# Patient Record
Sex: Female | Born: 1935 | Race: White | Hispanic: No | Smoking: Current some day smoker
Health system: Southern US, Community
[De-identification: ages and names within clinical notes are randomized; demographics above are authoritative.]

## PROBLEM LIST (undated history)

## (undated) DIAGNOSIS — E785 Hyperlipidemia, unspecified: Secondary | ICD-10-CM

## (undated) DIAGNOSIS — I639 Cerebral infarction, unspecified: Secondary | ICD-10-CM

## (undated) DIAGNOSIS — E039 Hypothyroidism, unspecified: Secondary | ICD-10-CM

## (undated) HISTORY — PX: ABDOMINAL HYSTERECTOMY: SUR658

## (undated) HISTORY — DX: Cerebral infarction, unspecified: I63.9

## (undated) HISTORY — PX: CHOLECYSTECTOMY: SHX55

## (undated) HISTORY — PX: APPENDECTOMY: SHX54

---

## 1998-08-05 ENCOUNTER — Ambulatory Visit (HOSPITAL_COMMUNITY): Admission: RE | Admit: 1998-08-05 | Discharge: 1998-08-05 | Payer: Self-pay

## 1999-03-22 ENCOUNTER — Other Ambulatory Visit: Admission: RE | Admit: 1999-03-22 | Discharge: 1999-03-22 | Payer: Self-pay | Admitting: Internal Medicine

## 1999-08-19 ENCOUNTER — Encounter: Payer: Self-pay | Admitting: Internal Medicine

## 1999-08-19 ENCOUNTER — Ambulatory Visit (HOSPITAL_COMMUNITY): Admission: RE | Admit: 1999-08-19 | Discharge: 1999-08-19 | Payer: Self-pay | Admitting: Internal Medicine

## 2000-03-29 ENCOUNTER — Other Ambulatory Visit: Admission: RE | Admit: 2000-03-29 | Discharge: 2000-03-29 | Payer: Self-pay | Admitting: *Deleted

## 2000-08-21 ENCOUNTER — Ambulatory Visit (HOSPITAL_COMMUNITY): Admission: RE | Admit: 2000-08-21 | Discharge: 2000-08-21 | Payer: Self-pay | Admitting: *Deleted

## 2001-06-29 ENCOUNTER — Encounter (INDEPENDENT_AMBULATORY_CARE_PROVIDER_SITE_OTHER): Payer: Self-pay | Admitting: Specialist

## 2001-06-29 ENCOUNTER — Ambulatory Visit (HOSPITAL_COMMUNITY): Admission: RE | Admit: 2001-06-29 | Discharge: 2001-06-29 | Payer: Self-pay | Admitting: *Deleted

## 2001-09-25 ENCOUNTER — Ambulatory Visit (HOSPITAL_COMMUNITY): Admission: RE | Admit: 2001-09-25 | Discharge: 2001-09-25 | Payer: Self-pay | Admitting: *Deleted

## 2002-01-27 ENCOUNTER — Emergency Department (HOSPITAL_COMMUNITY): Admission: EM | Admit: 2002-01-27 | Discharge: 2002-01-27 | Payer: Self-pay | Admitting: Emergency Medicine

## 2002-01-27 ENCOUNTER — Encounter: Payer: Self-pay | Admitting: Emergency Medicine

## 2002-10-02 ENCOUNTER — Encounter: Payer: Self-pay | Admitting: Family Medicine

## 2002-10-02 ENCOUNTER — Ambulatory Visit (HOSPITAL_COMMUNITY): Admission: RE | Admit: 2002-10-02 | Discharge: 2002-10-02 | Payer: Self-pay | Admitting: Family Medicine

## 2003-10-20 ENCOUNTER — Ambulatory Visit (HOSPITAL_COMMUNITY): Admission: RE | Admit: 2003-10-20 | Discharge: 2003-10-20 | Payer: Self-pay | Admitting: Family Medicine

## 2004-10-25 ENCOUNTER — Ambulatory Visit (HOSPITAL_COMMUNITY): Admission: RE | Admit: 2004-10-25 | Discharge: 2004-10-25 | Payer: Self-pay | Admitting: Family Medicine

## 2005-03-11 ENCOUNTER — Emergency Department (HOSPITAL_COMMUNITY): Admission: EM | Admit: 2005-03-11 | Discharge: 2005-03-11 | Payer: Self-pay | Admitting: Emergency Medicine

## 2005-11-04 ENCOUNTER — Ambulatory Visit (HOSPITAL_COMMUNITY): Admission: RE | Admit: 2005-11-04 | Discharge: 2005-11-04 | Payer: Self-pay | Admitting: Family Medicine

## 2006-10-22 ENCOUNTER — Emergency Department (HOSPITAL_COMMUNITY): Admission: EM | Admit: 2006-10-22 | Discharge: 2006-10-22 | Payer: Self-pay | Admitting: Emergency Medicine

## 2006-12-13 ENCOUNTER — Ambulatory Visit (HOSPITAL_COMMUNITY): Admission: RE | Admit: 2006-12-13 | Discharge: 2006-12-13 | Payer: Self-pay | Admitting: Family Medicine

## 2008-01-04 ENCOUNTER — Ambulatory Visit (HOSPITAL_COMMUNITY): Admission: RE | Admit: 2008-01-04 | Discharge: 2008-01-04 | Payer: Self-pay | Admitting: Family Medicine

## 2009-01-15 ENCOUNTER — Ambulatory Visit (HOSPITAL_COMMUNITY): Admission: RE | Admit: 2009-01-15 | Discharge: 2009-01-15 | Payer: Self-pay | Admitting: Family Medicine

## 2010-02-01 ENCOUNTER — Ambulatory Visit (HOSPITAL_COMMUNITY): Admission: RE | Admit: 2010-02-01 | Discharge: 2010-02-01 | Payer: Self-pay | Admitting: Family Medicine

## 2010-12-19 ENCOUNTER — Encounter: Payer: Self-pay | Admitting: Family Medicine

## 2011-02-11 ENCOUNTER — Other Ambulatory Visit (HOSPITAL_COMMUNITY): Payer: Self-pay | Admitting: Family Medicine

## 2011-04-15 NOTE — Procedures (Signed)
[facility]  Patient:    Tiffany Thornton, Tiffany Thornton                         MRN: [id number] Proc. Date: 06/29/01 Adm. Date:  [id number] Attending:  Sabino Gasser                           Procedure Report  PROCEDURE:  Colonoscopy.  INDICATIONS:  Rectal bleeding, colon polyps.  ANESTHESIA:  Demerol 25, Versed 2 mg additionally.  DESCRIPTION OF PROCEDURE:  With the patient mildly sedated in the left lateral decubitus position, the Olympus videoscopic colonoscope was inserted in the rectum after rectal exam and passed under direct vision to the cecum, identified by the ileocecal valve and appendiceal orifice, both of which were photographed.  The prep was slightly suboptimal in that the cecum was not well seen because of fecal debris that had to be cleared, and it was adherent to the colonic mucosa.  From this point, the colonoscope was slowly withdrawn taking circumferential views of the entire colonic mucosa stopping only then in the sigmoid area where small polyps were seen, photographed, and removed using hot biopsy forceps technique and all placed in one vial.  This was done until we reached the rectum which appeared normal on direct view and showed internal hemorrhoids on retroflexed view.  The endoscope was straightened and withdrawn.  The patients vital signs and pulse oximeter remained stable.  The patient tolerated the procedure well without apparent complications.  FINDINGS:  Polyps of sigmoid and internal hemorrhoids.  PLAN:  Await biopsy report.  The patient will call me for results and follow up with me as an outpatient. DD:  06/29/01 TD:  06/29/01 Job: [id number]

## 2011-04-15 NOTE — Procedures (Signed)
[facility]  Patient:    Tiffany Thornton, Tiffany Thornton                         MRN: [id number] Proc. Date: 06/29/01 Adm. Date:  [id number] Attending:  Sabino Gasser                           Procedure Report  PROCEDURE:  Upper endoscopy.  INDICATIONS:  Rectal bleeding.  ANESTHESIA:  Demerol 40 mg, Versed 4 mg.  DESCRIPTION OF PROCEDURE:  With the patient mildly sedated in the left lateral decubitus position, the Olympus videoscopic endoscope was inserted into the mouth and passed under direct vision through the esophagus.  The distal esophagus was approached and appeared normal.  There was a hiatal hernia seen and photographed.  We entered into the stomach.  The fundus, body, antrum, duodenal bulb, second portion of duodenum were all well visualized.  From this point, the endoscope was slowly withdrawn, taking circumferential views of the entire duodenal mucosa until the endoscope then pulled back in the stomach and placed in retroflexion to view the stomach from below, and a hiatal hernia was once again visualized and photographed.  The endoscope was straightened and withdrawn, taking circumferential views of the remaining gastric and esophageal mucosa, stopping only in the stomach where changes of gastritis were seen, photographed, and biopsied.  The endoscope was then withdrawn.  The patients vital signs and pulse oximeter remained stable.  The patient tolerated the procedure well without apparent complications.  FINDINGS:  Changes of gastritis, biopsied.  Await biopsy report.  The patient will call me for results and follow up with me as an outpatient.  PLAN:  Proceed to colonoscopy as planned. DD:  06/29/01 TD:  06/29/01 Job: [id number]

## 2016-01-12 ENCOUNTER — Other Ambulatory Visit: Payer: Self-pay | Admitting: Family Medicine

## 2016-01-12 ENCOUNTER — Ambulatory Visit
Admission: RE | Admit: 2016-01-12 | Discharge: 2016-01-12 | Disposition: A | Discharge status: Home / Self Care | Payer: Self-pay | Source: Ambulatory Visit | Attending: Family Medicine | Admitting: Family Medicine

## 2016-01-12 DIAGNOSIS — R52 Pain, unspecified: Secondary | ICD-10-CM

## 2018-04-30 ENCOUNTER — Emergency Department (HOSPITAL_COMMUNITY): Payer: Medicare Other | Admitting: Certified Registered Nurse Anesthetist

## 2018-04-30 ENCOUNTER — Inpatient Hospital Stay (HOSPITAL_COMMUNITY)
Admission: EM | Disposition: A | Discharge status: Rehabilitation Facility | Payer: Self-pay | Source: Home / Self Care | Attending: Neurosurgery

## 2018-04-30 ENCOUNTER — Emergency Department (HOSPITAL_COMMUNITY): Payer: Medicare Other

## 2018-04-30 ENCOUNTER — Inpatient Hospital Stay (HOSPITAL_COMMUNITY)
Admission: EM | Admit: 2018-04-30 | Discharge: 2018-05-07 | DRG: 023 | Disposition: A | Discharge status: Rehabilitation Facility | Payer: Medicare Other | Attending: Neurosurgery | Admitting: Neurosurgery

## 2018-04-30 DIAGNOSIS — I69393 Ataxia following cerebral infarction: Secondary | ICD-10-CM

## 2018-04-30 DIAGNOSIS — E875 Hyperkalemia: Secondary | ICD-10-CM | POA: Diagnosis present

## 2018-04-30 DIAGNOSIS — I614 Nontraumatic intracerebral hemorrhage in cerebellum: Secondary | ICD-10-CM | POA: Diagnosis present

## 2018-04-30 DIAGNOSIS — G935 Compression of brain: Secondary | ICD-10-CM | POA: Diagnosis present

## 2018-04-30 DIAGNOSIS — R7303 Prediabetes: Secondary | ICD-10-CM

## 2018-04-30 DIAGNOSIS — D72829 Elevated white blood cell count, unspecified: Secondary | ICD-10-CM | POA: Diagnosis present

## 2018-04-30 DIAGNOSIS — R42 Dizziness and giddiness: Secondary | ICD-10-CM | POA: Diagnosis not present

## 2018-04-30 DIAGNOSIS — Z7982 Long term (current) use of aspirin: Secondary | ICD-10-CM | POA: Diagnosis not present

## 2018-04-30 DIAGNOSIS — I160 Hypertensive urgency: Secondary | ICD-10-CM | POA: Diagnosis present

## 2018-04-30 DIAGNOSIS — E785 Hyperlipidemia, unspecified: Secondary | ICD-10-CM | POA: Diagnosis present

## 2018-04-30 DIAGNOSIS — Z789 Other specified health status: Secondary | ICD-10-CM | POA: Diagnosis not present

## 2018-04-30 DIAGNOSIS — Z79890 Hormone replacement therapy (postmenopausal): Secondary | ICD-10-CM

## 2018-04-30 DIAGNOSIS — Z79899 Other long term (current) drug therapy: Secondary | ICD-10-CM

## 2018-04-30 DIAGNOSIS — E878 Other disorders of electrolyte and fluid balance, not elsewhere classified: Secondary | ICD-10-CM | POA: Diagnosis present

## 2018-04-30 DIAGNOSIS — Z7189 Other specified counseling: Secondary | ICD-10-CM | POA: Diagnosis not present

## 2018-04-30 DIAGNOSIS — Z9889 Other specified postprocedural states: Secondary | ICD-10-CM | POA: Diagnosis not present

## 2018-04-30 DIAGNOSIS — G9340 Encephalopathy, unspecified: Secondary | ICD-10-CM | POA: Diagnosis not present

## 2018-04-30 DIAGNOSIS — E039 Hypothyroidism, unspecified: Secondary | ICD-10-CM | POA: Diagnosis present

## 2018-04-30 DIAGNOSIS — J9601 Acute respiratory failure with hypoxia: Secondary | ICD-10-CM | POA: Diagnosis not present

## 2018-04-30 DIAGNOSIS — H8149 Vertigo of central origin, unspecified ear: Secondary | ICD-10-CM | POA: Diagnosis not present

## 2018-04-30 DIAGNOSIS — I161 Hypertensive emergency: Secondary | ICD-10-CM | POA: Diagnosis present

## 2018-04-30 DIAGNOSIS — G936 Cerebral edema: Secondary | ICD-10-CM | POA: Diagnosis not present

## 2018-04-30 DIAGNOSIS — Z9911 Dependence on respirator [ventilator] status: Secondary | ICD-10-CM | POA: Diagnosis not present

## 2018-04-30 DIAGNOSIS — I10 Essential (primary) hypertension: Secondary | ICD-10-CM | POA: Diagnosis present

## 2018-04-30 DIAGNOSIS — I671 Cerebral aneurysm, nonruptured: Secondary | ICD-10-CM | POA: Diagnosis present

## 2018-04-30 DIAGNOSIS — R0989 Other specified symptoms and signs involving the circulatory and respiratory systems: Secondary | ICD-10-CM | POA: Diagnosis not present

## 2018-04-30 DIAGNOSIS — I69319 Unspecified symptoms and signs involving cognitive functions following cerebral infarction: Secondary | ICD-10-CM | POA: Diagnosis not present

## 2018-04-30 DIAGNOSIS — R270 Ataxia, unspecified: Secondary | ICD-10-CM | POA: Diagnosis present

## 2018-04-30 DIAGNOSIS — R112 Nausea with vomiting, unspecified: Secondary | ICD-10-CM | POA: Diagnosis not present

## 2018-04-30 DIAGNOSIS — E876 Hypokalemia: Secondary | ICD-10-CM | POA: Diagnosis present

## 2018-04-30 DIAGNOSIS — J95821 Acute postprocedural respiratory failure: Secondary | ICD-10-CM | POA: Diagnosis not present

## 2018-04-30 HISTORY — DX: Hypothyroidism, unspecified: E03.9

## 2018-04-30 HISTORY — PX: CRANIOTOMY: SHX93

## 2018-04-30 HISTORY — DX: Hyperlipidemia, unspecified: E78.5

## 2018-04-30 LAB — CBC WITH DIFFERENTIAL/PLATELET
Abs Immature Granulocytes: 0.1 10*3/uL (ref 0.0–0.1)
BASOS PCT: 1 %
Basophils Absolute: 0.1 10*3/uL (ref 0.0–0.1)
EOS ABS: 0.1 10*3/uL (ref 0.0–0.7)
Eosinophils Relative: 1 %
HEMATOCRIT: 42.8 % (ref 36.0–46.0)
Hemoglobin: 14.2 g/dL (ref 12.0–15.0)
IMMATURE GRANULOCYTES: 0 %
LYMPHS ABS: 1.5 10*3/uL (ref 0.7–4.0)
Lymphocytes Relative: 12 %
MCH: 31.4 pg (ref 26.0–34.0)
MCHC: 33.2 g/dL (ref 30.0–36.0)
MCV: 94.7 fL (ref 78.0–100.0)
MONOS PCT: 8 %
Monocytes Absolute: 1 10*3/uL (ref 0.1–1.0)
NEUTROS PCT: 78 %
Neutro Abs: 9.7 10*3/uL — ABNORMAL HIGH (ref 1.7–7.7)
PLATELETS: 326 10*3/uL (ref 150–400)
RBC: 4.52 MIL/uL (ref 3.87–5.11)
RDW: 13.2 % (ref 11.5–15.5)
WBC: 12.5 10*3/uL — ABNORMAL HIGH (ref 4.0–10.5)

## 2018-04-30 LAB — I-STAT TROPONIN, ED: Troponin i, poc: 0.01 ng/mL (ref 0.00–0.08)

## 2018-04-30 LAB — I-STAT CHEM 8, ED
BUN: 20 mg/dL (ref 6–20)
CHLORIDE: 101 mmol/L (ref 101–111)
CREATININE: 0.8 mg/dL (ref 0.44–1.00)
Calcium, Ion: 1.18 mmol/L (ref 1.15–1.40)
GLUCOSE: 178 mg/dL — AB (ref 65–99)
HCT: 45 % (ref 36.0–46.0)
Hemoglobin: 15.3 g/dL — ABNORMAL HIGH (ref 12.0–15.0)
POTASSIUM: 3.7 mmol/L (ref 3.5–5.1)
Sodium: 137 mmol/L (ref 135–145)
TCO2: 24 mmol/L (ref 22–32)

## 2018-04-30 LAB — CBG MONITORING, ED: Glucose-Capillary: 174 mg/dL — ABNORMAL HIGH (ref 65–99)

## 2018-04-30 LAB — TRIGLYCERIDES: TRIGLYCERIDES: 63 mg/dL (ref <150)

## 2018-04-30 LAB — TSH: TSH: 3.891 u[IU]/mL (ref 0.350–4.500)

## 2018-04-30 LAB — LIPASE, BLOOD: LIPASE: 49 U/L (ref 11–51)

## 2018-04-30 LAB — BASIC METABOLIC PANEL
Anion gap: 9 (ref 5–15)
BUN: 16 mg/dL (ref 6–20)
CALCIUM: 8.1 mg/dL — AB (ref 8.9–10.3)
CHLORIDE: 106 mmol/L (ref 101–111)
CO2: 20 mmol/L — ABNORMAL LOW (ref 22–32)
CREATININE: 0.74 mg/dL (ref 0.44–1.00)
Glucose, Bld: 200 mg/dL — ABNORMAL HIGH (ref 65–99)
Potassium: 4 mmol/L (ref 3.5–5.1)
Sodium: 135 mmol/L (ref 135–145)

## 2018-04-30 LAB — URINALYSIS, ROUTINE W REFLEX MICROSCOPIC
BILIRUBIN URINE: NEGATIVE
Glucose, UA: NEGATIVE mg/dL
HGB URINE DIPSTICK: NEGATIVE
Ketones, ur: NEGATIVE mg/dL
Leukocytes, UA: NEGATIVE
NITRITE: NEGATIVE
Protein, ur: 100 mg/dL — AB
SPECIFIC GRAVITY, URINE: 1.006 (ref 1.005–1.030)
pH: 7 (ref 5.0–8.0)

## 2018-04-30 LAB — COMPREHENSIVE METABOLIC PANEL
ALT: 18 U/L (ref 14–54)
AST: 28 U/L (ref 15–41)
Albumin: 4.2 g/dL (ref 3.5–5.0)
Alkaline Phosphatase: 62 U/L (ref 38–126)
Anion gap: 11 (ref 5–15)
BILIRUBIN TOTAL: 0.4 mg/dL (ref 0.3–1.2)
BUN: 16 mg/dL (ref 6–20)
CO2: 24 mmol/L (ref 22–32)
CREATININE: 1.01 mg/dL — AB (ref 0.44–1.00)
Calcium: 9.5 mg/dL (ref 8.9–10.3)
Chloride: 102 mmol/L (ref 101–111)
GFR calc non Af Amer: 50 mL/min — ABNORMAL LOW (ref >60)
GFR, EST AFRICAN AMERICAN: 58 mL/min — AB (ref >60)
Glucose, Bld: 181 mg/dL — ABNORMAL HIGH (ref 65–99)
POTASSIUM: 3.8 mmol/L (ref 3.5–5.1)
Sodium: 137 mmol/L (ref 135–145)
TOTAL PROTEIN: 7.2 g/dL (ref 6.5–8.1)

## 2018-04-30 LAB — POCT I-STAT 3, ART BLOOD GAS (G3+)
Acid-base deficit: 4 mmol/L — ABNORMAL HIGH (ref 0.0–2.0)
BICARBONATE: 20.9 mmol/L (ref 20.0–28.0)
O2 SAT: 100 %
PCO2 ART: 38.4 mmHg (ref 32.0–48.0)
TCO2: 22 mmol/L (ref 22–32)
pH, Arterial: 7.343 — ABNORMAL LOW (ref 7.350–7.450)
pO2, Arterial: 208 mmHg — ABNORMAL HIGH (ref 83.0–108.0)

## 2018-04-30 LAB — I-STAT CG4 LACTIC ACID, ED: Lactic Acid, Venous: 2.01 mmol/L (ref 0.5–1.9)

## 2018-04-30 LAB — CBC
HCT: 38.6 % (ref 36.0–46.0)
Hemoglobin: 12.8 g/dL (ref 12.0–15.0)
MCH: 30.9 pg (ref 26.0–34.0)
MCHC: 33.2 g/dL (ref 30.0–36.0)
MCV: 93.2 fL (ref 78.0–100.0)
PLATELETS: 300 10*3/uL (ref 150–400)
RBC: 4.14 MIL/uL (ref 3.87–5.11)
RDW: 13.2 % (ref 11.5–15.5)
WBC: 11 10*3/uL — AB (ref 4.0–10.5)

## 2018-04-30 LAB — POCT I-STAT 7, (LYTES, BLD GAS, ICA,H+H)
ACID-BASE DEFICIT: 4 mmol/L — AB (ref 0.0–2.0)
BICARBONATE: 21.7 mmol/L (ref 20.0–28.0)
Calcium, Ion: 1.11 mmol/L — ABNORMAL LOW (ref 1.15–1.40)
HCT: 34 % — ABNORMAL LOW (ref 36.0–46.0)
Hemoglobin: 11.6 g/dL — ABNORMAL LOW (ref 12.0–15.0)
O2 Saturation: 100 %
PH ART: 7.337 — AB (ref 7.350–7.450)
PO2 ART: 269 mmHg — AB (ref 83.0–108.0)
Potassium: 3.9 mmol/L (ref 3.5–5.1)
Sodium: 138 mmol/L (ref 135–145)
TCO2: 23 mmol/L (ref 22–32)
pCO2 arterial: 40.5 mmHg (ref 32.0–48.0)

## 2018-04-30 LAB — MRSA PCR SCREENING: MRSA by PCR: NEGATIVE

## 2018-04-30 LAB — PROTIME-INR
INR: 0.86
Prothrombin Time: 11.6 seconds (ref 11.4–15.2)

## 2018-04-30 LAB — AMMONIA: Ammonia: 31 umol/L (ref 9–35)

## 2018-04-30 SURGERY — CRANIOTOMY HEMATOMA EVACUATION SUBDURAL
Anesthesia: General

## 2018-04-30 MED ORDER — ETOMIDATE 2 MG/ML IV SOLN
INTRAVENOUS | Status: AC | PRN
  Administered 2018-04-30: 20 mg via INTRAVENOUS

## 2018-04-30 MED ORDER — BACITRACIN ZINC 500 UNIT/GM EX OINT
TOPICAL_OINTMENT | CUTANEOUS | Status: DC | PRN
  Administered 2018-04-30: 1 via TOPICAL

## 2018-04-30 MED ORDER — THROMBIN 20000 UNITS EX SOLR
CUTANEOUS | Status: AC
  Filled 2018-04-30: qty 20000

## 2018-04-30 MED ORDER — DEXAMETHASONE SODIUM PHOSPHATE 10 MG/ML IJ SOLN
INTRAMUSCULAR | Status: DC | PRN
  Administered 2018-04-30: 10 mg via INTRAVENOUS

## 2018-04-30 MED ORDER — SODIUM CHLORIDE 0.9 % IV SOLN
INTRAVENOUS | Status: DC | PRN
  Administered 2018-04-30: 10:00:00

## 2018-04-30 MED ORDER — MIDAZOLAM HCL 2 MG/2ML IJ SOLN
INTRAMUSCULAR | Status: AC | PRN
  Administered 2018-04-30: 1 mg via INTRAVENOUS

## 2018-04-30 MED ORDER — THROMBIN 20000 UNITS EX SOLR
CUTANEOUS | Status: DC | PRN
  Administered 2018-04-30: 20000 [IU] via TOPICAL

## 2018-04-30 MED ORDER — NALOXONE HCL 0.4 MG/ML IJ SOLN: 0.0800 mg | INTRAMUSCULAR | Status: DC | PRN

## 2018-04-30 MED ORDER — PROPOFOL 500 MG/50ML IV EMUL
INTRAVENOUS | Status: DC | PRN
  Administered 2018-04-30: 25 ug/kg/min via INTRAVENOUS

## 2018-04-30 MED ORDER — SODIUM CHLORIDE 0.9 % IV SOLN
INTRAVENOUS | Status: DC | PRN
  Administered 2018-04-30 (×2): via INTRAVENOUS

## 2018-04-30 MED ORDER — ACETAMINOPHEN 650 MG RE SUPP: 650.0000 mg | RECTAL | Status: DC | PRN

## 2018-04-30 MED ORDER — CHLORHEXIDINE GLUCONATE 0.12 % MT SOLN
OROMUCOSAL | Status: AC
  Administered 2018-04-30: 15 mL
  Filled 2018-04-30: qty 15

## 2018-04-30 MED ORDER — FAMOTIDINE IN NACL 20-0.9 MG/50ML-% IV SOLN
20.0000 mg | Freq: Two times a day (BID) | INTRAVENOUS | Status: DC
  Administered 2018-04-30 – 2018-05-01 (×4): 20 mg via INTRAVENOUS
  Filled 2018-04-30 (×4): qty 50

## 2018-04-30 MED ORDER — BACITRACIN ZINC 500 UNIT/GM EX OINT
TOPICAL_OINTMENT | CUTANEOUS | Status: AC
  Filled 2018-04-30: qty 28.35

## 2018-04-30 MED ORDER — LABETALOL HCL 5 MG/ML IV SOLN
10.0000 mg | INTRAVENOUS | Status: DC | PRN
  Administered 2018-04-30 – 2018-05-01 (×3): 20 mg via INTRAVENOUS
  Administered 2018-05-01: 40 mg via INTRAVENOUS
  Administered 2018-05-02 (×2): 10 mg via INTRAVENOUS
  Filled 2018-04-30 (×4): qty 4
  Filled 2018-04-30: qty 8
  Filled 2018-04-30 (×2): qty 4

## 2018-04-30 MED ORDER — BUPIVACAINE HCL (PF) 0.25 % IJ SOLN
INTRAMUSCULAR | Status: AC
  Filled 2018-04-30: qty 30

## 2018-04-30 MED ORDER — FENTANYL CITRATE (PF) 100 MCG/2ML IJ SOLN
INTRAMUSCULAR | Status: AC
  Filled 2018-04-30: qty 2

## 2018-04-30 MED ORDER — ACETAMINOPHEN 325 MG PO TABS
650.0000 mg | ORAL_TABLET | ORAL | Status: DC | PRN
  Administered 2018-05-03: 650 mg via ORAL
  Filled 2018-04-30: qty 2

## 2018-04-30 MED ORDER — ROCURONIUM BROMIDE 100 MG/10ML IV SOLN
INTRAVENOUS | Status: DC | PRN
  Administered 2018-04-30: 20 mg via INTRAVENOUS
  Administered 2018-04-30: 10 mg via INTRAVENOUS
  Administered 2018-04-30: 50 mg via INTRAVENOUS

## 2018-04-30 MED ORDER — NICARDIPINE HCL IN NACL 20-0.86 MG/200ML-% IV SOLN
3.0000 mg/h | INTRAVENOUS | Status: DC
Start: 2018-04-30 — End: 2018-05-01
  Administered 2018-04-30 (×2): 5 mg/h via INTRAVENOUS
  Administered 2018-04-30: 8 mg/h via INTRAVENOUS
  Administered 2018-04-30: 9 mg/h via INTRAVENOUS
  Administered 2018-04-30: 11 mg/h via INTRAVENOUS
  Administered 2018-05-01: 8 mg/h via INTRAVENOUS
  Administered 2018-05-01: 5 mg/h via INTRAVENOUS
  Administered 2018-05-01: 9 mg/h via INTRAVENOUS
  Administered 2018-05-01: 7.5 mg/h via INTRAVENOUS
  Filled 2018-04-30 (×4): qty 200
  Filled 2018-04-30: qty 400
  Filled 2018-04-30 (×3): qty 200

## 2018-04-30 MED ORDER — MIDAZOLAM HCL 2 MG/2ML IJ SOLN
INTRAMUSCULAR | Status: AC
  Filled 2018-04-30: qty 2

## 2018-04-30 MED ORDER — FENTANYL CITRATE (PF) 250 MCG/5ML IJ SOLN
INTRAMUSCULAR | Status: DC | PRN
  Administered 2018-04-30: 50 ug via INTRAVENOUS
  Administered 2018-04-30: 200 ug via INTRAVENOUS

## 2018-04-30 MED ORDER — EPHEDRINE SULFATE 50 MG/ML IJ SOLN
INTRAMUSCULAR | Status: AC
  Filled 2018-04-30: qty 1

## 2018-04-30 MED ORDER — PROMETHAZINE HCL 25 MG PO TABS
12.5000 mg | ORAL_TABLET | ORAL | Status: DC | PRN
  Administered 2018-05-04 (×2): 25 mg via ORAL
  Filled 2018-04-30 (×2): qty 1

## 2018-04-30 MED ORDER — FENTANYL CITRATE (PF) 250 MCG/5ML IJ SOLN
INTRAMUSCULAR | Status: AC
  Filled 2018-04-30: qty 5

## 2018-04-30 MED ORDER — THROMBIN 5000 UNITS EX SOLR
OROMUCOSAL | Status: DC | PRN
  Administered 2018-04-30 (×2): via TOPICAL

## 2018-04-30 MED ORDER — ONDANSETRON HCL 4 MG PO TABS: 4.0000 mg | ORAL_TABLET | ORAL | Status: DC | PRN

## 2018-04-30 MED ORDER — GLYCOPYRROLATE 0.2 MG/ML IJ SOLN
INTRAMUSCULAR | Status: DC | PRN
  Administered 2018-04-30: 0.2 mg via INTRAVENOUS

## 2018-04-30 MED ORDER — CHLORHEXIDINE GLUCONATE 0.12% ORAL RINSE (MEDLINE KIT)
15.0000 mL | Freq: Two times a day (BID) | OROMUCOSAL | Status: DC
  Administered 2018-04-30 – 2018-05-01 (×3): 15 mL via OROMUCOSAL

## 2018-04-30 MED ORDER — THROMBIN 5000 UNITS EX SOLR
CUTANEOUS | Status: AC
  Filled 2018-04-30: qty 5000

## 2018-04-30 MED ORDER — SODIUM CHLORIDE 0.9 % IV SOLN
INTRAVENOUS | Status: DC | PRN
  Administered 2018-04-30: 09:00:00 via INTRAVENOUS

## 2018-04-30 MED ORDER — FENTANYL CITRATE (PF) 100 MCG/2ML IJ SOLN
INTRAMUSCULAR | Status: AC | PRN
  Administered 2018-04-30: 50 ug via INTRAVENOUS

## 2018-04-30 MED ORDER — PROPOFOL 10 MG/ML IV BOLUS
INTRAVENOUS | Status: DC | PRN
  Administered 2018-04-30: 100 mg via INTRAVENOUS

## 2018-04-30 MED ORDER — ORAL CARE MOUTH RINSE
15.0000 mL | OROMUCOSAL | Status: DC
  Administered 2018-04-30 – 2018-05-01 (×8): 15 mL via OROMUCOSAL

## 2018-04-30 MED ORDER — PROPOFOL 1000 MG/100ML IV EMUL
0.0000 ug/kg/min | INTRAVENOUS | Status: DC
  Administered 2018-04-30 – 2018-05-01 (×3): 50 ug/kg/min via INTRAVENOUS
  Filled 2018-04-30 (×3): qty 100

## 2018-04-30 MED ORDER — FENTANYL CITRATE (PF) 100 MCG/2ML IJ SOLN
50.0000 ug | INTRAMUSCULAR | Status: DC | PRN
  Administered 2018-04-30 – 2018-05-01 (×4): 50 ug via INTRAVENOUS
  Filled 2018-04-30 (×4): qty 2

## 2018-04-30 MED ORDER — NICARDIPINE HCL IN NACL 20-0.86 MG/200ML-% IV SOLN
INTRAVENOUS | Status: DC | PRN
  Administered 2018-04-30: 5 mg/h via INTRAVENOUS

## 2018-04-30 MED ORDER — PHENYLEPHRINE 40 MCG/ML (10ML) SYRINGE FOR IV PUSH (FOR BLOOD PRESSURE SUPPORT)
PREFILLED_SYRINGE | INTRAVENOUS | Status: AC
  Filled 2018-04-30: qty 10

## 2018-04-30 MED ORDER — PROPOFOL 1000 MG/100ML IV EMUL
INTRAVENOUS | Status: AC
  Filled 2018-04-30: qty 100

## 2018-04-30 MED ORDER — NICARDIPINE HCL IN NACL 20-0.86 MG/200ML-% IV SOLN
3.0000 mg/h | Freq: Once | INTRAVENOUS | Status: AC
  Administered 2018-04-30: 5 mg/h via INTRAVENOUS
  Filled 2018-04-30: qty 200

## 2018-04-30 MED ORDER — SODIUM CHLORIDE 0.9 % IJ SOLN
INTRAMUSCULAR | Status: AC
  Filled 2018-04-30: qty 10

## 2018-04-30 MED ORDER — POTASSIUM CHLORIDE IN NACL 20-0.9 MEQ/L-% IV SOLN
INTRAVENOUS | Status: DC
  Administered 2018-04-30 – 2018-05-01 (×2): via INTRAVENOUS
  Filled 2018-04-30 (×3): qty 1000

## 2018-04-30 MED ORDER — SUCCINYLCHOLINE CHLORIDE 20 MG/ML IJ SOLN
INTRAMUSCULAR | Status: AC | PRN
  Administered 2018-04-30: 100 mg via INTRAVENOUS

## 2018-04-30 MED ORDER — HYDROMORPHONE HCL 1 MG/ML IJ SOLN: 0.5000 mg | INTRAMUSCULAR | Status: DC | PRN

## 2018-04-30 MED ORDER — MIDAZOLAM HCL 2 MG/2ML IJ SOLN
1.0000 mg | INTRAMUSCULAR | Status: DC | PRN
  Administered 2018-04-30 (×2): 1 mg via INTRAVENOUS

## 2018-04-30 MED ORDER — FENTANYL CITRATE (PF) 100 MCG/2ML IJ SOLN
50.0000 ug | INTRAMUSCULAR | Status: DC | PRN
  Administered 2018-04-30 (×2): 50 ug via INTRAVENOUS

## 2018-04-30 MED ORDER — FENTANYL CITRATE (PF) 100 MCG/2ML IJ SOLN: 50.0000 ug | INTRAMUSCULAR | Status: DC | PRN

## 2018-04-30 MED ORDER — MIDAZOLAM HCL 2 MG/2ML IJ SOLN: 1.0000 mg | INTRAMUSCULAR | Status: DC | PRN

## 2018-04-30 MED ORDER — HEMOSTATIC AGENTS (NO CHARGE) OPTIME
TOPICAL | Status: DC | PRN
  Administered 2018-04-30: 1 via TOPICAL

## 2018-04-30 MED ORDER — PHENYLEPHRINE HCL 10 MG/ML IJ SOLN: INTRAVENOUS | Status: DC | PRN

## 2018-04-30 MED ORDER — CEFAZOLIN SODIUM-DEXTROSE 1-4 GM/50ML-% IV SOLN
1.0000 g | Freq: Three times a day (TID) | INTRAVENOUS | Status: AC
  Administered 2018-04-30 – 2018-05-01 (×2): 1 g via INTRAVENOUS
  Filled 2018-04-30 (×2): qty 50

## 2018-04-30 MED ORDER — 0.9 % SODIUM CHLORIDE (POUR BTL) OPTIME
TOPICAL | Status: DC | PRN
  Administered 2018-04-30 (×3): 1000 mL

## 2018-04-30 MED ORDER — ESMOLOL HCL 100 MG/10ML IV SOLN
INTRAVENOUS | Status: DC | PRN
  Administered 2018-04-30: 30 mg via INTRAVENOUS
  Administered 2018-04-30: 50 mg via INTRAVENOUS

## 2018-04-30 MED ORDER — MICROFIBRILLAR COLL HEMOSTAT EX PADS
MEDICATED_PAD | CUTANEOUS | Status: DC | PRN
  Administered 2018-04-30: 1 via TOPICAL

## 2018-04-30 MED ORDER — ONDANSETRON HCL 4 MG/2ML IJ SOLN
4.0000 mg | INTRAMUSCULAR | Status: DC | PRN
  Administered 2018-05-01 – 2018-05-06 (×11): 4 mg via INTRAVENOUS
  Filled 2018-04-30 (×12): qty 2

## 2018-04-30 MED ORDER — PHENYLEPHRINE HCL 10 MG/ML IJ SOLN
INTRAVENOUS | Status: DC | PRN
  Administered 2018-04-30: 20 ug/min via INTRAVENOUS

## 2018-04-30 MED ORDER — CEFAZOLIN SODIUM-DEXTROSE 2-3 GM-%(50ML) IV SOLR
INTRAVENOUS | Status: DC | PRN
  Administered 2018-04-30: 2 g via INTRAVENOUS

## 2018-04-30 MED ORDER — PROPOFOL 10 MG/ML IV BOLUS
INTRAVENOUS | Status: AC
  Filled 2018-04-30: qty 20

## 2018-04-30 SURGICAL SUPPLY — 72 items
ADH SKN CLS APL DERMABOND .7 (GAUZE/BANDAGES/DRESSINGS) ×1
BAG DECANTER FOR FLEXI CONT (MISCELLANEOUS) ×3 IMPLANT
BANDAGE GAUZE 4  KLING STR (GAUZE/BANDAGES/DRESSINGS) IMPLANT
BIT DRILL WIRE PASS 1.3MM (BIT) ×1 IMPLANT
BLADE SURG 11 STRL SS (BLADE) IMPLANT
BNDG COHESIVE 4X5 TAN NS LF (GAUZE/BANDAGES/DRESSINGS) IMPLANT
BUR ACORN 6.0 PRECISION (BURR) ×2 IMPLANT
BUR ACORN 6.0MM PRECISION (BURR) ×1
BUR ACORN 9.0 PRECISION (BURR) ×1 IMPLANT
BUR ACORN 9.0MM PRECISION (BURR) ×1
BUR SPIRAL ROUTER 2.3 (BUR) IMPLANT
BUR SPIRAL ROUTER 2.3MM (BUR)
CANISTER SUCT 3000ML PPV (MISCELLANEOUS) ×3 IMPLANT
CARTRIDGE OIL MAESTRO DRILL (MISCELLANEOUS) ×1 IMPLANT
CLIP VESOCCLUDE MED 6/CT (CLIP) IMPLANT
COVER BACK TABLE 60X90IN (DRAPES) ×6 IMPLANT
DERMABOND ADVANCED (GAUZE/BANDAGES/DRESSINGS) ×2
DERMABOND ADVANCED .7 DNX12 (GAUZE/BANDAGES/DRESSINGS) IMPLANT
DIFFUSER DRILL AIR PNEUMATIC (MISCELLANEOUS) ×3 IMPLANT
DRAIN CHANNEL 10M FLAT 3/4 FLT (DRAIN) IMPLANT
DRAPE MICROSCOPE LEICA (MISCELLANEOUS) IMPLANT
DRAPE NEUROLOGICAL W/INCISE (DRAPES) ×3 IMPLANT
DRAPE SURG 17X23 STRL (DRAPES) IMPLANT
DRAPE WARM FLUID 44X44 (DRAPE) ×3 IMPLANT
DRILL WIRE PASS 1.3MM (BIT)
DRSG OPSITE POSTOP 4X6 (GAUZE/BANDAGES/DRESSINGS) ×4 IMPLANT
DURAMATRIX ONLAY 2X2 (Neuro Prosthesis/Implant) ×2 IMPLANT
ELECT CAUTERY BLADE 6.4 (BLADE) ×3 IMPLANT
ELECT REM PT RETURN 9FT ADLT (ELECTROSURGICAL) ×3
ELECTRODE REM PT RTRN 9FT ADLT (ELECTROSURGICAL) ×1 IMPLANT
EVACUATOR SILICONE 100CC (DRAIN) IMPLANT
GAUZE SPONGE 4X4 12PLY STRL (GAUZE/BANDAGES/DRESSINGS) ×3 IMPLANT
GAUZE SPONGE 4X4 16PLY XRAY LF (GAUZE/BANDAGES/DRESSINGS) IMPLANT
GLOVE BIOGEL PI IND STRL 8 (GLOVE) IMPLANT
GLOVE BIOGEL PI INDICATOR 8 (GLOVE) ×2
GLOVE ECLIPSE 7.5 STRL STRAW (GLOVE) ×2 IMPLANT
GLOVE ECLIPSE 9.0 STRL (GLOVE) ×3 IMPLANT
GLOVE EXAM NITRILE LRG STRL (GLOVE) IMPLANT
GLOVE EXAM NITRILE XL STR (GLOVE) IMPLANT
GLOVE EXAM NITRILE XS STR PU (GLOVE) IMPLANT
GOWN STRL REUS W/ TWL LRG LVL3 (GOWN DISPOSABLE) IMPLANT
GOWN STRL REUS W/ TWL XL LVL3 (GOWN DISPOSABLE) IMPLANT
GOWN STRL REUS W/TWL 2XL LVL3 (GOWN DISPOSABLE) ×2 IMPLANT
GOWN STRL REUS W/TWL LRG LVL3 (GOWN DISPOSABLE)
GOWN STRL REUS W/TWL XL LVL3 (GOWN DISPOSABLE) ×3
HEMOSTAT SURGICEL 2X14 (HEMOSTASIS) ×2 IMPLANT
KIT BASIN OR (CUSTOM PROCEDURE TRAY) ×3 IMPLANT
KIT TURNOVER KIT B (KITS) ×3 IMPLANT
NDL HYPO 25X1 1.5 SAFETY (NEEDLE) ×1 IMPLANT
NEEDLE HYPO 25X1 1.5 SAFETY (NEEDLE) ×3 IMPLANT
NS IRRIG 1000ML POUR BTL (IV SOLUTION) ×3 IMPLANT
OIL CARTRIDGE MAESTRO DRILL (MISCELLANEOUS) ×3
PACK CRANIOTOMY CUSTOM (CUSTOM PROCEDURE TRAY) ×3 IMPLANT
PAD ARMBOARD 7.5X6 YLW CONV (MISCELLANEOUS) ×3 IMPLANT
PATTIES SURGICAL .25X.25 (GAUZE/BANDAGES/DRESSINGS) IMPLANT
PATTIES SURGICAL .5 X.5 (GAUZE/BANDAGES/DRESSINGS) IMPLANT
PATTIES SURGICAL .5 X3 (DISPOSABLE) IMPLANT
PATTIES SURGICAL 1X1 (DISPOSABLE) IMPLANT
PIN MAYFIELD SKULL DISP (PIN) ×2 IMPLANT
SPECIMEN JAR SMALL (MISCELLANEOUS) IMPLANT
SPONGE LAP 18X18 X RAY DECT (DISPOSABLE) IMPLANT
SPONGE NEURO XRAY DETECT 1X3 (DISPOSABLE) IMPLANT
SPONGE SURGIFOAM ABS GEL 100 (HEMOSTASIS) ×3 IMPLANT
STAPLER VISISTAT 35W (STAPLE) ×3 IMPLANT
SUT NURALON 4 0 TR CR/8 (SUTURE) ×4 IMPLANT
SUT VIC AB 2-0 CT2 18 VCP726D (SUTURE) ×4 IMPLANT
SYR CONTROL 10ML LL (SYRINGE) ×3 IMPLANT
TOWEL GREEN STERILE (TOWEL DISPOSABLE) ×3 IMPLANT
TOWEL GREEN STERILE FF (TOWEL DISPOSABLE) ×3 IMPLANT
TRAY FOLEY MTR SLVR 16FR STAT (SET/KITS/TRAYS/PACK) IMPLANT
UNDERPAD 30X30 (UNDERPADS AND DIAPERS) IMPLANT
WATER STERILE IRR 1000ML POUR (IV SOLUTION) ×3 IMPLANT

## 2018-04-30 NOTE — ED Notes (Signed)
Patient transported to CT 

## 2018-04-30 NOTE — Consult Note (Addendum)
PULMONARY / CRITICAL CARE MEDICINE   Name: Tiffany Thornton MRN: [id number] DOB: 03-14-1933    ADMISSION DATE:  04/30/2018 CONSULTATION DATE:  6/3  REFERRING MD:  Pool (nsgy)   CHIEF COMPLAINT:  ICH   HISTORY OF PRESENT ILLNESS:   84yo female with minimal PMH presented 6/3 with AMS, headache, n/v.  No trauma. CT head revealed acute R cerebellar hypertensive hemorrhage with significant brainstem compression.  She was taken for emergent suboccipital craniectomy and evacuation of hemorrhage.  She remained intubated post op and PCCM consulted for vent management.   PAST MEDICAL HISTORY :  Hypothyroid   PAST SURGICAL HISTORY: She  has no past surgical history on file.  No Known Allergies  No current facility-administered medications on file prior to encounter.    No current outpatient medications on file prior to encounter.    FAMILY HISTORY:  Her has no family status information on file.    SOCIAL HISTORY: Never smoker, no ETOH   REVIEW OF SYSTEMS:   Unable.  As per HPI above obtained from records, RN.   SUBJECTIVE:    VITAL SIGNS: BP (!) 161/75   Pulse 91   Temp (!) 96.2 F (35.7 C) (Rectal)   Resp 16   Ht 5' 8.11" (1.73 m)   Wt 61.2 kg (135 lb)   SpO2 99%   BMI 20.46 kg/m   HEMODYNAMICS:    VENTILATOR SETTINGS: Vent Mode: PRVC FiO2 (%):  [100 %] 100 % Set Rate:  [16 bmp] 16 bmp Vt Set:  [510 mL] 510 mL PEEP:  [5 cmH20] 5 cmH20 Plateau Pressure:  [12 cmH20] 12 cmH20  INTAKE / OUTPUT: No intake/output data recorded.  PHYSICAL EXAMINATION: General:  Thin, elderly female, NAD on vent  Neuro:  Sedated post op, RASS -1, opens eyes to voice, follows some commands, PERRL HEENT:  Mm moist, ETT  Cardiovascular:  s1s2 rrr Lungs:  resps even non labored on vent, clear  Abdomen:  Soft, +bs  Musculoskeletal:  Warm and dry, no edema   LABS:  BMET Recent Labs  Lab 04/30/18 0718 04/30/18 0734  NA 137 137  K 3.8 3.7  CL 102 101  CO2 24  --   BUN 16 20   CREATININE 1.01* 0.80  GLUCOSE 181* 178*    Electrolytes Recent Labs  Lab 04/30/18 0718  CALCIUM 9.5    CBC Recent Labs  Lab 04/30/18 0718 04/30/18 0734 04/30/18 1138  WBC 12.5*  --  11.0*  HGB 14.2 15.3* 12.8  HCT 42.8 45.0 38.6  PLT 326  --  300    Coag's Recent Labs  Lab 04/30/18 0718  INR 0.86    Sepsis Markers Recent Labs  Lab 04/30/18 0735  LATICACIDVEN 2.01*    ABG No results for input(s): PHART, PCO2ART, PO2ART in the last 168 hours.  Liver Enzymes Recent Labs  Lab 04/30/18 0718  AST 28  ALT 18  ALKPHOS 62  BILITOT 0.4  ALBUMIN 4.2    Cardiac Enzymes No results for input(s): TROPONINI, PROBNP in the last 168 hours.  Glucose Recent Labs  Lab 04/30/18 0723  GLUCAP 174*    Imaging Ct Head Wo Contrast  Result Date: 04/30/2018 CLINICAL DATA:  84 year old female with altered mental status. Initial encounter. EXAM: CT HEAD WITHOUT CONTRAST TECHNIQUE: Contiguous axial images were obtained from the base of the skull through the vertex without intravenous contrast. COMPARISON:  None. FINDINGS: Brain: 4 x 3.9 x 2.6 cm right cerebellar hemorrhage with surrounding vasogenic edema  and local mass effect. Compression of the fourth ventricle. Patient at risk for development of hydrocephalus. Mild superior displacement of the vermis. Minimal inferior displacement right cerebellar tonsil. No other intracranial mass lesion seen on present unenhanced head CT. Remote left thalamic infarct. Moderate chronic microvascular changes. Mild atrophy. Vascular: Vascular calcifications. Skull: No skull fracture. Sinuses/Orbits: No acute orbital abnormality. Visualized paranasal sinuses are clear. Other: Mastoid air cells and middle ear cavities are clear. IMPRESSION: 4 x 3.9 x 2.6 cm right cerebellar hemorrhage with surrounding vasogenic edema and local mass effect. Compression of the fourth ventricle. Patient at risk for development of hydrocephalus. Mild superior  displacement of the vermis. Minimal inferior displacement right cerebellar tonsil. Cerebellar hemorrhage may be related to hypertensive bleed. Underlying lesion not excluded. Remote left thalamic infarct. Moderate chronic microvascular changes. These results were called by telephone at the time of interpretation on 04/30/2018 at 7:30 am to Dr. Ilda Bassetardona who verbally acknowledged these results. Electronically Signed   By: Lacy DuverneySteven  Olson M.D.   On: 04/30/2018 07:42   Dg Chest Portable 1 View  Result Date: 04/30/2018 CLINICAL DATA:  Hypoxia EXAM: PORTABLE CHEST 1 VIEW COMPARISON:  None. FINDINGS: Endotracheal tube tip is 1.2 cm above the carina. Nasogastric tube tip and side port are below the diaphragm. No pneumothorax. No edema or consolidation. Heart size and pulmonary vascularity are normal. No adenopathy. No bone lesions. IMPRESSION: Tube positions as described without pneumothorax. Note that the endotracheal tube is near the carina. It may be prudent to consider withdrawing endotracheal tube 2-3 cm. No edema or consolidation. Heart size normal. There is aortic atherosclerosis. Aortic Atherosclerosis (ICD10-I70.0). Electronically Signed   By: Bretta BangWilliam  Woodruff III M.D.   On: 04/30/2018 08:17     STUDIES:  CT head 6/3>>> 4 x 3.9 x 2.6 cm right cerebellar hemorrhage with surrounding vasogenic edema and local mass effect. Compression of the fourth ventricle. Patient at risk for development of hydrocephalus. Mild superior displacement of the vermis. Minimal inferior displacement right cerebellar tonsil.  Cerebellar hemorrhage may be related to hypertensive bleed. Underlying lesion not excluded.  Remote left thalamic infarct. Moderate chronic microvascular changes.  CULTURES:   ANTIBIOTICS:   SIGNIFICANT EVENTS: 6/3>> suboccipital craniectomy for acute ICH  LINES/TUBES: ETT 6/3>>>  DISCUSSION: 84yo female with acute ICH with mass effect s/p craniectomy 6/3. VDRF post op.   ASSESSMENT /  PLAN:  NEUROLOGIC Large cerebellar ICH - ?? R/t HTN. S/p craniectomy 6/3 P:   RASS goal: -1 Add Propofol if sedation is needed for vent synchrony  PRN fentanyl  Post op care per nsgy  Neuro following  F/u CT  BP control as below  PULMONARY VDRF in setting AMS r/t large ICH  P:   Vent support - 8cc/kg  F/u CXR  F/u ABG Daily SBT  May be able to extubate in am if mental status continues to improve   CARDIOVASCULAR HTN urgency- unclear if cause of or response to large ICH  P:  PRN labetalol  cardene gtt - goal SBP <160  RENAL No active issue  P:   F/u chem in am   GASTROINTESTINAL No active issue  P:   NPO  TF in am if no extubation  PPI   HEMATOLOGIC ICH  P:  Hold home ASA  SCD's   INFECTIOUS No active issue  P:   Peri-op ancef  F/u wbc, fever curve   ENDOCRINE Hx hypothyroid    P:   F/u TSH  Continue home synthroid - awaiting  pharmacy med rec    FAMILY  - Updates: no family at bedside 6/3. Updated by nsgy post op.   - Inter-disciplinary family meet or Palliative Care meeting due by:  Day 7     Dirk Dress, NP 04/30/2018  11:52 AM Pager: [phone] or [phone]  Attending Note:  84 year old female with PMH above who presents with AMS, headache and N/V.  CT revealed an acute right sided cerebellar hypertensive bleed.  Patient was taken emergently to the OR and the bleed was evacuated.  PCCM was consulted for vent management post op.  On exam, patient is moving all ext spontaneously and breathing over the vent but clearly groggy.  I reviewed head CT myself, bleed noted.  Discussed with PCCM-NP.  Will adjust vent setting.  ABG ordered and vent adjusted accordingly.  BP management per neurology.  Maintain NPO as anticipate will be able to extubate in AM unless something unexpected happens.  Hydrate.  Address electrolytes.  PCCM will continue to follow.  The patient is critically ill with multiple organ systems failure and requires high  complexity decision making for assessment and support, frequent evaluation and titration of therapies, application of advanced monitoring technologies and extensive interpretation of multiple databases.   Critical Care Time devoted to patient care services described in this note is  37  Minutes. This time reflects time of care of this signee Dr Koren Bound. This critical care time does not reflect procedure time, or teaching time or supervisory time of PA/NP/Med student/Med Resident etc but could involve care discussion time.  Alyson Reedy, M.D. [facility] Pulmonary/Critical Care Medicine. Pager: [phone]. After hours pager: [phone].

## 2018-04-30 NOTE — ED Notes (Signed)
Pt brought to trauma c to intubate d/t decreased mentation and ct showing bleed.

## 2018-04-30 NOTE — ED Notes (Signed)
Dr. Otelia LimesLindzen paged to PA Tiburcio PeaHarris per her request

## 2018-04-30 NOTE — ED Provider Notes (Signed)
[place] EMERGENCY DEPARTMENT Provider Note   CSN: [id number] Arrival date & time: 04/30/18  0703     History   Chief Complaint Chief Complaint  Patient presents with  . Altered Mental Status  . Hypertension   Level 5 caveat due to acute altered mental status HPI Tiffany Thornton is a 84 y.o. female brought in by EMS for Altered mental status.  EMS was called by the patient who states that she did not feel well.  When they arrived she appeared to be stuporous and confused.  Upon arrival here at the emergency department she began vomiting.  She was arousable to voice and touch.  Patient upon my initial evaluation was oriented to time and place however lethargic and stuporous.  She was a very poor historian.  Notably hypertensive with without other obvious neurologic deficit except slightly disconjugate gaze with the left eye.  Patient seen and shared visit with Dr. Vanita Panda immediately upon arrival.  Her appearance was concerning and we asked her to have an emergent CT head.. Her children are alive shortly thereafter and her son states that she had called another relative and said that she had a "terrible headache."   HPI  No past medical history on file.  Patient Active Problem List   Diagnosis Date Noted  . S/P craniotomy 04/30/2018  . Cerebellar hemorrhage, acute ([place]) 04/30/2018    The histories are not reviewed yet. Please review them in the "History" navigator section and refresh this [place].   OB History   None      Home Medications    Prior to Admission medications   Medication Sig Start Date End Date Taking? Authorizing Provider  levothyroxine (SYNTHROID, LEVOTHROID) 100 MCG tablet Take 100 mcg by mouth daily before breakfast. 04/21/18  Yes [provider]    Family History No family history on file.  Social History Social History   Tobacco Use  . Smoking status: Not on file  Substance Use Topics  . Alcohol use: Not on file  .  Drug use: Not on file     Allergies   Patient has no known allergies.   Review of Systems Review of Systems   Physical Exam Updated Vital Signs BP (!) 146/64   Pulse 91   Temp (!) 97.2 F (36.2 C) (Oral)   Resp 19   Ht 5' 8.11" (1.73 m)   Wt 61.2 kg (135 lb)   SpO2 99%   BMI 20.46 kg/m   Physical Exam   ED Treatments / Results  Labs (all labs ordered are listed, but only abnormal results are displayed) Labs Reviewed  CBC WITH DIFFERENTIAL/PLATELET - Abnormal; Notable for the following components:      Result Value   WBC 12.5 (*)    Neutro Abs 9.7 (*)    All other components within normal limits  COMPREHENSIVE METABOLIC PANEL - Abnormal; Notable for the following components:   Glucose, Bld 181 (*)    Creatinine, Ser 1.01 (*)    GFR calc non Af Amer 50 (*)    GFR calc Af Amer 58 (*)    All other components within normal limits  URINALYSIS, ROUTINE W REFLEX MICROSCOPIC - Abnormal; Notable for the following components:   Color, Urine STRAW (*)    Protein, ur 100 (*)    Bacteria, UA RARE (*)    All other components within normal limits  BASIC METABOLIC PANEL - Abnormal; Notable for the following components:   CO2 20 (*)  Glucose, Bld 200 (*)    Calcium 8.1 (*)    All other components within normal limits  CBC - Abnormal; Notable for the following components:   WBC 11.0 (*)    All other components within normal limits  CBG MONITORING, ED - Abnormal; Notable for the following components:   Glucose-Capillary 174 (*)    All other components within normal limits  I-STAT CHEM 8, ED - Abnormal; Notable for the following components:   Glucose, Bld 178 (*)    Hemoglobin 15.3 (*)    All other components within normal limits  I-STAT CG4 LACTIC ACID, ED - Abnormal; Notable for the following components:   Lactic Acid, Venous 2.01 (*)    All other components within normal limits  POCT I-STAT 7, (LYTES, BLD GAS, ICA,H+H) - Abnormal; Notable for the following  components:   pH, Arterial 7.337 (*)    pO2, Arterial 269.0 (*)    Acid-base deficit 4.0 (*)    Calcium, Ion 1.11 (*)    HCT 34.0 (*)    Hemoglobin 11.6 (*)    All other components within normal limits  POCT I-STAT 3, ART BLOOD GAS (G3+) - Abnormal; Notable for the following components:   pH, Arterial 7.343 (*)    pO2, Arterial 208.0 (*)    Acid-base deficit 4.0 (*)    All other components within normal limits  MRSA PCR SCREENING  AMMONIA  PROTIME-INR  LIPASE, BLOOD  TRIGLYCERIDES  TSH  BASIC METABOLIC PANEL  CBC  I-STAT TROPONIN, ED    EKG None  Radiology Ct Head Wo Contrast  Result Date: 04/30/2018 CLINICAL DATA:  84 year old female with altered mental status. Initial encounter. EXAM: CT HEAD WITHOUT CONTRAST TECHNIQUE: Contiguous axial images were obtained from the base of the skull through the vertex without intravenous contrast. COMPARISON:  None. FINDINGS: Brain: 4 x 3.9 x 2.6 cm right cerebellar hemorrhage with surrounding vasogenic edema and local mass effect. Compression of the fourth ventricle. Patient at risk for development of hydrocephalus. Mild superior displacement of the vermis. Minimal inferior displacement right cerebellar tonsil. No other intracranial mass lesion seen on present unenhanced head CT. Remote left thalamic infarct. Moderate chronic microvascular changes. Mild atrophy. Vascular: Vascular calcifications. Skull: No skull fracture. Sinuses/Orbits: No acute orbital abnormality. Visualized paranasal sinuses are clear. Other: Mastoid air cells and middle ear cavities are clear. IMPRESSION: 4 x 3.9 x 2.6 cm right cerebellar hemorrhage with surrounding vasogenic edema and local mass effect. Compression of the fourth ventricle. Patient at risk for development of hydrocephalus. Mild superior displacement of the vermis. Minimal inferior displacement right cerebellar tonsil. Cerebellar hemorrhage may be related to hypertensive bleed. Underlying lesion not excluded.  Remote left thalamic infarct. Moderate chronic microvascular changes. These results were called by telephone at the time of interpretation on 04/30/2018 at 7:30 am to Dr. Jeris Penta who verbally acknowledged these results. Electronically Signed   By: Genia Del M.D.   On: 04/30/2018 07:42   Dg Chest Portable 1 View  Result Date: 04/30/2018 CLINICAL DATA:  Hypoxia EXAM: PORTABLE CHEST 1 VIEW COMPARISON:  None. FINDINGS: Endotracheal tube tip is 1.2 cm above the carina. Nasogastric tube tip and side port are below the diaphragm. No pneumothorax. No edema or consolidation. Heart size and pulmonary vascularity are normal. No adenopathy. No bone lesions. IMPRESSION: Tube positions as described without pneumothorax. Note that the endotracheal tube is near the carina. It may be prudent to consider withdrawing endotracheal tube 2-3 cm. No edema or consolidation. Heart size  normal. There is aortic atherosclerosis. Aortic Atherosclerosis (ICD10-I70.0). Electronically Signed   By: Lowella Grip III M.D.   On: 04/30/2018 08:17    Procedures Procedures (including critical care time)  Medications Ordered in ED Medications  midazolam (VERSED) injection 1 mg ( Intravenous MAR Unhold 04/30/18 1045)  midazolam (VERSED) injection 1 mg ( Intravenous MAR Unhold 04/30/18 1045)  fentaNYL (SUBLIMAZE) 100 MCG/2ML injection (has no administration in time range)  midazolam (VERSED) 2 MG/2ML injection (has no administration in time range)  ondansetron (ZOFRAN) tablet 4 mg (has no administration in time range)    Or  ondansetron (ZOFRAN) injection 4 mg (has no administration in time range)  promethazine (PHENERGAN) tablet 12.5-25 mg (has no administration in time range)  labetalol (NORMODYNE,TRANDATE) injection 10-40 mg (has no administration in time range)  famotidine (PEPCID) IVPB 20 mg premix (0 mg Intravenous Stopped 04/30/18 1203)  0.9 % NaCl with KCl 20 mEq/ L  infusion ( Intravenous Rate/Dose Verify 04/30/18 1600)    ceFAZolin (ANCEF) IVPB 1 g/50 mL premix (has no administration in time range)  acetaminophen (TYLENOL) tablet 650 mg (has no administration in time range)    Or  acetaminophen (TYLENOL) suppository 650 mg (has no administration in time range)  naloxone (NARCAN) injection 0.08 mg (has no administration in time range)  propofol (DIPRIVAN) 1000 MG/100ML infusion (50 mcg/kg/min  61.2 kg Intravenous Rate/Dose Verify 04/30/18 1600)  fentaNYL (SUBLIMAZE) injection 50 mcg (50 mcg Intravenous Given 04/30/18 1203)  nicardipine (CARDENE) 77m in 0.86% saline 2037mIV infusion (0.1 mg/ml) (5 mg/hr Intravenous Rate/Dose Verify 04/30/18 1600)  chlorhexidine gluconate (MEDLINE KIT) (PERIDEX) 0.12 % solution 15 mL (15 mLs Mouth Rinse Given 04/30/18 1320)  MEDLINE mouth rinse (15 mLs Mouth Rinse Given 04/30/18 1515)  etomidate (AMIDATE) injection (20 mg Intravenous Given 04/30/18 0748)  succinylcholine (ANECTINE) injection (100 mg Intravenous Given 04/30/18 0750)  nicardipine (CARDENE) 2023mn 0.86% saline 200m56m infusion (0.1 mg/ml) (9 mg/hr Intravenous Rate/Dose Verify 04/30/18 1102)  fentaNYL (SUBLIMAZE) injection (50 mcg Intravenous Given 04/30/18 0829)  midazolam (VERSED) injection (1 mg Intravenous Given 04/30/18 0829)  chlorhexidine (PERIDEX) 0.12 % solution (15 mLs  Given 04/30/18 1341)     Initial Impression / Assessment and Plan / ED Course  I have reviewed the triage vital signs and the nursing notes.  Pertinent labs & imaging results that were available during my care of the patient were reviewed by me and considered in my medical decision making (see chart for details).  Clinical Course as of May 01 1703  Mon Apr 30, 2018  0720 I spoke with patient's sons who say that she called a relative and was c/o severe headache. I decided to attend the patient on the way to CT.   [AH]  0730 I Identified large intracerebral hemorrhage on initial CT.  I immediately informed Dr. LockDarleene Cleaverient's mental status was  declining.   [AH]  0740 Patient moved to trauma C for intubation. Her HR has decreased and she has become more confused.  I informed patient that she would need intubation and she asked that I confer with her children as she has been previously DNR.  Children wish for her to be intubated as she has a good quality of life..    [AH]  [id number] seen by Dr. LindCheral Marker reviewed the patient's imaging and recommends emergent neurosurgical consult as she has mass-effect and he feels she will need emergent occipital craniotomy.   [AH]  0805 Patient will be started on  Cardene drip for her hypertension   [AH]  1704 8:20 patient seen by Dr. Trenton Gammon here in the emergency department and he has prepared the patient for emergent craniectomy.  I have discussed the case with the patient's family.  She continues to remain stable and it is their wish that we proceed with surgery.  They will sign for consent.   [AH]    Clinical Course User Index [AH] Margarita Mail, PA-C    Patient seen and identified upfront as critically ill.  I attended the patient and was at bedside for nearly her entire ED course given the emergent and impending decline with massive intracranial hemorrhage due to hypertension.  Patient was intubated at the request of her family and sent to the OR for emergent craniectomy after being given blood pressure lowering medications.  She was seen and shared visit with Dr. Vanita Panda who performed the intubation.  I consulted with both neurology and neurosurgery.  She is stabilized and ready for transport to the OR.  Final Clinical Impressions(s) / ED Diagnoses   Final diagnoses:  Left-sided nontraumatic intracerebral hemorrhage of cerebellum [facility])    ED Discharge Orders    None       Margarita Mail, PA-C 04/30/18 1707    Carmin Muskrat, MD 05/04/18 2359

## 2018-04-30 NOTE — Op Note (Signed)
Date of procedure: 04/30/2018  Date of dictation: Same  Service: Neurosurgery  Preoperative diagnosis: Right lobar cerebellar hypertensive hemorrhage with brainstem compression  Postoperative diagnosis: Same  Procedure Name: Emergent suboccipital craniectomy with evacuation of right cerebellar hematoma, microdissection  Surgeon:Ilisa Hayworth A.Tarez Bowns, M.D.  Asst. Surgeon: None  Anesthesia: General  Indication: 84 year old female otherwise in good health presents with rapid onset headache, confusion and nausea vomiting.  Work-up demonstrates evidence of an acute right-sided cerebellar hemorrhage consistent with a hypertensive hemorrhage causing fourth ventricle and brainstem compression.  Patient presents now for emergent evacuation of hematoma.  Operative note: After induction of anesthesia, patient position prone onto bolsters with her head fixed in the [place] pin headrest.  Patient's occipital region and upper cervical region prepped and draped sterilely.  Incision made from occiput down to C2.  Dissection performed bilaterally.  Suboccipital craniectomy was then performed using high-speed drill and Kerrison Aundria Rudogers and [employer] Rogers to remove the suboccipital bone and the posterior wall of the foramen magnum.  Dura was then opened overlying the right cerebellar hemisphere in a cruciate fashion.  Cerebellum appeared viable.  A cortical incision was made and carried down deeply into the hemorrhage cavity.  The hemorrhage was identified and evacuated with gentle suction.  There was no evidence of any brisk significant bleeding.  Microscope brought in field used for microdissection of the deep wound space.  Friable tissue and small points of bleeding were coagulated using the bipolar cautery.  Surgifoam was placed in the deep cavity and allowed to sit.  Hemostasis was good.  The Surgifoam was evacuated.  The hemorrhage cavity was lined with Surgicel.  The dura was left open.  Gelfoam was placed over the  craniectomy defect.  Dural matrix was also placed over the cranial defect.  Wound was irrigated one final time.  Wounds and closed in layers of Vicryl sutures.  Skin was reapproximated with a running 3-0 nylon.  There is no evidence of any complications.  Patient tolerated the procedure well and she returns to the ICU postop.

## 2018-04-30 NOTE — ED Notes (Signed)
Patient follows command-slow to respond

## 2018-04-30 NOTE — Consult Note (Addendum)
NEURO HOSPITALIST CONSULT NOTE   Requesting physician: Dr. Jeraldine Loots  Reason for Consult: Acute right cerebellar hemorrhage  History obtained from:  Patient and Chart    HPI:                                                                                                                                          Tiffany Thornton is an 84 y.o. female presenting to the ED for AMS with N/V and vertigo. LKN was 2100. She was hypertensive with SBP > 200 on arrival. STAT CT head showed a large acute right cerebellar hemorrhage with mass effect. She has had waxing and waning mental status since arrival. She takes ASA intermittently at home. She is not on a blood thinner.   ICH Score: 3  No past medical history on file.  No family history on file.        Social History:  has no tobacco, alcohol, and drug history on file.  Allergies not on file  MEDICATIONS:                                                                                                                     Includes ASA Not taking a blood thinner Full list of home meds not in Epic  ROS:                                                                                                                                       Positive for vertigo. Full ROS deferred due to acuity of presentation.   Blood pressure (!) 212/72, pulse (!) 49, resp. rate (!) 23, height 5' 8.11" (1.73 m), weight 61.2 kg (135 lb), SpO2 99 %.  General Examination:                                                                                                      Physical Exam  HEENT-  Normocephalic, atraumatic.    Lungs-Respirations unlabored Extremities- No edema  Neurological Examination Mental Status: Drowsy to somnolent. Fully oriented. Slow dysarthric speech with intact comprehension and fluency.  Cranial Nerves: II: Able to track and count fingers. PERRL.   III,IV, VI: No ptosis. EOMI without nystagmus. Rightward gaze provokes  worsened vertigo.  V,VII: Smile symmetric, facial temp sensation intact bilaterally. No extinction.  VIII: Hearing intact to voice.  IX,X: No hypophonia XI: No gross asymmetry XII: Tongue deviates slightly to left when protruded Motor: Bilateral upper extremities 5/5 Bilateral lower extremities 5/5 Sensory: FT and temp sensation intact bilaterally.  Deep Tendon Reflexes: 2+ and symmetric throughout Plantars: Toes equivocal bilaterally Cerebellar: Prominent ataxia with right FNF and right H-S Gait: Deferred due to acuity of presentation   Lab Results: Basic Metabolic Panel: Recent Labs  Lab 04/30/18 0734  NA 137  K 3.7  CL 101  GLUCOSE 178*  BUN 20  CREATININE 0.80    CBC: Recent Labs  Lab 04/30/18 0718 04/30/18 0734  WBC 12.5*  --   NEUTROABS 9.7*  --   HGB 14.2 15.3*  HCT 42.8 45.0  MCV 94.7  --   PLT 326  --     Cardiac Enzymes: No results for input(s): CKTOTAL, CKMB, CKMBINDEX, TROPONINI in the last 168 hours.  Lipid Panel: No results for input(s): CHOL, TRIG, HDL, CHOLHDL, VLDL, LDLCALC in the last 168 hours.  Imaging: Ct Head Wo Contrast  Result Date: 04/30/2018 CLINICAL DATA:  84 year old female with altered mental status. Initial encounter. EXAM: CT HEAD WITHOUT CONTRAST TECHNIQUE: Contiguous axial images were obtained from the base of the skull through the vertex without intravenous contrast. COMPARISON:  None. FINDINGS: Brain: 4 x 3.9 x 2.6 cm right cerebellar hemorrhage with surrounding vasogenic edema and local mass effect. Compression of the fourth ventricle. Patient at risk for development of hydrocephalus. Mild superior displacement of the vermis. Minimal inferior displacement right cerebellar tonsil. No other intracranial mass lesion seen on present unenhanced head CT. Remote left thalamic infarct. Moderate chronic microvascular changes. Mild atrophy. Vascular: Vascular calcifications. Skull: No skull fracture. Sinuses/Orbits: No acute orbital  abnormality. Visualized paranasal sinuses are clear. Other: Mastoid air cells and middle ear cavities are clear. IMPRESSION: 4 x 3.9 x 2.6 cm right cerebellar hemorrhage with surrounding vasogenic edema and local mass effect. Compression of the fourth ventricle. Patient at risk for development of hydrocephalus. Mild superior displacement of the vermis. Minimal inferior displacement right cerebellar tonsil. Cerebellar hemorrhage may be related to hypertensive bleed. Underlying lesion not excluded. Remote left thalamic infarct. Moderate chronic microvascular changes. These results were called by telephone at the time of interpretation on 04/30/2018 at 7:30 am to Dr. Ilda Bassetardona who verbally acknowledged these results. Electronically Signed   By: Lacy DuverneySteven  Olson M.D.   On: 04/30/2018 07:42    Assessment: 84 year old female with large right  cerebellar ICH 1. CT shows mass effect from ICH. High risk of morbidity and mortality. Discussed with the patient and her family.  2. HTN. May have precipitated the ICH or be a physiological response to it.   Recommendations: 1. STAT Neurosurgery consult has been called in by ED team.  2. Family have provided preliminary consent for surgery. The patient stated that she would like to defer to family for decision making regarding surgery.  3. BP management.  4. Frequent neuro checks 5. BP management with nicardipine drip. SBP goal of < 140.  6. No antiplatelet medications or anticoagulants. DVT prophylaxis with SCDs 7. Post-op management per Neurosurgery team  45 minutes were spent in the emergent neurological evaluation and management of this critically ill acute ICH patient  Electronically signed: Dr. Caryl Pina 04/30/2018, 8:00 AM

## 2018-04-30 NOTE — Transfer of Care (Signed)
Immediate Anesthesia Transfer of Care Note  Patient: Tiffany Thornton  Procedure(s) Performed: CRANIOTOMY HEMATOMA EVACUATION SUBDURAL (N/A )  Patient Location: NICU  Anesthesia Type:General  Level of Consciousness: sedated and Patient remains intubated per anesthesia plan  Airway & Oxygen Therapy: Patient remains intubated per anesthesia plan and Patient placed on Ventilator (see vital sign flow sheet for setting)  Post-op Assessment: Report given to RN and Post -op Vital signs reviewed and stable  Post vital signs: Reviewed and stable  Last Vitals:  Vitals Value Taken Time  BP 161/75 04/30/2018 11:00 AM  Temp    Pulse 91 04/30/2018 11:05 AM  Resp 16 04/30/2018 11:05 AM  SpO2 100 % 04/30/2018 11:05 AM  Vitals shown include unvalidated device data.  Last Pain:  Vitals:   04/30/18 0711  TempSrc: Rectal         Complications: No apparent anesthesia complications

## 2018-04-30 NOTE — Sedation Documentation (Signed)
Neurologist at bedside. 

## 2018-04-30 NOTE — Consult Note (Signed)
Reason for Consult: Cerebellar hematoma Referring Physician: Neurology  Tiffany Thornton is an 84 y.o. female.  HPI: 84 year old female otherwise in good health presents with decreased  level of consciousness with associated headache, nausea and vomiting.  Symptoms began suddenly today.  No prior history of illness or poor feeling.  Patient without history of trauma.  Patient with no other ongoing major medical problems aside from hypothyroidism.  No past medical history on file.    No family history on file.  Social History:  has no tobacco, alcohol, and drug history on file.  Allergies: Allergies not on file  Medications: I have reviewed the patient's current medications.  Results for orders placed or performed during the hospital encounter of 04/30/18 (from the past 48 hour(s))  CBC with Differential     Status: Abnormal   Collection Time: 04/30/18  7:18 AM  Result Value Ref Range   WBC 12.5 (H) 4.0 - 10.5 K/uL   RBC 4.52 3.87 - 5.11 MIL/uL   Hemoglobin 14.2 12.0 - 15.0 g/dL   HCT 40.9 81.1 - 91.4 %   MCV 94.7 78.0 - 100.0 fL   MCH 31.4 26.0 - 34.0 pg   MCHC 33.2 30.0 - 36.0 g/dL   RDW 78.2 95.6 - 21.3 %   Platelets 326 150 - 400 K/uL   Neutrophils Relative % 78 %   Neutro Abs 9.7 (H) 1.7 - 7.7 K/uL   Lymphocytes Relative 12 %   Lymphs Abs 1.5 0.7 - 4.0 K/uL   Monocytes Relative 8 %   Monocytes Absolute 1.0 0.1 - 1.0 K/uL   Eosinophils Relative 1 %   Eosinophils Absolute 0.1 0.0 - 0.7 K/uL   Basophils Relative 1 %   Basophils Absolute 0.1 0.0 - 0.1 K/uL   Immature Granulocytes 0 %   Abs Immature Granulocytes 0.1 0.0 - 0.1 K/uL    Comment: Performed at [facility], [address] [place], [place] [address]  Urinalysis, Routine w reflex microscopic     Status: Abnormal   Collection Time: 04/30/18  7:18 AM  Result Value Ref Range   Color, Urine STRAW (A) YELLOW   APPearance CLEAR CLEAR   Specific Gravity, Urine 1.006 1.005 - 1.030   pH 7.0 5.0 - 8.0   Glucose,  UA NEGATIVE NEGATIVE mg/dL   Hgb urine dipstick NEGATIVE NEGATIVE   Bilirubin Urine NEGATIVE NEGATIVE   Ketones, ur NEGATIVE NEGATIVE mg/dL   Protein, ur 846 (A) NEGATIVE mg/dL   Nitrite NEGATIVE NEGATIVE   Leukocytes, UA NEGATIVE NEGATIVE   RBC / HPF 0-5 0 - 5 RBC/hpf   WBC, UA 0-5 0 - 5 WBC/hpf   Bacteria, UA RARE (A) NONE SEEN   Mucus PRESENT     Comment: Performed at [facility], [address], [place], [place] [address]  Protime-INR     Status: None   Collection Time: 04/30/18  7:18 AM  Result Value Ref Range   Prothrombin Time 11.6 11.4 - 15.2 seconds   INR 0.86     Comment: Performed at [facility], [address], [place], [place] [address]  CBG monitoring, ED     Status: Abnormal   Collection Time: 04/30/18  7:23 AM  Result Value Ref Range   Glucose-Capillary 174 (H) 65 - 99 mg/dL  I-stat troponin, ED     Status: None   Collection Time: 04/30/18  7:33 AM  Result Value Ref Range   Troponin i, poc 0.01 0.00 -  0.08 ng/mL   Comment 3            Comment: Due to the release kinetics of cTnI, a negative result within the first hours of the onset of symptoms does not rule out myocardial infarction with certainty. If myocardial infarction is still suspected, repeat the test at appropriate intervals.   I-stat Chem 8, ED     Status: Abnormal   Collection Time: 04/30/18  7:34 AM  Result Value Ref Range   Sodium 137 135 - 145 mmol/L   Potassium 3.7 3.5 - 5.1 mmol/L   Chloride 101 101 - 111 mmol/L   BUN 20 6 - 20 mg/dL   Creatinine, Ser 1.61 0.44 - 1.00 mg/dL   Glucose, Bld 096 (H) 65 - 99 mg/dL   Calcium, Ion 0.45 4.09 - 1.40 mmol/L   TCO2 24 22 - 32 mmol/L   Hemoglobin 15.3 (H) 12.0 - 15.0 g/dL   HCT 81.1 91.4 - 78.2 %  I-Stat CG4 Lactic Acid, ED     Status: Abnormal   Collection Time: 04/30/18  7:35 AM  Result Value Ref Range   Lactic Acid, Venous 2.01 (HH) 0.5 - 1.9 mmol/L   Comment NOTIFIED PHYSICIAN     Ct Head Wo Contrast  Result Date:  04/30/2018 CLINICAL DATA:  84 year old female with altered mental status. Initial encounter. EXAM: CT HEAD WITHOUT CONTRAST TECHNIQUE: Contiguous axial images were obtained from the base of the skull through the vertex without intravenous contrast. COMPARISON:  None. FINDINGS: Brain: 4 x 3.9 x 2.6 cm right cerebellar hemorrhage with surrounding vasogenic edema and local mass effect. Compression of the fourth ventricle. Patient at risk for development of hydrocephalus. Mild superior displacement of the vermis. Minimal inferior displacement right cerebellar tonsil. No other intracranial mass lesion seen on present unenhanced head CT. Remote left thalamic infarct. Moderate chronic microvascular changes. Mild atrophy. Vascular: Vascular calcifications. Skull: No skull fracture. Sinuses/Orbits: No acute orbital abnormality. Visualized paranasal sinuses are clear. Other: Mastoid air cells and middle ear cavities are clear. IMPRESSION: 4 x 3.9 x 2.6 cm right cerebellar hemorrhage with surrounding vasogenic edema and local mass effect. Compression of the fourth ventricle. Patient at risk for development of hydrocephalus. Mild superior displacement of the vermis. Minimal inferior displacement right cerebellar tonsil. Cerebellar hemorrhage may be related to hypertensive bleed. Underlying lesion not excluded. Remote left thalamic infarct. Moderate chronic microvascular changes. These results were called by telephone at the time of interpretation on 04/30/2018 at 7:30 am to Dr. Ilda Basset who verbally acknowledged these results. Electronically Signed   By: Lacy Duverney M.D.   On: 04/30/2018 07:42   Dg Chest Portable 1 View  Result Date: 04/30/2018 CLINICAL DATA:  Hypoxia EXAM: PORTABLE CHEST 1 VIEW COMPARISON:  None. FINDINGS: Endotracheal tube tip is 1.2 cm above the carina. Nasogastric tube tip and side port are below the diaphragm. No pneumothorax. No edema or consolidation. Heart size and pulmonary vascularity are normal. No  adenopathy. No bone lesions. IMPRESSION: Tube positions as described without pneumothorax. Note that the endotracheal tube is near the carina. It may be prudent to consider withdrawing endotracheal tube 2-3 cm. No edema or consolidation. Heart size normal. There is aortic atherosclerosis. Aortic Atherosclerosis (ICD10-I70.0). Electronically Signed   By: Bretta Bang III M.D.   On: 04/30/2018 08:17    Review of systems not obtained due to patient factors. Blood pressure (!) 157/69, pulse (!) 48, resp. rate 14, height 5' 8.11" (1.73 m), weight 61.2 kg (135 lb), SpO2  100 %. Patient is intubated on ventilator.  She is sedated.  She will open eyes to voice.  She follows commands bilaterally.  Strength appears equal.  Pupils are 2 mm and reactive bilaterally.  Corneal reflexes are present bilaterally.  Cough and gag is present.  Examination head ears eyes nose throat is unremarkable.  Chest is clear bilaterally.  Abdomen is soft.  Examination of the extremities reveals no evidence of injury deformity.  Assessment/Plan  Acute right cerebellar lobar hypertensive hemorrhage with significant brainstem compression.  I discussed situation with the patient and her family.  They wish that all interventions be performed that are likely to be beneficial.  I have discussed with them the possibility of undergoing emergent suboccipital craniectomy and evacuation of hemorrhage.  I discussed the risks involved with surgery including but not limited to the risk of anesthesia, bleeding, infection, brain injury, further stroke, further hemorrhage, continued symptoms and death.  The patient is aware of these risks as is her family.  They wish to proceed with surgery emergently.  Omarion Minnehan A Kyree Fedorko 04/30/2018, 8:20 AM

## 2018-04-30 NOTE — Anesthesia Preprocedure Evaluation (Signed)
Anesthesia Evaluation  Patient identified by MRN, date of birth, ID band Patient confused    Reviewed: Unable to perform ROS - Chart review onlyPreop documentation limited or incomplete due to emergent nature of procedure.  Airway Mallampati: Intubated       Dental  (+) Teeth Intact   Pulmonary    breath sounds clear to auscultation       Cardiovascular  Rhythm:Regular     Neuro/Psych CVA, Residual Symptoms    GI/Hepatic Neg liver ROS,   Endo/Other    Renal/GU      Musculoskeletal   Abdominal   Peds  Hematology   Anesthesia Other Findings Thyroidectomy scar, HTN, hemorrhagic stroke  Reproductive/Obstetrics                             Anesthesia Physical Anesthesia Plan  ASA: IV and emergent  Anesthesia Plan: General   Post-op Pain Management:    Induction: Inhalational  PONV Risk Score and Plan: 3 and Treatment may vary due to age or medical condition  Airway Management Planned: Oral ETT  Additional Equipment: Arterial line, CVP and Ultrasound Guidance Line Placement  Intra-op Plan:   Post-operative Plan: Post-operative intubation/ventilation  Informed Consent:   Only emergency history available and History available from chart only  Plan Discussed with: CRNA and Surgeon  Anesthesia Plan Comments:         Anesthesia Quick Evaluation

## 2018-04-30 NOTE — ED Notes (Signed)
Pt belongings given to pt daughter.

## 2018-04-30 NOTE — ED Notes (Signed)
Dr Dutch Quintpoole at bedside.

## 2018-04-30 NOTE — Anesthesia Procedure Notes (Signed)
Arterial Line Insertion Start/End6/01/2018 9:00 AM, 04/30/2018 9:15 AM Performed by: Jodell Ciproato, Mekhi Sonn A, CRNA, CRNA  Patient location: OR. Preanesthetic checklist: patient identified, IV checked, monitors and equipment checked and timeout performed Emergency situation Patient sedated Right, radial was placed Catheter size: 20 G Hand hygiene performed  and maximum sterile barriers used  Allen's test indicative of satisfactory collateral circulation Attempts: 1 Procedure performed without using ultrasound guided technique. Following insertion, Biopatch and dressing applied. Post procedure assessment: normal

## 2018-04-30 NOTE — ED Triage Notes (Signed)
Pt BIB [facility] for altered mental status and N/V. Pts last know normal was 04/29/2018 2100. Pt is alert to self. She is hypertensive per ems but no history of the same

## 2018-04-30 NOTE — Anesthesia Procedure Notes (Addendum)
Central Venous Catheter Insertion Performed by: Val EagleMoser, Alonnah Lampkins, MD, anesthesiologist Start/End6/01/2018 8:52 AM, 04/30/2018 8:57 AM Patient location: Pre-op. Preanesthetic checklist: patient identified, IV checked, site marked, risks and benefits discussed, surgical consent, monitors and equipment checked, pre-op evaluation, timeout performed and anesthesia consent Position: supine Hand hygiene performed  and maximum sterile barriers used  Catheter size: 8 Fr Total catheter length 16. Central line was placed.Double lumen Procedure performed using ultrasound guided technique. Ultrasound Notes:anatomy identified, needle tip was noted to be adjacent to the nerve/plexus identified, no ultrasound evidence of intravascular and/or intraneural injection and image(s) printed for medical record Attempts: 1 Following insertion, dressing applied, line sutured and Biopatch. Post procedure assessment: blood return through all ports, free fluid flow and no air  Patient tolerated the procedure well with no immediate complications.

## 2018-04-30 NOTE — ED Notes (Signed)
Chaplain paged for family. 

## 2018-04-30 NOTE — Brief Op Note (Signed)
04/30/2018  10:32 AM  PATIENT:  Tiffany Thornton  84 y.o. female  PRE-OPERATIVE DIAGNOSIS:  occipital subdural hematoma  POST-OPERATIVE DIAGNOSIS:  occipital subdural hematoma  PROCEDURE:  Procedure(s) with comments: CRANIOTOMY HEMATOMA EVACUATION SUBDURAL (N/A) - suboccipital craniectomy for hematoma  SURGEON:  Surgeon(s) and Role:    Julio Sicks* Elliemae Braman, MD - Primary  PHYSICIAN ASSISTANT:   ASSISTANTS: none   ANESTHESIA:   general  EBL:  250 mL   BLOOD ADMINISTERED:none  DRAINS: none   LOCAL MEDICATIONS USED:  NONE  SPECIMEN:  No Specimen  DISPOSITION OF SPECIMEN:  N/A  COUNTS:  YES  TOURNIQUET:  * No tourniquets in log *  DICTATION: .Dragon Dictation  PLAN OF CARE: Admit to inpatient   PATIENT DISPOSITION:  ICU - intubated and hemodynamically stable.   Delay start of Pharmacological VTE agent (>24hrs) due to surgical blood loss or risk of bleeding: yes

## 2018-04-30 NOTE — Anesthesia Procedure Notes (Signed)
Date/Time: 04/30/2018 8:48 AM Performed by: Jodell Ciproato, Luvern Mischke A, CRNA Pre-anesthesia Checklist: Patient identified, Emergency Drugs available, Suction available and Patient being monitored Induction Type: Inhalational induction with existing ETT Placement Confirmation: positive ETCO2 and breath sounds checked- equal and bilateral

## 2018-04-30 NOTE — Progress Notes (Signed)
Transported to the OR without any apparent complications 4N RT notified that this patient is going to surgery.

## 2018-04-30 NOTE — ED Notes (Signed)
Patient Lab results reported to Nurse Kehinde.

## 2018-04-30 NOTE — ED Notes (Addendum)
Neurologist at bedside.  Pt speaking, ao x 4.

## 2018-05-01 ENCOUNTER — Encounter (HOSPITAL_COMMUNITY): Payer: Self-pay | Admitting: Neurosurgery

## 2018-05-01 ENCOUNTER — Inpatient Hospital Stay (HOSPITAL_COMMUNITY): Payer: Medicare Other

## 2018-05-01 DIAGNOSIS — D72829 Elevated white blood cell count, unspecified: Secondary | ICD-10-CM

## 2018-05-01 DIAGNOSIS — I614 Nontraumatic intracerebral hemorrhage in cerebellum: Secondary | ICD-10-CM

## 2018-05-01 DIAGNOSIS — Z7189 Other specified counseling: Secondary | ICD-10-CM

## 2018-05-01 LAB — CBC
HEMATOCRIT: 33.7 % — AB (ref 36.0–46.0)
Hemoglobin: 11.2 g/dL — ABNORMAL LOW (ref 12.0–15.0)
MCH: 31.8 pg (ref 26.0–34.0)
MCHC: 33.2 g/dL (ref 30.0–36.0)
MCV: 95.7 fL (ref 78.0–100.0)
Platelets: 288 10*3/uL (ref 150–400)
RBC: 3.52 MIL/uL — ABNORMAL LOW (ref 3.87–5.11)
RDW: 13.6 % (ref 11.5–15.5)
WBC: 17.9 10*3/uL — AB (ref 4.0–10.5)

## 2018-05-01 LAB — BASIC METABOLIC PANEL
ANION GAP: 10 (ref 5–15)
BUN: 22 mg/dL — AB (ref 6–20)
CALCIUM: 7.7 mg/dL — AB (ref 8.9–10.3)
CO2: 17 mmol/L — ABNORMAL LOW (ref 22–32)
Chloride: 109 mmol/L (ref 101–111)
Creatinine, Ser: 1.13 mg/dL — ABNORMAL HIGH (ref 0.44–1.00)
GFR calc Af Amer: 51 mL/min — ABNORMAL LOW (ref >60)
GFR, EST NON AFRICAN AMERICAN: 44 mL/min — AB (ref >60)
Glucose, Bld: 148 mg/dL — ABNORMAL HIGH (ref 65–99)
POTASSIUM: 4.8 mmol/L (ref 3.5–5.1)
SODIUM: 136 mmol/L (ref 135–145)

## 2018-05-01 LAB — ECHOCARDIOGRAM COMPLETE
HEIGHTINCHES: 68.11 in
WEIGHTICAEL: 2160 [oz_av]

## 2018-05-01 MED ORDER — CLEVIDIPINE BUTYRATE 0.5 MG/ML IV EMUL
0.0000 mg/h | INTRAVENOUS | Status: DC
  Administered 2018-05-01: 10 mg/h via INTRAVENOUS
  Administered 2018-05-01: 11 mg/h via INTRAVENOUS
  Administered 2018-05-01: 12 mg/h via INTRAVENOUS
  Administered 2018-05-01: 2 mg/h via INTRAVENOUS
  Administered 2018-05-02 (×2): 13 mg/h via INTRAVENOUS
  Administered 2018-05-02: 12 mg/h via INTRAVENOUS
  Administered 2018-05-02: 7.5 mg/h via INTRAVENOUS
  Administered 2018-05-03: 4 mg/h via INTRAVENOUS
  Administered 2018-05-03: 2 mg/h via INTRAVENOUS
  Filled 2018-05-01 (×11): qty 50

## 2018-05-01 MED ORDER — HYDROMORPHONE HCL 1 MG/ML IJ SOLN
0.5000 mg | INTRAMUSCULAR | Status: DC | PRN
  Administered 2018-05-01 – 2018-05-07 (×7): 0.5 mg via INTRAVENOUS
  Filled 2018-05-01 (×7): qty 1

## 2018-05-01 MED ORDER — ORAL CARE MOUTH RINSE
15.0000 mL | Freq: Two times a day (BID) | OROMUCOSAL | Status: DC
  Administered 2018-05-01 – 2018-05-03 (×3): 15 mL via OROMUCOSAL

## 2018-05-01 NOTE — Progress Notes (Signed)
Postop day 1.  Patient extubated this morning without difficulty.  She has some mild incisional headache but no other complaints.  Anxious to get home.  Afebrile.  Vital signs are stable.  Patient awake and alert.  She is oriented and appropriate.  Very minimal dysarthria.  Motor 5/5 bilaterally.  Wound clean and dry.  Chest and abdomen benign.  Doing well following evacuation of cerebellar hematoma.  Begin mobilization with therapy.

## 2018-05-01 NOTE — Care Management Note (Signed)
Case Management Note  Patient Details  Name: Tiffany Thornton MRN: [id number] Date of Birth: 14-Jun-1933  Subjective/Objective:    Pt admitted on 04/30/18 s/p Rt cerebellar hemorrhage.  PTA, pt lived alone at home.                  Action/Plan: Pt extubated today; awaiting PT/OT consults.  Will follow progress.  Expected Discharge Date:                  Expected Discharge Plan:     In-House Referral:     Discharge planning Services  CM Consult  Post Acute Care Choice:    Choice offered to:     DME Arranged:    DME Agency:     HH Arranged:    HH Agency:     Status of Service:  In process, will continue to follow  If discussed at Long Length of Stay Meetings, dates discussed:    Additional Comments:  Quintella BatonJulie W. Ronin Crager, RN, BSN  Trauma/Neuro ICU Case Manager [phone]

## 2018-05-01 NOTE — Plan of Care (Signed)
Pt A&O x 4, able to communicate needs verbally without any difficulties. Pt able to participate in some self-care activities.

## 2018-05-01 NOTE — Progress Notes (Signed)
  Echocardiogram 2D Echocardiogram has been performed.  Roosvelt MaserLane, Sheena Donegan F 05/01/2018, 2:09 PM

## 2018-05-01 NOTE — Procedures (Signed)
Extubation Procedure Note  Patient Details:   Name: Tiffany Thornton DOB: 1933-08-03 MRN: [id number]   Airway Documentation:    Vent end date: 05/01/18 Vent end time: 1035   Evaluation  O2 sats: stable throughout Complications: No apparent complications Patient did tolerate procedure well. Bilateral Breath Sounds: Clear   Yes  PT was extubated to a 3l [place]  PT was able to speak and has a strong productive cough  Alaina Donati, Duane LopeJeffrey D 05/01/2018, 10:36 AM

## 2018-05-01 NOTE — Progress Notes (Signed)
STROKE TEAM PROGRESS NOTE   SUBJECTIVE (INTERVAL HISTORY) Her sons are at the bedside.  Overall she feels her condition is rapidly improving. She was awake alert and following all commands, only has mild dysarthria and right sided ataxia. During the encounter, pt had sudden onset incision site pain, considering related to stitches, but also did stat CT and showed excellent evacuation and no new abnormalities.    OBJECTIVE Temp:  [97.2 F (36.2 C)-99.4 F (37.4 C)] 97.8 F (36.6 C) (06/04 0800) Pulse Rate:  [50-108] 71 (06/04 1045) Cardiac Rhythm: Normal sinus rhythm (06/04 0800) Resp:  [17-22] 17 (06/04 1045) BP: (110-173)/(39-77) 134/54 (06/04 1045) SpO2:  [98 %-100 %] 98 % (06/04 1045) Arterial Line BP: (124-170)/(34-57) 139/43 (06/04 1045) FiO2 (%):  [40 %-50 %] 40 % (06/04 0843)  Recent Labs  Lab 04/30/18 0723  GLUCAP 174*   Recent Labs  Lab 04/30/18 0718 04/30/18 0734 04/30/18 1005 04/30/18 1138 05/01/18 0440  NA 137 137 138 135 136  K 3.8 3.7 3.9 4.0 4.8  CL 102 101  --  106 109  CO2 24  --   --  20* 17*  GLUCOSE 181* 178*  --  200* 148*  BUN 16 20  --  16 22*  CREATININE 1.01* 0.80  --  0.74 1.13*  CALCIUM 9.5  --   --  8.1* 7.7*   Recent Labs  Lab 04/30/18 0718  AST 28  ALT 18  ALKPHOS 62  BILITOT 0.4  PROT 7.2  ALBUMIN 4.2   Recent Labs  Lab 04/30/18 0718 04/30/18 0734 04/30/18 1005 04/30/18 1138 05/01/18 0440  WBC 12.5*  --   --  11.0* 17.9*  NEUTROABS 9.7*  --   --   --   --   HGB 14.2 15.3* 11.6* 12.8 11.2*  HCT 42.8 45.0 34.0* 38.6 33.7*  MCV 94.7  --   --  93.2 95.7  PLT 326  --   --  300 288   No results for input(s): CKTOTAL, CKMB, CKMBINDEX, TROPONINI in the last 168 hours. Recent Labs    04/30/18 0718  LABPROT 11.6  INR 0.86   Recent Labs    04/30/18 0718  COLORURINE STRAW*  LABSPEC 1.006  PHURINE 7.0  GLUCOSEU NEGATIVE  HGBUR NEGATIVE  BILIRUBINUR NEGATIVE  KETONESUR NEGATIVE  PROTEINUR 100*  NITRITE NEGATIVE   LEUKOCYTESUR NEGATIVE       Component Value Date/Time   TRIG 63 04/30/2018 1639   No results found for: HGBA1C No results found for: LABOPIA, COCAINSCRNUR, LABBENZ, AMPHETMU, THCU, LABBARB  No results for input(s): ETH in the last 168 hours.  I have personally reviewed the radiological images below and agree with the radiology interpretations.  Ct Head Wo Contrast  Result Date: 05/01/2018 CLINICAL DATA:  84 year old female with intracranial bleed post surgery with new headache and neck pain. Subsequent encounter. EXAM: CT HEAD WITHOUT CONTRAST TECHNIQUE: Contiguous axial images were obtained from the base of the skull through the vertex without intravenous contrast. COMPARISON:  04/30/2018 CT. FINDINGS: Brain: Post right occipital craniectomy for evacuation of right cerebellar hematoma. Residual blood collection in the right cerebellum now measures 0.6 x 1.5 x 2.1 cm versus prior 4 x 3.9 x 2.6 cm. Small amount of gas within the resection cavity. Mild surrounding vasogenic edema. Decrease mass effect upon the fourth ventricle. Scattered small areas of pneumocephalus bilaterally greatest frontal region without associated mass effect. No new intracranial hemorrhage or CT evidence of large acute infarct. Remote small left  thalamic infarct. Chronic microvascular changes. Vascular: Vascular calcifications Skull: Postsurgical changes occipital region. Sinuses/Orbits: No acute orbital abnormality. Visualized paranasal sinuses are clear. Other: Mastoid air cells and middle ear cavities are clear. IMPRESSION: Post right occipital craniectomy for evacuation of right cerebellar hematoma. Residual blood collection in the right cerebellum now measures 0.6 x 1.5 x 2.1 cm versus prior 4 x 3.9 x 2.6 cm. Small amount of gas within the resection cavity. Mild surrounding vasogenic edema. Decrease mass effect upon the fourth ventricle. Scattered small areas of pneumocephalus bilaterally greatest frontal region without  associated mass effect. No new intracranial hemorrhage or CT evidence of large acute infarct. Electronically Signed   By: Lacy Duverney M.D.   On: 05/01/2018 10:28   Ct Head Wo Contrast  Result Date: 04/30/2018 CLINICAL DATA:  84 year old female with altered mental status. Initial encounter. EXAM: CT HEAD WITHOUT CONTRAST TECHNIQUE: Contiguous axial images were obtained from the base of the skull through the vertex without intravenous contrast. COMPARISON:  None. FINDINGS: Brain: 4 x 3.9 x 2.6 cm right cerebellar hemorrhage with surrounding vasogenic edema and local mass effect. Compression of the fourth ventricle. Patient at risk for development of hydrocephalus. Mild superior displacement of the vermis. Minimal inferior displacement right cerebellar tonsil. No other intracranial mass lesion seen on present unenhanced head CT. Remote left thalamic infarct. Moderate chronic microvascular changes. Mild atrophy. Vascular: Vascular calcifications. Skull: No skull fracture. Sinuses/Orbits: No acute orbital abnormality. Visualized paranasal sinuses are clear. Other: Mastoid air cells and middle ear cavities are clear. IMPRESSION: 4 x 3.9 x 2.6 cm right cerebellar hemorrhage with surrounding vasogenic edema and local mass effect. Compression of the fourth ventricle. Patient at risk for development of hydrocephalus. Mild superior displacement of the vermis. Minimal inferior displacement right cerebellar tonsil. Cerebellar hemorrhage may be related to hypertensive bleed. Underlying lesion not excluded. Remote left thalamic infarct. Moderate chronic microvascular changes. These results were called by telephone at the time of interpretation on 04/30/2018 at 7:30 am to Dr. Ilda Basset who verbally acknowledged these results. Electronically Signed   By: Lacy Duverney M.D.   On: 04/30/2018 07:42   Dg Chest Portable 1 View  Result Date: 04/30/2018 CLINICAL DATA:  Hypoxia EXAM: PORTABLE CHEST 1 VIEW COMPARISON:  None. FINDINGS:  Endotracheal tube tip is 1.2 cm above the carina. Nasogastric tube tip and side port are below the diaphragm. No pneumothorax. No edema or consolidation. Heart size and pulmonary vascularity are normal. No adenopathy. No bone lesions. IMPRESSION: Tube positions as described without pneumothorax. Note that the endotracheal tube is near the carina. It may be prudent to consider withdrawing endotracheal tube 2-3 cm. No edema or consolidation. Heart size normal. There is aortic atherosclerosis. Aortic Atherosclerosis (ICD10-I70.0). Electronically Signed   By: Bretta Bang III M.D.   On: 04/30/2018 08:17   TTE pending   PHYSICAL EXAM  Temp:  [97.2 F (36.2 C)-99.4 F (37.4 C)] 97.8 F (36.6 C) (06/04 0800) Pulse Rate:  [50-108] 71 (06/04 1045) Resp:  [17-22] 17 (06/04 1045) BP: (110-173)/(39-77) 134/54 (06/04 1045) SpO2:  [98 %-100 %] 98 % (06/04 1045) Arterial Line BP: (124-170)/(34-57) 139/43 (06/04 1045) FiO2 (%):  [40 %-50 %] 40 % (06/04 0843)  General - Well nourished, well developed, mildly lethargic.  Ophthalmologic - fundi not visualized due to noncooperation.  Cardiovascular - Regular rate and rhythm  Mental Status -  Level of arousal and orientation to time, place, and person were intact. Language including expression, naming, repetition, comprehension was assessed and  found intact. However, mild to moderate dysarthria  Cranial Nerves II - XII - II - Visual field intact OU. III, IV, VI - Extraocular movements intact. V - Facial sensation intact bilaterally. VII - Facial movement intact bilaterally. VIII - Hearing & vestibular intact bilaterally. X - Palate elevates symmetrically, mild to moderate dysarthria. XI - Chin turning & shoulder shrug intact bilaterally. XII - Tongue protrusion intact.  Motor Strength - The patient's strength was normal in all extremities and pronator drift was absent.  Bulk was normal and fasciculations were absent.   Motor Tone - Muscle tone  was assessed at the neck and appendages and was normal.  Reflexes - The patient's reflexes were symmetrical in all extremities and she had no pathological reflexes.  Sensory - Light touch, temperature/pinprick were assessed and were symmetrical.    Coordination - The patient had right FTN ataxia, but LEs not significant ataxia  Tremor was absent.  Gait and Station - deferred.   ASSESSMENT/PLAN Tiffany Thornton is a 84 y.o. female with no significant history admitted for vertigo, N/V. No tPA given due to ICH.    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  Resultant right ataxia and HA  CT head right cerebellar large ICH  CT head repeat - s/p evacuation and mass effect much improved.   Consider MRI with and without contrast and MRA once stable to rule out hemorrhagic infarct, brain tumor or AVM   2D Echo  pending  LDL pending  HgbA1c pending  SCDs for VTE prophylaxis  NPO for now - pending speech  aspirin 81 mg daily prior to admission, now on No antithrombotic due to ICH  Ongoing aggressive stroke risk factor management  Therapy recommendations:  pending  Disposition:  pending  Hypertension Stable on cardene   BP goal < 160  Change cardene to cleviprex for volume control  Wean off if able  Consider PO BP meds after passing swallow  Other Stroke Risk Factors  Advanced age  Other Active Problems  Elevated cre 1.13  Leukocytosis WBC 17.9  Hospital day # 1  This patient is critically ill due to large cerebellar infarct, s/p evacuation, hypertensive emergency and at significant risk of neurological worsening, death form rebleed, ischemic stroke, seizure, cerebral edema, brain herniation. This patient's care requires constant monitoring of vital signs, hemodynamics, respiratory and cardiac monitoring, review of multiple databases, neurological assessment, discussion with family, other specialists and medical  decision making of high complexity. I spent 45 minutes of neurocritical care time in the care of this patient. I had long discussion with spms at bedside, updated pt current condition, treatment plan and potential prognosis. They expressed understanding and appreciation.   Tiffany PlanJindong Navpreet Szczygiel, MD PhD Stroke Neurology 05/01/2018 11:13 AM    To contact Stroke Continuity provider, please refer to [email or web address]. After hours, contact General Neurology

## 2018-05-01 NOTE — Progress Notes (Signed)
PULMONARY / CRITICAL CARE MEDICINE   Name: Yehuda Budddith S [place] MRN: [id number] DOB: 05-Mar-1933    ADMISSION DATE:  04/30/2018 CONSULTATION DATE:  6/3  REFERRING MD:  Pool (nsgy)   CHIEF COMPLAINT:  ICH   HISTORY OF PRESENT ILLNESS:   84yo female with minimal PMH presented 6/3 with AMS, headache, n/v.  No trauma. CT head revealed acute R cerebellar hypertensive hemorrhage with significant brainstem compression.  She was taken for emergent suboccipital craniectomy and evacuation of hemorrhage.  She remained intubated post op and PCCM consulted for vent management.   SUBJECTIVE:  No events overnight, NAD  VITAL SIGNS: BP (!) 128/47   Pulse 60   Temp 97.8 F (36.6 C) (Axillary)   Resp 18   Ht 5' 8.11" (1.73 m)   Wt 135 lb (61.2 kg)   SpO2 100%   BMI 20.46 kg/m   HEMODYNAMICS:    VENTILATOR SETTINGS: Vent Mode: CPAP;PSV FiO2 (%):  [40 %-100 %] 40 % Set Rate:  [16 bmp-18 bmp] 18 bmp Vt Set:  [500 mL-510 mL] 500 mL PEEP:  [5 cmH20] 5 cmH20 Pressure Support:  [5 cmH20] 5 cmH20 Plateau Pressure:  [10 cmH20-16 cmH20] 16 cmH20  INTAKE / OUTPUT: I/O last 3 completed shifts: In: 3931.8 [I.V.:3831.8; IV Piggyback:100] Out: 2050 [Urine:1800; Blood:250]  PHYSICAL EXAMINATION: General:  Thin, elderly appearing, NAD Neuro:  Awake and following command HEENT:  [place]/AT, PERRL, EOM-I and MMM Cardiovascular:  RRR, Nl S1/S2 and -M/R/G Lungs:  CTA bilaterally Abdomen:  Soft, NT, ND and +BS Musculoskeletal:  -edema and -tenderness  LABS:  BMET Recent Labs  Lab 04/30/18 0718 04/30/18 0734 04/30/18 1005 04/30/18 1138 05/01/18 0440  NA 137 137 138 135 136  K 3.8 3.7 3.9 4.0 4.8  CL 102 101  --  106 109  CO2 24  --   --  20* 17*  BUN 16 20  --  16 22*  CREATININE 1.01* 0.80  --  0.74 1.13*  GLUCOSE 181* 178*  --  200* 148*   Electrolytes Recent Labs  Lab 04/30/18 0718 04/30/18 1138 05/01/18 0440  CALCIUM 9.5 8.1* 7.7*   CBC Recent Labs  Lab 04/30/18 0718  04/30/18 1005  04/30/18 1138 05/01/18 0440  WBC 12.5*  --   --  11.0* 17.9*  HGB 14.2   < > 11.6* 12.8 11.2*  HCT 42.8   < > 34.0* 38.6 33.7*  PLT 326  --   --  300 288   < > = values in this interval not displayed.   Coag's Recent Labs  Lab 04/30/18 0718  INR 0.86   Sepsis Markers Recent Labs  Lab 04/30/18 0735  LATICACIDVEN 2.01*   ABG Recent Labs  Lab 04/30/18 1005 04/30/18 1246  PHART 7.337* 7.343*  PCO2ART 40.5 38.4  PO2ART 269.0* 208.0*   Liver Enzymes Recent Labs  Lab 04/30/18 0718  AST 28  ALT 18  ALKPHOS 62  BILITOT 0.4  ALBUMIN 4.2   Cardiac Enzymes No results for input(s): TROPONINI, PROBNP in the last 168 hours.  Glucose Recent Labs  Lab 04/30/18 0723  GLUCAP 174*   Imaging I reviewed CXR myself, ETT is in good position  STUDIES:  CT head 6/3>>> 4 x 3.9 x 2.6 cm right cerebellar hemorrhage with surrounding vasogenic edema and local mass effect. Compression of the fourth ventricle. Patient at risk for development of hydrocephalus. Mild superior displacement of the vermis. Minimal inferior displacement right cerebellar tonsil.  Cerebellar hemorrhage may be related to  hypertensive bleed. Underlying lesion not excluded.  Remote left thalamic infarct. Moderate chronic microvascular changes.  CULTURES: None  ANTIBIOTICS: None  SIGNIFICANT EVENTS: 6/3>> suboccipital craniectomy for acute ICH  LINES/TUBES: ETT 6/3>>>6/4  DISCUSSION: 84yo female with acute ICH with mass effect s/p craniectomy 6/3. VDRF post op.   ASSESSMENT / PLAN:  NEUROLOGIC Large cerebellar ICH - ?? R/t HTN. S/p craniectomy 6/3 P:   D/C all sedation PRN fentanyl for pain Post op care per nsgy  Neuro following  F/u CT per neurosurgery BP control as below - cardene  PULMONARY VDRF in setting AMS r/t large ICH  P:   Extubate If fails then will reintubate but no long term trach/peg CXR and ABG to PRN at this point Titrate O2 for sat of 88-92% IS per RT  protocol OOB  CARDIOVASCULAR HTN urgency- unclear if cause of or response to large ICH  P:  PRN labetalol  Cardene gtt - goal SBP <160  RENAL Hypokalemia  P:   F/u chem in am  Replace electrolytes as indicated KVO IVF after patient is able to take PO, for now continue 75 ml/hr  GASTROINTESTINAL No active issue  P:   SLP NPO PPI   HEMATOLOGIC ICH  P:  Hold home ASA, defer to neurosurgery at this point SCD's   INFECTIOUS No active issue  P:   Peri-op ancef  F/u wbc, fever curve   ENDOCRINE Hx hypothyroid    P:   F/u TSH  Continue home synthroid  FAMILY  - Updates: Family updated bedside, plan is to extubate, if fails the reintubate for a few more days, if after swelling is down and we extubate the patient fails then will proceed with comfort.  No trach/peg.  - Inter-disciplinary family meet or Palliative Care meeting due by:  Day 7   The patient is critically ill with multiple organ systems failure and requires high complexity decision making for assessment and support, frequent evaluation and titration of therapies, application of advanced monitoring technologies and extensive interpretation of multiple databases.   Critical Care Time devoted to patient care services described in this note is  37  Minutes. This time reflects time of care of this signee Dr Koren Bound. This critical care time does not reflect procedure time, or teaching time or supervisory time of PA/NP/Med student/Med Resident etc but could involve care discussion time.  Alyson Reedy, M.D. [facility] Pulmonary/Critical Care Medicine. Pager: [phone]. After hours pager: [phone].

## 2018-05-02 ENCOUNTER — Encounter (HOSPITAL_COMMUNITY): Payer: Self-pay

## 2018-05-02 DIAGNOSIS — D72829 Elevated white blood cell count, unspecified: Secondary | ICD-10-CM

## 2018-05-02 DIAGNOSIS — I161 Hypertensive emergency: Secondary | ICD-10-CM

## 2018-05-02 DIAGNOSIS — G936 Cerebral edema: Secondary | ICD-10-CM

## 2018-05-02 DIAGNOSIS — R112 Nausea with vomiting, unspecified: Secondary | ICD-10-CM

## 2018-05-02 DIAGNOSIS — E785 Hyperlipidemia, unspecified: Secondary | ICD-10-CM

## 2018-05-02 LAB — CBC
HEMATOCRIT: 33.5 % — AB (ref 36.0–46.0)
HEMOGLOBIN: 11.2 g/dL — AB (ref 12.0–15.0)
MCH: 31.7 pg (ref 26.0–34.0)
MCHC: 33.4 g/dL (ref 30.0–36.0)
MCV: 94.9 fL (ref 78.0–100.0)
Platelets: 276 10*3/uL (ref 150–400)
RBC: 3.53 MIL/uL — AB (ref 3.87–5.11)
RDW: 13.7 % (ref 11.5–15.5)
WBC: 17.5 10*3/uL — ABNORMAL HIGH (ref 4.0–10.5)

## 2018-05-02 LAB — LIPID PANEL
CHOL/HDL RATIO: 3.8 ratio
Cholesterol: 183 mg/dL (ref 0–200)
HDL: 48 mg/dL (ref >40)
LDL CALC: 98 mg/dL (ref 0–99)
TRIGLYCERIDES: 186 mg/dL — AB (ref <150)
VLDL: 37 mg/dL (ref 0–40)

## 2018-05-02 LAB — GLUCOSE, CAPILLARY
Glucose-Capillary: 123 mg/dL — ABNORMAL HIGH (ref 65–99)
Glucose-Capillary: 117 mg/dL — ABNORMAL HIGH (ref 65–99)
Glucose-Capillary: 117 mg/dL — ABNORMAL HIGH (ref 65–99)

## 2018-05-02 LAB — BASIC METABOLIC PANEL
Anion gap: 5 (ref 5–15)
BUN: 22 mg/dL — AB (ref 6–20)
CHLORIDE: 112 mmol/L — AB (ref 101–111)
CO2: 19 mmol/L — AB (ref 22–32)
CREATININE: 1 mg/dL (ref 0.44–1.00)
Calcium: 7.9 mg/dL — ABNORMAL LOW (ref 8.9–10.3)
GFR calc Af Amer: 59 mL/min — ABNORMAL LOW (ref >60)
GFR calc non Af Amer: 51 mL/min — ABNORMAL LOW (ref >60)
GLUCOSE: 138 mg/dL — AB (ref 65–99)
POTASSIUM: 5.1 mmol/L (ref 3.5–5.1)
SODIUM: 136 mmol/L (ref 135–145)

## 2018-05-02 LAB — VITAMIN B12: VITAMIN B 12: 483 pg/mL (ref 180–914)

## 2018-05-02 LAB — HEMOGLOBIN A1C
HEMOGLOBIN A1C: 5.8 % — AB (ref 4.8–5.6)
Mean Plasma Glucose: 119.76 mg/dL

## 2018-05-02 LAB — PHOSPHORUS: Phosphorus: 2.7 mg/dL (ref 2.5–4.6)

## 2018-05-02 LAB — TSH: TSH: 3.064 u[IU]/mL (ref 0.350–4.500)

## 2018-05-02 LAB — MAGNESIUM: Magnesium: 2.1 mg/dL (ref 1.7–2.4)

## 2018-05-02 MED ORDER — LEVOTHYROXINE SODIUM 100 MCG PO TABS
100.0000 ug | ORAL_TABLET | Freq: Every day | ORAL | Status: DC
  Administered 2018-05-03 – 2018-05-07 (×5): 100 ug via ORAL
  Filled 2018-05-02 (×6): qty 1

## 2018-05-02 MED ORDER — AMLODIPINE BESYLATE 5 MG PO TABS
5.0000 mg | ORAL_TABLET | Freq: Every day | ORAL | Status: DC
  Administered 2018-05-02: 5 mg via ORAL
  Filled 2018-05-02: qty 1

## 2018-05-02 MED ORDER — ADULT MULTIVITAMIN W/MINERALS CH
1.0000 | ORAL_TABLET | Freq: Every day | ORAL | Status: DC
  Administered 2018-05-02 – 2018-05-07 (×6): 1 via ORAL
  Filled 2018-05-02 (×6): qty 1

## 2018-05-02 MED ORDER — INSULIN ASPART 100 UNIT/ML ~~LOC~~ SOLN
0.0000 [IU] | Freq: Three times a day (TID) | SUBCUTANEOUS | Status: DC
  Administered 2018-05-02 – 2018-05-06 (×6): 1 [IU] via SUBCUTANEOUS
  Administered 2018-05-06: 2 [IU] via SUBCUTANEOUS
  Administered 2018-05-07: 1 [IU] via SUBCUTANEOUS

## 2018-05-02 MED ORDER — AMLODIPINE BESYLATE 5 MG PO TABS: 5.0000 mg | ORAL_TABLET | Freq: Every day | ORAL | Status: DC

## 2018-05-02 MED ORDER — HEPARIN SODIUM (PORCINE) 5000 UNIT/ML IJ SOLN
5000.0000 [IU] | Freq: Two times a day (BID) | INTRAMUSCULAR | Status: DC
  Administered 2018-05-02 – 2018-05-07 (×11): 5000 [IU] via SUBCUTANEOUS
  Filled 2018-05-02 (×10): qty 1

## 2018-05-02 MED ORDER — LABETALOL HCL 5 MG/ML IV SOLN
10.0000 mg | INTRAVENOUS | Status: DC | PRN
  Administered 2018-05-02: 10 mg via INTRAVENOUS
  Administered 2018-05-03 (×3): 20 mg via INTRAVENOUS
  Filled 2018-05-02 (×4): qty 4

## 2018-05-02 MED ORDER — LISINOPRIL 20 MG PO TABS
20.0000 mg | ORAL_TABLET | Freq: Every day | ORAL | Status: DC
  Administered 2018-05-02: 20 mg via ORAL
  Filled 2018-05-02: qty 1

## 2018-05-02 MED ORDER — SODIUM CHLORIDE 0.9 % IV SOLN
INTRAVENOUS | Status: DC
  Administered 2018-05-02 – 2018-05-05 (×4): via INTRAVENOUS

## 2018-05-02 MED ORDER — HYDROCODONE-ACETAMINOPHEN 5-325 MG PO TABS
1.0000 | ORAL_TABLET | ORAL | Status: DC | PRN
  Administered 2018-05-02 – 2018-05-03 (×4): 2 via ORAL
  Administered 2018-05-04 (×3): 1 via ORAL
  Filled 2018-05-02: qty 2
  Filled 2018-05-02 (×2): qty 1
  Filled 2018-05-02 (×3): qty 2
  Filled 2018-05-02: qty 1
  Filled 2018-05-02: qty 2

## 2018-05-02 MED ORDER — LEVOTHYROXINE SODIUM 100 MCG PO TABS: 100.0000 ug | ORAL_TABLET | Freq: Every day | ORAL | Status: DC

## 2018-05-02 MED ORDER — SODIUM CHLORIDE 1 G PO TABS
2.0000 g | ORAL_TABLET | Freq: Three times a day (TID) | ORAL | Status: DC
  Administered 2018-05-02 – 2018-05-04 (×6): 2 g via ORAL
  Filled 2018-05-02 (×6): qty 2

## 2018-05-02 MED FILL — Thrombin For Soln 5000 Unit: CUTANEOUS | Qty: 5000 | Status: AC

## 2018-05-02 MED FILL — Gelatin Absorbable MT Powder: OROMUCOSAL | Qty: 1 | Status: AC

## 2018-05-02 NOTE — Evaluation (Addendum)
Occupational Therapy Evaluation Patient Details Name: Tiffany Thornton MRN: [id number] DOB: 1932/12/24 Today's Date: 05/02/2018    History of Present Illness [name] is a 84 y.o. female with no significant history admitted for vertigo, N/V. S/p suboccipital craniectomy for cerebellar hematoma.   Clinical Impression   PTA Pt extremely independent - drives, all ADL/IADL etc. 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 Pt is currently mod A generally for ADL - limited at this time by dizziness and nausea which she describes as "spinning" unable to keep eyes open at this time for visual assessment (continue to monitor). Pt performed transfers at +2 for assist/safety and did better with RW to avoid strong posterior lean. Pt will benefit from skilled OT in the acute setting as well as CIR level to maximize safety and independence in ADL and functional transfers and return to PLOF. Excellent family support can provide 24 hour supervision at dc. Next session to focus on progressing transfers for toileting as well as positional change tolerance for functional ADL tasks and visual assessment.    BP throughout session were as follows: Supine - 139/56 Sitting EOB 160/62 Sitting EOB after 3 min 158/61 Standing 153/52 O2 dropped to 86% without good wave form on 4L, 91% when OT initiated therapy.   Follow Up Recommendations  CIR    Equipment Recommendations  Other (comment)(defer to next venue of care)    Recommendations for Other Services Rehab consult     Precautions / Restrictions Precautions Precautions: Fall Restrictions Weight Bearing Restrictions: No      Mobility Bed Mobility Overal bed mobility: Needs Assistance Bed Mobility: Supine to Sit;Sit to Supine     Supine to sit: Mod assist(vc for sequencing, assist for scooting hips EOB) Sit to supine: Min guard   General bed mobility comments: Pt constantly complaining of dizziness throughout session  Transfers Overall transfer level: Needs  assistance Equipment used: Rolling walker (2 wheeled);1 person hand held assist Transfers: Sit to/from Stand Sit to Stand: Min assist;+2 physical assistance;+2 safety/equipment         General transfer comment: initially stood face to face one person and Pt with VERY strong posterior lean that required immediate blocking out of legs to prevent sliding. Second attempt with 2 person and RW was much better, slight posterior lean - required vc for safe hand placement    Balance Overall balance assessment: Needs assistance Sitting-balance support: Bilateral upper extremity supported;Feet supported Sitting balance-Leahy Scale: Poor Sitting balance - Comments: requires external support and cues to open eyes Postural control: Posterior lean Standing balance support: Bilateral upper extremity supported Standing balance-Leahy Scale: Poor Standing balance comment: dependent on external support from staff or RW                           ADL either performed or assessed with clinical judgement   ADL Overall ADL's : Needs assistance/impaired Eating/Feeding: Set up;Bed level Eating/Feeding Details (indicate cue type and reason): Pt able to bring hand to mouth Grooming: Moderate assistance;Sitting Grooming Details (indicate cue type and reason): supported sitting Upper Body Bathing: Moderate assistance   Lower Body Bathing: Moderate assistance;Sitting/lateral leans Lower Body Bathing Details (indicate cue type and reason): Pt able to bring LLE cross to knees with mod A for sitting balance Upper Body Dressing : Moderate assistance   Lower Body Dressing: Minimal assistance;+2 for safety/equipment;+2 for physical assistance;Sit to/from stand   Toilet Transfer: Minimal assistance;+2 for physical assistance;+2 for safety/equipment;RW(sit to stand  only this session) Toilet Transfer Details (indicate cue type and reason): strong posterior lean with face to face, much safer and better balance  with RW - vc for safe hand placement Toileting- Clothing Manipulation and Hygiene: Maximal assistance;Sit to/from stand         General ADL Comments: limited by dizziness/nausea throughout assessment; trouble keeping eyes open at this time throughout session despite cues     Vision Baseline Vision/History: Wears glasses Wears Glasses: Reading only Patient Visual Report: Other (comment)(room spinning/dizziness) Vision Assessment?: Vision impaired- to be further tested in functional context Additional Comments: Pt unable to keep eyes open long enough for visual assessment - no nystagmus noted this session when Pt was attempting to keep eyes open. Eyes the MOST open while in standing     Perception     Praxis      Pertinent Vitals/Pain Pain Assessment: Faces Faces Pain Scale: Hurts little more Pain Location: back of head (incision site) Pain Descriptors / Indicators: Headache Pain Intervention(s): Monitored during session;Repositioned     Hand Dominance Right   Extremity/Trunk Assessment Upper Extremity Assessment Upper Extremity Assessment: Overall WFL for tasks assessed;Generalized weakness   Lower Extremity Assessment Lower Extremity Assessment: Defer to PT evaluation       Communication Communication Communication: No difficulties   Cognition Arousal/Alertness: Awake/alert(but trouble keeping eyes open) Behavior During Therapy: Anxious;Flat affect Overall Cognitive Status: Impaired/Different from baseline Area of Impairment: Problem solving;Safety/judgement;Following commands                       Following Commands: Follows one step commands with increased time;Follows one step commands consistently Safety/Judgement: Decreased awareness of deficits   Problem Solving: Decreased initiation;Requires verbal cues     General Comments       Exercises     Shoulder Instructions      Home Living Family/patient expects to be discharged to:: Private  residence Living Arrangements: Alone Available Help at Discharge: Family;Available 24 hours/day Type of Home: House Home Access: Stairs to enter Entergy CorporationEntrance Stairs-Number of Steps: 1 Entrance Stairs-Rails: None Home Layout: One level     Bathroom Shower/Tub: Producer, television/film/videoWalk-in shower   Bathroom Toilet: Standard     Home Equipment: None          Prior Functioning/Environment Level of Independence: Independent        Comments: 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, former Charity fundraiserN at [employer]        OT Problem List: Decreased activity tolerance;Impaired balance (sitting and/or standing);Impaired vision/perception;Decreased cognition;Decreased safety awareness;Decreased knowledge of use of DME or AE;Pain      OT Treatment/Interventions: Self-care/ADL training;Therapeutic exercise;DME and/or AE instruction;Therapeutic activities;Visual/perceptual remediation/compensation;Patient/family education;Balance training    OT Goals(Current goals can be found in the care plan section) Acute Rehab OT Goals Patient Stated Goal: to get independent again OT Goal Formulation: With patient Time For Goal Achievement: 05/16/18 Potential to Achieve Goals: Good ADL Goals Pt Will Perform Grooming: with modified independence;sitting Pt Will Perform Upper Body Bathing: with modified independence;sitting Pt Will Perform Lower Body Bathing: with modified independence;sit to/from stand;with adaptive equipment Pt Will Transfer to Toilet: ambulating;with supervision Pt Will Perform Toileting - Clothing Manipulation and hygiene: with supervision;sit to/from stand  OT Frequency: Min 2X/week   Barriers to D/C:            Co-evaluation              AM-PAC PT "6 Clicks" Daily Activity     Outcome Measure Help  from another person eating meals?: A Little Help from another person taking care of personal grooming?: A Lot Help from another person toileting, which includes using toliet, bedpan, or urinal?: A Lot Help  from another person bathing (including washing, rinsing, drying)?: A Lot Help from another person to put on and taking off regular upper body clothing?: A Lot Help from another person to put on and taking off regular lower body clothing?: A Lot 6 Click Score: 13   End of Session Equipment Utilized During Treatment: Gait belt;Rolling walker;Oxygen Nurse Communication: Mobility status  Activity Tolerance: Other (comment)(limited by dizziness) Patient left: in bed;with call bell/phone within reach;Other (comment);with family/visitor present;with nursing/sitter in room;with SCD's reapplied(4 rails up by Pt request)  OT Visit Diagnosis: Unsteadiness on feet (R26.81);Other abnormalities of gait and mobility (R26.89);Dizziness and giddiness (R42);Pain Pain - Right/Left: (central) Pain - part of body: (back of head)                Time: 4098-1191 OT Time Calculation (min): 32 min Charges:  OT General Charges $OT Visit: 1 Visit OT Evaluation $OT Eval Moderate Complexity: 1 Mod OT Treatments $Self Care/Home Management : 8-22 mins G-Codes:     Sherryl Manges OTR/L [phone]  Evern Bio Garhett Bernhard 05/02/2018, 4:14 PM   Addendum: Added in Vital Signs in the Clinical Impression Section

## 2018-05-02 NOTE — Progress Notes (Signed)
SLP Cancellation Note  Patient Details Name: Tiffany Thornton MRN: [id number] DOB: [birth year]   Cancelled treatment:       Reason Eval/Treat Not Completed: SLP screened, no needs identified, will sign off. Pt intubated for a little over 24 hours, MD ordered swallow eval post extubation, but pt passed RN stroke swallow and was started on diet without any difficulty. Will defer swallow eval. Pt does not have orders to address cognition/speech/language. If assessment is desired, please order SLP eval and treat and select cognitive linguistic evaluation.    Rashel Okeefe, Riley NearingBonnie Caroline 05/02/2018, 9:25 AM

## 2018-05-02 NOTE — Progress Notes (Signed)
Rehab Admissions Coordinator Note:  Patient was screened by Clois DupesBoyette, Alyce Inscore Godwin for appropriateness for an Inpatient Acute Rehab Consult per PT and OT recommendations.  At this time, we are recommending Inpatient Rehab consult.  Clois DupesBoyette, Wylee Dorantes Godwin 05/02/2018, 4:36 PM  I can be reached at [phone].

## 2018-05-02 NOTE — Evaluation (Signed)
Physical Therapy Evaluation Patient Details Name: Tiffany Thornton MRN: [id number] DOB: 01-Dec-1932 Today's Date: 05/02/2018   History of Present Illness  Tiffany Thornton is a 84 y.o. female with no significant history admitted for vertigo, N/V. S/p suboccipital craniectomy for cerebellar hematoma.  Clinical Impression  Pt admitted with/for vertigo due to cerebellar hematoma, s/p crani.  Pt needing significant assist for any mobility due to vertigo, incoordination and nausea..  Pt currently limited functionally due to the problems listed. ( See problems list.)   Pt will benefit from PT to maximize function and safety in order to get ready for next venue listed below.     Follow Up Recommendations CIR;Supervision/Assistance - 24 hour    Equipment Recommendations  Other (comment)(TBD)    Recommendations for Other Services Rehab consult     Precautions / Restrictions Precautions Precautions: Fall Restrictions Weight Bearing Restrictions: No      Mobility  Bed Mobility Overal bed mobility: Needs Assistance Bed Mobility: Supine to Sit;Sit to Supine     Supine to sit: Mod assist(vc for sequencing, assist for scooting hips EOB) Sit to supine: Mod assist   General bed mobility comments: Pt constantly complaining of dizziness throughout session  Transfers Overall transfer level: Needs assistance Equipment used: 1 person hand held assist Transfers: Sit to/from Stand;Stand Pivot Transfers Sit to Stand: +2 safety/equipment;Mod assist         General transfer comment: used face to face assist.  pt's knee were guarded due to patient unable to coordinate stepping/pivoting to the chair.  Ambulation/Gait                Stairs            Wheelchair Mobility    Modified Rankin (Stroke Patients Only)       Balance Overall balance assessment: Needs assistance Sitting-balance support: Bilateral upper extremity supported;Feet supported Sitting balance-Leahy Scale:  Poor Sitting balance - Comments: requires external support and cues to open eyes Postural control: Posterior lean Standing balance support: Bilateral upper extremity supported Standing balance-Leahy Scale: Poor Standing balance comment: dependent on external support from staff or RW                             Pertinent Vitals/Pain Pain Assessment: Faces Faces Pain Scale: Hurts little more Pain Location: back of head (incision site) Pain Descriptors / Indicators: Headache    Home Living Family/patient expects to be discharged to:: Private residence Living Arrangements: Alone Available Help at Discharge: Family;Available 24 hours/day Type of Home: House Home Access: Stairs to enter Entrance Stairs-Rails: None Entrance Stairs-Number of Steps: 1 Home Layout: One level Home Equipment: None      Prior Function Level of Independence: Independent         Comments: 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, former Charity fundraiser at [employer]   Dominant Hand: Right    Extremity/Trunk Assessment   Upper Extremity Assessment Upper Extremity Assessment: Defer to OT evaluation    Lower Extremity Assessment Lower Extremity Assessment: Overall WFL for tasks assessed;RLE deficits/detail;LLE deficits/detail RLE Coordination: decreased fine motor LLE Sensation: decreased light touch LLE Coordination: decreased fine motor       Communication   Communication: No difficulties  Cognition Arousal/Alertness: Awake/alert(but trouble keeping eyes open) Behavior During Therapy: Anxious;Flat affect Overall Cognitive Status: Impaired/Different from baseline Area of Impairment: Problem solving;Safety/judgement;Following commands  Following Commands: Follows one step commands with increased time;Follows one step commands consistently Safety/Judgement: Decreased awareness of deficits   Problem Solving: Decreased initiation;Requires verbal cues         General Comments General comments (skin integrity, edema, etc.): BP 160/89, HR in the 80's and rr 21, sats on RA 93%    Exercises     Assessment/Plan    PT Assessment Patient needs continued PT services  PT Problem List Decreased strength;Decreased activity tolerance;Decreased balance;Decreased mobility;Decreased coordination       PT Treatment Interventions Gait training;DME instruction;Functional mobility training;Therapeutic activities;Balance training;Neuromuscular re-education;Patient/family education    PT Goals (Current goals can be found in the Care Plan section)  Acute Rehab PT Goals Patient Stated Goal: to get independent again PT Goal Formulation: With patient Time For Goal Achievement: 05/16/18 Potential to Achieve Goals: Good    Frequency Min 3X/week   Barriers to discharge        Co-evaluation               AM-PAC PT "6 Clicks" Daily Activity  Outcome Measure Difficulty turning over in bed (including adjusting bedclothes, sheets and blankets)?: Unable Difficulty moving from lying on back to sitting on the side of the bed? : Unable Difficulty sitting down on and standing up from a chair with arms (e.g., wheelchair, bedside commode, etc,.)?: Unable Help needed moving to and from a bed to chair (including a wheelchair)?: A Lot Help needed walking in hospital room?: A Lot Help needed climbing 3-5 steps with a railing? : A Lot 6 Click Score: 9    End of Session   Activity Tolerance: Other (comment)(limited by nausea and inability to tolerate upright) Patient left: in chair;with call bell/phone within reach;with family/visitor present Nurse Communication: Mobility status PT Visit Diagnosis: Unsteadiness on feet (R26.81);Other abnormalities of gait and mobility (R26.89);Other symptoms and signs involving the nervous system (R29.898)    Time: 1025-1100 PT Time Calculation (min) (ACUTE ONLY): 35 min   Charges:   PT Evaluation $PT Eval Moderate  Complexity: 1 Mod PT Treatments $Therapeutic Activity: 8-22 mins   PT G Codes:        05/02/2018  [place], PT [phone]  (pager)  Tiffany Thornton 05/02/2018, 10:21 PM

## 2018-05-02 NOTE — Progress Notes (Signed)
STROKE TEAM PROGRESS NOTE   SUBJECTIVE (INTERVAL HISTORY) Her RN is at the bedside. Pt overnight no acute event, but still complains of dizziness, vertigo and nausea. Headache improved. BP under control with cleviprex and will taper off as able with PO meds.    OBJECTIVE Temp:  [97.5 F (36.4 C)-98.4 F (36.9 C)] 97.9 F (36.6 C) (06/05 0800) Pulse Rate:  [54-87] 77 (06/05 0845) Cardiac Rhythm: Normal sinus rhythm (06/05 0800) Resp:  [13-27] 27 (06/05 0845) BP: (116-149)/(44-83) 139/58 (06/05 0845) SpO2:  [87 %-100 %] 90 % (06/05 0845) Arterial Line BP: (107-176)/(39-61) 156/46 (06/05 0853)  Recent Labs  Lab 04/30/18 0723  GLUCAP 174*   Recent Labs  Lab 04/30/18 0718 04/30/18 0734 04/30/18 1005 04/30/18 1138 05/01/18 0440 05/02/18 0342  NA 137 137 138 135 136 136  K 3.8 3.7 3.9 4.0 4.8 5.1  CL 102 101  --  106 109 112*  CO2 24  --   --  20* 17* 19*  GLUCOSE 181* 178*  --  200* 148* 138*  BUN 16 20  --  16 22* 22*  CREATININE 1.01* 0.80  --  0.74 1.13* 1.00  CALCIUM 9.5  --   --  8.1* 7.7* 7.9*  MG  --   --   --   --   --  2.1  PHOS  --   --   --   --   --  2.7   Recent Labs  Lab 04/30/18 0718  AST 28  ALT 18  ALKPHOS 62  BILITOT 0.4  PROT 7.2  ALBUMIN 4.2   Recent Labs  Lab 04/30/18 0718 04/30/18 0734 04/30/18 1005 04/30/18 1138 05/01/18 0440 05/02/18 0342  WBC 12.5*  --   --  11.0* 17.9* 17.5*  NEUTROABS 9.7*  --   --   --   --   --   HGB 14.2 15.3* 11.6* 12.8 11.2* 11.2*  HCT 42.8 45.0 34.0* 38.6 33.7* 33.5*  MCV 94.7  --   --  93.2 95.7 94.9  PLT 326  --   --  300 288 276   No results for input(s): CKTOTAL, CKMB, CKMBINDEX, TROPONINI in the last 168 hours. Recent Labs    04/30/18 0718  LABPROT 11.6  INR 0.86   Recent Labs    04/30/18 0718  COLORURINE STRAW*  LABSPEC 1.006  PHURINE 7.0  GLUCOSEU NEGATIVE  HGBUR NEGATIVE  BILIRUBINUR NEGATIVE  KETONESUR NEGATIVE  PROTEINUR 100*  NITRITE NEGATIVE  LEUKOCYTESUR NEGATIVE        Component Value Date/Time   CHOL 183 05/02/2018 0342   TRIG 186 (H) 05/02/2018 0342   HDL 48 05/02/2018 0342   CHOLHDL 3.8 05/02/2018 0342   VLDL 37 05/02/2018 0342   LDLCALC 98 05/02/2018 0342   Lab Results  Component Value Date   HGBA1C 5.8 (H) 05/02/2018   No results found for: LABOPIA, COCAINSCRNUR, LABBENZ, AMPHETMU, THCU, LABBARB  No results for input(s): ETH in the last 168 hours.  I have personally reviewed the radiological images below and agree with the radiology interpretations.  Ct Head Wo Contrast  Result Date: 05/01/2018 CLINICAL DATA:  84 year old female with intracranial bleed post surgery with new headache and neck pain. Subsequent encounter. EXAM: CT HEAD WITHOUT CONTRAST TECHNIQUE: Contiguous axial images were obtained from the base of the skull through the vertex without intravenous contrast. COMPARISON:  04/30/2018 CT. FINDINGS: Brain: Post right occipital craniectomy for evacuation of right cerebellar hematoma. Residual blood collection in the right  cerebellum now measures 0.6 x 1.5 x 2.1 cm versus prior 4 x 3.9 x 2.6 cm. Small amount of gas within the resection cavity. Mild surrounding vasogenic edema. Decrease mass effect upon the fourth ventricle. Scattered small areas of pneumocephalus bilaterally greatest frontal region without associated mass effect. No new intracranial hemorrhage or CT evidence of large acute infarct. Remote small left thalamic infarct. Chronic microvascular changes. Vascular: Vascular calcifications Skull: Postsurgical changes occipital region. Sinuses/Orbits: No acute orbital abnormality. Visualized paranasal sinuses are clear. Other: Mastoid air cells and middle ear cavities are clear. IMPRESSION: Post right occipital craniectomy for evacuation of right cerebellar hematoma. Residual blood collection in the right cerebellum now measures 0.6 x 1.5 x 2.1 cm versus prior 4 x 3.9 x 2.6 cm. Small amount of gas within the resection cavity. Mild  surrounding vasogenic edema. Decrease mass effect upon the fourth ventricle. Scattered small areas of pneumocephalus bilaterally greatest frontal region without associated mass effect. No new intracranial hemorrhage or CT evidence of large acute infarct. Electronically Signed   By: Lacy Duverney M.D.   On: 05/01/2018 10:28   Ct Head Wo Contrast  Result Date: 04/30/2018 CLINICAL DATA:  84 year old female with altered mental status. Initial encounter. EXAM: CT HEAD WITHOUT CONTRAST TECHNIQUE: Contiguous axial images were obtained from the base of the skull through the vertex without intravenous contrast. COMPARISON:  None. FINDINGS: Brain: 4 x 3.9 x 2.6 cm right cerebellar hemorrhage with surrounding vasogenic edema and local mass effect. Compression of the fourth ventricle. Patient at risk for development of hydrocephalus. Mild superior displacement of the vermis. Minimal inferior displacement right cerebellar tonsil. No other intracranial mass lesion seen on present unenhanced head CT. Remote left thalamic infarct. Moderate chronic microvascular changes. Mild atrophy. Vascular: Vascular calcifications. Skull: No skull fracture. Sinuses/Orbits: No acute orbital abnormality. Visualized paranasal sinuses are clear. Other: Mastoid air cells and middle ear cavities are clear. IMPRESSION: 4 x 3.9 x 2.6 cm right cerebellar hemorrhage with surrounding vasogenic edema and local mass effect. Compression of the fourth ventricle. Patient at risk for development of hydrocephalus. Mild superior displacement of the vermis. Minimal inferior displacement right cerebellar tonsil. Cerebellar hemorrhage may be related to hypertensive bleed. Underlying lesion not excluded. Remote left thalamic infarct. Moderate chronic microvascular changes. These results were called by telephone at the time of interpretation on 04/30/2018 at 7:30 am to Dr. Ilda Basset who verbally acknowledged these results. Electronically Signed   By: Lacy Duverney M.D.    On: 04/30/2018 07:42   Dg Chest Portable 1 View  Result Date: 04/30/2018 CLINICAL DATA:  Hypoxia EXAM: PORTABLE CHEST 1 VIEW COMPARISON:  None. FINDINGS: Endotracheal tube tip is 1.2 cm above the carina. Nasogastric tube tip and side port are below the diaphragm. No pneumothorax. No edema or consolidation. Heart size and pulmonary vascularity are normal. No adenopathy. No bone lesions. IMPRESSION: Tube positions as described without pneumothorax. Note that the endotracheal tube is near the carina. It may be prudent to consider withdrawing endotracheal tube 2-3 cm. No edema or consolidation. Heart size normal. There is aortic atherosclerosis. Aortic Atherosclerosis (ICD10-I70.0). Electronically Signed   By: Bretta Bang III M.D.   On: 04/30/2018 08:17   TTE - Left ventricle: The cavity size was normal. There was moderate   focal basal hypertrophy. Systolic function was normal. The   estimated ejection fraction was in the range of 60% to 65%. Wall   motion was normal; there were no regional wall motion   abnormalities. Features are consistent with  a pseudonormal left   ventricular filling pattern, with concomitant abnormal relaxation   and increased filling pressure (grade 2 diastolic dysfunction).   Doppler parameters are consistent with high ventricular filling   pressure. - Aortic valve: Trileaflet; normal thickness, mildly calcified   leaflets. - Mitral valve: Calcified annulus. There was trivial regurgitation. - Left atrium: The atrium was severely dilated. - Right ventricle: The cavity size was mildly dilated. Wall   thickness was normal. - Atrial septum: There was increased thickness of the septum,   consistent with lipomatous hypertrophy. - Tricuspid valve: There was trivial regurgitation. - Pulmonary arteries: Systolic pressure could not be accurately   estimated.   PHYSICAL EXAM  Temp:  [97.5 F (36.4 C)-98.4 F (36.9 C)] 97.9 F (36.6 C) (06/05 0800) Pulse Rate:   [54-87] 77 (06/05 0845) Resp:  [13-27] 27 (06/05 0845) BP: (116-149)/(44-83) 139/58 (06/05 0845) SpO2:  [87 %-100 %] 90 % (06/05 0845) Arterial Line BP: (107-176)/(39-61) 156/46 (06/05 0853)  General - Well nourished, well developed, lethargic.  Ophthalmologic - fundi not visualized due to noncooperation.  Cardiovascular - Regular rate and rhythm  Mental Status -  Level of arousal and orientation to time, place, and person were intact. Language including expression, naming, repetition, comprehension was assessed and found intact. However, mild to moderate dysarthria  Cranial Nerves II - XII - II - Visual field intact OU. III, IV, VI - Extraocular movements intact. V - Facial sensation intact bilaterally. VII - Facial movement intact bilaterally. VIII - Hearing & vestibular intact bilaterally, no significant nystagmus. X - Palate elevates symmetrically, mild to moderate dysarthria. XI - Chin turning & shoulder shrug intact bilaterally. XII - Tongue protrusion intact.  Motor Strength - The patient's strength was symmetrical in all extremities and pronator drift was absent.  Bulk was normal and fasciculations were absent.   Motor Tone - Muscle tone was assessed at the neck and appendages and was normal.  Reflexes - The patient's reflexes were symmetrical in all extremities and she had no pathological reflexes.  Sensory - Light touch, temperature/pinprick were assessed and were symmetrical.    Coordination - The patient had right FTN ataxia, but LEs not cooperative today.  Tremor was absent.  Gait and Station - deferred.   ASSESSMENT/PLAN Tiffany Thornton is a 84 y.o. female with no significant history admitted for vertigo, N/V. No tPA given due to ICH.    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  Resultant right ataxia, vertigo, anusea and HA  CT head right cerebellar large ICH  CT head repeat - s/p evacuation and mass effect  much improved.   Consider MRI with and without contrast and MRA once stable to rule out hemorrhagic infarct, brain tumor or AVM   2D Echo EF 60-65%  LDL 98  HgbA1c 5.8  SCDs and subq heparin for VTE prophylaxis   On diet  aspirin 81 mg daily prior to admission, now on No antithrombotic due to ICH  Ongoing aggressive stroke risk factor management  Therapy recommendations:  pending  Disposition:  Pending  Cerebellar edema  CT repeat 05/01/18 - s/p evacuation with improvement of mass effect  CT repeat 05/03/18 pending   Na 136  Will add salt tablet 2g tid  Hypertension Stable on cleviprex    BP goal < 160  Wean off if able  PO BP meds added with amlodipine and lisinopril  Hyperlipidemia   No statins PTA  LDL 98, goal <  70  Consider statin on discharge  Other Stroke Risk Factors  Advanced age  Other Active Problems  Elevated cre 1.13-> 1.00  Leukocytosis WBC 17.9-> 17.5 (afebrile)  Hospital day # 2  This patient is critically ill due to large cerebellar infarct, s/p evacuation, hypertensive emergency and at significant risk of neurological worsening, death form rebleed, ischemic stroke, seizure, cerebral edema, brain herniation. This patient's care requires constant monitoring of vital signs, hemodynamics, respiratory and cardiac monitoring, review of multiple databases, neurological assessment, discussion with family, other specialists and medical decision making of high complexity. I spent 40 minutes of neurocritical care time in the care of this patient. I discussed with Dr. Molli Knock in the hallway.    Marvel Plan, MD PhD Stroke Neurology 05/02/2018 9:55 AM    To contact Stroke Continuity provider, please refer to [email or web address]. After hours, contact General Neurology

## 2018-05-02 NOTE — Progress Notes (Signed)
PULMONARY / CRITICAL CARE MEDICINE   Name: RUTHELLA KIRCHMAN MRN: [id number] DOB: 1933/03/13    ADMISSION DATE:  04/30/2018 CONSULTATION DATE:  6/3  REFERRING MD:  Pool (nsgy)   CHIEF COMPLAINT:  ICH   HISTORY OF PRESENT ILLNESS:   84yo female with minimal PMH presented 6/3 with AMS, headache, n/v.  No trauma. CT head revealed acute R cerebellar hypertensive hemorrhage with significant brainstem compression.  She was taken for emergent suboccipital craniectomy and evacuation of hemorrhage.  She remained intubated post op and PCCM consulted for vent management.   SUBJECTIVE:  No events overnight, tolerated extubation well and able to swallow  VITAL SIGNS: BP (!) 130/49   Pulse 69   Temp 97.9 F (36.6 C) (Oral)   Resp 19   Ht 5' 8.11" (1.73 m)   Wt 135 lb (61.2 kg)   SpO2 94%   BMI 20.46 kg/m   HEMODYNAMICS:    VENTILATOR SETTINGS:    INTAKE / OUTPUT: I/O last 3 completed shifts: In: 4575.8 [I.V.:4425.8; IV Piggyback:150] Out: 875 [Urine:875]  PHYSICAL EXAMINATION: General:  Thin elderly female, NAD Neuro:  Alert and interactive, moving all ext to command HEENT:  [place]/AT, PERRL, EOM-I and MMM Cardiovascular:  RRR, Nl S1/S2 and -M/R/G Lungs:  CTA bilaterally  Abdomen:  Soft, NT, ND and +BS Musculoskeletal:  -edema and -tenderness  LABS:  BMET Recent Labs  Lab 04/30/18 1138 05/01/18 0440 05/02/18 0342  NA 135 136 136  K 4.0 4.8 5.1  CL 106 109 112*  CO2 20* 17* 19*  BUN 16 22* 22*  CREATININE 0.74 1.13* 1.00  GLUCOSE 200* 148* 138*   Electrolytes Recent Labs  Lab 04/30/18 1138 05/01/18 0440 05/02/18 0342  CALCIUM 8.1* 7.7* 7.9*  MG  --   --  2.1  PHOS  --   --  2.7   CBC Recent Labs  Lab 04/30/18 1138 05/01/18 0440 05/02/18 0342  WBC 11.0* 17.9* 17.5*  HGB 12.8 11.2* 11.2*  HCT 38.6 33.7* 33.5*  PLT 300 288 276   Coag's Recent Labs  Lab 04/30/18 0718  INR 0.86   Sepsis Markers Recent Labs  Lab 04/30/18 0735  LATICACIDVEN 2.01*    ABG Recent Labs  Lab 04/30/18 1005 04/30/18 1246  PHART 7.337* 7.343*  PCO2ART 40.5 38.4  PO2ART 269.0* 208.0*   Liver Enzymes Recent Labs  Lab 04/30/18 0718  AST 28  ALT 18  ALKPHOS 62  BILITOT 0.4  ALBUMIN 4.2   Cardiac Enzymes No results for input(s): TROPONINI, PROBNP in the last 168 hours.  Glucose Recent Labs  Lab 04/30/18 0723  GLUCAP 174*   Imaging I reviewed CXR myself, ETT is in good position  STUDIES:  CT head 6/3>>> 4 x 3.9 x 2.6 cm right cerebellar hemorrhage with surrounding vasogenic edema and local mass effect. Compression of the fourth ventricle. Patient at risk for development of hydrocephalus. Mild superior displacement of the vermis. Minimal inferior displacement right cerebellar tonsil.  Cerebellar hemorrhage may be related to hypertensive bleed. Underlying lesion not excluded.  Remote left thalamic infarct. Moderate chronic microvascular changes.  CULTURES: None  ANTIBIOTICS: None  SIGNIFICANT EVENTS: 6/3>> suboccipital craniectomy for acute ICH  LINES/TUBES: ETT 6/3>>>6/4  DISCUSSION: 83yo female with acute ICH with mass effect s/p craniectomy 6/3. VDRF post op.   ASSESSMENT / PLAN:  NEUROLOGIC Large cerebellar ICH - ?? R/t HTN. S/p craniectomy 6/3 P:   Zofran for N/V PRN fentanyl for pain Post op care per nsgy  Neuro  following, appreciate input BP control as below  PULMONARY VDRF in setting AMS r/t large ICH  P:   Titrate O2 for sat of 88-92% If fails then will reintubate but no long term trach/peg CXR and ABG to PRN at this point Flutter valve IS per RT protocol OOB Ambulate  CARDIOVASCULAR HTN urgency- unclear if cause of or response to large ICH  P:  PRN labetalol  Cleveprex gtt - goal SBP <160 Add norvasc 5 mg PO daily, HR 76 Further management per primary  RENAL Hyperkalemia  Hyperchloremia P:   F/u chem in am  Replace electrolytes as indicated D/C IVF, has K in it BMET in  AM  GASTROINTESTINAL No active issue  P:   Diet as ordered PPI   HEMATOLOGIC ICH  P:  Hold home ASA, defer to neurosurgery at this point SCD's   INFECTIOUS No active issue  P:   Peri-op ancef  F/u wbc, fever curve   ENDOCRINE Hx hypothyroid    P:   Restart home dose of synthroid PO  FAMILY  - Updates: Patient and family updated bedside  PCCM will sign off, please call back if needed  Alyson ReedyWesam G. Yacoub, M.D. [facility]. Pager: [phone]. After hours pager: [phone].

## 2018-05-02 NOTE — Progress Notes (Signed)
Postop day 2.  Patient complains of some headache.  Also having some dizziness.  The patient is afebrile.  Her blood pressure is controlled.  Urine output is good.  She is awake and alert.  She is oriented and appropriate.  Cranial nerve function intact.  Motor examination 5/5 bilaterally.  Wound clean and dry.  Patient progressing well following suboccipital craniectomy for cerebellar hematoma.  Continue efforts at mobilization.  Neurology to make determination as to appropriate blood pressure control medications.

## 2018-05-02 NOTE — Progress Notes (Signed)
Evaluation complete, full note to follow.  Recommend CIR post acute. 05/02/2018  Malcolm BingKen Pauleen Goleman, PT [phone]  (pager)

## 2018-05-03 ENCOUNTER — Inpatient Hospital Stay (HOSPITAL_COMMUNITY): Payer: Medicare Other

## 2018-05-03 LAB — BASIC METABOLIC PANEL
ANION GAP: 4 — AB (ref 5–15)
BUN: 15 mg/dL (ref 6–20)
CALCIUM: 8.2 mg/dL — AB (ref 8.9–10.3)
CO2: 25 mmol/L (ref 22–32)
Chloride: 110 mmol/L (ref 101–111)
Creatinine, Ser: 0.84 mg/dL (ref 0.44–1.00)
Glucose, Bld: 120 mg/dL — ABNORMAL HIGH (ref 65–99)
POTASSIUM: 4.2 mmol/L (ref 3.5–5.1)
Sodium: 139 mmol/L (ref 135–145)

## 2018-05-03 LAB — CBC
HEMATOCRIT: 32.4 % — AB (ref 36.0–46.0)
Hemoglobin: 10.7 g/dL — ABNORMAL LOW (ref 12.0–15.0)
MCH: 31.3 pg (ref 26.0–34.0)
MCHC: 33 g/dL (ref 30.0–36.0)
MCV: 94.7 fL (ref 78.0–100.0)
PLATELETS: 274 10*3/uL (ref 150–400)
RBC: 3.42 MIL/uL — ABNORMAL LOW (ref 3.87–5.11)
RDW: 13.9 % (ref 11.5–15.5)
WBC: 12.2 10*3/uL — AB (ref 4.0–10.5)

## 2018-05-03 LAB — GLUCOSE, CAPILLARY
GLUCOSE-CAPILLARY: 107 mg/dL — AB (ref 65–99)
GLUCOSE-CAPILLARY: 113 mg/dL — AB (ref 65–99)
Glucose-Capillary: 114 mg/dL — ABNORMAL HIGH (ref 65–99)
Glucose-Capillary: 113 mg/dL — ABNORMAL HIGH (ref 65–99)

## 2018-05-03 LAB — MAGNESIUM: MAGNESIUM: 2 mg/dL (ref 1.7–2.4)

## 2018-05-03 LAB — PHOSPHORUS: PHOSPHORUS: 2 mg/dL — AB (ref 2.5–4.6)

## 2018-05-03 MED ORDER — LISINOPRIL 20 MG PO TABS
20.0000 mg | ORAL_TABLET | Freq: Two times a day (BID) | ORAL | Status: DC
  Administered 2018-05-03 – 2018-05-07 (×9): 20 mg via ORAL
  Filled 2018-05-03 (×9): qty 1

## 2018-05-03 MED ORDER — MECLIZINE HCL 12.5 MG PO TABS
12.5000 mg | ORAL_TABLET | Freq: Three times a day (TID) | ORAL | Status: DC | PRN
  Administered 2018-05-03 – 2018-05-07 (×7): 12.5 mg via ORAL
  Filled 2018-05-03 (×7): qty 1

## 2018-05-03 MED ORDER — AMLODIPINE BESYLATE 10 MG PO TABS
10.0000 mg | ORAL_TABLET | Freq: Every day | ORAL | Status: DC
  Administered 2018-05-03 – 2018-05-07 (×5): 10 mg via ORAL
  Filled 2018-05-03 (×5): qty 1

## 2018-05-03 MED ORDER — HYDRALAZINE HCL 20 MG/ML IJ SOLN
5.0000 mg | INTRAMUSCULAR | Status: DC | PRN
  Administered 2018-05-03 – 2018-05-07 (×9): 10 mg via INTRAVENOUS
  Administered 2018-05-07: 5 mg via INTRAVENOUS
  Filled 2018-05-03 (×11): qty 1

## 2018-05-03 MED ORDER — HYDRALAZINE HCL 25 MG PO TABS
25.0000 mg | ORAL_TABLET | Freq: Three times a day (TID) | ORAL | Status: DC
Start: 2018-05-03 — End: 2018-05-04
  Administered 2018-05-03 – 2018-05-04 (×3): 25 mg via ORAL
  Filled 2018-05-03 (×3): qty 1

## 2018-05-03 NOTE — Consult Note (Signed)
Physical Medicine and Rehabilitation Consult Reason for Consult: Decreased functional mobility Referring Physician: Dr. Jordan Likes   HPI: Tiffany Thornton is a 84 y.o. right-handed female with history of hypothyroidism.  Presented 04/30/2018 with decreased level of consciousness with associated headache as well as nausea vomiting.  Per chart review patient lives alone.  Independent and active.  She mows 2 acres of grass with a push mower.  She is a former Charity fundraiser at [employer].  One level home with one-step to entry.  Family plans to arrange assistance as needed.  Cranial CT scan showed a 4 x 3.9 x 2.6 cm right cerebellar hemorrhage with surrounding vasogenic edema and local mass-effect.  Compression of the fourth ventricle as well as remote left thalamic infarction.  Cerebellar hemorrhage felt to be related to hypertensive crisis.  Underwent emergent suboccipital craniectomy with evacuation of hematoma 04/30/2018 per Dr. Jordan Likes.  Maintained on Cleviprex for blood pressure control.  Latest cranial CT scan with no new abnormalities.  Subcutaneous heparin later added for DVT prophylaxis.  Tolerating a regular diet.  Physical therapy evaluation completed with recommendations of physical medicine rehab consult.   Review of Systems  Constitutional: Negative for chills and fever.  HENT: Negative for hearing loss.   Eyes: Negative for blurred vision and double vision.  Respiratory: Negative for cough.   Cardiovascular: Negative for chest pain, palpitations and leg swelling.  Gastrointestinal: Positive for nausea and vomiting.  Genitourinary: Negative for dysuria and hematuria.  Musculoskeletal: Positive for myalgias.  Skin: Negative for rash.  Neurological: Positive for headaches. Negative for seizures.  All other systems reviewed and are negative.  History reviewed. No pertinent past medical history.  The histories are not reviewed yet. Please review them in the "History" navigator section and refresh  this SmartLink. No family history on file. Social History:  has no tobacco, alcohol, and drug history on file. Allergies: No Known Allergies Medications Prior to Admission  Medication Sig Dispense Refill  . aspirin EC 81 MG tablet Take 81 mg by mouth daily.    Marland Kitchen levothyroxine (SYNTHROID, LEVOTHROID) 100 MCG tablet Take 100 mcg by mouth daily before breakfast.    . Multiple Vitamin (MULTIVITAMIN WITH MINERALS) TABS tablet Take 1 tablet by mouth daily.      Home: Home Living Family/patient expects to be discharged to:: Private residence Living Arrangements: Alone Available Help at Discharge: Family, Available 24 hours/day Type of Home: House Home Access: Stairs to enter Secretary/administrator of Steps: 1 Entrance Stairs-Rails: None Home Layout: One level Bathroom Shower/Tub: Health visitor: Standard Home Equipment: None  Functional History: Prior Function Level of Independence: Independent Comments: 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, former Charity fundraiser at [employer] Functional Status:  Mobility: Bed Mobility Overal bed mobility: Needs Assistance Bed Mobility: Supine to Sit, Sit to Supine Supine to sit: Mod assist(vc for sequencing, assist for scooting hips EOB) Sit to supine: Mod assist General bed mobility comments: Pt constantly complaining of dizziness throughout session Transfers Overall transfer level: Needs assistance Equipment used: 1 person hand held assist Transfers: Sit to/from Stand, Stand Pivot Transfers Sit to Stand: +2 safety/equipment, Mod assist General transfer comment: used face to face assist.  pt's knee were guarded due to patient unable to coordinate stepping/pivoting to the chair.      ADL: ADL Overall ADL's : Needs assistance/impaired Eating/Feeding: Set up, Bed level Eating/Feeding Details (indicate cue type and reason): Pt able to bring hand to mouth Grooming: Moderate assistance,  Sitting Grooming Details (indicate cue type and reason):  supported sitting Upper Body Bathing: Moderate assistance Lower Body Bathing: Moderate assistance, Sitting/lateral leans Lower Body Bathing Details (indicate cue type and reason): Pt able to bring LLE cross to knees with mod A for sitting balance Upper Body Dressing : Moderate assistance Lower Body Dressing: Minimal assistance, +2 for safety/equipment, +2 for physical assistance, Sit to/from stand Toilet Transfer: Minimal assistance, +2 for physical assistance, +2 for safety/equipment, RW(sit to stand only this session) Toilet Transfer Details (indicate cue type and reason): strong posterior lean with face to face, much safer and better balance with RW - vc for safe hand placement Toileting- Clothing Manipulation and Hygiene: Maximal assistance, Sit to/from stand General ADL Comments: limited by dizziness/nausea throughout assessment; trouble keeping eyes open at this time throughout session despite cues  Cognition: Cognition Overall Cognitive Status: Impaired/Different from baseline Orientation Level: Oriented X4 Cognition Arousal/Alertness: Awake/alert(but trouble keeping eyes open) Behavior During Therapy: Anxious, Flat affect Overall Cognitive Status: Impaired/Different from baseline Area of Impairment: Problem solving, Safety/judgement, Following commands Following Commands: Follows one step commands with increased time, Follows one step commands consistently Safety/Judgement: Decreased awareness of deficits Problem Solving: Decreased initiation, Requires verbal cues  Blood pressure (!) 140/54, pulse (!) 52, temperature 97.8 F (36.6 C), temperature source Oral, resp. rate 15, height 5' 8.11" (1.73 m), weight 61.2 kg (135 lb), SpO2 91 %. Physical Exam  Vitals reviewed. Constitutional: She is oriented to person, place, and time. She appears well-developed. She appears distressed.  HENT:  Head: Normocephalic.  Eyes: No scleral icterus.  Neck: Neck supple.  Cardiovascular:   Tachy   Respiratory: Effort normal.  GI: Soft.  Neurological: She is alert and oriented to person, place, and time.  Had difficult time keeping eyes open due to precipitating nausea and vertigo. Moves all 4 limbs while in bed. Senses pain and LT in all 4  Skin: Skin is warm.  Cranial incision intact  Psychiatric: She has a normal mood and affect. Her behavior is normal.    Results for orders placed or performed during the hospital encounter of 04/30/18 (from the past 24 hour(s))  Glucose, capillary     Status: Abnormal   Collection Time: 05/02/18 11:43 AM  Result Value Ref Range   Glucose-Capillary 123 (H) 65 - 99 mg/dL   Comment 1 Notify RN    Comment 2 Document in Chart   Glucose, capillary     Status: Abnormal   Collection Time: 05/02/18  5:02 PM  Result Value Ref Range   Glucose-Capillary 117 (H) 65 - 99 mg/dL   Comment 1 Notify RN    Comment 2 Document in Chart   Glucose, capillary     Status: Abnormal   Collection Time: 05/02/18  9:46 PM  Result Value Ref Range   Glucose-Capillary 117 (H) 65 - 99 mg/dL  CBC     Status: Abnormal   Collection Time: 05/03/18  4:55 AM  Result Value Ref Range   WBC 12.2 (H) 4.0 - 10.5 K/uL   RBC 3.42 (L) 3.87 - 5.11 MIL/uL   Hemoglobin 10.7 (L) 12.0 - 15.0 g/dL   HCT 16.132.4 (L) 09.636.0 - 04.546.0 %   MCV 94.7 78.0 - 100.0 fL   MCH 31.3 26.0 - 34.0 pg   MCHC 33.0 30.0 - 36.0 g/dL   RDW 40.913.9 81.111.5 - 91.415.5 %   Platelets 274 150 - 400 K/uL   Ct Head Wo Contrast  Result Date: 05/03/2018 CLINICAL DATA:  Intracranial  hemorrhage follow up EXAM: CT HEAD WITHOUT CONTRAST TECHNIQUE: Contiguous axial images were obtained from the base of the skull through the vertex without intravenous contrast. COMPARISON:  Head CT 05/01/2018, 04/30/2018 FINDINGS: Brain: Status post surgical evacuation of right posterior fossa hematoma. The amount of pneumocephalus has decreased. The fourth ventricle and basal cisterns are patent. No new hemorrhage. No hydrocephalus. The area  of hypoattenuation in the right cerebellum has slightly increased in size. There is white matter hypoattenuation of the supratentorial brain consistent with chronic small vessel disease. Old right basal ganglia lacunar infarct is unchanged. Vascular: No abnormal hyperdensity of the major intracranial arteries or dural venous sinuses. No intracranial atherosclerosis. Skull: Status post suboccipital craniectomy. Sinuses/Orbits: No fluid levels or advanced mucosal thickening of the visualized paranasal sinuses. No mastoid or middle ear effusion. The orbits are normal. IMPRESSION: Decreased pneumocephalus and low products in the right posterior fossa status post surgical evacuation of hematoma. No new abnormality. Electronically Signed   By: Deatra Robinson M.D.   On: 05/03/2018 04:47   Ct Head Wo Contrast  Result Date: 05/01/2018 CLINICAL DATA:  84 year old female with intracranial bleed post surgery with new headache and neck pain. Subsequent encounter. EXAM: CT HEAD WITHOUT CONTRAST TECHNIQUE: Contiguous axial images were obtained from the base of the skull through the vertex without intravenous contrast. COMPARISON:  04/30/2018 CT. FINDINGS: Brain: Post right occipital craniectomy for evacuation of right cerebellar hematoma. Residual blood collection in the right cerebellum now measures 0.6 x 1.5 x 2.1 cm versus prior 4 x 3.9 x 2.6 cm. Small amount of gas within the resection cavity. Mild surrounding vasogenic edema. Decrease mass effect upon the fourth ventricle. Scattered small areas of pneumocephalus bilaterally greatest frontal region without associated mass effect. No new intracranial hemorrhage or CT evidence of large acute infarct. Remote small left thalamic infarct. Chronic microvascular changes. Vascular: Vascular calcifications Skull: Postsurgical changes occipital region. Sinuses/Orbits: No acute orbital abnormality. Visualized paranasal sinuses are clear. Other: Mastoid air cells and middle ear cavities  are clear. IMPRESSION: Post right occipital craniectomy for evacuation of right cerebellar hematoma. Residual blood collection in the right cerebellum now measures 0.6 x 1.5 x 2.1 cm versus prior 4 x 3.9 x 2.6 cm. Small amount of gas within the resection cavity. Mild surrounding vasogenic edema. Decrease mass effect upon the fourth ventricle. Scattered small areas of pneumocephalus bilaterally greatest frontal region without associated mass effect. No new intracranial hemorrhage or CT evidence of large acute infarct. Electronically Signed   By: Lacy Duverney M.D.   On: 05/01/2018 10:28     Assessment/Plan: Diagnosis: right cerebellar hemorrhage s/p suboccipital craniectomy 1. Does the need for close, 24 hr/day medical supervision in concert with the patient's rehab needs make it unreasonable for this patient to be served in a less intensive setting? Yes 2. Co-Morbidities requiring supervision/potential complications: htn, vestibular symptom mgt 3. Due to bladder management, bowel management, safety, skin/wound care, disease management, medication administration, pain management and patient education, does the patient require 24 hr/day rehab nursing? Yes 4. Does the patient require coordinated care of a physician, rehab nurse, PT (1-2 hrs/day, 5 days/week), OT (1-2 hrs/day, 5 days/week) and SLP (potentially 1-2 hrs/day, potentially 5 days/week) to address physical and functional deficits in the context of the above medical diagnosis(es)? Yes Addressing deficits in the following areas: balance, endurance, locomotion, strength, transferring, bowel/bladder control, bathing, dressing, feeding, grooming, toileting, speech, swallowing and psychosocial support 5. Can the patient actively participate in an intensive therapy program of at  least 3 hrs of therapy per day at least 5 days per week? Yes 6. The potential for patient to make measurable gains while on inpatient rehab is excellent 7. Anticipated  functional outcomes upon discharge from inpatient rehab are supervision  with PT, supervision with OT, modified independent with SLP. 8. Estimated rehab length of stay to reach the above functional goals is: potentially 12-18 days 9. Anticipated D/C setting: Home 10. Anticipated post D/C treatments: HH therapy 11. Overall Rehab/Functional Prognosis: excellent and good  RECOMMENDATIONS: This patient's condition is appropriate for continued rehabilitative care in the following setting: CIR Patient has agreed to participate in recommended program. Yes Note that insurance prior authorization may be required for reimbursement for recommended care.  Comment: Rehab Admissions Coordinator to follow up.  Thanks,  Tiffany Oyster, MD, [place] [facility]  I have personally performed a face to face diagnostic evaluation of this patient. Additionally, I have reviewed and concur with the physician assistant's documentation above.     Mcarthur Rossetti Angiulli, PA-C 05/03/2018

## 2018-05-03 NOTE — Progress Notes (Signed)
Patient complains of incisional pain and dizziness.  She is afebrile.  Blood pressure and heart rate are normal.  Urine output good.  Patient awake and aware.  She is oriented and appropriate.  Speech is fluent.  Cranial nerve function stable.  Motor and sensory function intact.  Wound clean and dry.  Chest and abdomen benign.  Overall patient doing well.  Dizziness and nausea should gradually clear as irritation around the hemorrhage/operative bed diminishes.  Continue current rehab efforts.

## 2018-05-03 NOTE — Progress Notes (Signed)
Inpatient Rehabilitation Admissions Coordinator  I met with patient briefly at bedside and then contacted her son, [name]/POA, by phone. We discussed goals and expectations of an inpt rehab admit. Children prefer CIR but patient had spoke about possible [facility]. [name] will discuss with his Mom tonight. I clarified with Dr. Erlinda Hong when pt would be felt to be medically ready for d/c. Plans are for early next week. I await further progress with therapy before I proceed with [facility] Medicare approval initiation.   Danne Baxter, RN, MSN Rehab Admissions Coordinator [phone] 05/03/2018 2:01 PM

## 2018-05-03 NOTE — Progress Notes (Signed)
Physical Therapy Treatment Patient Details Name: Tiffany Thornton MRN: [id number] DOB: Jul 23, 1933 Today's Date: 05/03/2018    History of Present Illness [name] is a 84 y.o. female with no significant history admitted for vertigo, N/V. S/p suboccipital craniectomy for cerebellar hematoma.    PT Comments    Encourage pt to participate.  Worked on habituation and compensation for her vertigo and equilibrium problems.  Emphasis on transitions to sit EOB, balance at EOB, transitions to stand and assisted ambulation using visual compensations.    Follow Up Recommendations  CIR;Supervision/Assistance - 24 hour     Equipment Recommendations  Other (comment)(TBA)    Recommendations for Other Services Rehab consult     Precautions / Restrictions Precautions Precautions: Fall    Mobility  Bed Mobility Overal bed mobility: Needs Assistance Bed Mobility: Supine to Sit;Sit to Supine     Supine to sit: Mod assist Sit to supine: Mod assist   General bed mobility comments: cued for best technique to decrease vertigo symptoms.  Transfers Overall transfer level: Needs assistance Equipment used: 2 person hand held assist Transfers: Sit to/from Stand Sit to Stand: Min assist;+2 physical assistance         General transfer comment: cues for direction,  Assist to keep pt forward and stabilize while she came to upright posture.  Ambulation/Gait Ambulation/Gait assistance: Min assist;+2 physical assistance Ambulation Distance (Feet): 10 Feet(forward and back, then 12 feet forw/back.) Assistive device: 2 person hand held assist Gait Pattern/deviations: Step-through pattern   Gait velocity interpretation: <1.31 ft/sec, indicative of household ambulator General Gait Details: unsteady gait, needing stability and assist to bring pt forward into midline.   Stairs             Wheelchair Mobility    Modified Rankin (Stroke Patients Only) Modified Rankin (Stroke Patients  Only) Pre-Morbid Rankin Score: No symptoms Modified Rankin: Moderately severe disability     Balance Overall balance assessment: Needs assistance Sitting-balance support: Bilateral upper extremity supported;Feet supported Sitting balance-Leahy Scale: Poor Sitting balance - Comments: requiring external balance support   Standing balance support: Bilateral upper extremity supported Standing balance-Leahy Scale: Poor Standing balance comment: dependent on external support from staff                             Cognition Arousal/Alertness: Awake/alert Behavior During Therapy: Flat affect;Anxious Overall Cognitive Status: (not tested formally)                         Following Commands: Follows one step commands with increased time              Exercises      General Comments        Pertinent Vitals/Pain Pain Assessment: Faces Faces Pain Scale: Hurts whole lot Pain Location: Headache Pain Descriptors / Indicators: Headache Pain Intervention(s): Monitored during session    Home Living                      Prior Function            PT Goals (current goals can now be found in the care plan section) Acute Rehab PT Goals Patient Stated Goal: to get independent again PT Goal Formulation: With patient Time For Goal Achievement: 05/16/18 Potential to Achieve Goals: Good Progress towards PT goals: Progressing toward goals    Frequency    Min 3X/week  PT Plan Current plan remains appropriate    Co-evaluation              AM-PAC PT "6 Clicks" Daily Activity  Outcome Measure  Difficulty turning over in bed (including adjusting bedclothes, sheets and blankets)?: Unable Difficulty moving from lying on back to sitting on the side of the bed? : Unable Difficulty sitting down on and standing up from a chair with arms (e.g., wheelchair, bedside commode, etc,.)?: Unable Help needed moving to and from a bed to chair (including  a wheelchair)?: A Lot Help needed walking in hospital room?: A Lot Help needed climbing 3-5 steps with a railing? : A Lot 6 Click Score: 9    End of Session   Activity Tolerance: Other (comment)(limited by nausea and vertigo.) Patient left: in bed;with call bell/phone within reach;with family/visitor present Nurse Communication: Mobility status PT Visit Diagnosis: Unsteadiness on feet (R26.81);Other abnormalities of gait and mobility (R26.89);Other symptoms and signs involving the nervous system (R29.898)     Time: 1610-9604 PT Time Calculation (min) (ACUTE ONLY): 23 min  Charges:  $Gait Training: 8-22 mins $Therapeutic Activity: 8-22 mins                    G Codes:       05/06/2018   Bing, PT [phone]  (pager)   Tiffany Thornton 2018-05-06, 6:23 PM

## 2018-05-03 NOTE — Progress Notes (Signed)
STROKE TEAM PROGRESS NOTE   SUBJECTIVE (INTERVAL HISTORY) Her granddaughter is at the bedside. Pt overnight no acute event, BP better controlled although need labetalol PRN but now off cleviprex. Will increase po meds. Not eating well due to dizziness and nausea, continue IVF. Vital stable, repeat CT head no hydrocephalus.    OBJECTIVE Temp:  [97.5 F (36.4 C)-98.1 F (36.7 C)] 97.5 F (36.4 C) (06/06 0757) Pulse Rate:  [52-89] 60 (06/06 0700) Cardiac Rhythm: Normal sinus rhythm (06/05 2000) Resp:  [13-25] 17 (06/06 0700) BP: (125-176)/(46-73) 147/49 (06/06 0700) SpO2:  [88 %-98 %] 96 % (06/06 0700)  Recent Labs  Lab 04/30/18 0723 05/02/18 1143 05/02/18 1702 05/02/18 2146 05/03/18 0721  GLUCAP 174* 123* 117* 117* 107*   Recent Labs  Lab 04/30/18 0718 04/30/18 0734 04/30/18 1005 04/30/18 1138 05/01/18 0440 05/02/18 0342 05/03/18 0455  NA 137 137 138 135 136 136 139  K 3.8 3.7 3.9 4.0 4.8 5.1 4.2  CL 102 101  --  106 109 112* 110  CO2 24  --   --  20* 17* 19* 25  GLUCOSE 181* 178*  --  200* 148* 138* 120*  BUN 16 20  --  16 22* 22* 15  CREATININE 1.01* 0.80  --  0.74 1.13* 1.00 0.84  CALCIUM 9.5  --   --  8.1* 7.7* 7.9* 8.2*  MG  --   --   --   --   --  2.1 2.0  PHOS  --   --   --   --   --  2.7 2.0*   Recent Labs  Lab 04/30/18 0718  AST 28  ALT 18  ALKPHOS 62  BILITOT 0.4  PROT 7.2  ALBUMIN 4.2   Recent Labs  Lab 04/30/18 0718  04/30/18 1005 04/30/18 1138 05/01/18 0440 05/02/18 0342 05/03/18 0455  WBC 12.5*  --   --  11.0* 17.9* 17.5* 12.2*  NEUTROABS 9.7*  --   --   --   --   --   --   HGB 14.2   < > 11.6* 12.8 11.2* 11.2* 10.7*  HCT 42.8   < > 34.0* 38.6 33.7* 33.5* 32.4*  MCV 94.7  --   --  93.2 95.7 94.9 94.7  PLT 326  --   --  300 288 276 274   < > = values in this interval not displayed.   No results for input(s): CKTOTAL, CKMB, CKMBINDEX, TROPONINI in the last 168 hours. No results for input(s): LABPROT, INR in the last 72 hours. No  results for input(s): COLORURINE, LABSPEC, PHURINE, GLUCOSEU, HGBUR, BILIRUBINUR, KETONESUR, PROTEINUR, UROBILINOGEN, NITRITE, LEUKOCYTESUR in the last 72 hours.  Invalid input(s): APPERANCEUR     Component Value Date/Time   CHOL 183 05/02/2018 0342   TRIG 186 (H) 05/02/2018 0342   HDL 48 05/02/2018 0342   CHOLHDL 3.8 05/02/2018 0342   VLDL 37 05/02/2018 0342   LDLCALC 98 05/02/2018 0342   Lab Results  Component Value Date   HGBA1C 5.8 (H) 05/02/2018   No results found for: LABOPIA, COCAINSCRNUR, LABBENZ, AMPHETMU, THCU, LABBARB  No results for input(s): ETH in the last 168 hours.  I have personally reviewed the radiological images below and agree with the radiology interpretations.  Ct Head Wo Contrast  Result Date: 05/03/2018 CLINICAL DATA:  Intracranial hemorrhage follow up EXAM: CT HEAD WITHOUT CONTRAST TECHNIQUE: Contiguous axial images were obtained from the base of the skull through the vertex without intravenous contrast. COMPARISON:  Head CT 05/01/2018, 04/30/2018 FINDINGS: Brain: Status post surgical evacuation of right posterior fossa hematoma. The amount of pneumocephalus has decreased. The fourth ventricle and basal cisterns are patent. No new hemorrhage. No hydrocephalus. The area of hypoattenuation in the right cerebellum has slightly increased in size. There is white matter hypoattenuation of the supratentorial brain consistent with chronic small vessel disease. Old right basal ganglia lacunar infarct is unchanged. Vascular: No abnormal hyperdensity of the major intracranial arteries or dural venous sinuses. No intracranial atherosclerosis. Skull: Status post suboccipital craniectomy. Sinuses/Orbits: No fluid levels or advanced mucosal thickening of the visualized paranasal sinuses. No mastoid or middle ear effusion. The orbits are normal. IMPRESSION: Decreased pneumocephalus and low products in the right posterior fossa status post surgical evacuation of hematoma. No new  abnormality. Electronically Signed   By: Deatra Robinson M.D.   On: 05/03/2018 04:47   Ct Head Wo Contrast  Result Date: 05/01/2018 CLINICAL DATA:  84 year old female with intracranial bleed post surgery with new headache and neck pain. Subsequent encounter. EXAM: CT HEAD WITHOUT CONTRAST TECHNIQUE: Contiguous axial images were obtained from the base of the skull through the vertex without intravenous contrast. COMPARISON:  04/30/2018 CT. FINDINGS: Brain: Post right occipital craniectomy for evacuation of right cerebellar hematoma. Residual blood collection in the right cerebellum now measures 0.6 x 1.5 x 2.1 cm versus prior 4 x 3.9 x 2.6 cm. Small amount of gas within the resection cavity. Mild surrounding vasogenic edema. Decrease mass effect upon the fourth ventricle. Scattered small areas of pneumocephalus bilaterally greatest frontal region without associated mass effect. No new intracranial hemorrhage or CT evidence of large acute infarct. Remote small left thalamic infarct. Chronic microvascular changes. Vascular: Vascular calcifications Skull: Postsurgical changes occipital region. Sinuses/Orbits: No acute orbital abnormality. Visualized paranasal sinuses are clear. Other: Mastoid air cells and middle ear cavities are clear. IMPRESSION: Post right occipital craniectomy for evacuation of right cerebellar hematoma. Residual blood collection in the right cerebellum now measures 0.6 x 1.5 x 2.1 cm versus prior 4 x 3.9 x 2.6 cm. Small amount of gas within the resection cavity. Mild surrounding vasogenic edema. Decrease mass effect upon the fourth ventricle. Scattered small areas of pneumocephalus bilaterally greatest frontal region without associated mass effect. No new intracranial hemorrhage or CT evidence of large acute infarct. Electronically Signed   By: Lacy Duverney M.D.   On: 05/01/2018 10:28   Ct Head Wo Contrast  Result Date: 04/30/2018 CLINICAL DATA:  84 year old female with altered mental status.  Initial encounter. EXAM: CT HEAD WITHOUT CONTRAST TECHNIQUE: Contiguous axial images were obtained from the base of the skull through the vertex without intravenous contrast. COMPARISON:  None. FINDINGS: Brain: 4 x 3.9 x 2.6 cm right cerebellar hemorrhage with surrounding vasogenic edema and local mass effect. Compression of the fourth ventricle. Patient at risk for development of hydrocephalus. Mild superior displacement of the vermis. Minimal inferior displacement right cerebellar tonsil. No other intracranial mass lesion seen on present unenhanced head CT. Remote left thalamic infarct. Moderate chronic microvascular changes. Mild atrophy. Vascular: Vascular calcifications. Skull: No skull fracture. Sinuses/Orbits: No acute orbital abnormality. Visualized paranasal sinuses are clear. Other: Mastoid air cells and middle ear cavities are clear. IMPRESSION: 4 x 3.9 x 2.6 cm right cerebellar hemorrhage with surrounding vasogenic edema and local mass effect. Compression of the fourth ventricle. Patient at risk for development of hydrocephalus. Mild superior displacement of the vermis. Minimal inferior displacement right cerebellar tonsil. Cerebellar hemorrhage may be related to hypertensive bleed. Underlying lesion  not excluded. Remote left thalamic infarct. Moderate chronic microvascular changes. These results were called by telephone at the time of interpretation on 04/30/2018 at 7:30 am to Dr. Ilda Bassetardona who verbally acknowledged these results. Electronically Signed   By: Lacy DuverneySteven  Olson M.D.   On: 04/30/2018 07:42   Dg Chest Portable 1 View  Result Date: 04/30/2018 CLINICAL DATA:  Hypoxia EXAM: PORTABLE CHEST 1 VIEW COMPARISON:  None. FINDINGS: Endotracheal tube tip is 1.2 cm above the carina. Nasogastric tube tip and side port are below the diaphragm. No pneumothorax. No edema or consolidation. Heart size and pulmonary vascularity are normal. No adenopathy. No bone lesions. IMPRESSION: Tube positions as described  without pneumothorax. Note that the endotracheal tube is near the carina. It may be prudent to consider withdrawing endotracheal tube 2-3 cm. No edema or consolidation. Heart size normal. There is aortic atherosclerosis. Aortic Atherosclerosis (ICD10-I70.0). Electronically Signed   By: Bretta BangWilliam  Woodruff III M.D.   On: 04/30/2018 08:17   TTE - Left ventricle: The cavity size was normal. There was moderate   focal basal hypertrophy. Systolic function was normal. The   estimated ejection fraction was in the range of 60% to 65%. Wall   motion was normal; there were no regional wall motion   abnormalities. Features are consistent with a pseudonormal left   ventricular filling pattern, with concomitant abnormal relaxation   and increased filling pressure (grade 2 diastolic dysfunction).   Doppler parameters are consistent with high ventricular filling   pressure. - Aortic valve: Trileaflet; normal thickness, mildly calcified   leaflets. - Mitral valve: Calcified annulus. There was trivial regurgitation. - Left atrium: The atrium was severely dilated. - Right ventricle: The cavity size was mildly dilated. Wall   thickness was normal. - Atrial septum: There was increased thickness of the septum,   consistent with lipomatous hypertrophy. - Tricuspid valve: There was trivial regurgitation. - Pulmonary arteries: Systolic pressure could not be accurately   estimated.   PHYSICAL EXAM  Temp:  [97.5 F (36.4 C)-98.1 F (36.7 C)] 97.5 F (36.4 C) (06/06 0757) Pulse Rate:  [52-89] 60 (06/06 0700) Resp:  [13-25] 17 (06/06 0700) BP: (125-176)/(46-73) 147/49 (06/06 0700) SpO2:  [88 %-98 %] 96 % (06/06 0700)  General - Well nourished, well developed, not in acute distress  Ophthalmologic - fundi not visualized due to noncooperation.  Cardiovascular - Regular rate and rhythm  Mental Status -  Level of arousal and orientation to time, place, and person were intact. Language including expression,  naming, repetition, comprehension was assessed and found intact. However, mild dysarthria  Cranial Nerves II - XII - II - Visual field intact OU. III, IV, VI - Extraocular movements intact. V - Facial sensation intact bilaterally. VII - Facial movement intact bilaterally. VIII - Hearing & vestibular intact bilaterally, no significant nystagmus. X - Palate elevates symmetrically, mild dysarthria. XI - Chin turning & shoulder shrug intact bilaterally. XII - Tongue protrusion intact.  Motor Strength - The patient's strength was symmetrical in all extremities and pronator drift was absent.  Bulk was normal and fasciculations were absent.   Motor Tone - Muscle tone was assessed at the neck and appendages and was normal.  Reflexes - The patient's reflexes were symmetrical in all extremities and she had no pathological reflexes.  Sensory - Light touch, temperature/pinprick were assessed and were symmetrical.    Coordination - The patient had right FTN and HTS ataxia, LUE and LLE intact coordination.  Tremor was absent.  Gait and Station -  deferred.   ASSESSMENT/PLAN Ms. ZAYDAH NAWABI is a 84 y.o. female with no significant history admitted for vertigo, N/V. No tPA given due to ICH.    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  Resultant right ataxia, vertigo, anusea and HA  CT head right cerebellar large ICH  CT head repeat - s/p evacuation and mass effect much improved.   CT repeat stable right cerebellar edema without hydrocephalus  Consider MRI with and without contrast and MRA once stable to rule out hemorrhagic infarct, brain tumor or AVM   2D Echo EF 60-65%  LDL 98  HgbA1c 5.8  SCDs and subq heparin for VTE prophylaxis   On diet  aspirin 81 mg daily prior to admission, now on No antithrombotic due to ICH  Ongoing aggressive stroke risk factor management  Therapy recommendations:  pending  Disposition:   Pending  Cerebellar edema  CT repeat 05/01/18 - s/p evacuation with improvement of mass effect  CT repeat 05/03/18 stable right cerebellar edema without hydrocephalus  Na 136 -> 139  On salt tablet 2g tid  Hypertension Now off cleviprex   Needed labetalol PRN overnight  BP goal < 160  Increased amlodipine and lisinopril dose  Hyperlipidemia   No statins PTA  LDL 98, goal < 70  Consider statin on discharge  Other Stroke Risk Factors  Advanced age  Other Active Problems  Elevated cre 1.13-> 1.00->0.84  Leukocytosis WBC 17.9-> 17.5->12.2 (afebrile)  Hospital day # 3  This patient is critically ill due to large cerebellar infarct, s/p evacuation, hypertensive emergency and at significant risk of neurological worsening, death form rebleed, ischemic stroke, seizure, cerebral edema, brain herniation. This patient's care requires constant monitoring of vital signs, hemodynamics, respiratory and cardiac monitoring, review of multiple databases, neurological assessment, discussion with family, other specialists and medical decision making of high complexity. I spent 35 minutes of neurocritical care time in the care of this patient.   Marvel Plan, MD PhD Stroke Neurology 05/03/2018 10:06 AM    To contact Stroke Continuity provider, please refer to [email or web address]. After hours, contact General Neurology

## 2018-05-04 ENCOUNTER — Other Ambulatory Visit: Payer: Self-pay

## 2018-05-04 ENCOUNTER — Encounter (HOSPITAL_COMMUNITY): Payer: Self-pay

## 2018-05-04 LAB — GLUCOSE, CAPILLARY
GLUCOSE-CAPILLARY: 124 mg/dL — AB (ref 65–99)
Glucose-Capillary: 135 mg/dL — ABNORMAL HIGH (ref 65–99)
Glucose-Capillary: 122 mg/dL — ABNORMAL HIGH (ref 65–99)
Glucose-Capillary: 116 mg/dL — ABNORMAL HIGH (ref 65–99)

## 2018-05-04 MED ORDER — BISACODYL 10 MG RE SUPP
10.0000 mg | Freq: Every day | RECTAL | Status: DC | PRN
  Filled 2018-05-04: qty 1

## 2018-05-04 MED ORDER — HYDRALAZINE HCL 50 MG PO TABS
50.0000 mg | ORAL_TABLET | Freq: Three times a day (TID) | ORAL | Status: DC
  Administered 2018-05-05 – 2018-05-07 (×8): 50 mg via ORAL
  Filled 2018-05-04 (×8): qty 1

## 2018-05-04 MED ORDER — DOCUSATE SODIUM 100 MG PO CAPS
100.0000 mg | ORAL_CAPSULE | Freq: Two times a day (BID) | ORAL | Status: DC
  Administered 2018-05-04 – 2018-05-07 (×7): 100 mg via ORAL
  Filled 2018-05-04 (×7): qty 1

## 2018-05-04 NOTE — Progress Notes (Signed)
Physical Therapy Treatment Patient Details Name: Tiffany Thornton MRN: [id number] DOB: [birth year] Today's Date: 05/04/2018    History of Present Illness [name] is a 84 y.o. female with no significant history admitted for vertigo, N/V. S/p suboccipital craniectomy for cerebellar hematoma.    PT Comments    Pt showing outward improvement in symptoms.  Brighter eyes when open, subjective reports of diminished nausea and less dysequilibrium.  Mobility is ataxic, but more manageable with one person assist if no assist available.    Follow Up Recommendations  CIR;Supervision/Assistance - 24 hour     Equipment Recommendations  Other (comment)    Recommendations for Other Services       Precautions / Restrictions Precautions Precautions: Fall    Mobility  Bed Mobility Overal bed mobility: Needs Assistance Bed Mobility: Sit to Supine       Sit to supine: Mod assist   General bed mobility comments: cued for best technique to decrease vertigo symptoms, but pt went down so fast that she didn't listen.  Transfers Overall transfer level: Needs assistance   Transfers: Sit to/from Stand Sit to Stand: Min assist         General transfer comment: cues for technique and assist forward and for stability  Ambulation/Gait Ambulation/Gait assistance: Min assist;Mod assist Ambulation Distance (Feet): 15 Feet(to toilet and 20 feet from toilet toward door & finally bed) Assistive device: Rolling walker (2 wheeled) Gait Pattern/deviations: Step-through pattern Gait velocity: slow   General Gait Details: unsteady and mildly ataxic, listing right and needing stability asisst and help with RW   Stairs             Wheelchair Mobility    Modified Rankin (Stroke Patients Only) Modified Rankin (Stroke Patients Only) Pre-Morbid Rankin Score: No symptoms Modified Rankin: Moderately severe disability     Balance Overall balance assessment: Needs  assistance Sitting-balance support: Bilateral upper extremity supported;Feet supported;Single extremity supported Sitting balance-Leahy Scale: Poor Sitting balance - Comments: requiring external balance support   Standing balance support: Bilateral upper extremity supported Standing balance-Leahy Scale: Poor Standing balance comment: dependent on external support from staff and or RW                            Cognition Arousal/Alertness: Awake/alert Behavior During Therapy: Flat affect Overall Cognitive Status: Impaired/Different from baseline                         Following Commands: Follows one step commands consistently              Exercises      General Comments General comments (skin integrity, edema, etc.): Worked on compensation for dizziness and equilibrium.  Sats RA93%, BP 169/89      Pertinent Vitals/Pain Faces Pain Scale: Hurts little more Pain Location: neck Pain Descriptors / Indicators: Sore Pain Intervention(s): Monitored during session    Home Living                      Prior Function            PT Goals (current goals can now be found in the care plan section) Acute Rehab PT Goals Patient Stated Goal: to get independent again PT Goal Formulation: With patient Time For Goal Achievement: 05/16/18 Potential to Achieve Goals: Good Progress towards PT goals: Progressing toward goals    Frequency    Min 3X/week  PT Plan Current plan remains appropriate    Co-evaluation              AM-PAC PT "6 Clicks" Daily Activity  Outcome Measure  Difficulty turning over in bed (including adjusting bedclothes, sheets and blankets)?: Unable Difficulty moving from lying on back to sitting on the side of the bed? : Unable Difficulty sitting down on and standing up from a chair with arms (e.g., wheelchair, bedside commode, etc,.)?: Unable Help needed moving to and from a bed to chair (including a wheelchair)?:  A Lot Help needed walking in hospital room?: A Lot Help needed climbing 3-5 steps with a railing? : A Lot 6 Click Score: 9    End of Session   Activity Tolerance: Patient tolerated treatment well Patient left: in bed;with call bell/phone within reach;with nursing/sitter in room Nurse Communication: Mobility status PT Visit Diagnosis: Unsteadiness on feet (R26.81);Other abnormalities of gait and mobility (R26.89);Other symptoms and signs involving the nervous system (U98.119(R29.898)     Time: [id number] PT Time Calculation (min) (ACUTE ONLY): 20 min  Charges:  $Gait Training: 8-22 mins                    G Codes:       05/04/2018  [place] BingKen Jermarion Poffenberger, PT [phone]  (pager)   Eliseo GumKenneth V Caprisha Bridgett 05/04/2018, 12:51 PM

## 2018-05-04 NOTE — Anesthesia Postprocedure Evaluation (Signed)
Anesthesia Post Note  Patient: Tiffany Thornton  Procedure(s) Performed: CRANIOTOMY HEMATOMA EVACUATION SUBDURAL (N/A )     Patient location during evaluation: SICU Anesthesia Type: General Level of consciousness: sedated Pain management: pain level controlled Vital Signs Assessment: post-procedure vital signs reviewed and stable Respiratory status: patient remains intubated per anesthesia plan Cardiovascular status: stable Postop Assessment: no apparent nausea or vomiting Anesthetic complications: no    Last Vitals:  Vitals:   05/04/18 0900 05/04/18 1049  BP: (!) 147/68 (!) 144/59  Pulse: 83   Resp: 16   Temp:    SpO2: 94%     Last Pain:  Vitals:   05/04/18 0830  TempSrc:   PainSc: Asleep                 Deerica Waszak

## 2018-05-04 NOTE — Progress Notes (Signed)
Inpatient Rehabilitation Admissions Coordinator  I met with patient at bedside to discuss her options for rehab when medically ready for d/c. She has spoken with her son, Chriss Czar, and is in agreement to [facility] pending insurance approval. We discussed cost and insurance coverage. Patient expressed that she does not feel that she has had a stroke. I provided reinforcement that she had and discussed her functional deficits. I updated RN.  Danne Baxter, RN, MSN Rehab Admissions Coordinator [phone] 05/04/2018 12:43 PM

## 2018-05-04 NOTE — Progress Notes (Signed)
STROKE TEAM PROGRESS NOTE   SUBJECTIVE (INTERVAL HISTORY) Her daughter is at the bedside. Pt dizziness improved, and now is able to sit in chair although the dizziness has not gone yet. Able to eat several bites breakfast but no appetite. Will keep IVF. Still on low dose cleviprex, will try to taper off. On amlodipine, lisinopril and hydralazine.     OBJECTIVE Temp:  [97.5 F (36.4 C)-97.7 F (36.5 C)] 97.5 F (36.4 C) (06/07 1200) Pulse Rate:  [57-93] 84 (06/07 1130) Cardiac Rhythm: Normal sinus rhythm (06/07 0800) Resp:  [13-23] 16 (06/07 1130) BP: (123-184)/(43-87) 163/56 (06/07 1320) SpO2:  [90 %-97 %] 94 % (06/07 1130)  Recent Labs  Lab 05/03/18 1142 05/03/18 1726 05/03/18 2136 05/04/18 0822 05/04/18 1156  GLUCAP 114* 113* 113* 135* 124*   Recent Labs  Lab 04/30/18 0718 04/30/18 0734 04/30/18 1005 04/30/18 1138 05/01/18 0440 05/02/18 0342 05/03/18 0455  NA 137 137 138 135 136 136 139  K 3.8 3.7 3.9 4.0 4.8 5.1 4.2  CL 102 101  --  106 109 112* 110  CO2 24  --   --  20* 17* 19* 25  GLUCOSE 181* 178*  --  200* 148* 138* 120*  BUN 16 20  --  16 22* 22* 15  CREATININE 1.01* 0.80  --  0.74 1.13* 1.00 0.84  CALCIUM 9.5  --   --  8.1* 7.7* 7.9* 8.2*  MG  --   --   --   --   --  2.1 2.0  PHOS  --   --   --   --   --  2.7 2.0*   Recent Labs  Lab 04/30/18 0718  AST 28  ALT 18  ALKPHOS 62  BILITOT 0.4  PROT 7.2  ALBUMIN 4.2   Recent Labs  Lab 04/30/18 0718  04/30/18 1005 04/30/18 1138 05/01/18 0440 05/02/18 0342 05/03/18 0455  WBC 12.5*  --   --  11.0* 17.9* 17.5* 12.2*  NEUTROABS 9.7*  --   --   --   --   --   --   HGB 14.2   < > 11.6* 12.8 11.2* 11.2* 10.7*  HCT 42.8   < > 34.0* 38.6 33.7* 33.5* 32.4*  MCV 94.7  --   --  93.2 95.7 94.9 94.7  PLT 326  --   --  300 288 276 274   < > = values in this interval not displayed.   No results for input(s): CKTOTAL, CKMB, CKMBINDEX, TROPONINI in the last 168 hours. No results for input(s): LABPROT, INR in  the last 72 hours. No results for input(s): COLORURINE, LABSPEC, PHURINE, GLUCOSEU, HGBUR, BILIRUBINUR, KETONESUR, PROTEINUR, UROBILINOGEN, NITRITE, LEUKOCYTESUR in the last 72 hours.  Invalid input(s): APPERANCEUR     Component Value Date/Time   CHOL 183 05/02/2018 0342   TRIG 186 (H) 05/02/2018 0342   HDL 48 05/02/2018 0342   CHOLHDL 3.8 05/02/2018 0342   VLDL 37 05/02/2018 0342   LDLCALC 98 05/02/2018 0342   Lab Results  Component Value Date   HGBA1C 5.8 (H) 05/02/2018   No results found for: LABOPIA, COCAINSCRNUR, LABBENZ, AMPHETMU, THCU, LABBARB  No results for input(s): ETH in the last 168 hours.  I have personally reviewed the radiological images below and agree with the radiology interpretations.  Ct Head Wo Contrast  Result Date: 05/03/2018 CLINICAL DATA:  Intracranial hemorrhage follow up EXAM: CT HEAD WITHOUT CONTRAST TECHNIQUE: Contiguous axial images were obtained from the base of  the skull through the vertex without intravenous contrast. COMPARISON:  Head CT 05/01/2018, 04/30/2018 FINDINGS: Brain: Status post surgical evacuation of right posterior fossa hematoma. The amount of pneumocephalus has decreased. The fourth ventricle and basal cisterns are patent. No new hemorrhage. No hydrocephalus. The area of hypoattenuation in the right cerebellum has slightly increased in size. There is white matter hypoattenuation of the supratentorial brain consistent with chronic small vessel disease. Old right basal ganglia lacunar infarct is unchanged. Vascular: No abnormal hyperdensity of the major intracranial arteries or dural venous sinuses. No intracranial atherosclerosis. Skull: Status post suboccipital craniectomy. Sinuses/Orbits: No fluid levels or advanced mucosal thickening of the visualized paranasal sinuses. No mastoid or middle ear effusion. The orbits are normal. IMPRESSION: Decreased pneumocephalus and low products in the right posterior fossa status post surgical evacuation of  hematoma. No new abnormality. Electronically Signed   By: Deatra RobinsonKevin  Herman M.D.   On: 05/03/2018 04:47   Ct Head Wo Contrast  Result Date: 05/01/2018 CLINICAL DATA:  84 year old female with intracranial bleed post surgery with new headache and neck pain. Subsequent encounter. EXAM: CT HEAD WITHOUT CONTRAST TECHNIQUE: Contiguous axial images were obtained from the base of the skull through the vertex without intravenous contrast. COMPARISON:  04/30/2018 CT. FINDINGS: Brain: Post right occipital craniectomy for evacuation of right cerebellar hematoma. Residual blood collection in the right cerebellum now measures 0.6 x 1.5 x 2.1 cm versus prior 4 x 3.9 x 2.6 cm. Small amount of gas within the resection cavity. Mild surrounding vasogenic edema. Decrease mass effect upon the fourth ventricle. Scattered small areas of pneumocephalus bilaterally greatest frontal region without associated mass effect. No new intracranial hemorrhage or CT evidence of large acute infarct. Remote small left thalamic infarct. Chronic microvascular changes. Vascular: Vascular calcifications Skull: Postsurgical changes occipital region. Sinuses/Orbits: No acute orbital abnormality. Visualized paranasal sinuses are clear. Other: Mastoid air cells and middle ear cavities are clear. IMPRESSION: Post right occipital craniectomy for evacuation of right cerebellar hematoma. Residual blood collection in the right cerebellum now measures 0.6 x 1.5 x 2.1 cm versus prior 4 x 3.9 x 2.6 cm. Small amount of gas within the resection cavity. Mild surrounding vasogenic edema. Decrease mass effect upon the fourth ventricle. Scattered small areas of pneumocephalus bilaterally greatest frontal region without associated mass effect. No new intracranial hemorrhage or CT evidence of large acute infarct. Electronically Signed   By: Lacy DuverneySteven  Olson M.D.   On: 05/01/2018 10:28   Ct Head Wo Contrast  Result Date: 04/30/2018 CLINICAL DATA:  84 year old female with  altered mental status. Initial encounter. EXAM: CT HEAD WITHOUT CONTRAST TECHNIQUE: Contiguous axial images were obtained from the base of the skull through the vertex without intravenous contrast. COMPARISON:  None. FINDINGS: Brain: 4 x 3.9 x 2.6 cm right cerebellar hemorrhage with surrounding vasogenic edema and local mass effect. Compression of the fourth ventricle. Patient at risk for development of hydrocephalus. Mild superior displacement of the vermis. Minimal inferior displacement right cerebellar tonsil. No other intracranial mass lesion seen on present unenhanced head CT. Remote left thalamic infarct. Moderate chronic microvascular changes. Mild atrophy. Vascular: Vascular calcifications. Skull: No skull fracture. Sinuses/Orbits: No acute orbital abnormality. Visualized paranasal sinuses are clear. Other: Mastoid air cells and middle ear cavities are clear. IMPRESSION: 4 x 3.9 x 2.6 cm right cerebellar hemorrhage with surrounding vasogenic edema and local mass effect. Compression of the fourth ventricle. Patient at risk for development of hydrocephalus. Mild superior displacement of the vermis. Minimal inferior displacement right cerebellar tonsil.  Cerebellar hemorrhage may be related to hypertensive bleed. Underlying lesion not excluded. Remote left thalamic infarct. Moderate chronic microvascular changes. These results were called by telephone at the time of interpretation on 04/30/2018 at 7:30 am to Dr. Ilda Basset who verbally acknowledged these results. Electronically Signed   By: Lacy Duverney M.D.   On: 04/30/2018 07:42   Dg Chest Portable 1 View  Result Date: 04/30/2018 CLINICAL DATA:  Hypoxia EXAM: PORTABLE CHEST 1 VIEW COMPARISON:  None. FINDINGS: Endotracheal tube tip is 1.2 cm above the carina. Nasogastric tube tip and side port are below the diaphragm. No pneumothorax. No edema or consolidation. Heart size and pulmonary vascularity are normal. No adenopathy. No bone lesions. IMPRESSION: Tube  positions as described without pneumothorax. Note that the endotracheal tube is near the carina. It may be prudent to consider withdrawing endotracheal tube 2-3 cm. No edema or consolidation. Heart size normal. There is aortic atherosclerosis. Aortic Atherosclerosis (ICD10-I70.0). Electronically Signed   By: Bretta Bang III M.D.   On: 04/30/2018 08:17   TTE - Left ventricle: The cavity size was normal. There was moderate   focal basal hypertrophy. Systolic function was normal. The   estimated ejection fraction was in the range of 60% to 65%. Wall   motion was normal; there were no regional wall motion   abnormalities. Features are consistent with a pseudonormal left   ventricular filling pattern, with concomitant abnormal relaxation   and increased filling pressure (grade 2 diastolic dysfunction).   Doppler parameters are consistent with high ventricular filling   pressure. - Aortic valve: Trileaflet; normal thickness, mildly calcified   leaflets. - Mitral valve: Calcified annulus. There was trivial regurgitation. - Left atrium: The atrium was severely dilated. - Right ventricle: The cavity size was mildly dilated. Wall   thickness was normal. - Atrial septum: There was increased thickness of the septum,   consistent with lipomatous hypertrophy. - Tricuspid valve: There was trivial regurgitation. - Pulmonary arteries: Systolic pressure could not be accurately   estimated.   PHYSICAL EXAM  Temp:  [97.5 F (36.4 C)-97.7 F (36.5 C)] 97.5 F (36.4 C) (06/07 1200) Pulse Rate:  [57-93] 84 (06/07 1130) Resp:  [13-23] 16 (06/07 1130) BP: (123-184)/(43-87) 163/56 (06/07 1320) SpO2:  [90 %-97 %] 94 % (06/07 1130)  General - Well nourished, well developed, not in acute distress  Ophthalmologic - fundi not visualized due to noncooperation.  Cardiovascular - Regular rate and rhythm  Mental Status -  Level of arousal and orientation to time, place, and person were  intact. Language including expression, naming, repetition, comprehension was assessed and found intact. Mild dysarthria  Cranial Nerves II - XII - II - Visual field intact OU. III, IV, VI - Extraocular movements intact. V - Facial sensation intact bilaterally. VII - Facial movement intact bilaterally. VIII - Hearing & vestibular intact bilaterally, no significant nystagmus. X - Palate elevates symmetrically, mild dysarthria. XI - Chin turning & shoulder shrug intact bilaterally. XII - Tongue protrusion intact.  Motor Strength - The patient's strength was symmetrical in all extremities and pronator drift was absent.  Bulk was normal and fasciculations were absent.   Motor Tone - Muscle tone was assessed at the neck and appendages and was normal.  Reflexes - The patient's reflexes were symmetrical in all extremities and she had no pathological reflexes.  Sensory - Light touch, temperature/pinprick were assessed and were symmetrical.    Coordination - The patient had right FTN and HTS ataxia, LUE and LLE intact  coordination.  Tremor was absent.  Gait and Station - deferred.  Exam not changed from yesterday  ASSESSMENT/PLAN Ms. CAYTLIN BETTER is a 84 y.o. female with no significant history admitted for vertigo, N/V. No tPA given due to ICH.    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  Resultant right ataxia, vertigo, anusea and HA  CT head right cerebellar large ICH  CT head repeat - s/p evacuation and mass effect much improved.   CT repeat stable right cerebellar edema without hydrocephalus  MRI with and without contrast and MRA in am to rule out hemorrhagic infarct, brain tumor or AVM   2D Echo EF 60-65%  LDL 98  HgbA1c 5.8  SCDs and subq heparin for VTE prophylaxis   On diet  aspirin 81 mg daily prior to admission, now on No antithrombotic due to ICH  Ongoing aggressive stroke risk factor management  Therapy  recommendations:  pending  Disposition:  Pending  Cerebellar edema  CT repeat 05/01/18 - s/p evacuation with improvement of mass effect  CT repeat 05/03/18 stable right cerebellar edema without hydrocephalus  MRI and MRA pending  Na 136 -> 139  D/c salt tablet, clinically stable  Hypertension Still on cleviprex   Needed labetalol PRN overnight  BP goal < 160  On hydralazine IV PRN  Maximized on amlodipine and lisinopril dose  Added hydralazine 50mg  Q8h  Hyperlipidemia   No statins PTA  LDL 98, goal < 70  Consider statin on discharge  Other Stroke Risk Factors  Advanced age  Other Active Problems  Elevated cre 1.13-> 1.00->0.84  Leukocytosis WBC 17.9-> 17.5->12.2 (afebrile)  Hospital day # 4  This patient is critically ill due to large cerebellar infarct, s/p evacuation, hypertensive emergency and at significant risk of neurological worsening, death form rebleed, ischemic stroke, seizure, cerebral edema, brain herniation. This patient's care requires constant monitoring of vital signs, hemodynamics, respiratory and cardiac monitoring, review of multiple databases, neurological assessment, discussion with family, other specialists and medical decision making of high complexity. I spent 35 minutes of neurocritical care time in the care of this patient.   Marvel Plan, MD PhD Stroke Neurology 05/04/2018 2:50 PM    To contact Stroke Continuity provider, please refer to [email or web address]. After hours, contact General Neurology

## 2018-05-04 NOTE — Progress Notes (Signed)
Patient still with complaints of dizziness and some intermittent nausea.  No other neurologic symptoms.  Blood pressure better controlled.  Awake and alert.  Oriented and appropriate.  She does have a little bit of nystagmus.  Extraocular movements are full.  Facial movement normal bilaterally.  Motor and sensory function of the extremities normal.  Overall doing recently well.  Still with some brain stem irritation secondary to her hemorrhage.  Continue supportive measures.  Transfer to stepdown.

## 2018-05-05 ENCOUNTER — Inpatient Hospital Stay (HOSPITAL_COMMUNITY): Payer: Medicare Other

## 2018-05-05 LAB — BASIC METABOLIC PANEL
Anion gap: 7 (ref 5–15)
BUN: 11 mg/dL (ref 6–20)
CHLORIDE: 104 mmol/L (ref 101–111)
CO2: 26 mmol/L (ref 22–32)
CREATININE: 0.82 mg/dL (ref 0.44–1.00)
Calcium: 7.4 mg/dL — ABNORMAL LOW (ref 8.9–10.3)
GFR calc Af Amer: >60 mL/min (ref >60)
GFR calc non Af Amer: >60 mL/min (ref >60)
Glucose, Bld: 99 mg/dL (ref 65–99)
Potassium: 4.1 mmol/L (ref 3.5–5.1)
SODIUM: 137 mmol/L (ref 135–145)

## 2018-05-05 LAB — CBC
HEMATOCRIT: 36.2 % (ref 36.0–46.0)
HEMOGLOBIN: 12.3 g/dL (ref 12.0–15.0)
MCH: 32.3 pg (ref 26.0–34.0)
MCHC: 34 g/dL (ref 30.0–36.0)
MCV: 95 fL (ref 78.0–100.0)
Platelets: 346 10*3/uL (ref 150–400)
RBC: 3.81 MIL/uL — ABNORMAL LOW (ref 3.87–5.11)
RDW: 13.7 % (ref 11.5–15.5)
WBC: 8.8 10*3/uL (ref 4.0–10.5)

## 2018-05-05 LAB — GLUCOSE, CAPILLARY
GLUCOSE-CAPILLARY: 108 mg/dL — AB (ref 65–99)
GLUCOSE-CAPILLARY: 115 mg/dL — AB (ref 65–99)
GLUCOSE-CAPILLARY: 100 mg/dL — AB (ref 65–99)
Glucose-Capillary: 111 mg/dL — ABNORMAL HIGH (ref 65–99)

## 2018-05-05 MED ORDER — SCOPOLAMINE 1 MG/3DAYS TD PT72
1.0000 | MEDICATED_PATCH | TRANSDERMAL | Status: DC
  Administered 2018-05-05 (×2): 1.5 mg via TRANSDERMAL
  Filled 2018-05-05 (×2): qty 1

## 2018-05-05 MED ORDER — LABETALOL HCL 100 MG PO TABS: 100.0000 mg | ORAL_TABLET | Freq: Two times a day (BID) | ORAL | Status: DC

## 2018-05-05 MED ORDER — GADOBENATE DIMEGLUMINE 529 MG/ML IV SOLN
11.0000 mL | Freq: Once | INTRAVENOUS | Status: AC
  Administered 2018-05-05: 11 mL via INTRAVENOUS

## 2018-05-05 NOTE — Progress Notes (Signed)
Pt's BP increased during the night. 0043: BP of 183/71, hydralazine administered and decreased to 152/66 1hr later. 0457: BP of 177/66, hydralazine again admin and BP decreased. 0701: 148/56. Pt did not require nausea or pain meds throughout the night after transfer to floor. [facility]

## 2018-05-05 NOTE — Progress Notes (Signed)
Neurosurgery Progress Note  No issues overnight. Complains of dizziness this am Denies focal deficits  EXAM:  BP (!) 179/84 (BP Location: Right Arm)   Pulse 87   Temp 97.7 F (36.5 C) (Oral)   Resp 18   Ht 5' 8.11" (1.73 m)   Wt 64.6 kg (142 lb 6.7 oz)   SpO2 99%   BMI 21.58 kg/m   Awake, alert, oriented  CN grossly intact MAEW with good strength  PLAN Stable this am Continue current care

## 2018-05-05 NOTE — Progress Notes (Signed)
1900: Handoff report received from RN. Pt returning from MRI during report; son alerted staff that "patch" was removed during scan. Discussed plan of care for the shift; pt amenable to plan. Scopolomine patch replaced.  0000: Pt resting comfortably.  0400: Pt continues resting comfortably.  0700: Handoff report given to RN. No acute events overnight.

## 2018-05-05 NOTE — Progress Notes (Signed)
STROKE TEAM PROGRESS NOTE   SUBJECTIVE (INTERVAL HISTORY) No family at bedside.  Pt complains of significant vertigo, especially with head movement.  No nausea or vomiting today.    OBJECTIVE Temp:  [97.5 F (36.4 C)-98.3 F (36.8 C)] 97.7 F (36.5 C) (06/08 0800) Pulse Rate:  [63-96] 87 (06/08 0800) Cardiac Rhythm: Normal sinus rhythm (06/08 0400) Resp:  [14-21] 18 (06/08 0800) BP: (148-189)/(49-132) 179/84 (06/08 0800) SpO2:  [91 %-99 %] 99 % (06/08 0800) Weight:  [142 lb 6.7 oz (64.6 kg)] 142 lb 6.7 oz (64.6 kg) (06/07 2204)  Recent Labs  Lab 05/04/18 0822 05/04/18 1156 05/04/18 1705 05/04/18 1954 05/05/18 0809  GLUCAP 135* 124* 122* 116* 108*   Recent Labs  Lab 04/30/18 1138 05/01/18 0440 05/02/18 0342 05/03/18 0455 05/05/18 0452  NA 135 136 136 139 137  K 4.0 4.8 5.1 4.2 4.1  CL 106 109 112* 110 104  CO2 20* 17* 19* 25 26  GLUCOSE 200* 148* 138* 120* 99  BUN 16 22* 22* 15 11  CREATININE 0.74 1.13* 1.00 0.84 0.82  CALCIUM 8.1* 7.7* 7.9* 8.2* 7.4*  MG  --   --  2.1 2.0  --   PHOS  --   --  2.7 2.0*  --    Recent Labs  Lab 04/30/18 0718  AST 28  ALT 18  ALKPHOS 62  BILITOT 0.4  PROT 7.2  ALBUMIN 4.2   Recent Labs  Lab 04/30/18 0718  04/30/18 1138 05/01/18 0440 05/02/18 0342 05/03/18 0455 05/05/18 0452  WBC 12.5*  --  11.0* 17.9* 17.5* 12.2* 8.8  NEUTROABS 9.7*  --   --   --   --   --   --   HGB 14.2   < > 12.8 11.2* 11.2* 10.7* 12.3  HCT 42.8   < > 38.6 33.7* 33.5* 32.4* 36.2  MCV 94.7  --  93.2 95.7 94.9 94.7 95.0  PLT 326  --  300 288 276 274 346   < > = values in this interval not displayed.   No results for input(s): CKTOTAL, CKMB, CKMBINDEX, TROPONINI in the last 168 hours. No results for input(s): LABPROT, INR in the last 72 hours. No results for input(s): COLORURINE, LABSPEC, PHURINE, GLUCOSEU, HGBUR, BILIRUBINUR, KETONESUR, PROTEINUR, UROBILINOGEN, NITRITE, LEUKOCYTESUR in the last 72 hours.  Invalid input(s): APPERANCEUR      Component Value Date/Time   CHOL 183 05/02/2018 0342   TRIG 186 (H) 05/02/2018 0342   HDL 48 05/02/2018 0342   CHOLHDL 3.8 05/02/2018 0342   VLDL 37 05/02/2018 0342   LDLCALC 98 05/02/2018 0342   Lab Results  Component Value Date   HGBA1C 5.8 (H) 05/02/2018   No results found for: LABOPIA, COCAINSCRNUR, LABBENZ, AMPHETMU, THCU, LABBARB  No results for input(s): ETH in the last 168 hours.  I have personally reviewed the radiological images below and agree with the radiology interpretations.  Ct Head Wo Contrast  Result Date: 05/03/2018 CLINICAL DATA:  Intracranial hemorrhage follow up EXAM: CT HEAD WITHOUT CONTRAST TECHNIQUE: Contiguous axial images were obtained from the base of the skull through the vertex without intravenous contrast. COMPARISON:  Head CT 05/01/2018, 04/30/2018 FINDINGS: Brain: Status post surgical evacuation of right posterior fossa hematoma. The amount of pneumocephalus has decreased. The fourth ventricle and basal cisterns are patent. No new hemorrhage. No hydrocephalus. The area of hypoattenuation in the right cerebellum has slightly increased in size. There is white matter hypoattenuation of the supratentorial brain consistent with chronic small  vessel disease. Old right basal ganglia lacunar infarct is unchanged. Vascular: No abnormal hyperdensity of the major intracranial arteries or dural venous sinuses. No intracranial atherosclerosis. Skull: Status post suboccipital craniectomy. Sinuses/Orbits: No fluid levels or advanced mucosal thickening of the visualized paranasal sinuses. No mastoid or middle ear effusion. The orbits are normal. IMPRESSION: Decreased pneumocephalus and low products in the right posterior fossa status post surgical evacuation of hematoma. No new abnormality. Electronically Signed   By: Deatra Robinson M.D.   On: 05/03/2018 04:47   Ct Head Wo Contrast  Result Date: 05/01/2018 CLINICAL DATA:  84 year old female with intracranial bleed post surgery  with new headache and neck pain. Subsequent encounter. EXAM: CT HEAD WITHOUT CONTRAST TECHNIQUE: Contiguous axial images were obtained from the base of the skull through the vertex without intravenous contrast. COMPARISON:  04/30/2018 CT. FINDINGS: Brain: Post right occipital craniectomy for evacuation of right cerebellar hematoma. Residual blood collection in the right cerebellum now measures 0.6 x 1.5 x 2.1 cm versus prior 4 x 3.9 x 2.6 cm. Small amount of gas within the resection cavity. Mild surrounding vasogenic edema. Decrease mass effect upon the fourth ventricle. Scattered small areas of pneumocephalus bilaterally greatest frontal region without associated mass effect. No new intracranial hemorrhage or CT evidence of large acute infarct. Remote small left thalamic infarct. Chronic microvascular changes. Vascular: Vascular calcifications Skull: Postsurgical changes occipital region. Sinuses/Orbits: No acute orbital abnormality. Visualized paranasal sinuses are clear. Other: Mastoid air cells and middle ear cavities are clear. IMPRESSION: Post right occipital craniectomy for evacuation of right cerebellar hematoma. Residual blood collection in the right cerebellum now measures 0.6 x 1.5 x 2.1 cm versus prior 4 x 3.9 x 2.6 cm. Small amount of gas within the resection cavity. Mild surrounding vasogenic edema. Decrease mass effect upon the fourth ventricle. Scattered small areas of pneumocephalus bilaterally greatest frontal region without associated mass effect. No new intracranial hemorrhage or CT evidence of large acute infarct. Electronically Signed   By: Lacy Duverney M.D.   On: 05/01/2018 10:28   Ct Head Wo Contrast  Result Date: 04/30/2018 CLINICAL DATA:  84 year old female with altered mental status. Initial encounter. EXAM: CT HEAD WITHOUT CONTRAST TECHNIQUE: Contiguous axial images were obtained from the base of the skull through the vertex without intravenous contrast. COMPARISON:  None. FINDINGS:  Brain: 4 x 3.9 x 2.6 cm right cerebellar hemorrhage with surrounding vasogenic edema and local mass effect. Compression of the fourth ventricle. Patient at risk for development of hydrocephalus. Mild superior displacement of the vermis. Minimal inferior displacement right cerebellar tonsil. No other intracranial mass lesion seen on present unenhanced head CT. Remote left thalamic infarct. Moderate chronic microvascular changes. Mild atrophy. Vascular: Vascular calcifications. Skull: No skull fracture. Sinuses/Orbits: No acute orbital abnormality. Visualized paranasal sinuses are clear. Other: Mastoid air cells and middle ear cavities are clear. IMPRESSION: 4 x 3.9 x 2.6 cm right cerebellar hemorrhage with surrounding vasogenic edema and local mass effect. Compression of the fourth ventricle. Patient at risk for development of hydrocephalus. Mild superior displacement of the vermis. Minimal inferior displacement right cerebellar tonsil. Cerebellar hemorrhage may be related to hypertensive bleed. Underlying lesion not excluded. Remote left thalamic infarct. Moderate chronic microvascular changes. These results were called by telephone at the time of interpretation on 04/30/2018 at 7:30 am to Dr. Ilda Basset who verbally acknowledged these results. Electronically Signed   By: Lacy Duverney M.D.   On: 04/30/2018 07:42   Dg Chest Portable 1 View  Result Date: 04/30/2018 CLINICAL  DATA:  Hypoxia EXAM: PORTABLE CHEST 1 VIEW COMPARISON:  None. FINDINGS: Endotracheal tube tip is 1.2 cm above the carina. Nasogastric tube tip and side port are below the diaphragm. No pneumothorax. No edema or consolidation. Heart size and pulmonary vascularity are normal. No adenopathy. No bone lesions. IMPRESSION: Tube positions as described without pneumothorax. Note that the endotracheal tube is near the carina. It may be prudent to consider withdrawing endotracheal tube 2-3 cm. No edema or consolidation. Heart size normal. There is aortic  atherosclerosis. Aortic Atherosclerosis (ICD10-I70.0). Electronically Signed   By: Bretta Bang III M.D.   On: 04/30/2018 08:17   TTE - Left ventricle: The cavity size was normal. There was moderate   focal basal hypertrophy. Systolic function was normal. The   estimated ejection fraction was in the range of 60% to 65%. Wall   motion was normal; there were no regional wall motion   abnormalities. Features are consistent with a pseudonormal left   ventricular filling pattern, with concomitant abnormal relaxation   and increased filling pressure (grade 2 diastolic dysfunction).   Doppler parameters are consistent with high ventricular filling   pressure. - Aortic valve: Trileaflet; normal thickness, mildly calcified   leaflets. - Mitral valve: Calcified annulus. There was trivial regurgitation. - Left atrium: The atrium was severely dilated. - Right ventricle: The cavity size was mildly dilated. Wall   thickness was normal. - Atrial septum: There was increased thickness of the septum,   consistent with lipomatous hypertrophy. - Tricuspid valve: There was trivial regurgitation. - Pulmonary arteries: Systolic pressure could not be accurately   estimated.   PHYSICAL EXAM  Temp:  [97.5 F (36.4 C)-98.3 F (36.8 C)] 97.7 F (36.5 C) (06/08 0800) Pulse Rate:  [63-96] 87 (06/08 0800) Resp:  [14-21] 18 (06/08 0800) BP: (148-189)/(49-132) 179/84 (06/08 0800) SpO2:  [91 %-99 %] 99 % (06/08 0800) Weight:  [142 lb 6.7 oz (64.6 kg)] 142 lb 6.7 oz (64.6 kg) (06/07 2204)  General - Well nourished, well developed, not in acute distress  Ophthalmologic - fundi not visualized due to noncooperation.  Cardiovascular - Regular rate and rhythm  Mental Status -  Level of arousal and orientation to time, place, and person were intact. Language including expression, naming, repetition, comprehension was assessed and found intact. Cranial Nerves II - XII - II - Visual field intact OU. III,  IV, VI - Extraocular movements intact. V - Facial sensation intact bilaterally. VII - Facial movement intact bilaterally. VIII - Hearing & vestibular intact bilaterally, no significant nystagmus. X - Palate elevates symmetrically. XI - Chin turning & shoulder shrug intact bilaterally. XII - Tongue protrusion intact.  Motor Strength - The patient's strength was symmetrical in all extremities and pronator drift was absent.  Bulk was normal and fasciculations were absent.   Motor Tone - Muscle tone was assessed at the neck and appendages and was normal.  Reflexes - The patient's reflexes were symmetrical in all extremities and she had no pathological reflexes.  Sensory - Light touch, temperature/pinprick were assessed and were symmetrical.    Coordination - The patient had right FTN and HTS ataxia, LUE and LLE intact coordination.  Tremor was absent.  Gait and Station - deferred.  ASSESSMENT/PLAN [name] is a 84 y.o. female with no significant history admitted for vertigo, N/V. No tPA given due to ICH.    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  Colleagues have ordered MRI to see if underlying ischemic stroke  with hemorrhagic transformation, but I doubt that based on initial CT scan.  She is s/p suboccipital craniectomy and is doing well neurologically.  Her SBP is still consistently >170 and goal is <160.  Hydralazine has recently been added to maximized Norvasc and Lisinopril.  She is off Cleviprex.  May consider changing Hydralazine to Labetalol if not effective. She is very uncomfortable with the vertigo.  Scopolamine patch is going to be added.    Weston Settle, MS, MD    ICH:  right cerebellar large ICH s/p suboccipital decompression and hematoma evacuation.  Resultant right ataxia, vertigo, anusea and HA  CT head right cerebellar large ICH  CT head repeat - s/p evacuation and mass effect much improved.   CT repeat stable  right cerebellar edema without hydrocephalus  2D Echo EF 60-65%  LDL 98  HgbA1c 5.8  SCDs and subq heparin for VTE prophylaxis   On diet  aspirin 81 mg daily prior to admission, now on No antithrombotic due to ICH  Ongoing aggressive stroke risk factor management  Therapy recommendations:  pending  Disposition:  Pending  Cerebellar edema  CT repeat 05/01/18 - s/p evacuation with improvement of mass effect  CT repeat 05/03/18 stable right cerebellar edema without hydrocephalus  MRI and MRA pending  Na 136 -> 139  D/c salt tablet, clinically stable  Hypertension Still on cleviprex   Needed labetalol PRN overnight  BP goal < 160  On hydralazine IV PRN  Maximized on amlodipine and lisinopril dose  Added hydralazine 50mg  Q8h  Hyperlipidemia   No statins PTA  LDL 98, goal < 70  Consider statin on discharge  Other Stroke Risk Factors  Advanced age  Other Active Problems  Elevated cre 1.13-> 1.00->0.84  Leukocytosis WBC 17.9-> 17.5->12.2 (afebrile)  Hospital day # 5  This patient is critically ill due to large cerebellar infarct, s/p evacuation, hypertensive emergency and at significant risk of neurological worsening, death form rebleed, ischemic stroke, seizure, cerebral edema, brain herniation. This patient's care requires constant monitoring of vital signs, hemodynamics, respiratory and cardiac monitoring, review of multiple databases, neurological assessment, discussion with family, other specialists and medical decision making of high complexity. I spent 35 minutes of neurocritical care time in the care of this patient.      To contact Stroke Continuity provider, please refer to [email or web address]. After hours, contact General Neurology

## 2018-05-06 LAB — CBC
HEMATOCRIT: 38.7 % (ref 36.0–46.0)
Hemoglobin: 12.8 g/dL (ref 12.0–15.0)
MCH: 31.2 pg (ref 26.0–34.0)
MCHC: 33.1 g/dL (ref 30.0–36.0)
MCV: 94.4 fL (ref 78.0–100.0)
PLATELETS: 349 10*3/uL (ref 150–400)
RBC: 4.1 MIL/uL (ref 3.87–5.11)
RDW: 13.5 % (ref 11.5–15.5)
WBC: 8.3 10*3/uL (ref 4.0–10.5)

## 2018-05-06 LAB — BASIC METABOLIC PANEL
ANION GAP: 8 (ref 5–15)
BUN: 10 mg/dL (ref 6–20)
CALCIUM: 8.7 mg/dL — AB (ref 8.9–10.3)
CO2: 29 mmol/L (ref 22–32)
CREATININE: 0.75 mg/dL (ref 0.44–1.00)
Chloride: 102 mmol/L (ref 101–111)
GFR calc Af Amer: >60 mL/min (ref >60)
GLUCOSE: 93 mg/dL (ref 65–99)
Potassium: 3.6 mmol/L (ref 3.5–5.1)
Sodium: 139 mmol/L (ref 135–145)

## 2018-05-06 LAB — GLUCOSE, CAPILLARY
GLUCOSE-CAPILLARY: 135 mg/dL — AB (ref 65–99)
GLUCOSE-CAPILLARY: 115 mg/dL — AB (ref 65–99)
Glucose-Capillary: 167 mg/dL — ABNORMAL HIGH (ref 65–99)
Glucose-Capillary: 121 mg/dL — ABNORMAL HIGH (ref 65–99)

## 2018-05-06 MED ORDER — SCOPOLAMINE 1 MG/3DAYS TD PT72
1.0000 | MEDICATED_PATCH | TRANSDERMAL | Status: DC
  Administered 2018-05-06: 1.5 mg via TRANSDERMAL
  Filled 2018-05-06: qty 1

## 2018-05-06 MED ORDER — CHLORHEXIDINE GLUCONATE CLOTH 2 % EX PADS
6.0000 | MEDICATED_PAD | Freq: Every day | CUTANEOUS | Status: DC
  Administered 2018-05-06 – 2018-05-07 (×2): 6 via TOPICAL

## 2018-05-06 NOTE — Progress Notes (Signed)
Neurosurgery Progress Note  No issues overnight. Continues to have dizziness No focal deficits Feels stronger each day  EXAM:  BP (!) 152/81   Pulse 91   Temp (!) 97.1 F (36.2 C)   Resp 19   Ht 5' 8.11" (1.73 m)   Wt 64.6 kg (142 lb 6.7 oz)   SpO2 96%   BMI 21.58 kg/m   Awake, alert, oriented  Speech fluent, appropriate  MAEW with good strength CN grossly intact Incision c/d/i  PLAN Stable this am Will continue current care

## 2018-05-06 NOTE — Progress Notes (Signed)
1900: Handoff report received from RN. Pt resting in bed. Discussed plan of care for the shift; pt amenable to plan.  0000: Pt c/o dizziness.  0400: Pt continues to be restless and c/o not feeling well. While assisting pt to reposition, pt states, "I wish they would just let me die," but would not elaborate on this statement when prompted.  0500: Pt son at bedside.  0700: Handoff report given to RN. No acute events overnight.

## 2018-05-06 NOTE — Progress Notes (Addendum)
STROKE TEAM PROGRESS NOTE   SUBJECTIVE (INTERVAL HISTORY) Son is at bedside.  Pt complains of significant vertigo still, especially with head movement.  No nausea or vomiting today. Scopalamine patch was placed on arm instead of behind the ear.  Pharmacy is investigating whether that is essential for its mechanism of action.  MRI Brain shows area of ischemic infarct lateral to the right cerebellar hemorrhage.  MRA Brain shows small cerebral aneurysms in bilateral Posterior communicating arteries and anterior communicating artery, all <64mm.     OBJECTIVE Temp:  [97.6 F (36.4 C)-98.4 F (36.9 C)] 98.4 F (36.9 C) (06/09 0020) Pulse Rate:  [74-94] 91 (06/09 0439) Cardiac Rhythm: Normal sinus rhythm (06/09 0400) Resp:  [18-21] 19 (06/09 0439) BP: (152-197)/(53-81) 152/81 (06/09 0439) SpO2:  [94 %-98 %] 96 % (06/09 0439)  Recent Labs  Lab 05/04/18 1954 05/05/18 0809 05/05/18 1255 05/05/18 1654 05/05/18 2131  GLUCAP 116* 108* 115* 111* 100*   Recent Labs  Lab 05/01/18 0440 05/02/18 0342 05/03/18 0455 05/05/18 0452 05/06/18 0600  NA 136 136 139 137 139  K 4.8 5.1 4.2 4.1 3.6  CL 109 112* 110 104 102  CO2 17* 19* 25 26 29   GLUCOSE 148* 138* 120* 99 93  BUN 22* 22* 15 11 10   CREATININE 1.13* 1.00 0.84 0.82 0.75  CALCIUM 7.7* 7.9* 8.2* 7.4* 8.7*  MG  --  2.1 2.0  --   --   PHOS  --  2.7 2.0*  --   --    Recent Labs  Lab 04/30/18 0718  AST 28  ALT 18  ALKPHOS 62  BILITOT 0.4  PROT 7.2  ALBUMIN 4.2   Recent Labs  Lab 04/30/18 0718  05/01/18 0440 05/02/18 0342 05/03/18 0455 05/05/18 0452 05/06/18 0600  WBC 12.5*   < > 17.9* 17.5* 12.2* 8.8 8.3  NEUTROABS 9.7*  --   --   --   --   --   --   HGB 14.2   < > 11.2* 11.2* 10.7* 12.3 12.8  HCT 42.8   < > 33.7* 33.5* 32.4* 36.2 38.7  MCV 94.7   < > 95.7 94.9 94.7 95.0 94.4  PLT 326   < > 288 276 274 346 349   < > = values in this interval not displayed.   No results for input(s): CKTOTAL, CKMB, CKMBINDEX,  TROPONINI in the last 168 hours. No results for input(s): LABPROT, INR in the last 72 hours. No results for input(s): COLORURINE, LABSPEC, PHURINE, GLUCOSEU, HGBUR, BILIRUBINUR, KETONESUR, PROTEINUR, UROBILINOGEN, NITRITE, LEUKOCYTESUR in the last 72 hours.  Invalid input(s): APPERANCEUR     Component Value Date/Time   CHOL 183 05/02/2018 0342   TRIG 186 (H) 05/02/2018 0342   HDL 48 05/02/2018 0342   CHOLHDL 3.8 05/02/2018 0342   VLDL 37 05/02/2018 0342   LDLCALC 98 05/02/2018 0342   Lab Results  Component Value Date   HGBA1C 5.8 (H) 05/02/2018   No results found for: LABOPIA, COCAINSCRNUR, LABBENZ, AMPHETMU, THCU, LABBARB  No results for input(s): ETH in the last 168 hours.  I have personally reviewed the radiological images below and agree with the radiology interpretations.  Ct Head Wo Contrast  Result Date: 05/03/2018 CLINICAL DATA:  Intracranial hemorrhage follow up EXAM: CT HEAD WITHOUT CONTRAST TECHNIQUE: Contiguous axial images were obtained from the base of the skull through the vertex without intravenous contrast. COMPARISON:  Head CT 05/01/2018, 04/30/2018 FINDINGS: Brain: Status post surgical evacuation of right posterior fossa hematoma.  The amount of pneumocephalus has decreased. The fourth ventricle and basal cisterns are patent. No new hemorrhage. No hydrocephalus. The area of hypoattenuation in the right cerebellum has slightly increased in size. There is white matter hypoattenuation of the supratentorial brain consistent with chronic small vessel disease. Old right basal ganglia lacunar infarct is unchanged. Vascular: No abnormal hyperdensity of the major intracranial arteries or dural venous sinuses. No intracranial atherosclerosis. Skull: Status post suboccipital craniectomy. Sinuses/Orbits: No fluid levels or advanced mucosal thickening of the visualized paranasal sinuses. No mastoid or middle ear effusion. The orbits are normal. IMPRESSION: Decreased pneumocephalus and  low products in the right posterior fossa status post surgical evacuation of hematoma. No new abnormality. Electronically Signed   By: Deatra Robinson M.D.   On: 05/03/2018 04:47   Ct Head Wo Contrast  Result Date: 05/01/2018 CLINICAL DATA:  84 year old female with intracranial bleed post surgery with new headache and neck pain. Subsequent encounter. EXAM: CT HEAD WITHOUT CONTRAST TECHNIQUE: Contiguous axial images were obtained from the base of the skull through the vertex without intravenous contrast. COMPARISON:  04/30/2018 CT. FINDINGS: Brain: Post right occipital craniectomy for evacuation of right cerebellar hematoma. Residual blood collection in the right cerebellum now measures 0.6 x 1.5 x 2.1 cm versus prior 4 x 3.9 x 2.6 cm. Small amount of gas within the resection cavity. Mild surrounding vasogenic edema. Decrease mass effect upon the fourth ventricle. Scattered small areas of pneumocephalus bilaterally greatest frontal region without associated mass effect. No new intracranial hemorrhage or CT evidence of large acute infarct. Remote small left thalamic infarct. Chronic microvascular changes. Vascular: Vascular calcifications Skull: Postsurgical changes occipital region. Sinuses/Orbits: No acute orbital abnormality. Visualized paranasal sinuses are clear. Other: Mastoid air cells and middle ear cavities are clear. IMPRESSION: Post right occipital craniectomy for evacuation of right cerebellar hematoma. Residual blood collection in the right cerebellum now measures 0.6 x 1.5 x 2.1 cm versus prior 4 x 3.9 x 2.6 cm. Small amount of gas within the resection cavity. Mild surrounding vasogenic edema. Decrease mass effect upon the fourth ventricle. Scattered small areas of pneumocephalus bilaterally greatest frontal region without associated mass effect. No new intracranial hemorrhage or CT evidence of large acute infarct. Electronically Signed   By: Lacy Duverney M.D.   On: 05/01/2018 10:28   Ct Head Wo  Contrast  Result Date: 04/30/2018 CLINICAL DATA:  84 year old female with altered mental status. Initial encounter. EXAM: CT HEAD WITHOUT CONTRAST TECHNIQUE: Contiguous axial images were obtained from the base of the skull through the vertex without intravenous contrast. COMPARISON:  None. FINDINGS: Brain: 4 x 3.9 x 2.6 cm right cerebellar hemorrhage with surrounding vasogenic edema and local mass effect. Compression of the fourth ventricle. Patient at risk for development of hydrocephalus. Mild superior displacement of the vermis. Minimal inferior displacement right cerebellar tonsil. No other intracranial mass lesion seen on present unenhanced head CT. Remote left thalamic infarct. Moderate chronic microvascular changes. Mild atrophy. Vascular: Vascular calcifications. Skull: No skull fracture. Sinuses/Orbits: No acute orbital abnormality. Visualized paranasal sinuses are clear. Other: Mastoid air cells and middle ear cavities are clear. IMPRESSION: 4 x 3.9 x 2.6 cm right cerebellar hemorrhage with surrounding vasogenic edema and local mass effect. Compression of the fourth ventricle. Patient at risk for development of hydrocephalus. Mild superior displacement of the vermis. Minimal inferior displacement right cerebellar tonsil. Cerebellar hemorrhage may be related to hypertensive bleed. Underlying lesion not excluded. Remote left thalamic infarct. Moderate chronic microvascular changes. These results were called by  telephone at the time of interpretation on 04/30/2018 at 7:30 am to Dr. Ilda Bassetardona who verbally acknowledged these results. Electronically Signed   By: Lacy DuverneySteven  Olson M.D.   On: 04/30/2018 07:42   Dg Chest Portable 1 View  Result Date: 04/30/2018 CLINICAL DATA:  Hypoxia EXAM: PORTABLE CHEST 1 VIEW COMPARISON:  None. FINDINGS: Endotracheal tube tip is 1.2 cm above the carina. Nasogastric tube tip and side port are below the diaphragm. No pneumothorax. No edema or consolidation. Heart size and pulmonary  vascularity are normal. No adenopathy. No bone lesions. IMPRESSION: Tube positions as described without pneumothorax. Note that the endotracheal tube is near the carina. It may be prudent to consider withdrawing endotracheal tube 2-3 cm. No edema or consolidation. Heart size normal. There is aortic atherosclerosis. Aortic Atherosclerosis (ICD10-I70.0). Electronically Signed   By: Bretta BangWilliam  Woodruff III M.D.   On: 04/30/2018 08:17   TTE - Left ventricle: The cavity size was normal. There was moderate   focal basal hypertrophy. Systolic function was normal. The   estimated ejection fraction was in the range of 60% to 65%. Wall   motion was normal; there were no regional wall motion   abnormalities. Features are consistent with a pseudonormal left   ventricular filling pattern, with concomitant abnormal relaxation   and increased filling pressure (grade 2 diastolic dysfunction).   Doppler parameters are consistent with high ventricular filling   pressure. - Aortic valve: Trileaflet; normal thickness, mildly calcified   leaflets. - Mitral valve: Calcified annulus. There was trivial regurgitation. - Left atrium: The atrium was severely dilated. - Right ventricle: The cavity size was mildly dilated. Wall   thickness was normal. - Atrial septum: There was increased thickness of the septum,   consistent with lipomatous hypertrophy. - Tricuspid valve: There was trivial regurgitation. - Pulmonary arteries: Systolic pressure could not be accurately   estimated.   PHYSICAL EXAM  Temp:  [97.6 F (36.4 C)-98.4 F (36.9 C)] 98.4 F (36.9 C) (06/09 0020) Pulse Rate:  [74-94] 91 (06/09 0439) Resp:  [18-21] 19 (06/09 0439) BP: (152-197)/(53-81) 152/81 (06/09 0439) SpO2:  [94 %-98 %] 96 % (06/09 0439)  General - Well nourished, well developed, not in acute distress  Ophthalmologic - fundi not visualized due to noncooperation.  Cardiovascular - Regular rate and rhythm  Mental Status -  Level of  arousal and orientation to time, place, and person were intact. Language including expression, naming, repetition, comprehension was assessed and found intact. Cranial Nerves II - XII - II - Visual field intact OU. III, IV, VI - Extraocular movements intact. V - Facial sensation intact bilaterally. VII - Facial movement intact bilaterally. VIII - Hearing & vestibular intact bilaterally, no significant nystagmus. X - Palate elevates symmetrically. XI - Chin turning & shoulder shrug intact bilaterally. XII - Tongue protrusion intact.  Motor Strength - The patient's strength was symmetrical in all extremities and pronator drift was absent.  Bulk was normal and fasciculations were absent.   Motor Tone - Muscle tone was assessed at the neck and appendages and was normal.  Reflexes - The patient's reflexes were symmetrical in all extremities and she had no pathological reflexes.  Sensory - Light touch, temperature/pinprick were assessed and were symmetrical.    Coordination - The patient had right FTN and HTS ataxia, LUE and LLE intact coordination.  Tremor was absent.  Gait and Station - deferred.  ASSESSMENT/PLAN Ms. Yehuda Budddith S Jacquez is a 84 y.o. female with no significant history admitted for vertigo, N/V.  No tPA given due to ICH.    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 I cannot rule out an initial ischemic infarct with hemorrhagic transformation but less likely.  Her BP is well controlled today with SBP in the 150's.     She has several intracranial aneurysms that are not related to her current presentation.  Risk of rupture is low given their size.  They need to be monitored on yearly basis.   She is very uncomfortable with the vertigo.  Scopolamine patch is not helped but placed on arm and may affect efficacy.  Will inform RN to put behind the ear.  It will require more time and physical therapy.   Weston Settle, MS, MD    ICH:  right  cerebellar large ICH s/p suboccipital decompression and hematoma evacuation.  Resultant right ataxia, vertigo, anusea and HA  CT head right cerebellar large ICH  CT head repeat - s/p evacuation and mass effect much improved.   CT repeat stable right cerebellar edema without hydrocephalus  2D Echo EF 60-65%  LDL 98  HgbA1c 5.8  SCDs and subq heparin for VTE prophylaxis   On diet  aspirin 81 mg daily prior to admission, now on No antithrombotic due to ICH  Ongoing aggressive stroke risk factor management  Therapy recommendations:  pending  Disposition:  Pending  Cerebellar edema  CT repeat 05/01/18 - s/p evacuation with improvement of mass effect  CT repeat 05/03/18 stable right cerebellar edema without hydrocephalus  MRI and MRA pending  Na 136 -> 139  D/c salt tablet, clinically stable  Hypertension Still on cleviprex   Needed labetalol PRN overnight  BP goal < 160  On hydralazine IV PRN  Maximized on amlodipine and lisinopril dose  Added hydralazine 50mg  Q8h  Hyperlipidemia   No statins PTA  LDL 98, goal < 70  Consider statin on discharge  Other Stroke Risk Factors  Advanced age  Other Active Problems  Elevated cre 1.13-> 1.00->0.84  Leukocytosis WBC 17.9-> 17.5->12.2 (afebrile)  Hospital day # 6  This patient is critically ill due to large cerebellar infarct, s/p evacuation, hypertensive emergency and at significant risk of neurological worsening, death form rebleed, ischemic stroke, seizure, cerebral edema, brain herniation. This patient's care requires constant monitoring of vital signs, hemodynamics, respiratory and cardiac monitoring, review of multiple databases, neurological assessment, discussion with family, other specialists and medical decision making of high complexity. I spent 35 minutes of neurocritical care time in the care of this patient.      To contact Stroke Continuity provider, please refer to [email or web address]. After hours,  contact General Neurology

## 2018-05-06 NOTE — Progress Notes (Addendum)
0715 Bedside shift report. Pt resting in bed, c/o vertigo, no other complaints at time. [facility]   0800 Pt assessed, see flow sheet. Complains of dizziness only. Neuro intact, WCTM.   0900 RN called to room, pt c/o nausea, medicated per MAR, WCTM.   1115 Pt denies nausea, c/o vertigo. Medicated per MAR. Refusing to walk at time because of dizziness.   1345 Pt sleeping, easy to arouse, medicated per MAR, still c/o vertigo and nausea, but ate some of lunch. WCTM.   1600 Pt still resting comfortably in bed, still c/o nausea and vertigo. Nothing helping, WCTM.   1915 Pt called to room, c/o bilateral neck pain, [facility], [facility] Charity fundraiserN.

## 2018-05-07 ENCOUNTER — Encounter (HOSPITAL_COMMUNITY): Payer: Self-pay | Admitting: Emergency Medicine

## 2018-05-07 ENCOUNTER — Inpatient Hospital Stay (HOSPITAL_COMMUNITY)
Admission: RE | Admit: 2018-05-07 | Discharge: 2018-05-22 | DRG: 057 | Disposition: A | Discharge status: Home Health | Payer: Medicare Other | Source: Intra-hospital | Attending: Physical Medicine & Rehabilitation | Admitting: Physical Medicine & Rehabilitation

## 2018-05-07 ENCOUNTER — Other Ambulatory Visit: Payer: Self-pay

## 2018-05-07 ENCOUNTER — Encounter (HOSPITAL_COMMUNITY): Payer: Self-pay | Admitting: Physical Medicine and Rehabilitation

## 2018-05-07 DIAGNOSIS — R278 Other lack of coordination: Secondary | ICD-10-CM | POA: Diagnosis present

## 2018-05-07 DIAGNOSIS — Z801 Family history of malignant neoplasm of trachea, bronchus and lung: Secondary | ICD-10-CM

## 2018-05-07 DIAGNOSIS — E785 Hyperlipidemia, unspecified: Secondary | ICD-10-CM | POA: Diagnosis present

## 2018-05-07 DIAGNOSIS — F17210 Nicotine dependence, cigarettes, uncomplicated: Secondary | ICD-10-CM | POA: Diagnosis present

## 2018-05-07 DIAGNOSIS — I10 Essential (primary) hypertension: Secondary | ICD-10-CM | POA: Diagnosis present

## 2018-05-07 DIAGNOSIS — I614 Nontraumatic intracerebral hemorrhage in cerebellum: Secondary | ICD-10-CM | POA: Diagnosis present

## 2018-05-07 DIAGNOSIS — F688 Other specified disorders of adult personality and behavior: Secondary | ICD-10-CM | POA: Diagnosis present

## 2018-05-07 DIAGNOSIS — Z7982 Long term (current) use of aspirin: Secondary | ICD-10-CM

## 2018-05-07 DIAGNOSIS — I69122 Dysarthria following nontraumatic intracerebral hemorrhage: Secondary | ICD-10-CM | POA: Diagnosis not present

## 2018-05-07 DIAGNOSIS — I69393 Ataxia following cerebral infarction: Secondary | ICD-10-CM

## 2018-05-07 DIAGNOSIS — R066 Hiccough: Secondary | ICD-10-CM | POA: Diagnosis present

## 2018-05-07 DIAGNOSIS — I69198 Other sequelae of nontraumatic intracerebral hemorrhage: Secondary | ICD-10-CM

## 2018-05-07 DIAGNOSIS — Z79890 Hormone replacement therapy (postmenopausal): Secondary | ICD-10-CM

## 2018-05-07 DIAGNOSIS — Z6821 Body mass index (BMI) 21.0-21.9, adult: Secondary | ICD-10-CM | POA: Diagnosis not present

## 2018-05-07 DIAGNOSIS — F419 Anxiety disorder, unspecified: Secondary | ICD-10-CM | POA: Diagnosis present

## 2018-05-07 DIAGNOSIS — Z9889 Other specified postprocedural states: Secondary | ICD-10-CM

## 2018-05-07 DIAGNOSIS — D62 Acute posthemorrhagic anemia: Secondary | ICD-10-CM | POA: Diagnosis present

## 2018-05-07 DIAGNOSIS — R7303 Prediabetes: Secondary | ICD-10-CM

## 2018-05-07 DIAGNOSIS — Z79899 Other long term (current) drug therapy: Secondary | ICD-10-CM | POA: Diagnosis not present

## 2018-05-07 DIAGNOSIS — I69193 Ataxia following nontraumatic intracerebral hemorrhage: Principal | ICD-10-CM

## 2018-05-07 DIAGNOSIS — E440 Moderate protein-calorie malnutrition: Secondary | ICD-10-CM | POA: Diagnosis present

## 2018-05-07 DIAGNOSIS — I69319 Unspecified symptoms and signs involving cognitive functions following cerebral infarction: Secondary | ICD-10-CM

## 2018-05-07 DIAGNOSIS — Z789 Other specified health status: Secondary | ICD-10-CM | POA: Diagnosis not present

## 2018-05-07 DIAGNOSIS — E039 Hypothyroidism, unspecified: Secondary | ICD-10-CM | POA: Diagnosis present

## 2018-05-07 DIAGNOSIS — I69119 Unspecified symptoms and signs involving cognitive functions following nontraumatic intracerebral hemorrhage: Secondary | ICD-10-CM

## 2018-05-07 DIAGNOSIS — G936 Cerebral edema: Secondary | ICD-10-CM

## 2018-05-07 DIAGNOSIS — R42 Dizziness and giddiness: Secondary | ICD-10-CM

## 2018-05-07 DIAGNOSIS — Z833 Family history of diabetes mellitus: Secondary | ICD-10-CM | POA: Diagnosis not present

## 2018-05-07 DIAGNOSIS — I671 Cerebral aneurysm, nonruptured: Secondary | ICD-10-CM | POA: Diagnosis present

## 2018-05-07 DIAGNOSIS — E876 Hypokalemia: Secondary | ICD-10-CM | POA: Diagnosis present

## 2018-05-07 DIAGNOSIS — R29700 NIHSS score 0: Secondary | ICD-10-CM | POA: Diagnosis present

## 2018-05-07 DIAGNOSIS — R251 Tremor, unspecified: Secondary | ICD-10-CM | POA: Diagnosis not present

## 2018-05-07 DIAGNOSIS — R51 Headache: Secondary | ICD-10-CM | POA: Diagnosis present

## 2018-05-07 DIAGNOSIS — R0989 Other specified symptoms and signs involving the circulatory and respiratory systems: Secondary | ICD-10-CM | POA: Diagnosis not present

## 2018-05-07 DIAGNOSIS — H8149 Vertigo of central origin, unspecified ear: Secondary | ICD-10-CM | POA: Diagnosis not present

## 2018-05-07 LAB — GLUCOSE, CAPILLARY
GLUCOSE-CAPILLARY: 118 mg/dL — AB (ref 65–99)
GLUCOSE-CAPILLARY: 138 mg/dL — AB (ref 65–99)
GLUCOSE-CAPILLARY: 114 mg/dL — AB (ref 65–99)
Glucose-Capillary: 118 mg/dL — ABNORMAL HIGH (ref 65–99)

## 2018-05-07 LAB — CBC
HCT: 37.7 % (ref 36.0–46.0)
HEMOGLOBIN: 12.5 g/dL (ref 12.0–15.0)
MCH: 31.4 pg (ref 26.0–34.0)
MCHC: 33.2 g/dL (ref 30.0–36.0)
MCV: 94.7 fL (ref 78.0–100.0)
PLATELETS: 358 10*3/uL (ref 150–400)
RBC: 3.98 MIL/uL (ref 3.87–5.11)
RDW: 13.5 % (ref 11.5–15.5)
WBC: 9.6 10*3/uL (ref 4.0–10.5)

## 2018-05-07 LAB — BASIC METABOLIC PANEL
ANION GAP: 8 (ref 5–15)
BUN: 9 mg/dL (ref 6–20)
CHLORIDE: 103 mmol/L (ref 101–111)
CO2: 28 mmol/L (ref 22–32)
Calcium: 8.3 mg/dL — ABNORMAL LOW (ref 8.9–10.3)
Creatinine, Ser: 0.77 mg/dL (ref 0.44–1.00)
GFR calc Af Amer: >60 mL/min (ref >60)
Glucose, Bld: 107 mg/dL — ABNORMAL HIGH (ref 65–99)
POTASSIUM: 3.2 mmol/L — AB (ref 3.5–5.1)
Sodium: 139 mmol/L (ref 135–145)

## 2018-05-07 MED ORDER — POLYETHYLENE GLYCOL 3350 17 G PO PACK
17.0000 g | PACK | Freq: Every day | ORAL | Status: DC | PRN
  Administered 2018-05-08 – 2018-05-10 (×2): 17 g via ORAL
  Filled 2018-05-07 (×2): qty 1

## 2018-05-07 MED ORDER — HYDRALAZINE HCL 50 MG PO TABS
50.0000 mg | ORAL_TABLET | Freq: Three times a day (TID) | ORAL | Status: DC
  Administered 2018-05-07 – 2018-05-20 (×40): 50 mg via ORAL
  Filled 2018-05-07 (×41): qty 1

## 2018-05-07 MED ORDER — AMLODIPINE BESYLATE 10 MG PO TABS
10.0000 mg | ORAL_TABLET | Freq: Every day | ORAL | Status: DC
  Administered 2018-05-08 – 2018-05-22 (×14): 10 mg via ORAL
  Filled 2018-05-07 (×15): qty 1

## 2018-05-07 MED ORDER — MECLIZINE HCL 25 MG PO TABS
12.5000 mg | ORAL_TABLET | Freq: Three times a day (TID) | ORAL | Status: DC | PRN
  Administered 2018-05-08: 12.5 mg via ORAL
  Filled 2018-05-07 (×3): qty 1

## 2018-05-07 MED ORDER — ADULT MULTIVITAMIN W/MINERALS CH
1.0000 | ORAL_TABLET | Freq: Every day | ORAL | Status: DC
  Administered 2018-05-08 – 2018-05-22 (×15): 1 via ORAL
  Filled 2018-05-07 (×15): qty 1

## 2018-05-07 MED ORDER — ACETAMINOPHEN 325 MG PO TABS
325.0000 mg | ORAL_TABLET | ORAL | Status: DC | PRN
  Administered 2018-05-08: 325 mg via ORAL
  Administered 2018-05-10 – 2018-05-12 (×2): 650 mg via ORAL
  Administered 2018-05-14: 325 mg via ORAL
  Administered 2018-05-15 – 2018-05-21 (×3): 650 mg via ORAL
  Filled 2018-05-07 (×2): qty 2
  Filled 2018-05-07: qty 1
  Filled 2018-05-07 (×4): qty 2

## 2018-05-07 MED ORDER — DIPHENHYDRAMINE HCL 12.5 MG/5ML PO ELIX: 12.5000 mg | ORAL_SOLUTION | Freq: Four times a day (QID) | ORAL | Status: DC | PRN

## 2018-05-07 MED ORDER — INSULIN ASPART 100 UNIT/ML ~~LOC~~ SOLN
0.0000 [IU] | Freq: Three times a day (TID) | SUBCUTANEOUS | Status: DC
  Administered 2018-05-11: 1 [IU] via SUBCUTANEOUS
  Administered 2018-05-12 – 2018-05-13 (×2): 2 [IU] via SUBCUTANEOUS

## 2018-05-07 MED ORDER — PROCHLORPERAZINE EDISYLATE 10 MG/2ML IJ SOLN: 5.0000 mg | Freq: Four times a day (QID) | INTRAMUSCULAR | Status: DC | PRN

## 2018-05-07 MED ORDER — LEVOTHYROXINE SODIUM 100 MCG PO TABS
100.0000 ug | ORAL_TABLET | Freq: Every day | ORAL | Status: DC
  Administered 2018-05-08 – 2018-05-22 (×14): 100 ug via ORAL
  Filled 2018-05-07 (×15): qty 1

## 2018-05-07 MED ORDER — POTASSIUM CHLORIDE CRYS ER 20 MEQ PO TBCR
20.0000 meq | EXTENDED_RELEASE_TABLET | Freq: Two times a day (BID) | ORAL | Status: DC
  Administered 2018-05-07 – 2018-05-08 (×2): 20 meq via ORAL
  Filled 2018-05-07 (×2): qty 1

## 2018-05-07 MED ORDER — NALOXONE HCL 0.4 MG/ML IJ SOLN: 0.0800 mg | INTRAMUSCULAR | Status: DC | PRN

## 2018-05-07 MED ORDER — FLEET ENEMA 7-19 GM/118ML RE ENEM: 1.0000 | ENEMA | Freq: Once | RECTAL | Status: DC | PRN

## 2018-05-07 MED ORDER — TRAZODONE HCL 50 MG PO TABS: 25.0000 mg | ORAL_TABLET | Freq: Every evening | ORAL | Status: DC | PRN

## 2018-05-07 MED ORDER — PROCHLORPERAZINE 25 MG RE SUPP: 12.5000 mg | Freq: Four times a day (QID) | RECTAL | Status: DC | PRN

## 2018-05-07 MED ORDER — ADULT MULTIVITAMIN W/MINERALS CH: 1.0000 | ORAL_TABLET | Freq: Every day | ORAL | Status: DC

## 2018-05-07 MED ORDER — BISACODYL 10 MG RE SUPP
10.0000 mg | Freq: Every day | RECTAL | Status: DC | PRN
  Administered 2018-05-10: 10 mg via RECTAL
  Filled 2018-05-07: qty 1

## 2018-05-07 MED ORDER — SCOPOLAMINE 1 MG/3DAYS TD PT72: 1.0000 | MEDICATED_PATCH | TRANSDERMAL | Status: DC

## 2018-05-07 MED ORDER — PREMIER PROTEIN SHAKE
11.0000 [oz_av] | Freq: Four times a day (QID) | ORAL | Status: DC
  Administered 2018-05-07 – 2018-05-21 (×23): 11 [oz_av] via ORAL
  Filled 2018-05-07 (×83): qty 325.31

## 2018-05-07 MED ORDER — TRAMADOL HCL 50 MG PO TABS
50.0000 mg | ORAL_TABLET | Freq: Four times a day (QID) | ORAL | Status: DC | PRN
  Administered 2018-05-20 – 2018-05-21 (×2): 50 mg via ORAL
  Filled 2018-05-07 (×2): qty 1

## 2018-05-07 MED ORDER — SENNOSIDES-DOCUSATE SODIUM 8.6-50 MG PO TABS
2.0000 | ORAL_TABLET | Freq: Every day | ORAL | Status: DC
  Administered 2018-05-07 – 2018-05-21 (×15): 2 via ORAL
  Filled 2018-05-07 (×15): qty 2

## 2018-05-07 MED ORDER — GUAIFENESIN-DM 100-10 MG/5ML PO SYRP: 5.0000 mL | ORAL_SOLUTION | Freq: Four times a day (QID) | ORAL | Status: DC | PRN

## 2018-05-07 MED ORDER — LISINOPRIL 20 MG PO TABS
20.0000 mg | ORAL_TABLET | Freq: Two times a day (BID) | ORAL | Status: DC
  Administered 2018-05-07 – 2018-05-22 (×29): 20 mg via ORAL
  Filled 2018-05-07 (×30): qty 1

## 2018-05-07 MED ORDER — ALUM & MAG HYDROXIDE-SIMETH 200-200-20 MG/5ML PO SUSP: 30.0000 mL | ORAL | Status: DC | PRN

## 2018-05-07 MED ORDER — MAGNESIUM OXIDE 400 (241.3 MG) MG PO TABS
200.0000 mg | ORAL_TABLET | Freq: Two times a day (BID) | ORAL | Status: DC
  Administered 2018-05-07 – 2018-05-22 (×30): 200 mg via ORAL
  Filled 2018-05-07 (×30): qty 1

## 2018-05-07 MED ORDER — HYDROCODONE-ACETAMINOPHEN 5-325 MG PO TABS
1.0000 | ORAL_TABLET | ORAL | Status: DC | PRN
Start: 2018-05-07 — End: 2018-05-09
  Administered 2018-05-08: 2 via ORAL
  Filled 2018-05-07: qty 2

## 2018-05-07 MED ORDER — CLONIDINE HCL 0.1 MG PO TABS
0.1000 mg | ORAL_TABLET | Freq: Three times a day (TID) | ORAL | Status: DC | PRN
  Administered 2018-05-08 (×2): 0.1 mg via ORAL
  Filled 2018-05-07 (×2): qty 1

## 2018-05-07 MED ORDER — HEPARIN SODIUM (PORCINE) 5000 UNIT/ML IJ SOLN
5000.0000 [IU] | Freq: Two times a day (BID) | INTRAMUSCULAR | Status: DC
  Administered 2018-05-07 – 2018-05-15 (×16): 5000 [IU] via SUBCUTANEOUS
  Filled 2018-05-07 (×16): qty 1

## 2018-05-07 MED ORDER — PROCHLORPERAZINE MALEATE 5 MG PO TABS
5.0000 mg | ORAL_TABLET | Freq: Four times a day (QID) | ORAL | Status: DC | PRN
  Administered 2018-05-07: 10 mg via ORAL
  Administered 2018-05-08 – 2018-05-21 (×2): 5 mg via ORAL
  Filled 2018-05-07: qty 1
  Filled 2018-05-07: qty 2
  Filled 2018-05-07: qty 1

## 2018-05-07 NOTE — Progress Notes (Addendum)
Pt A/Ox4, skin intact. Surgical incision occipital area sutures, old dry drainage. Pt notes she is experiencing some vertigo, her last experience of vertigo was in the 70's. Vertigo is noted along with nausea. BLE bruising and scattered bruising BUE. IJ double lumen. Call light at hand, bed alarm. Pt smokes three to fours cigarettes a day, have not smoked in four weeks. Upper dentures. LBM 04/29/18, she notes she does not feel constipated and does not want anything for it. Educated pt on unit, physical therapy will assist her tomorrow.

## 2018-05-07 NOTE — Progress Notes (Signed)
Standley BrookingBoyette, Treasa Bradshaw G, RN  Rehab Admission Coordinator  Physical Medicine and Rehabilitation  PMR Pre-admission  Signed  Date of Service:  05/07/2018 1:31 PM       Related encounter: ED to Hosp-Admission (Discharged) from 04/30/2018 in [facility]      Signed           Show:Clear all [x] Manual[x] Template[x] Copied  Added by: [x] Standley BrookingBoyette, Analiya Porco G, RN   [] Hover for details   PMR Admission Coordinator Pre-Admission Assessment  Patient: Tiffany Thornton is an 84 y.o., female MRN: [id number] DOB: 1933/08/11 Height: 5' 8.11" (173 cm) Weight: 64.6 kg (142 lb 6.7 oz)                                                                                                                                                  Insurance Information HMO: yes    PPO:      PCP:      IPA:      80/20:      OTHER: medicare advantage plan PRIMARY: [employer] Medicare      Policy#: [id number]      Subscriber: pt CM Name: Gweneth DimitriLisa Jacobs      Phone#: [phone]     Fax#: [phone] approved for 7 days with f/u Rebeca AlertSunny Smith Pre-Cert#: [id number]      Employer: retired Charity fundraiserN Benefits:  Phone #: [phone]     Name: 05/04/18 Eff. Date: 11/28/2017     Deduct: none      Out of Pocket Max: $4400      Life Max: none CIR: $345 co pay per day days 1 until 5 then insurance covers 100%      SNF: no co pay per day days 1 until 20; $160 co pay per day days 21-48; no co pay days 49 to 100 Outpatient: $40 co pay per visit     Co-Pay: visits per medical neccesity Home Health: 100%      Co-Pay: visits per medical neccesity DME: 80%     Co-Pay: 20% Providers: in network  SECONDARY: none       Medicaid Application Date:       Case Manager:  Disability Application Date:       Case Worker:   Emergency Chief Operating OfficerContact Information         Contact Information    Name Relation Home Work [employer]   N[place]le,[name] Son [phone]  [phone]     Current Medical History  Patient Admitting Diagnosis: fight  cerebellar hemorrhage  History of Present Illness:  Tiffany Thornton is an 84 year old female with history of hypothyroid otherwise in good health; who was admitted on 04/30/18 withacute mental status changes with nausea,vomiting and vertigo. She was noted to have hypertensive emergency with systolic blood pressure greater than 200 and CT of head done revealing large acute right  cerebellar hemorrhage with mass-effect on 4th ventricle and waxing an waning of symptoms. She was evaluated by Dr. Yetta Barre and taken to OR for suboccipital craniectomy and evacuation of hemorrhage. Post op with mild dysarthria and right ataxia. 2 D echo showed EF 60-65% with grade 2 diastolic dysfunction and calcified mitral annulus with trivial regurgitation.   Dr. Roda Shutters felt that bleed due to hypertensive emergency and SBP goals <160. MRI/MRA brain done revealing stable bleed with acute/subacute nonhemorrhagic infarct lateral to cerebellar hemorrhage, decreasing mass effect and no hydrocephalus and multiple intracranial aneurysms. Scopolamine patch added to help with vertigo and mobility improving. She continues to have dizziness with activity as well as poor safety awareness.   Total: 0 NIH  Past Medical History      Past Medical History:  Diagnosis Date  . Hyperlipidemia   . Hypothyroid     Family History  family history is not on file.  Prior Rehab/Hospitalizations:  Has the patient had major surgery during 100 days prior to admission? No  Current Medications   Current Facility-Administered Medications:  .  0.9 %  sodium chloride infusion, , Intravenous, Continuous, Pool, Sherilyn Cooter, MD, Last Rate: 50 mL/hr at 05/07/18 1200 .  acetaminophen (TYLENOL) tablet 650 mg, 650 mg, Oral, Q4H PRN, 650 mg at 05/03/18 1317 **OR** acetaminophen (TYLENOL) suppository 650 mg, 650 mg, Rectal, Q4H PRN, Julio Sicks, MD .  amLODipine (NORVASC) tablet 10 mg, 10 mg, Oral, Daily, Pool, Sherilyn Cooter, MD, 10 mg at 05/07/18 1014 .   bisacodyl (DULCOLAX) suppository 10 mg, 10 mg, Rectal, Daily PRN, Julio Sicks, MD .  Chlorhexidine Gluconate Cloth 2 % PADS 6 each, 6 each, Topical, Q0600, Julio Sicks, MD, 6 each at 05/07/18 0524 .  clevidipine (CLEVIPREX) infusion 0.5 mg/mL, 0-21 mg/hr, Intravenous, Continuous, Julio Sicks, MD, Stopped at 05/04/18 1400 .  docusate sodium (COLACE) capsule 100 mg, 100 mg, Oral, BID, Pool, Sherilyn Cooter, MD, 100 mg at 05/07/18 1014 .  heparin injection 5,000 Units, 5,000 Units, Subcutaneous, Q12H, Pool, Sherilyn Cooter, MD, 5,000 Units at 05/07/18 1014 .  hydrALAZINE (APRESOLINE) injection 5-10 mg, 5-10 mg, Intravenous, Q4H PRN, Julio Sicks, MD, 5 mg at 05/07/18 1036 .  hydrALAZINE (APRESOLINE) tablet 50 mg, 50 mg, Oral, Q8H, Pool, Sherilyn Cooter, MD, 50 mg at 05/07/18 0511 .  HYDROcodone-acetaminophen (NORCO/VICODIN) 5-325 MG per tablet 1-2 tablet, 1-2 tablet, Oral, Q4H PRN, Julio Sicks, MD, 1 tablet at 05/04/18 2027 .  HYDROmorphone (DILAUDID) injection 0.5 mg, 0.5 mg, Intravenous, Q2H PRN, Julio Sicks, MD, 0.5 mg at 05/07/18 0512 .  insulin aspart (novoLOG) injection 0-9 Units, 0-9 Units, Subcutaneous, TID WC, Julio Sicks, MD, 1 Units at 05/07/18 1312 .  levothyroxine (SYNTHROID, LEVOTHROID) tablet 100 mcg, 100 mcg, Oral, QAC breakfast, Julio Sicks, MD, 100 mcg at 05/07/18 1014 .  lisinopril (PRINIVIL,ZESTRIL) tablet 20 mg, 20 mg, Oral, BID, Julio Sicks, MD, 20 mg at 05/07/18 1014 .  meclizine (ANTIVERT) tablet 12.5 mg, 12.5 mg, Oral, TID PRN, Julio Sicks, MD, 12.5 mg at 05/07/18 0429 .  multivitamin with minerals tablet 1 tablet, 1 tablet, Oral, Daily, Pool, Sherilyn Cooter, MD, 1 tablet at 05/07/18 1014 .  naloxone (NARCAN) injection 0.08 mg, 0.08 mg, Intravenous, PRN, Julio Sicks, MD .  ondansetron (ZOFRAN) tablet 4 mg, 4 mg, Oral, Q4H PRN **OR** ondansetron (ZOFRAN) injection 4 mg, 4 mg, Intravenous, Q4H PRN, Julio Sicks, MD, 4 mg at 05/06/18 1922 .  promethazine (PHENERGAN) tablet 12.5-25 mg, 12.5-25 mg, Oral, Q4H PRN, Julio Sicks, MD, 25 mg at 05/04/18 2030 .  scopolamine (TRANSDERM-SCOP) 1 MG/3DAYS 1.5 mg, 1 patch, Transdermal, Q72H, Nyanor, Pearl O, NP, 1.5 mg at 05/06/18 1345  Patients Current Diet:       Diet Order           Diet Heart Room service appropriate? Yes; Fluid consistency: Thin  Diet effective now          Precautions / Restrictions Precautions Precautions: Fall Precaution Comments: Dizziness with activity, keep Systolic BP<160 Restrictions Weight Bearing Restrictions: No   Has the patient had 2 or more falls or a fall with injury in the past year?No  Prior Activity Level Community (5-7x/wk): very active and independdent; retired Charity fundraiser; Programmer, multimedia / Equipment Home Assistive Devices/Equipment: None Home Equipment: None  Prior Device Use: Indicate devices/aids used by the patient prior to current illness, exacerbation or injury? None of the above  Prior Functional Level Prior Function Level of Independence: Independent Comments: 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, former Charity fundraiser at [employer]  Self Care: Did the patient need help bathing, dressing, using the toilet or eating?  Independent  Indoor Mobility: Did the patient need assistance with walking from room to room (with or without device)? Independent  Stairs: Did the patient need assistance with internal or external stairs (with or without device)? Independent  Functional Cognition: Did the patient need help planning regular tasks such as shopping or remembering to take medications? Independent  Current Functional Level Cognition  Overall Cognitive Status: Impaired/Different from baseline Orientation Level: Oriented X4 Following Commands: Follows one step commands inconsistently Safety/Judgement: Decreased awareness of safety General Comments: Pt distracted and inconsistently following cues for gaze stabilization. Pt lacks awareness of deficits as she states that "everything will be ok once  she gets home and uses the push mower"    Extremity Assessment (includes Sensation/Coordination)  Upper Extremity Assessment: Defer to OT evaluation  Lower Extremity Assessment: Overall WFL for tasks assessed, RLE deficits/detail, LLE deficits/detail RLE Coordination: decreased fine motor LLE Sensation: decreased light touch LLE Coordination: decreased fine motor    ADLs  Overall ADL's : Needs assistance/impaired Eating/Feeding: Set up, Bed level Eating/Feeding Details (indicate cue type and reason): Pt able to bring hand to mouth Grooming: Moderate assistance, Sitting Grooming Details (indicate cue type and reason): supported sitting Upper Body Bathing: Moderate assistance Lower Body Bathing: Moderate assistance, Sitting/lateral leans Lower Body Bathing Details (indicate cue type and reason): Pt able to bring LLE cross to knees with mod A for sitting balance Upper Body Dressing : Moderate assistance Lower Body Dressing: Minimal assistance, +2 for safety/equipment, +2 for physical assistance, Sit to/from stand Toilet Transfer: Minimal assistance, +2 for physical assistance, +2 for safety/equipment, RW(sit to stand only this session) Toilet Transfer Details (indicate cue type and reason): strong posterior lean with face to face, much safer and better balance with RW - vc for safe hand placement Toileting- Clothing Manipulation and Hygiene: Maximal assistance, Sit to/from stand General ADL Comments: limited by dizziness/nausea throughout assessment; trouble keeping eyes open at this time throughout session despite cues    Mobility  Overal bed mobility: Needs Assistance Bed Mobility: Sit to Supine Supine to sit: Mod assist Sit to supine: Min guard General bed mobility comments: Min guard for safety. Pt dizzy with movement    Transfers  Overall transfer level: Needs assistance Equipment used: Standard walker Transfers: Sit to/from Stand Sit to Stand: Min guard, Min  assist General transfer comment: Cues for hand placement. Min A on trial 1, min guard on trial  2 with increased time required.    Ambulation / Gait / Stairs / Wheelchair Mobility  Ambulation/Gait Ambulation/Gait assistance: Mod assist, Min assist Ambulation Distance (Feet): 20 Feet Assistive device: Rolling walker (2 wheeled) Gait Pattern/deviations: Step-through pattern, Decreased stride length General Gait Details: 2 ambulation trials each ~20 ft. Pt c/o of dizziness and asked to sit. Pt min A for majority of ambulation with mod A required for LOB during turn and L lean when dizzy. Later in session, pt improved with gaze stabilization and able to make turn without LOB. Pt had difficulty with gaze stabilization throughout session. Pt demonstrates bilateral decreased LE clearance with decreased stride length and frequent cueing to remain in proximity to RW Gait velocity: slow Gait velocity interpretation: <1.31 ft/sec, indicative of household ambulator    Posture / Balance Dynamic Sitting Balance Sitting balance - Comments: requiring external balance support Balance Overall balance assessment: Needs assistance Sitting-balance support: No upper extremity supported, Feet supported Sitting balance-Leahy Scale: Fair Sitting balance - Comments: requiring external balance support Postural control: Posterior lean Standing balance support: Bilateral upper extremity supported Standing balance-Leahy Scale: Poor Standing balance comment: dependent on external support from RW    Special needs/care consideration BiPAP/CPAP  N/a CPM  N/a Continuous Drip IV  N/a Dialysis  N/a Life Vest  N/a Oxygen n/a Special Bed  N/a Trach Size n/a Wound Vac n/a Skin surgical incision to head; ecchymosis anterior to Bil LE and UE Bowel mgmt: no LBM documented Bladder mgmt: continent Diabetic mgmt  N/a Continued with nausea and vomiting     Previous Home Environment Living Arrangements: Alone   Lives With: Alone Available Help at Discharge: Family, Available 24 hours/day Type of Home: House Home Layout: One level Home Access: Stairs to enter Entrance Stairs-Rails: None Secretary/administrator of Steps: 1 Bathroom Shower/Tub: Health visitor: Standard Bathroom Accessibility: Yes How Accessible: Accessible via walker Home Care Services: No  Discharge Living Setting Plans for Discharge Living Setting: Patient's home, Alone Type of Home at Discharge: House Discharge Home Layout: One level Discharge Home Access: Stairs to enter Entrance Stairs-Rails: None Entrance Stairs-Number of Steps: 1 Discharge Bathroom Shower/Tub: Garment/textile technologist: Standard Discharge Bathroom Accessibility: Yes How Accessible: Accessible via walker Does the patient have any problems obtaining your medications?: No  Social/Family/Support Systems Patient Roles: Parent Contact Information: [name], son and POA, main contact Anticipated Caregiver: family Anticipated Caregiver's Contact Information: see above Ability/Limitations of Caregiver: family work and can will arrange family support 24/7 initially Caregiver Availability: 24/7 Discharge Plan Discussed with Primary Caregiver: Yes Is Caregiver In Agreement with Plan?: Yes Does Caregiver/Family have Issues with Lodging/Transportation while Pt is in Rehab?: No   Goals/Additional Needs Patient/Family Goal for Rehab: supervision wiht PT, OT and SLP Expected length of stay: ELOS 12 to 18 days Special Service Needs: dizziness and nausea persist Pt/Family Agrees to Admission and willing to participate: Yes Program Orientation Provided & Reviewed with Pt/Caregiver Including Roles  & Responsibilities: Yes  Decrease burden of Care through IP rehab admission:  N/a  Possible need for SNF placement upon discharge: not anticipated  Patient Condition: This patient's medical and functional status has changed since  the consult dated: 05/03/2018 in which the Rehabilitation Physician determined and documented that the patient's condition is appropriate for intensive rehabilitative care in an inpatient rehabilitation facility. See "History of Present Illness" (above) for medical update. Functional changes are: overall mod assist. Patient's medical and functional status update has been discussed with the Rehabilitation physician and  patient remains appropriate for inpatient rehabilitation. Will admit to inpatient rehab today.  Preadmission Screen Completed By:  Clois Dupes, 05/07/2018 1:32 PM ______________________________________________________________________   Discussed status with Dr. Allena Katz on 05/07/2018 at  1352 and received telephone approval for admission today.  Admission Coordinator:  Clois Dupes, time 1610 Date 05/07/2018             Cosigned by: Marcello Fennel, MD at 05/07/2018 2:13 PM  Revision History

## 2018-05-07 NOTE — Progress Notes (Signed)
Physical Therapy Treatment Patient Details Name: Tiffany Budddith S Narramore MRN: [id number] DOB: Oct 16, 1933 Today's Date: 05/07/2018    History of Present Illness Ms. Tiffany Budddith S Astorga is a 84 y.o. female with no significant history admitted for vertigo, N/V. S/p suboccipital craniectomy for cerebellar hematoma.    PT Comments    Pt lacks safety awareness as pt states that she is ready to return home and mow her lawn. Pt complaining of dizziness with mobility and having difficulty following commands related to gaze stabilization. Pt ambulated short distances with min guard-mod A required dependent on dizziness. BP readings high pre and post gait (pre LUE: 182/72, Pre RUE: 182/78, post LUE: 173/66). Pt medicated prior to mobilization.  Follow Up Recommendations  CIR;Supervision/Assistance - 24 hour     Equipment Recommendations  Rolling walker with 5" wheels    Recommendations for Other Services       Precautions / Restrictions Precautions Precautions: Fall Precaution Comments: Dizziness with activity, keep Systolic BP<160 Restrictions Weight Bearing Restrictions: No    Mobility  Bed Mobility Overal bed mobility: Needs Assistance Bed Mobility: Sit to Supine       Sit to supine: Min guard   General bed mobility comments: Min guard for safety. Pt dizzy with movement  Transfers Overall transfer level: Needs assistance Equipment used: Standard walker Transfers: Sit to/from Stand Sit to Stand: Min guard;Min assist         General transfer comment: Cues for hand placement. Min A on trial 1, min guard on trial 2 with increased time required.  Ambulation/Gait Ambulation/Gait assistance: Mod assist;Min assist Ambulation Distance (Feet): 20 Feet Assistive device: Rolling walker (2 wheeled) Gait Pattern/deviations: Step-through pattern;Decreased stride length Gait velocity: slow   General Gait Details: 2 ambulation trials each ~20 ft. Pt c/o of dizziness and asked to sit. Pt min A for  majority of ambulation with mod A required for LOB during turn and L lean when dizzy. Later in session, pt improved with gaze stabilization and able to make turn without LOB. Pt had difficulty with gaze stabilization throughout session. Pt demonstrates bilateral decreased LE clearance with decreased stride length and frequent cueing to remain in proximity to [employer] International GroupW   Stairs             Wheelchair Mobility    Modified Rankin (Stroke Patients Only)       Balance Overall balance assessment: Needs assistance Sitting-balance support: No upper extremity supported;Feet supported Sitting balance-Leahy Scale: Fair     Standing balance support: Bilateral upper extremity supported Standing balance-Leahy Scale: Poor Standing balance comment: dependent on external support from RW                            Cognition Arousal/Alertness: Awake/alert Behavior During Therapy: Impulsive Overall Cognitive Status: Impaired/Different from baseline Area of Impairment: Safety/judgement;Following commands                       Following Commands: Follows one step commands inconsistently Safety/Judgement: Decreased awareness of safety     General Comments: Pt distracted and inconsistently following cues for gaze stabilization. Pt lacks awareness of deficits as she states that "everything will be ok once she gets home and uses the push mower"      Exercises General Exercises - Lower Extremity Long Arc Quad: AROM;10 reps;Both;Seated Hip Flexion/Marching: AROM;10 reps;Both    General Comments General comments (skin integrity, edema, etc.): BP elevated (182/78) but medicated prior to  activity. Saturating well on RA      Pertinent Vitals/Pain Pain Assessment: No/denies pain    Home Living                      Prior Function            PT Goals (current goals can now be found in the care plan section) Progress towards PT goals: Progressing toward goals     Frequency    Min 3X/week      PT Plan Current plan remains appropriate    Co-evaluation              AM-PAC PT "6 Clicks" Daily Activity  Outcome Measure  Difficulty turning over in bed (including adjusting bedclothes, sheets and blankets)?: A Little Difficulty moving from lying on back to sitting on the side of the bed? : A Little Difficulty sitting down on and standing up from a chair with arms (e.g., wheelchair, bedside commode, etc,.)?: A Lot Help needed moving to and from a bed to chair (including a wheelchair)?: A Lot Help needed walking in hospital room?: A Lot Help needed climbing 3-5 steps with a railing? : A Lot 6 Click Score: 14    End of Session Equipment Utilized During Treatment: Gait belt Activity Tolerance: Patient tolerated treatment well;Other (comment)(Dizziness limiting ambulation) Patient left: in bed;with call bell/phone within reach;with family/visitor present;with bed alarm set Nurse Communication: Mobility status PT Visit Diagnosis: Unsteadiness on feet (R26.81);Other abnormalities of gait and mobility (R26.89);Other symptoms and signs involving the nervous system (R29.898)     Time: 0981-1914 PT Time Calculation (min) (ACUTE ONLY): 47 min  Charges:  $Gait Training: 8-22 mins $Therapeutic Activity: 23-37 mins                    G Codes:       Gabe Verenice Westrich, SPT   [employer] 05/07/2018, 12:07 PM

## 2018-05-07 NOTE — Progress Notes (Signed)
STROKE TEAM PROGRESS NOTE   SUBJECTIVE (INTERVAL HISTORY) Therapists and daughter and grand daughters are  at bedside.  Pt complains of tiredness after particpating with therapy    OBJECTIVE Temp:  [97.6 F (36.4 C)-98 F (36.7 C)] 98 F (36.7 C) (06/10 1739) Pulse Rate:  [53-84] 81 (06/10 1739) Cardiac Rhythm: Normal sinus rhythm (06/10 1600) Resp:  [14-26] 20 (06/10 1739) BP: (123-182)/(47-79) 149/79 (06/10 1739) SpO2:  [91 %-96 %] 95 % (06/10 1739) Weight:  [143 lb 4.8 oz (65 kg)] 143 lb 4.8 oz (65 kg) (06/10 1739)  Recent Labs  Lab 05/06/18 1833 05/06/18 2210 05/07/18 0736 05/07/18 1157 05/07/18 1633  GLUCAP 135* 115* 118* 138* 118*   Recent Labs  Lab 05/02/18 0342 05/03/18 0455 05/05/18 0452 05/06/18 0600 05/07/18 0430  NA 136 139 137 139 139  K 5.1 4.2 4.1 3.6 3.2*  CL 112* 110 104 102 103  CO2 19* 25 26 29 28   GLUCOSE 138* 120* 99 93 107*  BUN 22* 15 11 10 9   CREATININE 1.00 0.84 0.82 0.75 0.77  CALCIUM 7.9* 8.2* 7.4* 8.7* 8.3*  MG 2.1 2.0  --   --   --   PHOS 2.7 2.0*  --   --   --    No results for input(s): AST, ALT, ALKPHOS, BILITOT, PROT, ALBUMIN in the last 168 hours. Recent Labs  Lab 05/02/18 0342 05/03/18 0455 05/05/18 0452 05/06/18 0600 05/07/18 0430  WBC 17.5* 12.2* 8.8 8.3 9.6  HGB 11.2* 10.7* 12.3 12.8 12.5  HCT 33.5* 32.4* 36.2 38.7 37.7  MCV 94.9 94.7 95.0 94.4 94.7  PLT 276 274 346 349 358   No results for input(s): CKTOTAL, CKMB, CKMBINDEX, TROPONINI in the last 168 hours. No results for input(s): LABPROT, INR in the last 72 hours. No results for input(s): COLORURINE, LABSPEC, PHURINE, GLUCOSEU, HGBUR, BILIRUBINUR, KETONESUR, PROTEINUR, UROBILINOGEN, NITRITE, LEUKOCYTESUR in the last 72 hours.  Invalid input(s): APPERANCEUR     Component Value Date/Time   CHOL 183 05/02/2018 0342   TRIG 186 (H) 05/02/2018 0342   HDL 48 05/02/2018 0342   CHOLHDL 3.8 05/02/2018 0342   VLDL 37 05/02/2018 0342   LDLCALC 98 05/02/2018 0342    Lab Results  Component Value Date   HGBA1C 5.8 (H) 05/02/2018   No results found for: LABOPIA, COCAINSCRNUR, LABBENZ, AMPHETMU, THCU, LABBARB  No results for input(s): ETH in the last 168 hours.  I have personally reviewed the radiological images below and agree with the radiology interpretations.  Ct Head Wo Contrast  Result Date: 05/03/2018 CLINICAL DATA:  Intracranial hemorrhage follow up EXAM: CT HEAD WITHOUT CONTRAST TECHNIQUE: Contiguous axial images were obtained from the base of the skull through the vertex without intravenous contrast. COMPARISON:  Head CT 05/01/2018, 04/30/2018 FINDINGS: Brain: Status post surgical evacuation of right posterior fossa hematoma. The amount of pneumocephalus has decreased. The fourth ventricle and basal cisterns are patent. No new hemorrhage. No hydrocephalus. The area of hypoattenuation in the right cerebellum has slightly increased in size. There is white matter hypoattenuation of the supratentorial brain consistent with chronic small vessel disease. Old right basal ganglia lacunar infarct is unchanged. Vascular: No abnormal hyperdensity of the major intracranial arteries or dural venous sinuses. No intracranial atherosclerosis. Skull: Status post suboccipital craniectomy. Sinuses/Orbits: No fluid levels or advanced mucosal thickening of the visualized paranasal sinuses. No mastoid or middle ear effusion. The orbits are normal. IMPRESSION: Decreased pneumocephalus and low products in the right posterior fossa status post surgical evacuation  of hematoma. No new abnormality. Electronically Signed   By: Deatra Robinson M.D.   On: 05/03/2018 04:47   Ct Head Wo Contrast  Result Date: 05/01/2018 CLINICAL DATA:  84 year old female with intracranial bleed post surgery with new headache and neck pain. Subsequent encounter. EXAM: CT HEAD WITHOUT CONTRAST TECHNIQUE: Contiguous axial images were obtained from the base of the skull through the vertex without intravenous  contrast. COMPARISON:  04/30/2018 CT. FINDINGS: Brain: Post right occipital craniectomy for evacuation of right cerebellar hematoma. Residual blood collection in the right cerebellum now measures 0.6 x 1.5 x 2.1 cm versus prior 4 x 3.9 x 2.6 cm. Small amount of gas within the resection cavity. Mild surrounding vasogenic edema. Decrease mass effect upon the fourth ventricle. Scattered small areas of pneumocephalus bilaterally greatest frontal region without associated mass effect. No new intracranial hemorrhage or CT evidence of large acute infarct. Remote small left thalamic infarct. Chronic microvascular changes. Vascular: Vascular calcifications Skull: Postsurgical changes occipital region. Sinuses/Orbits: No acute orbital abnormality. Visualized paranasal sinuses are clear. Other: Mastoid air cells and middle ear cavities are clear. IMPRESSION: Post right occipital craniectomy for evacuation of right cerebellar hematoma. Residual blood collection in the right cerebellum now measures 0.6 x 1.5 x 2.1 cm versus prior 4 x 3.9 x 2.6 cm. Small amount of gas within the resection cavity. Mild surrounding vasogenic edema. Decrease mass effect upon the fourth ventricle. Scattered small areas of pneumocephalus bilaterally greatest frontal region without associated mass effect. No new intracranial hemorrhage or CT evidence of large acute infarct. Electronically Signed   By: Lacy Duverney M.D.   On: 05/01/2018 10:28   Ct Head Wo Contrast  Result Date: 04/30/2018 CLINICAL DATA:  84 year old female with altered mental status. Initial encounter. EXAM: CT HEAD WITHOUT CONTRAST TECHNIQUE: Contiguous axial images were obtained from the base of the skull through the vertex without intravenous contrast. COMPARISON:  None. FINDINGS: Brain: 4 x 3.9 x 2.6 cm right cerebellar hemorrhage with surrounding vasogenic edema and local mass effect. Compression of the fourth ventricle. Patient at risk for development of hydrocephalus. Mild  superior displacement of the vermis. Minimal inferior displacement right cerebellar tonsil. No other intracranial mass lesion seen on present unenhanced head CT. Remote left thalamic infarct. Moderate chronic microvascular changes. Mild atrophy. Vascular: Vascular calcifications. Skull: No skull fracture. Sinuses/Orbits: No acute orbital abnormality. Visualized paranasal sinuses are clear. Other: Mastoid air cells and middle ear cavities are clear. IMPRESSION: 4 x 3.9 x 2.6 cm right cerebellar hemorrhage with surrounding vasogenic edema and local mass effect. Compression of the fourth ventricle. Patient at risk for development of hydrocephalus. Mild superior displacement of the vermis. Minimal inferior displacement right cerebellar tonsil. Cerebellar hemorrhage may be related to hypertensive bleed. Underlying lesion not excluded. Remote left thalamic infarct. Moderate chronic microvascular changes. These results were called by telephone at the time of interpretation on 04/30/2018 at 7:30 am to Dr. Ilda Basset who verbally acknowledged these results. Electronically Signed   By: Lacy Duverney M.D.   On: 04/30/2018 07:42   Dg Chest Portable 1 View  Result Date: 04/30/2018 CLINICAL DATA:  Hypoxia EXAM: PORTABLE CHEST 1 VIEW COMPARISON:  None. FINDINGS: Endotracheal tube tip is 1.2 cm above the carina. Nasogastric tube tip and side port are below the diaphragm. No pneumothorax. No edema or consolidation. Heart size and pulmonary vascularity are normal. No adenopathy. No bone lesions. IMPRESSION: Tube positions as described without pneumothorax. Note that the endotracheal tube is near the carina. It may be prudent  to consider withdrawing endotracheal tube 2-3 cm. No edema or consolidation. Heart size normal. There is aortic atherosclerosis. Aortic Atherosclerosis (ICD10-I70.0). Electronically Signed   By: Bretta Bang III M.D.   On: 04/30/2018 08:17   TTE - Left ventricle: The cavity size was normal. There was  moderate   focal basal hypertrophy. Systolic function was normal. The   estimated ejection fraction was in the range of 60% to 65%. Wall   motion was normal; there were no regional wall motion   abnormalities. Features are consistent with a pseudonormal left   ventricular filling pattern, with concomitant abnormal relaxation   and increased filling pressure (grade 2 diastolic dysfunction).   Doppler parameters are consistent with high ventricular filling   pressure. - Aortic valve: Trileaflet; normal thickness, mildly calcified   leaflets. - Mitral valve: Calcified annulus. There was trivial regurgitation. - Left atrium: The atrium was severely dilated. - Right ventricle: The cavity size was mildly dilated. Wall   thickness was normal. - Atrial septum: There was increased thickness of the septum,   consistent with lipomatous hypertrophy. - Tricuspid valve: There was trivial regurgitation. - Pulmonary arteries: Systolic pressure could not be accurately   estimated.   PHYSICAL EXAM  Temp:  [97.6 F (36.4 C)-98 F (36.7 C)] 98 F (36.7 C) (06/10 1739) Pulse Rate:  [53-84] 81 (06/10 1739) Resp:  [14-26] 20 (06/10 1739) BP: (123-182)/(47-79) 149/79 (06/10 1739) SpO2:  [91 %-96 %] 95 % (06/10 1739) Weight:  [143 lb 4.8 oz (65 kg)] 143 lb 4.8 oz (65 kg) (06/10 1739)  General - Well nourished, well developed, not in acute distress  Ophthalmologic - fundi not visualized due to noncooperation.  Cardiovascular - Regular rate and rhythm  Mental Status -  Level of arousal and orientation to time, place, and person were intact. Language including expression, naming, repetition, comprehension was assessed and found intact. Cranial Nerves II - XII - II - Visual field intact OU. III, IV, VI - Extraocular movements intact. V - Facial sensation intact bilaterally. VII - Facial movement intact bilaterally. VIII - Hearing & vestibular intact bilaterally, no   nystagmus. X - Palate elevates  symmetrically. XI - Chin turning & shoulder shrug intact bilaterally. XII - Tongue protrusion intact.  Motor Strength - The patient's strength was symmetrical in all extremities and pronator drift was absent.  Bulk was normal and fasciculations were absent.   Motor Tone - Muscle tone was assessed at the neck and appendages and was normal.  Reflexes - The patient's reflexes were symmetrical in all extremities and she had no pathological reflexes.  Sensory - Light touch, temperature/pinprick were assessed and were symmetrical.    Coordination - The patient has slight  right FTN and HTS ataxia, LUE and LLE intact coordination.  Tremor was absent.  Gait and Station - deferred.  ASSESSMENT/PLAN Tiffany Thornton is a 84 y.o. female with no significant history admitted for vertigo, N/V. No tPA given due to ICH.    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    She has several intracranial aneurysms that are not related to her current presentation.  Risk of rupture is low given their size.  They need to be monitored on yearly basis.       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  Resultant right ataxia, vertigo, anusea and HA  CT head right cerebellar large ICH  CT head repeat - s/p evacuation and mass  effect much improved.   CT repeat stable right cerebellar edema without hydrocephalus  2D Echo EF 60-65%  LDL 98  HgbA1c 5.8  SCDs and subq heparin for VTE prophylaxis   On diet  aspirin 81 mg daily prior to admission, now on No antithrombotic due to ICH  Ongoing aggressive stroke risk factor management  Therapy recommendations:  pending  Disposition:  Pending  Cerebellar edema  CT repeat 05/01/18 - s/p evacuation with improvement of mass effect  CT repeat 05/03/18 stable right cerebellar edema without hydrocephalus  MRI and MRA pending  Na 136 -> 139  D/c salt tablet, clinically  stable  Hypertension Still on cleviprex   Needed labetalol PRN overnight  BP goal < 160  On hydralazine IV PRN  Maximized on amlodipine and lisinopril dose  Added hydralazine 50mg  Q8h  Hyperlipidemia   No statins PTA  LDL 98, goal < 70  Consider statin on discharge  Other Stroke Risk Factors  Advanced age  Other Active Problems  Elevated cre 1.13-> 1.00->0.84  Leukocytosis WBC 17.9-> 17.5->12.2 (afebrile)  Hospital day # 7  This patient is critically ill due to large cerebellar infarct, s/p evacuation, hypertensive emergency and at significant risk of neurological worsening, death form rebleed, ischemic stroke, seizure, cerebral edema, brain herniation. This patient's care requires constant monitoring of vital signs, hemodynamics, respiratory and cardiac monitoring, review of multiple databases, neurological assessment, discussion with family, other specialists and medical decision making of high complexity. I spent 35 minutes of neurocritical care time in the care of this patient.  Transfer to inpatient rehabilitation when bed available. Long discussion with the patient and daughter the bedside and answered questions.  Tiffany Heady, MD Medical Director [facility] Pager: [phone] 05/07/2018 5:48 PM    To contact Stroke Continuity provider, please refer to [email or web address]. After hours, contact General Neurology

## 2018-05-07 NOTE — PMR Pre-admission (Signed)
PMR Admission Coordinator Pre-Admission Assessment  Patient: Tiffany Thornton is an 84 y.o., female MRN: [id number] DOB: Dec 06, 1932 Height: 5' 8.11" (173 cm) Weight: 64.6 kg (142 lb 6.7 oz)              Insurance Information HMO: yes    PPO:      PCP:      IPA:      80/20:      OTHER: medicare advantage plan PRIMARY: [employer] Medicare      Policy#: [id number]      Subscriber: pt CM Name: Gweneth Dimitri      Phone#: [phone]     Fax#: [phone] approved for 7 days with f/u Rebeca Alert Pre-Cert#: [id number]      Employer: retired Char[employer] fundraiser Benefits:  Phone #: [phone]     Name: 05/04/18 Eff. Date: 11/28/2017     Deduct: none      Out of Pocket Max: $4400      Life Max: none CIR: $345 co pay per day days 1 until 5 then insurance covers 100%      SNF: no co pay per day days 1 until 20; $160 co pay per day days 21-48; no co pay days 49 to 100 Outpatient: $40 co pay per visit     Co-Pay: visits per medical neccesity Home Health: 100%      Co-Pay: visits per medical neccesity DME: 80%     Co-Pay: 20% Providers: in network  SECONDARY: none       Medicaid Application Date:       Case Manager:  Disability Application Date:       Case Worker:   Emergency Conservator, museum/gallery Information    Name Relation Home Work [place] Son [phone]  [phone]     Current Medical History  Patient Admitting Diagnosis: fight cerebellar hemorrhage  History of Present Illness:  Tiffany Thornton is an 84 year old female with history of hypothyroid otherwise in good health; who was admitted on 04/30/18 with acute mental status changes with nausea, vomiting and vertigo.  She was noted to have hypertensive emergency with systolic blood pressure greater than 200 and CT of head done revealing large acute right cerebellar hemorrhage with  mass-effect on 4th ventricle and waxing an waning of symptoms. She was evaluated by Dr. Yetta Barre and taken to OR for suboccipital craniectomy and evacuation of  hemorrhage.  Post op with mild dysarthria and right ataxia. 2 D echo showed EF 60-65% with grade 2 diastolic dysfunction and calcified mitral annulus with trivial regurgitation.   Dr. Roda Shutters felt that bleed due to hypertensive emergency and SBP goals < 160.  MRI/MRA brain done revealing stable bleed with acute/subacute nonhemorrhagic infarct lateral to cerebellar hemorrhage, decreasing mass effect and no hydrocephalus and multiple intracranial aneurysms.  Scopolamine patch added to help with vertigo and mobility improving. She continues to have dizziness with activity as well as poor safety awareness.   Total: 0 NIH    Past Medical History  Past Medical History:  Diagnosis Date  . Hyperlipidemia   . Hypothyroid     Family History  family history is not on file.  Prior Rehab/Hospitalizations:  Has the patient had major surgery during 100 days prior to admission? No  Current Medications   Current Facility-Administered Medications:  .  0.9 %  sodium chloride infusion, , Intravenous, Continuous, Pool, Henry, MD, Last Rate: 50 mL/hr at 05/07/18 1200 .  acetaminophen (TYLENOL) tablet 650 mg, 650 mg,  Oral, Q4H PRN, 650 mg at 05/03/18 1317 **OR** acetaminophen (TYLENOL) suppository 650 mg, 650 mg, Rectal, Q4H PRN, Julio Sicks, MD .  amLODipine (NORVASC) tablet 10 mg, 10 mg, Oral, Daily, Pool, Sherilyn Cooter, MD, 10 mg at 05/07/18 1014 .  bisacodyl (DULCOLAX) suppository 10 mg, 10 mg, Rectal, Daily PRN, Julio Sicks, MD .  Chlorhexidine Gluconate Cloth 2 % PADS 6 each, 6 each, Topical, Q0600, Julio Sicks, MD, 6 each at 05/07/18 0524 .  clevidipine (CLEVIPREX) infusion 0.5 mg/mL, 0-21 mg/hr, Intravenous, Continuous, Julio Sicks, MD, Stopped at 05/04/18 1400 .  docusate sodium (COLACE) capsule 100 mg, 100 mg, Oral, BID, Pool, Sherilyn Cooter, MD, 100 mg at 05/07/18 1014 .  heparin injection 5,000 Units, 5,000 Units, Subcutaneous, Q12H, Pool, Sherilyn Cooter, MD, 5,000 Units at 05/07/18 1014 .  hydrALAZINE (APRESOLINE) injection  5-10 mg, 5-10 mg, Intravenous, Q4H PRN, Julio Sicks, MD, 5 mg at 05/07/18 1036 .  hydrALAZINE (APRESOLINE) tablet 50 mg, 50 mg, Oral, Q8H, Pool, Sherilyn Cooter, MD, 50 mg at 05/07/18 0511 .  HYDROcodone-acetaminophen (NORCO/VICODIN) 5-325 MG per tablet 1-2 tablet, 1-2 tablet, Oral, Q4H PRN, Julio Sicks, MD, 1 tablet at 05/04/18 2027 .  HYDROmorphone (DILAUDID) injection 0.5 mg, 0.5 mg, Intravenous, Q2H PRN, Julio Sicks, MD, 0.5 mg at 05/07/18 0512 .  insulin aspart (novoLOG) injection 0-9 Units, 0-9 Units, Subcutaneous, TID WC, Julio Sicks, MD, 1 Units at 05/07/18 1312 .  levothyroxine (SYNTHROID, LEVOTHROID) tablet 100 mcg, 100 mcg, Oral, QAC breakfast, Julio Sicks, MD, 100 mcg at 05/07/18 1014 .  lisinopril (PRINIVIL,ZESTRIL) tablet 20 mg, 20 mg, Oral, BID, Julio Sicks, MD, 20 mg at 05/07/18 1014 .  meclizine (ANTIVERT) tablet 12.5 mg, 12.5 mg, Oral, TID PRN, Julio Sicks, MD, 12.5 mg at 05/07/18 0429 .  multivitamin with minerals tablet 1 tablet, 1 tablet, Oral, Daily, Pool, Sherilyn Cooter, MD, 1 tablet at 05/07/18 1014 .  naloxone (NARCAN) injection 0.08 mg, 0.08 mg, Intravenous, PRN, Julio Sicks, MD .  ondansetron (ZOFRAN) tablet 4 mg, 4 mg, Oral, Q4H PRN **OR** ondansetron (ZOFRAN) injection 4 mg, 4 mg, Intravenous, Q4H PRN, Julio Sicks, MD, 4 mg at 05/06/18 1922 .  promethazine (PHENERGAN) tablet 12.5-25 mg, 12.5-25 mg, Oral, Q4H PRN, Julio Sicks, MD, 25 mg at 05/04/18 2030 .  scopolamine (TRANSDERM-SCOP) 1 MG/3DAYS 1.5 mg, 1 patch, Transdermal, Q72H, Nyanor, Pearl O, NP, 1.5 mg at 05/06/18 1345  Patients Current Diet:  Diet Order           Diet Heart Room service appropriate? Yes; Fluid consistency: Thin  Diet effective now          Precautions / Restrictions Precautions Precautions: Fall Precaution Comments: Dizziness with activity, keep Systolic BP<160 Restrictions Weight Bearing Restrictions: No   Has the patient had 2 or more falls or a fall with injury in the past year?No  Prior Activity  Level Community (5-7x/wk): very active and independdent; retired Charity fundraiser; Programmer, multimedia / Equipment Home Assistive Devices/Equipment: None Home Equipment: None  Prior Device Use: Indicate devices/aids used by the patient prior to current illness, exacerbation or injury? None of the above  Prior Functional Level Prior Function Level of Independence: Independent Comments: 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, former Charity fundraiser at [employer]  Self Care: Did the patient need help bathing, dressing, using the toilet or eating?  Independent  Indoor Mobility: Did the patient need assistance with walking from room to room (with or without device)? Independent  Stairs: Did the patient need assistance with internal or external stairs (  with or without device)? Independent  Functional Cognition: Did the patient need help planning regular tasks such as shopping or remembering to take medications? Independent  Current Functional Level Cognition  Overall Cognitive Status: Impaired/Different from baseline Orientation Level: Oriented X4 Following Commands: Follows one step commands inconsistently Safety/Judgement: Decreased awareness of safety General Comments: Pt distracted and inconsistently following cues for gaze stabilization. Pt lacks awareness of deficits as she states that "everything will be ok once she gets home and uses the push mower"    Extremity Assessment (includes Sensation/Coordination)  Upper Extremity Assessment: Defer to OT evaluation  Lower Extremity Assessment: Overall WFL for tasks assessed, RLE deficits/detail, LLE deficits/detail RLE Coordination: decreased fine motor LLE Sensation: decreased light touch LLE Coordination: decreased fine motor    ADLs  Overall ADL's : Needs assistance/impaired Eating/Feeding: Set up, Bed level Eating/Feeding Details (indicate cue type and reason): Pt able to bring hand to mouth Grooming: Moderate assistance, Sitting Grooming Details  (indicate cue type and reason): supported sitting Upper Body Bathing: Moderate assistance Lower Body Bathing: Moderate assistance, Sitting/lateral leans Lower Body Bathing Details (indicate cue type and reason): Pt able to bring LLE cross to knees with mod A for sitting balance Upper Body Dressing : Moderate assistance Lower Body Dressing: Minimal assistance, +2 for safety/equipment, +2 for physical assistance, Sit to/from stand Toilet Transfer: Minimal assistance, +2 for physical assistance, +2 for safety/equipment, RW(sit to stand only this session) Toilet Transfer Details (indicate cue type and reason): strong posterior lean with face to face, much safer and better balance with RW - vc for safe hand placement Toileting- Clothing Manipulation and Hygiene: Maximal assistance, Sit to/from stand General ADL Comments: limited by dizziness/nausea throughout assessment; trouble keeping eyes open at this time throughout session despite cues    Mobility  Overal bed mobility: Needs Assistance Bed Mobility: Sit to Supine Supine to sit: Mod assist Sit to supine: Min guard General bed mobility comments: Min guard for safety. Pt dizzy with movement    Transfers  Overall transfer level: Needs assistance Equipment used: Standard walker Transfers: Sit to/from Stand Sit to Stand: Min guard, Min assist General transfer comment: Cues for hand placement. Min A on trial 1, min guard on trial 2 with increased time required.    Ambulation / Gait / Stairs / Wheelchair Mobility  Ambulation/Gait Ambulation/Gait assistance: Mod assist, Min assist Ambulation Distance (Feet): 20 Feet Assistive device: Rolling walker (2 wheeled) Gait Pattern/deviations: Step-through pattern, Decreased stride length General Gait Details: 2 ambulation trials each ~20 ft. Pt c/o of dizziness and asked to sit. Pt min A for majority of ambulation with mod A required for LOB during turn and L lean when dizzy. Later in session, pt  improved with gaze stabilization and able to make turn without LOB. Pt had difficulty with gaze stabilization throughout session. Pt demonstrates bilateral decreased LE clearance with decreased stride length and frequent cueing to remain in proximity to RW Gait velocity: slow Gait velocity interpretation: <1.31 ft/sec, indicative of household ambulator    Posture / Balance Dynamic Sitting Balance Sitting balance - Comments: requiring external balance support Balance Overall balance assessment: Needs assistance Sitting-balance support: No upper extremity supported, Feet supported Sitting balance-Leahy Scale: Fair Sitting balance - Comments: requiring external balance support Postural control: Posterior lean Standing balance support: Bilateral upper extremity supported Standing balance-Leahy Scale: Poor Standing balance comment: dependent on external support from RW    Special needs/care consideration BiPAP/CPAP  N/a CPM  N/a Continuous Drip IV  N/a Dialysis  N/a Life Vest  N/a Oxygen n/a Special Bed  N/a Trach Size n/a Wound Vac n/a Skin surgical incision to head; ecchymosis anterior to Bil LE and UE Bowel mgmt: no LBM documented Bladder mgmt: continent Diabetic mgmt  N/a Continued with nausea and vomiting     Previous Home Environment Living Arrangements: Alone  Lives With: Alone Available Help at Discharge: Family, Available 24 hours/day Type of Home: House Home Layout: One level Home Access: Stairs to enter Entrance Stairs-Rails: None Secretary/administratorntrance Stairs-Number of Steps: 1 Bathroom Shower/Tub: Health visitorWalk-in shower Bathroom Toilet: Pharmacist, communitytandard Bathroom Accessibility: Yes How Accessible: Accessible via walker Home Care Services: No  Discharge Living Setting Plans for Discharge Living Setting: Patient's home, Alone Type of Home at Discharge: House Discharge Home Layout: One level Discharge Home Access: Stairs to enter Entrance Stairs-Rails: None Entrance Stairs-Number of  Steps: 1 Discharge Bathroom Shower/Tub: Garment/textile technologistWalk-in shower Discharge Bathroom Toilet: Standard Discharge Bathroom Accessibility: Yes How Accessible: Accessible via walker Does the patient have any problems obtaining your medications?: No  Social/Family/Support Systems Patient Roles: Parent Contact Information: [name], son and POA, main contact Anticipated Caregiver: family Anticipated Caregiver's Contact Information: see above Ability/Limitations of Caregiver: family work and can will arrange family support 24/7 initially Caregiver Availability: 24/7 Discharge Plan Discussed with Primary Caregiver: Yes Is Caregiver In Agreement with Plan?: Yes Does Caregiver/Family have Issues with Lodging/Transportation while Pt is in Rehab?: No   Goals/Additional Needs Patient/Family Goal for Rehab: supervision wiht PT, OT and SLP Expected length of stay: [facility]OS 12 to 18 days Special Service Needs: dizziness and nausea persist Pt/Family Agrees to Admission and willing to participate: Yes Program Orientation Provided & Reviewed with Pt/Caregiver Including Roles  & Responsibilities: Yes  Decrease burden of Care through IP rehab admission:  N/a  Possible need for SNF placement upon discharge: not anticipated  Patient Condition: This patient's medical and functional status has changed since the consult dated: 05/03/2018 in which the Rehabilitation Physician determined and documented that the patient's condition is appropriate for intensive rehabilitative care in an inpatient rehabilitation facility. See "History of Present Illness" (above) for medical update. Functional changes are: overall mod assist. Patient's medical and functional status update has been discussed with the Rehabilitation physician and patient remains appropriate for inpatient rehabilitation. Will admit to inpatient rehab today.  Preadmission Screen Completed By:  Clois DupesBoyette, Amado Andal Godwin, 05/07/2018 1:32  PM ______________________________________________________________________   Discussed status with Dr. Allena KatzPatel on 05/07/2018 at  1352 and received telephone approval for admission today.  Admission Coordinator:  Clois DupesBoyette, Tiyana Galla Godwin, time [id number] Date 05/07/2018

## 2018-05-07 NOTE — Progress Notes (Signed)
Tiffany Oyster, MD  Physician  Physical Medicine and Rehabilitation  Consult Note  Signed  Date of Service:  05/03/2018 5:53 AM       Related encounter: ED to Hosp-Admission (Discharged) from 04/30/2018 in [facility]      Signed      Expand All Collapse All      Show:Clear all [x] Manual[x] Template[] Copied  Added by: [x] Angiulli, Mcarthur Rossetti, PA-C[x] Tiffany Oyster, MD   [] Hover for details        Physical Medicine and Rehabilitation Consult Reason for Consult: Decreased functional mobility Referring Physician: Dr. Jordan Likes   HPI: Tiffany Thornton is a 84 y.o. right-handed female with history of hypothyroidism.  Presented 04/30/2018 with decreased level of consciousness with associated headache as well as nausea vomiting.  Per chart review patient lives alone.  Independent and active.  She mows 2 acres of grass with a push mower.  She is a former Charity fundraiser at [employer].  One level home with one-step to entry.  Family plans to arrange assistance as needed.  Cranial CT scan showed a 4 x 3.9 x 2.6 cm right cerebellar hemorrhage with surrounding vasogenic edema and local mass-effect.  Compression of the fourth ventricle as well as remote left thalamic infarction.  Cerebellar hemorrhage felt to be related to hypertensive crisis.  Underwent emergent suboccipital craniectomy with evacuation of hematoma 04/30/2018 per Dr. Jordan Likes.  Maintained on Cleviprex for blood pressure control.  Latest cranial CT scan with no new abnormalities.  Subcutaneous heparin later added for DVT prophylaxis.  Tolerating a regular diet.  Physical therapy evaluation completed with recommendations of physical medicine rehab consult.   Review of Systems  Constitutional: Negative for chills and fever.  HENT: Negative for hearing loss.   Eyes: Negative for blurred vision and double vision.  Respiratory: Negative for cough.   Cardiovascular: Negative for chest pain, palpitations and  leg swelling.  Gastrointestinal: Positive for nausea and vomiting.  Genitourinary: Negative for dysuria and hematuria.  Musculoskeletal: Positive for myalgias.  Skin: Negative for rash.  Neurological: Positive for headaches. Negative for seizures.  All other systems reviewed and are negative.  History reviewed. No pertinent past medical history.  The histories are not reviewed yet. Please review them in the "History" navigator section and refresh this SmartLink. No family history on file. Social History:  has no tobacco, alcohol, and drug history on file. Allergies: No Known Allergies       Medications Prior to Admission  Medication Sig Dispense Refill  . aspirin EC 81 MG tablet Take 81 mg by mouth daily.    Marland Kitchen levothyroxine (SYNTHROID, LEVOTHROID) 100 MCG tablet Take 100 mcg by mouth daily before breakfast.    . Multiple Vitamin (MULTIVITAMIN WITH MINERALS) TABS tablet Take 1 tablet by mouth daily.      Home: Home Living Family/patient expects to be discharged to:: Private residence Living Arrangements: Alone Available Help at Discharge: Family, Available 24 hours/day Type of Home: House Home Access: Stairs to enter Secretary/administrator of Steps: 1 Entrance Stairs-Rails: None Home Layout: One level Bathroom Shower/Tub: Health visitor: Standard Home Equipment: None  Functional History: Prior Function Level of Independence: Independent Comments: 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, former Charity fundraiser at [employer] Functional Status:  Mobility: Bed Mobility Overal bed mobility: Needs Assistance Bed Mobility: Supine to Sit, Sit to Supine Supine to sit: Mod assist(vc for sequencing, assist for scooting hips EOB) Sit to supine: Mod assist General bed mobility  comments: Pt constantly complaining of dizziness throughout session Transfers Overall transfer level: Needs assistance Equipment used: 1 person hand held assist Transfers: Sit to/from Stand, Stand Pivot  Transfers Sit to Stand: +2 safety/equipment, Mod assist General transfer comment: used face to face assist.  pt's knee were guarded due to patient unable to coordinate stepping/pivoting to the chair.  ADL: ADL Overall ADL's : Needs assistance/impaired Eating/Feeding: Set up, Bed level Eating/Feeding Details (indicate cue type and reason): Pt able to bring hand to mouth Grooming: Moderate assistance, Sitting Grooming Details (indicate cue type and reason): supported sitting Upper Body Bathing: Moderate assistance Lower Body Bathing: Moderate assistance, Sitting/lateral leans Lower Body Bathing Details (indicate cue type and reason): Pt able to bring LLE cross to knees with mod A for sitting balance Upper Body Dressing : Moderate assistance Lower Body Dressing: Minimal assistance, +2 for safety/equipment, +2 for physical assistance, Sit to/from stand Toilet Transfer: Minimal assistance, +2 for physical assistance, +2 for safety/equipment, RW(sit to stand only this session) Toilet Transfer Details (indicate cue type and reason): strong posterior lean with face to face, much safer and better balance with RW - vc for safe hand placement Toileting- Clothing Manipulation and Hygiene: Maximal assistance, Sit to/from stand General ADL Comments: limited by dizziness/nausea throughout assessment; trouble keeping eyes open at this time throughout session despite cues  Cognition: Cognition Overall Cognitive Status: Impaired/Different from baseline Orientation Level: Oriented X4 Cognition Arousal/Alertness: Awake/alert(but trouble keeping eyes open) Behavior During Therapy: Anxious, Flat affect Overall Cognitive Status: Impaired/Different from baseline Area of Impairment: Problem solving, Safety/judgement, Following commands Following Commands: Follows one step commands with increased time, Follows one step commands consistently Safety/Judgement: Decreased awareness of deficits Problem Solving:  Decreased initiation, Requires verbal cues  Blood pressure (!) 140/54, pulse (!) 52, temperature 97.8 F (36.6 C), temperature source Oral, resp. rate 15, height 5' 8.11" (1.73 m), weight 61.2 kg (135 lb), SpO2 91 %. Physical Exam  Vitals reviewed. Constitutional: She is oriented to person, place, and time. She appears well-developed. She appears distressed.  HENT:  Head: Normocephalic.  Eyes: No scleral icterus.  Neck: Neck supple.  Cardiovascular:  Tachy   Respiratory: Effort normal.  GI: Soft.  Neurological: She is alert and oriented to person, place, and time.  Had difficult time keeping eyes open due to precipitating nausea and vertigo. Moves all 4 limbs while in bed. Senses pain and LT in all 4  Skin: Skin is warm.  Cranial incision intact  Psychiatric: She has a normal mood and affect. Her behavior is normal.    LabResultsLast24Hours  Results for orders placed or performed during the hospital encounter of 04/30/18 (from the past 24 hour(s))  Glucose, capillary     Status: Abnormal   Collection Time: 05/02/18 11:43 AM  Result Value Ref Range   Glucose-Capillary 123 (H) 65 - 99 mg/dL   Comment 1 Notify RN    Comment 2 Document in Chart   Glucose, capillary     Status: Abnormal   Collection Time: 05/02/18  5:02 PM  Result Value Ref Range   Glucose-Capillary 117 (H) 65 - 99 mg/dL   Comment 1 Notify RN    Comment 2 Document in Chart   Glucose, capillary     Status: Abnormal   Collection Time: 05/02/18  9:46 PM  Result Value Ref Range   Glucose-Capillary 117 (H) 65 - 99 mg/dL  CBC     Status: Abnormal   Collection Time: 05/03/18  4:55 AM  Result Value Ref Range  WBC 12.2 (H) 4.0 - 10.5 K/uL   RBC 3.42 (L) 3.87 - 5.11 MIL/uL   Hemoglobin 10.7 (L) 12.0 - 15.0 g/dL   HCT 19.632.4 (L) 22.236.0 - 97.946.0 %   MCV 94.7 78.0 - 100.0 fL   MCH 31.3 26.0 - 34.0 pg   MCHC 33.0 30.0 - 36.0 g/dL   RDW 89.213.9 11.911.5 - 41.715.5 %   Platelets 274 150 - 400 K/uL       ImagingResults(Last48hours)  Ct Head Wo Contrast  Result Date: 05/03/2018 CLINICAL DATA:  Intracranial hemorrhage follow up EXAM: CT HEAD WITHOUT CONTRAST TECHNIQUE: Contiguous axial images were obtained from the base of the skull through the vertex without intravenous contrast. COMPARISON:  Head CT 05/01/2018, 04/30/2018 FINDINGS: Brain: Status post surgical evacuation of right posterior fossa hematoma. The amount of pneumocephalus has decreased. The fourth ventricle and basal cisterns are patent. No new hemorrhage. No hydrocephalus. The area of hypoattenuation in the right cerebellum has slightly increased in size. There is white matter hypoattenuation of the supratentorial brain consistent with chronic small vessel disease. Old right basal ganglia lacunar infarct is unchanged. Vascular: No abnormal hyperdensity of the major intracranial arteries or dural venous sinuses. No intracranial atherosclerosis. Skull: Status post suboccipital craniectomy. Sinuses/Orbits: No fluid levels or advanced mucosal thickening of the visualized paranasal sinuses. No mastoid or middle ear effusion. The orbits are normal. IMPRESSION: Decreased pneumocephalus and low products in the right posterior fossa status post surgical evacuation of hematoma. No new abnormality. Electronically Signed   By: Deatra RobinsonKevin  Herman M.D.   On: 05/03/2018 04:47   Ct Head Wo Contrast  Result Date: 05/01/2018 CLINICAL DATA:  84 year old female with intracranial bleed post surgery with new headache and neck pain. Subsequent encounter. EXAM: CT HEAD WITHOUT CONTRAST TECHNIQUE: Contiguous axial images were obtained from the base of the skull through the vertex without intravenous contrast. COMPARISON:  04/30/2018 CT. FINDINGS: Brain: Post right occipital craniectomy for evacuation of right cerebellar hematoma. Residual blood collection in the right cerebellum now measures 0.6 x 1.5 x 2.1 cm versus prior 4 x 3.9 x 2.6 cm. Small amount of gas  within the resection cavity. Mild surrounding vasogenic edema. Decrease mass effect upon the fourth ventricle. Scattered small areas of pneumocephalus bilaterally greatest frontal region without associated mass effect. No new intracranial hemorrhage or CT evidence of large acute infarct. Remote small left thalamic infarct. Chronic microvascular changes. Vascular: Vascular calcifications Skull: Postsurgical changes occipital region. Sinuses/Orbits: No acute orbital abnormality. Visualized paranasal sinuses are clear. Other: Mastoid air cells and middle ear cavities are clear. IMPRESSION: Post right occipital craniectomy for evacuation of right cerebellar hematoma. Residual blood collection in the right cerebellum now measures 0.6 x 1.5 x 2.1 cm versus prior 4 x 3.9 x 2.6 cm. Small amount of gas within the resection cavity. Mild surrounding vasogenic edema. Decrease mass effect upon the fourth ventricle. Scattered small areas of pneumocephalus bilaterally greatest frontal region without associated mass effect. No new intracranial hemorrhage or CT evidence of large acute infarct. Electronically Signed   By: Lacy DuverneySteven  Olson M.D.   On: 05/01/2018 10:28      Assessment/Plan: Diagnosis: right cerebellar hemorrhage s/p suboccipital craniectomy 1. Does the need for close, 24 hr/day medical supervision in concert with the patient's rehab needs make it unreasonable for this patient to be served in a less intensive setting? Yes 2. Co-Morbidities requiring supervision/potential complications: htn, vestibular symptom mgt 3. Due to bladder management, bowel management, safety, skin/wound care, disease management, medication administration, pain  management and patient education, does the patient require 24 hr/day rehab nursing? Yes 4. Does the patient require coordinated care of a physician, rehab nurse, PT (1-2 hrs/day, 5 days/week), OT (1-2 hrs/day, 5 days/week) and SLP (potentially 1-2 hrs/day, potentially 5 days/week)  to address physical and functional deficits in the context of the above medical diagnosis(es)? Yes Addressing deficits in the following areas: balance, endurance, locomotion, strength, transferring, bowel/bladder control, bathing, dressing, feeding, grooming, toileting, speech, swallowing and psychosocial support 5. Can the patient actively participate in an intensive therapy program of at least 3 hrs of therapy per day at least 5 days per week? Yes 6. The potential for patient to make measurable gains while on inpatient rehab is excellent 7. Anticipated functional outcomes upon discharge from inpatient rehab are supervision  with PT, supervision with OT, modified independent with SLP. 8. Estimated rehab length of stay to reach the above functional goals is: potentially 12-18 days 9. Anticipated D/C setting: Home 10. Anticipated post D/C treatments: HH therapy 11. Overall Rehab/Functional Prognosis: excellent and good  RECOMMENDATIONS: This patient's condition is appropriate for continued rehabilitative care in the following setting: CIR Patient has agreed to participate in recommended program. Yes Note that insurance prior authorization may be required for reimbursement for recommended care.  Comment: Rehab Admissions Coordinator to follow up.  Thanks,  Tiffany Oyster, MD, [place] [facility]  I have personally performed a face to face diagnostic evaluation of this patient. Additionally, I have reviewed and concur with the physician assistant's documentation above.     Mcarthur Rossetti Angiulli, PA-C 05/03/2018          Revision History                        Routing History

## 2018-05-07 NOTE — H&P (Signed)
Physical Medicine and Rehabilitation Admission H&P    Chief Complaint  Patient presents with  . Cerebellar bleed with functional decline.     HPI: Tiffany Thornton is an 84 year old female with history of hypothyroid otherwise in good health; who was admitted on 04/30/18 with acute mental status changes with nausea, vomiting and vertigo.  History taken from chart review. She was noted to have hypertensive emergency with systolic blood pressure greater than 200 and CT of head done, reviewed, showing right cerebellar hemorrhage. Per report, large acute right cerebellar hemorrhage with  mass-effect on 4th ventricle and waxing an waning of symptoms. She was evaluated by Dr. Ronnald Ramp and taken to OR for suboccipital craniectomy and evacuation of hemorrhage.  Post op with mild dysarthria and right ataxia. 2 D echo showed EF 60-65% with grade 2 diastolic dysfunction and calcified mitral annulus with trivial regurgitation.   Dr. Erlinda Hong felt that bleed due to hypertensive emergency and SBP goals < 160.  MRI/MRA brain done revealing stable bleed with acute/subacute nonhemorrhagic infarct lateral to cerebellar hemorrhage, decreasing mass effect and no hydrocephalus and multiple intracranial aneurysms.  Scopolamine patch added to help with vertigo and mobility improving. She continues to have dizziness with activity as well as poor safety awareness. CIR recommended due to functional deficits.     Review of Systems  Constitutional: Negative for chills and fever.  HENT: Negative for hearing loss and tinnitus.   Eyes: Positive for double vision (to right field).  Respiratory: Negative for cough and shortness of breath.   Cardiovascular: Negative for chest pain, palpitations and leg swelling.  Gastrointestinal: Positive for constipation and nausea. Negative for heartburn.  Genitourinary: Negative for dysuria and urgency.  Musculoskeletal: Negative for myalgias.  Skin: Negative for itching and rash.  Neurological:  Positive for dizziness (at rest). Negative for focal weakness, weakness and headaches.  Psychiatric/Behavioral: Negative for depression. The patient is not nervous/anxious and does not have insomnia.   All other systems reviewed and are negative.     Past Medical History:  Diagnosis Date  . Hyperlipidemia   . Hypothyroid      Family History  Problem Relation Age of Onset  . Diabetes Mother   . Lung cancer Father   . Diabetes Sister      Social History:  Widowed. 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 Independent PTA.  She occasionally smokes 3 cigarettes per day. She does not alcohol or illicit drugs.     Allergies: No Known Allergies    Medications Prior to Admission  Medication Sig Dispense Refill  . aspirin EC 81 MG tablet Take 81 mg by mouth daily.    Marland Kitchen levothyroxine (SYNTHROID, LEVOTHROID) 100 MCG tablet Take 100 mcg by mouth daily before breakfast.    . Multiple Vitamin (MULTIVITAMIN WITH MINERALS) TABS tablet Take 1 tablet by mouth daily.      Drug Regimen Review  Drug regimen was reviewed and remains appropriate with no significant issues identified  Home: Home Living Family/patient expects to be discharged to:: Private residence Living Arrangements: Alone Available Help at Discharge: Family, Available 24 hours/day Type of Home: House Home Access: Stairs to enter [employer] of Steps: 1 Entrance Stairs-Rails: None Home Layout: One level Bathroom Shower/Tub: Multimedia programmer: Programmer, systems: Yes Home Equipment: None  Lives With: Alone   Functional History: Prior Function Level of Independence: Independent Comments: 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 Pt dizzy with  movement Transfers Overall transfer level: Needs assistance Equipment used: Standard walker Transfers: Sit to/from Stand Sit to Stand: Min guard, Min assist General transfer comment: Cues for hand placement. Min A on trial 1, min guard on trial 2 with increased time required. Ambulation/Gait Ambulation/Gait assistance: Mod assist, Min assist Ambulation Distance (Feet): 20 Feet Assistive device: Rolling walker (2 wheeled) Gait Pattern/deviations: Step-through pattern, Decreased stride length General Gait Details: 2 ambulation trials each ~20 ft. Pt c/o of dizziness and asked to sit. Pt min A for majority of ambulation with mod A required for LOB during turn and L lean when dizzy. Later in session, pt improved with gaze stabilization and able to make turn without LOB. Pt had difficulty with gaze stabilization throughout session. Pt demonstrates bilateral decreased LE clearance with decreased stride length and frequent cueing to remain in proximity to RW Gait velocity: slow Gait velocity interpretation: <1.31 ft/sec, indicative of household ambulator    ADL: ADL Overall ADL's : Needs assistance/impaired Eating/Feeding: Set up, Bed level Eating/Feeding Details (indicate cue type and reason): Pt able to bring hand to mouth Grooming: Moderate assistance, Sitting Grooming Details (indicate cue type and reason): supported sitting Upper Body Bathing: Moderate assistance Lower Body Bathing: Moderate assistance, Sitting/lateral leans Lower Body Bathing Details (indicate cue type and reason): Pt able to bring LLE cross to knees with mod A for sitting balance Upper Body Dressing : Moderate assistance Lower Body Dressing: Minimal assistance, +2 for safety/equipment, +2 for physical assistance, Sit to/from stand Toilet Transfer: Minimal assistance, +2 for physical assistance, +2 for safety/equipment, RW(sit to stand only this session) Toilet Transfer Details (indicate cue type and reason): strong  posterior lean with face to face, much safer and better balance with RW - vc for safe hand placement Toileting- Clothing Manipulation and Hygiene: Maximal assistance, Sit to/from stand General ADL Comments: limited by dizziness/nausea throughout assessment; trouble keeping eyes open at this time throughout session despite cues  Cognition: Cognition Overall Cognitive Status: Impaired/Different from baseline Orientation Level: Oriented X4 Cognition Arousal/Alertness: Awake/alert Behavior During Therapy: Impulsive Overall Cognitive Status: Impaired/Different from baseline Area of Impairment: Safety/judgement, Following commands Following Commands: Follows one step commands inconsistently Safety/Judgement: Decreased awareness of safety Problem Solving: Decreased initiation, Requires verbal cues General Comments: Pt distracted and inconsistently following cues for gaze stabilization. Pt lacks awareness of deficits as she states that "everything will be ok once she gets home and uses the push mower"   Blood pressure (!) 164/65, pulse 65, temperature 98 F (36.7 C), temperature source Oral, resp. rate (!) 26, height 5' 8.11" (1.73 m), weight 64.6 kg (142 lb 6.7 oz), SpO2 93 %. Physical Exam  Nursing note and vitals reviewed. Constitutional: She appears well-developed and well-nourished.  Intermittent hiccups noted  HENT:  Occiput incision C/D/I with suture in place.   Eyes: EOM are normal. Right eye exhibits no discharge. Left eye exhibits no discharge.  Neck: Normal range of motion. Neck supple.  Cardiovascular: Normal rate and regular rhythm.  Respiratory: Effort normal and breath sounds normal.  GI: Soft. Bowel sounds are normal.  Musculoskeletal:  No edema or tenderness in extremities  Neurological: She is alert.  Speech clear.  Verbose/tangential with perseveration and needs redirection.  Has poor insight/awareness of deficits.  She was able to follow able basic command without  difficulty.  Motor: Right upper extremity: 4-4 +/5 proximal to distal with dysmetria and ataxia ( Left upper extremity: 4+/5 proximal to distal Right lower extremity: 4-4 +/5 proximal to distal Left lower extremity: 4+/5 proximal to distal Sensation intact light touch Mild dysarthria  Skin:  BUE and bilateral shins with multiple ecchymotic areas and evidence of healed skin tears.   Psychiatric:  See above    Results for orders placed or performed during the hospital encounter of 04/30/18 (from the past 48 hour(s))  Glucose, capillary     Status: Abnormal   Collection Time: 05/05/18  4:54 PM  Result Value Ref Range   Glucose-Capillary 111 (H) 65 - 99 mg/dL  Glucose, capillary     Status: Abnormal   Collection Time: 05/05/18  9:31 PM  Result Value Ref Range   Glucose-Capillary 100 (H) 65 - 99 mg/dL  CBC     Status: None   Collection Time: 05/06/18  6:00 AM  Result Value Ref Range   WBC 8.3 4.0 - 10.5 K/uL   RBC 4.10 3.87 - 5.11 MIL/uL   Hemoglobin 12.8 12.0 - 15.0 g/dL   HCT 38.7 36.0 - 46.0 %   MCV 94.4 78.0 - 100.0 fL   MCH 31.2 26.0 - 34.0 pg   MCHC 33.1 30.0 - 36.0 g/dL   RDW 13.5 11.5 - 15.5 %   Platelets 349 150 - 400 K/uL    Comment: Performed at [facility], [place] [address] [place], [place] [address]  Basic metabolic panel     Status: Abnormal   Collection Time: 05/06/18  6:00 AM  Result Value Ref Range   Sodium 139 135 - 145 mmol/L   Potassium 3.6 3.5 - 5.1 mmol/L   Chloride 102 101 - 111 mmol/L   CO2 29 22 - 32 mmol/L   Glucose, Bld 93 65 - 99 mg/dL   BUN 10 6 - 20 mg/dL   Creatinine, Ser 0.75 0.44 - 1.00 mg/dL   Calcium 8.7 (L) 8.9 - 10.3 mg/dL   GFR calc non Af Amer >60 >60 mL/min   GFR calc Af Amer >60 >60 mL/min    Comment: (NOTE) The eGFR has been calculated using the CKD EPI equation. This calculation has not been validated in all clinical situations. eGFR's persistently <60 mL/min signify possible Chronic Kidney Disease.    Anion gap 8 5 -  15    Comment: Performed at [facility] [address] [place], [place] [address]  Glucose, capillary     Status: Abnormal   Collection Time: 05/06/18  8:39 AM  Result Value Ref Range   Glucose-Capillary 167 (H) 65 - 99 mg/dL  Glucose, capillary     Status: Abnormal   Collection Time: 05/06/18 12:45 PM  Result Value Ref Range   Glucose-Capillary 121 (H) 65 - 99 mg/dL  Glucose, capillary     Status: Abnormal   Collection Time: 05/06/18  6:33 PM  Result Value Ref Range   Glucose-Capillary 135 (H) 65 - 99 mg/dL  Glucose, capillary     Status: Abnormal   Collection Time: 05/06/18 10:10 PM  Result Value Ref Range   Glucose-Capillary 115 (H) 65 - 99 mg/dL  CBC     Status: None   Collection Time: 05/07/18  4:30 AM  Result Value Ref Range   WBC 9.6 4.0 - 10.5 K/uL   RBC 3.98 3.87 - 5.11 MIL/uL   Hemoglobin 12.5 12.0 - 15.0 g/dL   HCT 37.7 36.0 -  46.0 %   MCV 94.7 78.0 - 100.0 fL   MCH 31.4 26.0 - 34.0 pg   MCHC 33.2 30.0 - 36.0 g/dL   RDW 13.5 11.5 - 15.5 %   Platelets 358 150 - 400 K/uL    Comment: Performed at [facility], [place] [address], [place], [place] [address]  Basic metabolic panel     Status: Abnormal   Collection Time: 05/07/18  4:30 AM  Result Value Ref Range   Sodium 139 135 - 145 mmol/L   Potassium 3.2 (L) 3.5 - 5.1 mmol/L   Chloride 103 101 - 111 mmol/L   CO2 28 22 - 32 mmol/L   Glucose, Bld 107 (H) 65 - 99 mg/dL   BUN 9 6 - 20 mg/dL   Creatinine, Ser 0.77 0.44 - 1.00 mg/dL   Calcium 8.3 (L) 8.9 - 10.3 mg/dL   GFR calc non Af Amer >60 >60 mL/min   GFR calc Af Amer >60 >60 mL/min    Comment: (NOTE) The eGFR has been calculated using the CKD EPI equation. This calculation has not been validated in all clinical situations. eGFR's persistently <60 mL/min signify possible Chronic Kidney Disease.    Anion gap 8 5 - 15    Comment: Performed at [facility] [address], [place], [place] [address]  Glucose, capillary     Status: Abnormal    Collection Time: 05/07/18  7:36 AM  Result Value Ref Range   Glucose-Capillary 118 (H) 65 - 99 mg/dL  Glucose, capillary     Status: Abnormal   Collection Time: 05/07/18 11:57 AM  Result Value Ref Range   Glucose-Capillary 138 (H) 65 - 99 mg/dL   Mr [facility] Wo Contrast  Result Date: 05/05/2018 CLINICAL DATA:  Known intracranial hemorrhage. EXAM: MRI HEAD WITHOUT AND WITH CONTRAST MRA HEAD WITHOUT CONTRAST TECHNIQUE: Multiplanar, multiecho pulse sequences of the brain and surrounding structures were obtained without and with intravenous contrast. Angiographic images of the head were obtained using MRA technique without contrast. CONTRAST:  63m MULTIHANCE GADOBENATE DIMEGLUMINE 529 MG/ML IV SOLN COMPARISON:  CT head without contrast 05/03/2018, 05/01/2018, and 04/30/2018. FINDINGS: MRI HEAD FINDINGS Brain: Right occipital craniotomy for debulking of hemorrhage in the right cerebellum is again noted. Residual blood products are present. More lateral infarction is noted. There is edema surrounding the hemorrhage and infarct. No new hemorrhage is present. Mild residual mass effect on the fourth ventricle is again noted without obstruction or hydrocephalus. Moderate periventricular white matter changes are present bilaterally. No significant supratentorial hemorrhage is present. Postcontrast images demonstrate no pathologic enhancement. There is no mass lesion associated with the hemorrhage or adjacent infarct. Ventricles are proportionate to the degree of atrophy. No significant extra-axial fluid collection is present. Vascular: Flow is present in the major intracranial arteries. Skull and upper cervical spine: The skull base is otherwise within normal limits. Craniocervical junction is normal. Sinuses/Orbits: The paranasal sinuses are clear. There is some fluid in the mastoid air cells bilaterally. No obstructing nasopharyngeal lesion is present. MRA HEAD FINDINGS The time-of-flight images demonstrate no  focal vascular lesion associated with the area of hemorrhage. 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 An additional superior left paraophthalmic artery aneurysm measures 3 mm. A 2 mm aneurysm versus infundibulum is present at the right posterior communicating artery. The left A1 and M1 segments are normal. The right A1 segment is hypoplastic. A 4 mm anterior communicating artery aneurysm is present. The MCA  bifurcations are within normal limits bilaterally. There is some attenuation of distal MCA branch vessels without a significant proximal stenosis, occlusion, or aneurysm. There is some attenuation of distal ACA branch vessels as well. The vertebral arteries are codominant. The PICA origins are visualized and normal. The basilar artery is normal. Both posterior cerebral arteries originate from basilar tip. PCA branch vessels are within normal limits proximally. IMPRESSION: 1. Stable appearance of residual right cerebellar hemorrhage status post suboccipital craniotomy. 2. Acute/subacute nonhemorrhagic infarct lateral to the cerebellar hemorrhage. 3. There is edema surrounding the area of infarct and hemorrhage with decreasing mass effect on the fourth ventricle and no hydrocephalus. 4. Multiple intracranial aneurysms. 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 A 4 mm anterior communicating artery aneurysm is present. 5. No posterior circulation aneurysms or vascular lesions associated with the recent hemorrhage. 6. Moderate white matter changes bilaterally likely reflect the sequela of chronic microvascular ischemia. Electronically Signed   By: San Morelle M.D.   On: 05/05/2018 20:52   Mr Jeri Cos RA Contrast  Result Date: 05/05/2018 CLINICAL DATA:  Known intracranial hemorrhage. EXAM: MRI HEAD WITHOUT AND WITH CONTRAST MRA HEAD WITHOUT  CONTRAST TECHNIQUE: Multiplanar, multiecho pulse sequences of the brain and surrounding structures were obtained without and with intravenous contrast. Angiographic images of the head were obtained using MRA technique without contrast. CONTRAST:  22m MULTIHANCE GADOBENATE DIMEGLUMINE 529 MG/ML IV SOLN COMPARISON:  CT head without contrast 05/03/2018, 05/01/2018, and 04/30/2018. FINDINGS: MRI HEAD FINDINGS Brain: Right occipital craniotomy for debulking of hemorrhage in the right cerebellum is again noted. Residual blood products are present. More lateral infarction is noted. There is edema surrounding the hemorrhage and infarct. No new hemorrhage is present. Mild residual mass effect on the fourth ventricle is again noted without obstruction or hydrocephalus. Moderate periventricular white matter changes are present bilaterally. No significant supratentorial hemorrhage is present. Postcontrast images demonstrate no pathologic enhancement. There is no mass lesion associated with the hemorrhage or adjacent infarct. Ventricles are proportionate to the degree of atrophy. No significant extra-axial fluid collection is present. Vascular: Flow is present in the major intracranial arteries. Skull and upper cervical spine: The skull base is otherwise within normal limits. Craniocervical junction is normal. Sinuses/Orbits: The paranasal sinuses are clear. There is some fluid in the mastoid air cells bilaterally. No obstructing nasopharyngeal lesion is present. MRA HEAD FINDINGS The time-of-flight images demonstrate no focal vascular lesion associated with the area of hemorrhage. 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 An additional superior left paraophthalmic artery aneurysm measures 3 mm. A 2 mm aneurysm versus infundibulum is present at the right posterior communicating artery. The left A1 and M1 segments are normal. The right A1 segment is hypoplastic. A 4 mm anterior communicating artery  aneurysm is present. The MCA bifurcations are within normal limits bilaterally. There is some attenuation of distal MCA branch vessels without a significant proximal stenosis, occlusion, or aneurysm. There is some attenuation of distal ACA branch vessels as well. The vertebral arteries are codominant. The PICA origins are visualized and normal. The basilar artery is normal. Both posterior cerebral arteries originate from basilar tip. PCA branch vessels are within normal limits proximally. IMPRESSION: 1. Stable appearance of residual right cerebellar hemorrhage status post suboccipital craniotomy. 2. Acute/subacute nonhemorrhagic infarct lateral to the cerebellar hemorrhage. 3. There is edema surrounding the area of infarct and  hemorrhage with decreasing mass effect on the fourth ventricle and no hydrocephalus. 4. Multiple intracranial aneurysms. 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 A 4 mm anterior communicating artery aneurysm is present. 5. No posterior circulation aneurysms or vascular lesions associated with the recent hemorrhage. 6. Moderate white matter changes bilaterally likely reflect the sequela of chronic microvascular ischemia. Electronically Signed   By: San Morelle M.D.   On: 05/05/2018 20:52       Medical Problem List and Plan: 1.  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 2.  DVT Prophylaxis/Anticoagulation: Pharmaceutical: Heparin 3. Pain Management: hydrocodone prn for pain. Discontinue dilaudid.  4. Mood: LCSW to follow for evaluation and support.  5. Neuropsych: This patient is not fully capable of making decisions on her own behalf. 6. Skin/Wound Care: Monitor wound for healing. Maintain adequate nutritional and hydration status.  7. Fluids/Electrolytes/Nutrition: Monitor I/O.  Check lytes in am.  8. HTN: Monitor BP bid and continue to titrate medications for tighter control as still labile. Will add catapres for prn use.  9. Low calorie malnutrition:  Check magnesium/phosphorus levels in am. Add protein supplements.  10. Pre diabetes: Hgb A1C- 5.8  11. Hypokalemia: Supplement today with Kdur and Mag Ox.  12. Dizziness: Now on scopolamine patch tp help manage vestibular symptoms. May need to be premedicated prior to therapy.    Post Admission Physician Evaluation: 1. Preadmission assessment reviewed and changes made below. 2. Functional deficits secondary  to right cerebellar hemorrhage and infarct s/p evacuation. 3. Patient is admitted to receive collaborative, interdisciplinary care between the physiatrist, rehab nursing staff, and therapy team. 4. Patient's level of medical complexity and substantial therapy needs in context of that medical necessity cannot be provided at a lesser intensity of care such as a SNF. 5. Patient has experienced substantial functional loss from his/her baseline which was documented above under the "Functional History" and "Functional Status" headings.  Judging by the patient's diagnosis, physical exam, and functional history, the patient has potential for functional progress which will result in measurable gains while on inpatient rehab.  These gains will be of substantial and practical use upon discharge  in facilitating mobility and self-care at the household level. 6. Physiatrist will provide 24 hour management of medical needs as well as oversight of the therapy plan/treatment and provide guidance as appropriate regarding the interaction of the two. 7. 24 hour rehab nursing will assist with bladder management, safety, skin/wound care, disease management, medication administration and patient education  and help integrate therapy concepts, techniques,education, etc. 8. PT will assess and treat for/with: Lower extremity strength, range of  motion, stamina, balance, functional mobility, safety, adaptive techniques and equipment, wound care, coping skills, pain control, education. Goals are: supervision. 9. OT will assess and treat for/with: ADL's, functional mobility, safety, upper extremity strength, adaptive techniques and equipment, wound mgt, ego support, and community reintegration.   Goals are: supervision. Therapy may not proceed with showering this patient. 10. SLP will assess and treat for/with: language, cognition.  Goals are: modified I/supervision. 11. Case Management and Social Worker will assess and treat for psychological issues and discharge planning. 12. Team conference will be held weekly to assess progress toward goals and to determine barriers to discharge. 13. Patient will receive at least 3 hours of therapy per day at least 5 days per week. 14. ELOS:  11-16 days.       15. Prognosis:  good  I have personally performed a face to face diagnostic evaluation, including, but not limited to relevant history and physical exam findings, of this patient and developed relevant assessment and plan.  Additionally, I have reviewed and concur with the physician assistant's documentation above.  Delice Lesch, MD, ABPMR Bary Leriche, PA-C 05/07/2018

## 2018-05-07 NOTE — Discharge Summary (Signed)
  Physician Discharge Summary  Patient ID: Tiffany [facility] MRN: [id number] DOB/AGE: 05-29-1933 84 y.o.  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  Patient taken emergently to the operating room where she underwent a suboccipital craniectomy and evacuation of cerebellar hematoma.  Postoperatively she is done well.  She has had difficulty with some mild nausea and some intermittent dizziness but has had no other neurologic symptoms.  She is mobilizing with therapy and is felt to be ready for discharge to inpatient rehabilitation.  Consults:   Significant Diagnostic Studies:   Treatments:   Discharge Exam: Blood pressure (!) 164/65, pulse 65, temperature 98 F (36.7 C), temperature source Oral, resp. rate (!) 26, height 5' 8.11" (1.73 m), weight 64.6 kg (142 lb 6.7 oz), SpO2 93 %. She is awake and alert.  She is oriented and appropriate.  Cranial nerve function is intact except the patient has some intermittent nystagmus with left lateral gaze.  Motor examination 5/5.  No significant dysmetria.  Wound healing well.  Chest and abdomen benign.  Disposition:    Allergies as of 05/07/2018   No Known Allergies     Medication List    ASK your doctor about these medications   aspirin EC 81 MG tablet Take 81 mg by mouth daily.   levothyroxine 100 MCG tablet Commonly known as:  SYNTHROID, LEVOTHROID Take 100 mcg by mouth daily before breakfast.   multivitamin with minerals Tabs tablet Take 1 tablet by mouth daily.        Signed: Kathaleen MaserHenry A Arfa Lamarca 05/07/2018, 1:30 PM

## 2018-05-07 NOTE — Progress Notes (Addendum)
Inpatient Rehabilitation Admissions Coordinator  I have insurance approval to admit pt to inpt rehab today. I have called Dr. Jordan LikesPool and left a message for a call back to clarify if I can admit pt to CIR today.   Tiffany GlazierBarbara Britiany Silbernagel, RN, MSN Rehab Admissions Coordinator [phone] 05/07/2018 12:27 PM   Dr. Jordan LikesPool returned call and pt is medically ready to d/c to CIR today. I will make the arrangements to admit pt today. I have left pt's son, [name], a voicemail to make aware of admission to CIR.  Tiffany GlazierBarbara Yanuel Tagg, RN, MSN Rehab Admissions Coordinator [phone] 05/07/2018 1:27 PM

## 2018-05-07 NOTE — Plan of Care (Signed)
  Problem: RH BOWEL ELIMINATION Goal: RH STG MANAGE BOWEL WITH ASSISTANCE Description STG Manage Bowel with min Assistance.  Outcome: Not Progressing   Problem: RH BOWEL ELIMINATION Goal: RH STG MANAGE BOWEL W/MEDICATION W/ASSISTANCE Description STG Manage Bowel with Medication with Assistance. Outcome: Not Progressing   Problem: RH BLADDER ELIMINATION Goal: RH STG MANAGE BLADDER WITH ASSISTANCE Description STG Manage Bladder With min Assistance  Outcome: Not Progressing   Problem: RH SKIN INTEGRITY Goal: RH STG SKIN FREE OF INFECTION/BREAKDOWN Outcome: Not Progressing   Problem: RH SKIN INTEGRITY Goal: RH STG SKIN FREE OF INFECTION/BREAKDOWN Outcome: Not Progressing   Problem: RH SKIN INTEGRITY Goal: RH STG MAINTAIN SKIN INTEGRITY WITH ASSISTANCE Description STG Maintain Skin Integrity With mod Assistance.  Outcome: Not Progressing   Problem: RH SKIN INTEGRITY Goal: RH STG ABLE TO PERFORM INCISION/WOUND CARE W/ASSISTANCE Description STG Able To Perform Incision/Wound Care With mod Assistance.  Outcome: Not Progressing   Problem: RH SAFETY Goal: RH STG ADHERE TO SAFETY PRECAUTIONS W/ASSISTANCE/DEVICE Description STG Adhere to Safety Precautions With min-mod Assistance/Device.  Outcome: Not Progressing   Problem: RH PAIN MANAGEMENT Goal: RH STG PAIN MANAGED AT OR BELOW PT'S PAIN GOAL Outcome: Not Progressing   Problem: RH KNOWLEDGE DEFICIT GENERAL Goal: RH STG INCREASE KNOWLEDGE OF SELF CARE AFTER HOSPITALIZATION Outcome: Not Progressing  New Admit unable to note pt is progressing

## 2018-05-07 NOTE — Progress Notes (Signed)
No new problems or issues.  Patient still with significant dizziness.  No other cranial nerve dysfunction.  Afebrile.  Vitals are stable.  Urine output good.  Wound healing well.  Patient awake and alert.  She is oriented and somewhat depressed.  Examination of her head ears eyes nose throat is unremarkable.  Chest and abdomen are clear.  Motor is 5/5 bilaterally.  She still has a little bit of nystagmus with left lateral gaze.  Status post left-sided cerebellar hematoma evacuation.  Patient with some continued nausea, occasional vomiting and dizziness.  Continue supportive efforts.  Okay from my standpoint to move forward with inpatient rehabilitation.

## 2018-05-07 NOTE — Care Management Important Message (Signed)
Important Message  Patient Details  Name: Tiffany [facility] MRN: [id number] Date of Birth: 08/11/1933   Medicare Important Message Given:  Yes    Tashica Provencio 05/07/2018, 4:01 PM

## 2018-05-08 ENCOUNTER — Inpatient Hospital Stay (HOSPITAL_COMMUNITY): Payer: Medicare Other | Admitting: Physical Therapy

## 2018-05-08 ENCOUNTER — Inpatient Hospital Stay (HOSPITAL_COMMUNITY): Payer: Medicare Other | Admitting: Occupational Therapy

## 2018-05-08 ENCOUNTER — Inpatient Hospital Stay (HOSPITAL_COMMUNITY): Payer: Medicare Other

## 2018-05-08 DIAGNOSIS — I69393 Ataxia following cerebral infarction: Secondary | ICD-10-CM

## 2018-05-08 DIAGNOSIS — I614 Nontraumatic intracerebral hemorrhage in cerebellum: Secondary | ICD-10-CM

## 2018-05-08 DIAGNOSIS — H8149 Vertigo of central origin, unspecified ear: Secondary | ICD-10-CM

## 2018-05-08 DIAGNOSIS — Z789 Other specified health status: Secondary | ICD-10-CM

## 2018-05-08 LAB — GLUCOSE, CAPILLARY
GLUCOSE-CAPILLARY: 102 mg/dL — AB (ref 65–99)
Glucose-Capillary: 118 mg/dL — ABNORMAL HIGH (ref 65–99)
Glucose-Capillary: 94 mg/dL (ref 65–99)
Glucose-Capillary: 110 mg/dL — ABNORMAL HIGH (ref 65–99)

## 2018-05-08 LAB — CBC WITH DIFFERENTIAL/PLATELET
ABS IMMATURE GRANULOCYTES: 0.1 10*3/uL (ref 0.0–0.1)
Basophils Absolute: 0.1 10*3/uL (ref 0.0–0.1)
Basophils Relative: 1 %
Eosinophils Absolute: 0.1 10*3/uL (ref 0.0–0.7)
Eosinophils Relative: 1 %
HCT: 35.4 % — ABNORMAL LOW (ref 36.0–46.0)
HEMOGLOBIN: 11.7 g/dL — AB (ref 12.0–15.0)
Immature Granulocytes: 1 %
LYMPHS PCT: 18 %
Lymphs Abs: 1.7 10*3/uL (ref 0.7–4.0)
MCH: 31 pg (ref 26.0–34.0)
MCHC: 33.1 g/dL (ref 30.0–36.0)
MCV: 93.9 fL (ref 78.0–100.0)
MONO ABS: 1.4 10*3/uL — AB (ref 0.1–1.0)
MONOS PCT: 15 %
NEUTROS ABS: 5.9 10*3/uL (ref 1.7–7.7)
Neutrophils Relative %: 64 %
Platelets: 338 10*3/uL (ref 150–400)
RBC: 3.77 MIL/uL — ABNORMAL LOW (ref 3.87–5.11)
RDW: 13.4 % (ref 11.5–15.5)
WBC: 9.3 10*3/uL (ref 4.0–10.5)

## 2018-05-08 LAB — COMPREHENSIVE METABOLIC PANEL
ALK PHOS: 50 U/L (ref 38–126)
ALT: 26 U/L (ref 14–54)
AST: 32 U/L (ref 15–41)
Albumin: 2.4 g/dL — ABNORMAL LOW (ref 3.5–5.0)
Anion gap: 9 (ref 5–15)
BILIRUBIN TOTAL: 0.4 mg/dL (ref 0.3–1.2)
BUN: 9 mg/dL (ref 6–20)
CO2: 26 mmol/L (ref 22–32)
CREATININE: 0.93 mg/dL (ref 0.44–1.00)
Calcium: 8.5 mg/dL — ABNORMAL LOW (ref 8.9–10.3)
Chloride: 104 mmol/L (ref 101–111)
GFR, EST NON AFRICAN AMERICAN: 55 mL/min — AB (ref >60)
Glucose, Bld: 149 mg/dL — ABNORMAL HIGH (ref 65–99)
Potassium: 3.3 mmol/L — ABNORMAL LOW (ref 3.5–5.1)
Sodium: 139 mmol/L (ref 135–145)
Total Protein: 4.7 g/dL — ABNORMAL LOW (ref 6.5–8.1)

## 2018-05-08 LAB — PHOSPHORUS: Phosphorus: 2.9 mg/dL (ref 2.5–4.6)

## 2018-05-08 LAB — MAGNESIUM: MAGNESIUM: 2 mg/dL (ref 1.7–2.4)

## 2018-05-08 MED ORDER — POTASSIUM CHLORIDE CRYS ER 20 MEQ PO TBCR
20.0000 meq | EXTENDED_RELEASE_TABLET | Freq: Three times a day (TID) | ORAL | Status: AC
  Administered 2018-05-08 – 2018-05-10 (×6): 20 meq via ORAL
  Filled 2018-05-08 (×6): qty 1

## 2018-05-08 MED ORDER — POTASSIUM CHLORIDE CRYS ER 20 MEQ PO TBCR: 20.0000 meq | EXTENDED_RELEASE_TABLET | Freq: Three times a day (TID) | ORAL | Status: DC

## 2018-05-08 MED ORDER — SODIUM CHLORIDE 0.9% FLUSH: 10.0000 mL | INTRAVENOUS | Status: DC | PRN

## 2018-05-08 NOTE — Progress Notes (Signed)
At the beginning of the shift, patient experienced some dizziness, vertigo from raising the head of bed. She stated that it felt like her head was swimming & proceeded to get nauseous. After a cool cloth placed to her head & some deep breaths, her vertigo decreased & she was able to talk again. It took about 15 minutes. Patient has a scolpalamine patch behind her right ear & was given meclizine earlier. When patient was toileted, she again felt some of the same sensations. Medications were available but she was feeling nauseous from the position changes once again. Waited for approximately 15 minutes before attempting to give her compazine po. Waited another 10-15 minutes before giving her the rest of her oral medications. She was able to tolerate them that way. She had complained of hip & back pain earlier & the on call provider was called. An order for a k pad was given. K pad did arrive, but patient stated that the position change helped & she was not hurting at the time. She is verbal & responsive, alert & oriented times 4, continent of bowel & bladder. No BM since 04/29/18 as reported by oncoming nurse. She was given a laxative last night with no results yet. She stated that she has not been eating. She was able to eat some applesauce last night & was given her protein shake as well. Her blood pressure was elevated at the time of her med pass. After she was able to take her meds, her blood pressure was within the parameters desired. Her pulse is slightly irregular. She calls appropriately & recalls the events that led up to this admission. She moves all extremities with no problem, has strong grips & has no decreases in sensation.No c/o SOB, pain, numbness or tingling. No acute distress noted, will continue to monitor.

## 2018-05-08 NOTE — Evaluation (Signed)
Occupational Therapy Assessment and Plan  Patient Details  Name: Tiffany [facility] MRN: [id number] Date of Birth: 12/27/1932  OT Diagnosis: ataxia, cognitive deficits and hemiplegia affecting dominant side Rehab Potential: Rehab Potential (ACUTE ONLY): Excellent ELOS: 10-14 days   Today's Date: 05/08/2018 OT Individual Time: 1300-1415 OT Individual Time Calculation (min): 75 min     Problem List:  Patient Active Problem List   Diagnosis Date Noted  . Cerebellar hemorrhage, nontraumatic (HCC) 05/07/2018  . Ataxia, post-stroke   . Essential hypertension   . Prediabetes   . Hypokalemia   . Dizziness and giddiness   . Cognitive deficit, post-stroke   . Hypertensive emergency   . Hyperlipidemia   . Cerebellar edema ([place])   . Leukocytosis   . S/P craniotomy 04/30/2018  . Left-sided nontraumatic intracerebral hemorrhage of cerebellum ([place]) 04/30/2018    Past Medical History:  Past Medical History:  Diagnosis Date  . Hyperlipidemia   . Hypothyroid    Past Surgical History:  Past Surgical History:  Procedure Laterality Date  . ABDOMINAL HYSTERECTOMY    . APPENDECTOMY    . CHOLECYSTECTOMY    . CRANIOTOMY N/A 04/30/2018   Procedure: CRANIOTOMY HEMATOMA EVACUATION SUBDURAL;  Surgeon: Earnie Larsson, MD;  [facility];  Service: Neurosurgery;  Laterality: N/A;  suboccipital craniectomy for hematoma    Assessment & Plan Clinical Impression: Tiffany Thornton is an 84 year old female with history of hypothyroid otherwise in good health; who was admitted on 04/30/18 with acute mental status changes with nausea, vomiting and vertigo.  History taken from chart review. She was noted to have hypertensive emergency with systolic blood pressure greater than 200 and CT of head done, reviewed, showing right cerebellar hemorrhage. Per report, large acute right cerebellar hemorrhage with  mass-effect on 4th ventricle and waxing an waning of symptoms. She was evaluated by Dr. Ronnald Ramp and taken to OR for  suboccipital craniectomy and evacuation of hemorrhage.  Post op with mild dysarthria and right ataxia. 2 D echo showed EF 60-65% with grade 2 diastolic dysfunction and calcified mitral annulus with trivial regurgitation.   Dr. Erlinda Hong felt that bleed due to hypertensive emergency and SBP goals < 160.  MRI/MRA brain done revealing stable bleed with acute/subacute nonhemorrhagic infarct lateral to cerebellar hemorrhage, decreasing mass effect and no hydrocephalus and multiple intracranial aneurysms.  Scopolamine patch added to help with vertigo and mobility improving. She continues to have dizziness with activity as well as poor safety awareness. CIR recommended due to functional deficits.     Patient transferred to CIR on 05/07/2018 .    Patient currently requires mod with basic self-care skills secondary to decreased cardiorespiratoy endurance, impaired timing and sequencing, ataxia and decreased coordination, decreased attention, decreased awareness, decreased problem solving, decreased safety awareness and decreased memory and decreased standing balance, decreased postural control, hemiplegia and decreased balance strategies.  Prior to hospitalization, patient could complete ADLs/IADLs with independent .  Patient will benefit from skilled intervention to decrease level of assist with basic self-care skills, increase independence with basic self-care skills and increase level of independence with iADL prior to discharge home with care partner.  Anticipate patient will require 24 hour supervision and follow up outpatient.  OT - End of Session Activity Tolerance: Tolerates 10 - 20 min activity with multiple rests Endurance Deficit: Yes Endurance Deficit Description: Required rest breaks throughout standing grooming tasks OT Assessment Rehab Potential (ACUTE ONLY): Excellent OT Patient demonstrates impairments in the following area(s): Balance;Cognition;Safety;Endurance;Motor OT Basic ADL's Functional  Problem(s):  Eating;Grooming;Bathing;Dressing;Toileting OT Advanced ADL's Functional Problem(s): Simple Meal Preparation;Laundry;Light Housekeeping OT Transfers Functional Problem(s): Toilet;Tub/Shower OT Additional Impairment(s): Fuctional Use of Upper Extremity OT Plan OT Intensity: Minimum of 1-2 x/day, 45 to 90 minutes OT Frequency: 5 out of 7 days OT Duration/Estimated Length of Stay: 10-14 days OT Treatment/Interventions: Balance/vestibular training;Discharge planning;Pain management;Self Care/advanced ADL retraining;Therapeutic Activities;UE/LE Coordination activities;Visual/perceptual remediation/compensation;Therapeutic Exercise;Patient/family education;Functional mobility training;Disease mangement/prevention;Cognitive remediation/compensation;Community reintegration;DME/adaptive equipment instruction;Neuromuscular re-education;Psychosocial support;UE/LE Strength taining/ROM OT Self Feeding Anticipated Outcome(s): Mod I OT Basic Self-Care Anticipated Outcome(s): Supervision OT Toileting Anticipated Outcome(s): Supervision OT Bathroom Transfers Anticipated Outcome(s): Supervision OT Recommendation Recommendations for Other Services: Neuropsych consult;Vestibular eval;Therapeutic Recreation consult Therapeutic Recreation Interventions: Pet therapy;Kitchen group;Stress management;Outing/community reintergration Patient destination: Home Follow Up Recommendations: Outpatient OT Equipment Recommended: To be determined   Skilled Therapeutic Intervention Pt seen for OT eval and session focusing on functional transfers and education. Pt sitting up in w/c upon arrival with daughter present. Pt with complaints of headache though reports being pre-medicated prior to tx session.  She declined bathing/dressing this session. Completed toileting task with mod A for standing balance during clothing management and hygiene due to pt's poor safety awareness and impulsivity requiring multimodal cuing  for safety and sequencing.  She completed grooming tasks standing at sink requiring steadying assist for dynamic balance and demonstrating ataxic movements in R UE when using at dominant level. In ADL apartment, pt completed functional transfers sit<> stand low soft surface couch in simulation of home environment with min A. Completed simulated shower stall transfer with RW. Following demonstration for technique, pt return demonstrates with mod A. Pt very motivated to return to her very active PLOF and reports she is ready to begin mowing her grass again. Had pt ambulate with grocery cart in simulation of push mower, pt able to push cart back to room at end of session with min A overall and cuing for attention to task.  Pt left in supine at end of session, bed alarm on, daughter and RN present. Education provided throughout session regarding role of OT, POC, current deficits and functional implications, safety concerns, and d/c planning.   OT Evaluation Precautions/Restrictions  Precautions Precautions: Fall Restrictions Weight Bearing Restrictions: No General Chart Reviewed: Yes Home Living/Prior [place] expects to be discharged to:: Private residence Living Arrangements: Alone Available Help at Discharge: Available 24 hours/day, Family(Family to piece together 24 hr care) Type of Home: House Home Access: Stairs to enter [employer] of Steps: 1 Entrance Stairs-Rails: None Home Layout: One level Bathroom Shower/Tub: Multimedia programmer: Programmer, systems: Yes  Lives With: Alone IADL History Homemaking Responsibilities: Yes Current License: Yes Mode of Transportation: Car Occupation: Retired Type of Occupation: Retired Marine scientist from [employer] with basic ADLs, Independent with gait, Independent with homemaking with ambulation, Independent with transfers  Able to Take  Stairs?: Yes Driving: Yes Vocation: Retired Biomedical scientist: Volunteers, [place], active at [employer] Leisure: Hobbies-yes (Comment) Comments: mowing lawn and yard work Vision Baseline Vision/History: Wears glasses Wears Glasses: Reading only Patient Visual Report: No change from baseline Vision Assessment?: No apparent visual deficits Eye Alignment: Within Functional Limits Ocular Range of Motion: Within Functional Limits Tracking/Visual Pursuits: Decreased smoothness of horizontal tracking Convergence: Within functional limits Visual Fields: No apparent deficits Perception  Perception: Within Functional Limits Praxis Praxis: Intact Cognition Overall Cognitive Status: Impaired/Different from baseline Arousal/Alertness: Awake/alert Orientation Level: Person;Place;Situation Person: Oriented Place: Oriented Situation: Oriented Year: 2019 Month: June Day of Week: Correct Memory:  Impaired Memory Impairment: Decreased recall of new information Immediate Memory Recall: Sock;[facility];Bed Memory Recall: (Unable to recall any) Attention: Focused;Sustained Focused Attention: Appears intact Sustained Attention: Impaired Sustained Attention Impairment: Functional basic;Verbal basic Awareness: Impaired Awareness Impairment: Intellectual impairment Problem Solving: Impaired Problem Solving Impairment: Functional basic;Functional complex;Verbal complex;Verbal basic Executive Function: Reasoning;Sequencing;Self Correcting Reasoning: Impaired Reasoning Impairment: Functional basic;Functional complex Sequencing: Impaired Sequencing Impairment: Functional basic;Functional complex Self Correcting: Impaired Self Correcting Impairment: Functional basic;Functional complex Behaviors: Impulsive Safety/Judgment: Impaired Comments: Decreased safety awareness and poor awareness of deficits/ functional implications of deficits Sensation Sensation Light Touch: Appears Intact Proprioception:  Appears Intact Stereognosis: Appears Intact Coordination Gross Motor Movements are Fluid and Coordinated: No Fine Motor Movements are Fluid and Coordinated: Yes Coordination and Movement Description: Mild R ataxia Finger Nose Finger Test: Decreased accuracy R UE; WFL L UE Motor  Motor Motor: Motor apraxia Motor - Skilled Clinical Observations: R UE/LE ataxia Mobility  Bed Mobility Bed Mobility: Rolling Right;Right Sidelying to Sit;Supine to Sit;Sitting - Scoot to [employer] of Bed Rolling Right: Minimal Assistance - Patient > 75% Right Sidelying to Sit: Minimal Assistance - Patient > 75% Supine to Sit: Minimal Assistance - Patient > 75% Sitting - Scoot to Edge of Bed: Minimal Assistance - Patient > 75% Transfers Sit to Stand: Moderate Assistance - Patient 50-74% Stand to Sit: Minimal Assistance - Patient > 75%  Trunk/Postural Assessment  Cervical Assessment Cervical Assessment: Within Functional Limits Thoracic Assessment Thoracic Assessment: Within Functional Limits Lumbar Assessment Lumbar Assessment: Within Functional Limits Postural Control Postural Control: Deficits on evaluation(R lean with delayed righting reactions)  Balance Balance Balance Assessed: Yes Static Sitting Balance Static Sitting - Balance Support: Feet supported;Left upper extremity supported Static Sitting - Level of Assistance: 5: Stand by assistance Static Sitting - Comment/# of Minutes: Sitting on toilet Dynamic Sitting Balance Dynamic Sitting - Balance Support: During functional activity;Feet supported Dynamic Sitting - Level of Assistance: 5: Stand by assistance;4: Min assist Static Standing Balance Static Standing - Balance Support: During functional activity;No upper extremity supported Static Standing - Level of Assistance: 4: Min assist;3: Mod assist Static Standing - Comment/# of Minutes: Standing at sink Dynamic Standing Balance Dynamic Standing - Balance Support: During functional  activity Dynamic Standing - Level of Assistance: 3: Mod assist;2: Max assist Dynamic Standing - Comments: Standing during toileting task, poor awareness and impulsivity leading to need increased assist Extremity/Trunk Assessment RUE Assessment RUE Assessment: Within Functional Limits General Strength Comments: 5/5 throughout with full ROM LUE Assessment LUE Assessment: Within Functional Limits General Strength Comments: 5/5 throughout with full ROM   See Function Navigator for Current Functional Status.   Refer to Care Plan for Long Term Goals  Recommendations for other services: Therapeutic Recreation  Pet therapy, Kitchen group, Stress management and Outing/community reintegration   Discharge Criteria: Patient will be discharged from OT if patient refuses treatment 3 consecutive times without medical reason, if treatment goals not met, if there is a change in medical status, if patient makes no progress towards goals or if patient is discharged from hospital.  The above assessment, treatment plan, treatment alternatives and goals were discussed and mutually agreed upon: by patient and by family  Lahoma Constantin L 05/08/2018, 3:01 PM

## 2018-05-08 NOTE — Progress Notes (Signed)
Subjective/Complaints:  Nauseated, feels weak all over , ate and drank well for breakfast   ROS- neg CP, SOB, +nausea, no vomiting no abd pain  Objective: Vital Signs: Blood pressure (!) 172/63, pulse 71, temperature 97.7 F (36.5 C), resp. rate 17, height 5' 7"  (1.702 m), weight 65 kg (143 lb 4.8 oz), SpO2 94 %. No results found. Results for orders placed or performed during the hospital encounter of 05/07/18 (from the past 72 hour(s))  Glucose, capillary     Status: Abnormal   Collection Time: 05/07/18 10:08 PM  Result Value Ref Range   Glucose-Capillary 114 (H) 65 - 99 mg/dL   Comment 1 Notify RN   Magnesium     Status: None   Collection Time: 05/08/18  4:34 AM  Result Value Ref Range   Magnesium 2.0 1.7 - 2.4 mg/dL    Comment: Performed at [facility], [place] [address] [place], [place] [address]  Phosphorus     Status: None   Collection Time: 05/08/18  4:34 AM  Result Value Ref Range   Phosphorus 2.9 2.5 - 4.6 mg/dL    Comment: Performed at [facility] [address], [place], [place] [address]  CBC WITH DIFFERENTIAL     Status: Abnormal   Collection Time: 05/08/18  4:34 AM  Result Value Ref Range   WBC 9.3 4.0 - 10.5 K/uL   RBC 3.77 (L) 3.87 - 5.11 MIL/uL   Hemoglobin 11.7 (L) 12.0 - 15.0 g/dL   HCT 35.4 (L) 36.0 - 46.0 %   MCV 93.9 78.0 - 100.0 fL   MCH 31.0 26.0 - 34.0 pg   MCHC 33.1 30.0 - 36.0 g/dL   RDW 13.4 11.5 - 15.5 %   Platelets 338 150 - 400 K/uL   Neutrophils Relative % 64 %   Neutro Abs 5.9 1.7 - 7.7 K/uL   Lymphocytes Relative 18 %   Lymphs Abs 1.7 0.7 - 4.0 K/uL   Monocytes Relative 15 %   Monocytes Absolute 1.4 (H) 0.1 - 1.0 K/uL   Eosinophils Relative 1 %   Eosinophils Absolute 0.1 0.0 - 0.7 K/uL   Basophils Relative 1 %   Basophils Absolute 0.1 0.0 - 0.1 K/uL   Immature Granulocytes 1 %   Abs Immature Granulocytes 0.1 0.0 - 0.1 K/uL    Comment: Performed at [facility], [address] [place], [place] [address]   Comprehensive metabolic panel     Status: Abnormal   Collection Time: 05/08/18  4:34 AM  Result Value Ref Range   Sodium 139 135 - 145 mmol/L   Potassium 3.3 (L) 3.5 - 5.1 mmol/L   Chloride 104 101 - 111 mmol/L   CO2 26 22 - 32 mmol/L   Glucose, Bld 149 (H) 65 - 99 mg/dL   BUN 9 6 - 20 mg/dL   Creatinine, Ser 0.93 0.44 - 1.00 mg/dL   Calcium 8.5 (L) 8.9 - 10.3 mg/dL   Total Protein 4.7 (L) 6.5 - 8.1 g/dL   Albumin 2.4 (L) 3.5 - 5.0 g/dL   AST 32 15 - 41 U/L   ALT 26 14 - 54 U/L   Alkaline Phosphatase 50 38 - 126 U/L   Total Bilirubin 0.4 0.3 - 1.2 mg/dL   GFR calc non Af Amer 55 (L) >60 mL/min   GFR calc Af Amer >60 >60 mL/min    Comment: (NOTE) The eGFR has been calculated using the CKD EPI equation. This calculation has  not been validated in all clinical situations. eGFR's persistently <60 mL/min signify possible Chronic Kidney Disease.    Anion gap 9 5 - 15    Comment: Performed at [facility] [address] [place], [place] [address]  Glucose, capillary     Status: Abnormal   Collection Time: 05/08/18  7:07 AM  Result Value Ref Range   Glucose-Capillary 102 (H) 65 - 99 mg/dL   Comment 1 Notify RN      HEENT: normal Cardio: RRR and no murmur Resp: CTA B/L and Unlabored GI: BS positive and NT, ND Extremity:  No Edema Skin:   Bruise diffuse ecchymosis in BUE and BLE Neuro: Alert/Oriented, Anxious, Normal Sensory, Normal Motor, Abnormal FMC Ataxic/ dec FMC and Other 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 Physiatrist is providing close team supervision and 24 hour management of active medical problems listed below. Physiatrist and rehab team continue to assess barriers to  discharge/monitor patient progress toward functional and medical goals. FIM:                                  Medical Problem List and Plan: 1.  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 CIR PT, OT, SLP evals today 2.  DVT Prophylaxis/Anticoagulation: Pharmaceutical: Heparin 5000U Q 12h 3. Pain Management: hydrocodone prn for pain. Discontinue dilaudid.  4. Mood: LCSW to follow for evaluation and support.  5. Neuropsych: This patient is not fully capable of making decisions on her own behalf. 6. Skin/Wound Care: Monitor wound for healing. Maintain adequate nutritional and hydration status.  7. Fluids/Electrolytes/Nutrition: Monitor I/O. Check lytes in am. Poor intake 25% supper yesterday 8. HTN: Monitor BP bid and continue to titrate medications for tighter control as still labile. Will add catapres for prn use.  Apresoline 59m TID, lisinopril 223mBID Vitals:   05/07/18 2358 05/08/18 0533  BP: 139/74 (!) 172/63  Pulse: 76 71  Resp:  17  Temp:  97.7 F (36.5 C)  SpO2:  94%  lability will monitor 9. Low calorie malnutrition:  Check magnesium/phosphorus levels in am. Add protein supplements.  10. Pre diabetes: Hgb A1C- 5.8  11. Hypokalemia: Supplement today with Kdur and Mag Ox.  12. Dizziness:Central vestibular dysfunction  Now on scopolamine patch tp help manage vestibular symptoms. May need to be premedicated prior to therapy.    LOS (Days) 1 A FACE TO FACE EVALUATION WAS PERFORMED  AnCharlett Blake/09/2018, 7:57 AM

## 2018-05-08 NOTE — Evaluation (Signed)
Speech Language Pathology Assessment and Plan  Patient Details  Name: Tiffany Thornton MRN: [id number] Date of Birth: February 03, 1933  SLP Diagnosis: Dysarthria;Cognitive Impairments  Rehab Potential: Good ELOS: 12-18     Today's Date: 05/08/2018 SLP Individual Time: 0802-0900 SLP Individual Time Calculation (min): 58 min   Problem List:  Patient Active Problem List   Diagnosis Date Noted  . Cerebellar hemorrhage, nontraumatic (HCC) 05/07/2018  . Ataxia, post-stroke   . Essential hypertension   . Prediabetes   . Hypokalemia   . Dizziness and giddiness   . Cognitive deficit, post-stroke   . Hypertensive emergency   . Hyperlipidemia   . Cerebellar edema ([facility])   . Leukocytosis   . S/P craniotomy 04/30/2018  . Left-sided nontraumatic intracerebral hemorrhage of cerebellum ([facility]) 04/30/2018   Past Medical History:  Past Medical History:  Diagnosis Date  . Hyperlipidemia   . Hypothyroid    Past Surgical History:  Past Surgical History:  Procedure Laterality Date  . ABDOMINAL HYSTERECTOMY    . APPENDECTOMY    . CHOLECYSTECTOMY    . CRANIOTOMY N/A 04/30/2018   Procedure: CRANIOTOMY HEMATOMA EVACUATION SUBDURAL;  Surgeon: Tiffany Larsson, MD;  Location: [facility];  Service: Neurosurgery;  Laterality: N/A;  suboccipital craniectomy for hematoma    Assessment / Plan / Recommendation Clinical Impression Tiffany Thornton is an 84 year old female with history of hypothyroid otherwise in good health; who was admitted on 04/30/18 with acute mental status changes with nausea, vomiting and vertigo.  She was noted to have hypertensive emergency with systolic blood pressure greater than 200 and CT of head done revealing large acute right cerebellar hemorrhage with  mass-effect on 4th ventricle and waxing an waning of symptoms. She was evaluated by Dr. Ronnald Thornton and taken to OR for suboccipital craniectomy and evacuation of hemorrhage.  Post op with mild dysarthria and right ataxia. 2 D echo showed EF 60-65% with grade  2 diastolic dysfunction and calcified mitral annulus with trivial regurgitation. Dr. Erlinda Thornton felt that bleed due to hypertensive emergency and SBP goals < 160.  MRI/MRA brain done revealing stable bleed with acute/subacute nonhemorrhagic infarct lateral to cerebellar hemorrhage, decreasing mass effect and no hydrocephalus and multiple intracranial aneurysms.  Scopolamine patch added to help with vertigo and mobility improving. She continues to have dizziness with activity as well as poor safety awareness.  Pt presents with moderate cognitive linguistic deficits with impairments in sustained attention further exacerbated by internal distractions, safety/intelletcual awareness, and recall of novel information, impacting high level skills such as complex problem solving, which was supported by a score 19 out 30 on MOCA version 7.1 (n=>26.) Further assessment of problem solving skills required due to restrictions in assessment task due to pt's dizziness and right axtaxia. Pt presents with mild dysarthria impacting speech intelligibility at the sentence level. Pt presents with functional oral pharyngeal swallow fucntion given trials of regular textured foods and thin liquids via straw/cup.Pt would benefit from skilled ST services in order to maximize functional independence and reduce burden of care prior to discharge.    Skilled Therapeutic Interventions          Skilled ST services focused on cognitive skills. SLP facilitated basic problem solving skills with ADL task of brushing teeth, pt demonstrated Mod I problem solving skills, however required max A verbal cues for sustained attention to functional task in 5-10 minute intervals. Pt began to feel dizzy and requested to lay down. SLP facilitated deductive reasoning task to further assessment of higher level problem  solving, pt demonstrated impulsivity requiring max A verbal cues for redirection and eventually ended task due to pt's request for continued dizziness .  Pt was left in room with call bell within reach. Reccomend to continue skilled ST services.    SLP Assessment  Patient will need skilled Speech Lanaguage Pathology Services during CIR admission    Recommendations  SLP Diet Recommendations: Thin Liquid Administration via: Straw;Cup Medication Administration: Whole meds with liquid Supervision: Patient able to self feed Compensations: Minimize environmental distractions;Small sips/bites    SLP Frequency 3 to 5 out of 7 days   SLP Duration  SLP Intensity  SLP Treatment/Interventions 12-18   Minumum of 1-2 x/day, 30 to 90 minutes  Cognitive remediation/compensation;Cueing hierarchy;Functional tasks;Patient/family education;Environmental controls    Pain Pain Assessment Pain Scale: 0-10 Pain Score: 0-No pain Pain Location: Head Pain Descriptors / Indicators: Headache Pain Frequency: Occasional Pain Onset: On-going Patients Stated Pain Goal: 2 Pain Intervention(s): Medication (See eMAR)  Prior Functioning Cognitive/Linguistic Baseline: Within functional limits Type of Home: House  Lives With: Alone Available Help at Discharge: Available 24 hours/day;Family(Family to piece together 24 hr care) Vocation: Retired  Function:  Eating Eating   Modified Consistency Diet: No Eating Assist Level: Set up assist for   Eating Set Up Assist For: Opening containers       Cognition Comprehension Comprehension assist level: Understands basic 75 - 89% of the time/ requires cueing 10 - 24% of the time  Expression   Expression assist level: Expresses basic 50 - 74% of the time/requires cueing 25 - 49% of the time. Needs to repeat parts of sentences.  Social Interaction Social Interaction assist level: Interacts appropriately 75 - 89% of the time - Needs redirection for appropriate language or to initiate interaction.  Problem Solving Problem solving assist level: Solves basic 75 - 89% of the time/requires cueing 10 - 24% of the time   Memory Memory assist level: Recognizes or recalls 25 - 49% of the time/requires cueing 50 - 75% of the time   Short Term Goals: Week 1: SLP Short Term Goal 1 (Week 1): Pt will demonstrate sustained attention to functional task for 10 minutes with mod A verbal cues for redirection. SLP Short Term Goal 2 (Week 1): Pt will demonstrate 2 safety precuations during functional tasks with Mod A verbal cues.  SLP Short Term Goal 3 (Week 1): Pt will utilize external memory aid to recall new, daily information with min A verbal cues.  SLP Short Term Goal 4 (Week 1): Pt will utilize speech intelligibility strategies at the sentence level with min A verbal cues to achieve 80% intelligibility.  SLP Short Term Goal 5 (Week 1): Pt will utilize speech intelligibility strategies at the sentence level with min A verbal cues to achieve 80% intelligibility.   Refer to Care Plan for Long Term Goals  Recommendations for other services: Neuropsych  Discharge Criteria: Patient will be discharged from SLP if patient refuses treatment 3 consecutive times without medical reason, if treatment goals not met, if there is a change in medical status, if patient makes no progress towards goals or if patient is discharged from hospital.  The above assessment, treatment plan, treatment alternatives and goals were discussed and mutually agreed upon: by patient  Lendon George  [facility] 05/08/2018, 3:18 PM

## 2018-05-08 NOTE — Evaluation (Signed)
Physical Therapy Assessment and Plan  Patient Details  Name: Tiffany Thornton MRN: [id number] Date of Birth: 12-Jun-1933  PT Diagnosis: Abnormal posture, Abnormality of gait, Cognitive deficits, Coordination disorder and Dizziness and giddiness Rehab Potential: Good ELOS: 10-12 days   Today's Date: 05/08/2018 PT Individual Time: 1100-1200 PT Individual Time Calculation (min): 60 min    Problem List:  Patient Active Problem List   Diagnosis Date Noted  . Cerebellar hemorrhage, nontraumatic (HCC) 05/07/2018  . Ataxia, post-stroke   . Essential hypertension   . Prediabetes   . Hypokalemia   . Dizziness and giddiness   . Cognitive deficit, post-stroke   . Hypertensive emergency   . Hyperlipidemia   . Cerebellar edema ([place])   . Leukocytosis   . S/P craniotomy 04/30/2018  . Left-sided nontraumatic intracerebral hemorrhage of cerebellum ([place]) 04/30/2018    Past Medical History:  Past Medical History:  Diagnosis Date  . Hyperlipidemia   . Hypothyroid    Past Surgical History:  Past Surgical History:  Procedure Laterality Date  . ABDOMINAL HYSTERECTOMY    . APPENDECTOMY    . CHOLECYSTECTOMY    . CRANIOTOMY N/A 04/30/2018   Procedure: CRANIOTOMY HEMATOMA EVACUATION SUBDURAL;  Surgeon: Earnie Larsson, MD;  Location: [facility];  Service: Neurosurgery;  Laterality: N/A;  suboccipital craniectomy for hematoma    Assessment & Plan Clinical Impression: Tiffany Thornton is an 84 year old female with history of hypothyroid otherwise in good health; who was admitted on 04/30/18 with acute mental status changes with nausea, vomiting and vertigo.  History taken from chart review. She was noted to have hypertensive emergency with systolic blood pressure greater than 200 and CT of head done, reviewed, showing right cerebellar hemorrhage. Per report, large acute right cerebellar hemorrhage with  mass-effect on 4th ventricle and waxing an waning of symptoms. She was evaluated by Dr. Ronnald Ramp and taken to OR for  suboccipital craniectomy and evacuation of hemorrhage.  Post op with mild dysarthria and right ataxia. 2 D echo showed EF 60-65% with grade 2 diastolic dysfunction and calcified mitral annulus with trivial regurgitation. Dr. Erlinda Hong felt that bleed due to hypertensive emergency and SBP goals <160. MRI/MRA brain done revealing stable bleed with acute/subacute nonhemorrhagic infarct lateral to cerebellar hemorrhage, decreasing mass effect and no hydrocephalus and multiple intracranial aneurysms. Scopolamine patch added to help with vertigo and mobility improving. She continues to have dizziness with activity as well as poor safety awareness.     Patient transferred to CIR on 05/07/2018 .   Patient currently requires mod with mobility secondary to impaired timing and sequencing, unbalanced muscle activation, motor apraxia, decreased coordination and decreased motor planning and dizziness.  Prior to hospitalization, patient was independent  with mobility and lived with Alone in a House home.  Home access is 1Stairs to enter.  Patient will benefit from skilled PT intervention to maximize safe functional mobility and minimize fall risk for planned discharge home with possibility of interrmittant assist when family members are not working; however, recommending initial 24/7 supervision .  Anticipate patient will benefit from follow up [place] at discharge.  PT - End of Session Activity Tolerance: Tolerates 30+ min activity with multiple rests Endurance Deficit: No(Pt limited by dizziness requiring frequent seated rest breaks) PT Assessment Rehab Potential (ACUTE/IP ONLY): Good PT Barriers to Discharge: Decreased caregiver support;Medical stability(Pt fxnl mobility limited due to frequent episodes of dizziness leading to postural instability) PT Patient demonstrates impairments in the following area(s): Balance;Motor;Safety PT Transfers Functional Problem(s): Bed Mobility;Bed to  Chair;Car;Furniture;Floor PT  Locomotion Functional Problem(s): Ambulation;Stairs;Wheelchair Mobility PT Plan PT Intensity: Minimum of 1-2 x/day ,45 to 90 minutes PT Frequency: 5 out of 7 days PT Duration Estimated Length of Stay: 10-12 days PT Treatment/Interventions: Ambulation/gait training;Discharge planning;Functional mobility training;Psychosocial support;Therapeutic Activities;Balance/vestibular training;Neuromuscular re-education;Therapeutic Exercise;Wheelchair propulsion/positioning;DME/adaptive equipment instruction;UE/LE Strength taining/ROM;Community reintegration;Functional electrical stimulation;Patient/family education;Stair training;UE/LE Coordination activities PT Transfers Anticipated Outcome(s): Mod I with LRAD  PT Locomotion Anticipated Outcome(s): Mod I with LRAD PT Recommendation Recommendations for Other Services: Vestibular eval Follow Up Recommendations: Other (comment)(tbd) Patient destination: Home Equipment Recommended: To be determined  Skilled Therapeutic Intervention Pt reported no c/o pain prior to initial evaluation. Pt reports intermittent episodes of dizziness with no origin or cause onset. PT assessed bed mobility, transfers, strength, stair negotiation, and gait with no AD. PT initiated transfers from EOB<> w/c providing mod A and multimodal cueing for correct sequencing and safety considerations. Pt demo'd single LOB episode sitting EOB due to onset of dizziness requiring min A from PT for re-establishing postural stability. PT assessed Pt BP: 147/58; HR; 74 prior to physical activity. PT initiated stair negotiation trial in which Pt ascending/descended 4 steps using single HR for support and step- to stair pattern. Pt required min A for steadying and reported increased FOF forward when descending. Pt reports her son is planning on "redoing bathroom" to meet her functional needs and could potentially add HR to assist with single step when entering home. PT initiated gait assessment with no AD  and single HR for UE support. Pt amb. 12 feet demonstrating slow cadence, narrow BOS, and dec step length. Pt required multiple seated rest breaks t/o session due to dizziness onset with little resolution of sx. PT initiated car transfer<> w/c. Pt required multimodal cueing for proper extremity placement and hip extension activation for task completion. Pt demonstrates little carry over with transfer performance requiring consistent reinforcement from PT. Pt will benefit from transfer, gait, and dynamic balance training to further progress towards established goals. Pt was positioned in seated in preparation for lunch with call bell and tray table in reach and all needs met.   PT Evaluation Precautions/Restrictions Precautions Precautions: Fall Restrictions Weight Bearing Restrictions: No General Chart Reviewed: Yes Additional Pertinent History: Pt reports she will not have 24/7 care at home s/p d/c. Response to Previous Treatment: Not applicable Family/Caregiver Present: Yes(daughter) Vital SignsTherapy Vitals Pulse Rate: (!) 57 Resp: 18 BP: (!) 173/65 Patient Position (if appropriate): Sitting Oxygen Therapy SpO2: 96 % O2 Device: Room Air Pain Pain Assessment Pain Scale: 0-10 Pain Score: 0-No pain Pain Location: Head Pain Descriptors / Indicators: Headache Pain Frequency: Occasional Pain Onset: On-going Patients Stated Pain Goal: 2 Pain Intervention(s): Medication (See eMAR) Home Living/Prior Functioning Home Living Available Help at Discharge: Available 24 hours/day(pieced together by family) Type of Home: House Home Access: Stairs to enter Technical brewer of Steps: 1 Entrance Stairs-Rails: None Home Layout: One level Bathroom Shower/Tub: Other (comment)(Pt reports son is going to "redo bathroom" to meet Pt needs) Bathroom Toilet: Standard Bathroom Accessibility: Yes  Lives With: Alone Prior Function Level of Independence: Independent with basic ADLs;Independent  with gait;Independent with homemaking with ambulation;Independent with transfers  Able to Take Stairs?: Yes Vocation: Volunteer work [employer] Requirements: 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 but unable to specify Leisure: Hobbies-yes (Comment) Comments: mowing lawn and yard work Vision/Perception  Vision - Risk analyst: Within Designer, television/film set Perception: Within Functional Limits Praxis Praxis: Not tested  Cognition Overall Cognitive Status: Impaired/Different from baseline Arousal/Alertness: Awake/alert Orientation  Level: Oriented to person;Oriented to place;Oriented to situation Attention: Focused;Sustained Focused Attention: Appears intact Sustained Attention: Appears intact Memory: Impaired Memory Impairment: Decreased recall of new information Awareness: Impaired Problem Solving: Impaired Problem Solving Impairment: Functional basic;Functional complex;Verbal complex;Verbal basic Executive Function: Reasoning;Sequencing;Self Correcting Reasoning: Impaired Reasoning Impairment: Functional basic;Functional complex Sequencing: Impaired Sequencing Impairment: Functional basic;Functional complex Self Correcting: Impaired Self Correcting Impairment: Functional basic;Functional complex Safety/Judgment: Impaired Sensation Sensation Light Touch: Appears Intact Coordination Gross Motor Movements are Fluid and Coordinated: Not tested Fine Motor Movements are Fluid and Coordinated: Not tested Motor  Motor Motor: Motor apraxia  Mobility Bed Mobility Bed Mobility: Rolling Right;Right Sidelying to Sit;Supine to Sit;Sitting - Scoot to Edge of Bed Rolling Right: Minimal Assistance - Patient > 75% Right Sidelying to Sit: Minimal Assistance - Patient > 75% Supine to Sit: Minimal Assistance - Patient > 75% Sitting - Scoot to Edge of Bed: Minimal Assistance - Patient > 75% Transfers Transfers: Stand Pivot Transfers Sit to Stand: Moderate Assistance -  Patient 50-74% Stand to Sit: Minimal Assistance - Patient > 75% Stand Pivot Transfers: Moderate Assistance - Patient 50 - 74% Stand Pivot Transfer Details: Verbal cues for sequencing;Verbal cues for safe use of DME/AE;Manual facilitation for placement Transfer (Assistive device): None Locomotion  Gait Ambulation: Yes Gait Assistance: Minimal Assistance - Patient > 75% Ambulation Distance (Feet): 12 Feet Assistive device: 1 person hand held assist;Other (Comment)(I person HHA on R & L HR) Ambulation/Gait Assistance Details: Verbal cues for sequencing;Verbal cues for precautions/safety Gait Gait: Yes Gait Pattern: Step-to pattern;Decreased step length - left;Decreased stride length;Decreased step length - right;Decreased trunk rotation;Narrow base of support Stairs / Additional Locomotion Stairs: Yes Stairs Assistance: Minimal Assistance - Patient > 75% Stair Management Technique: One rail Left Number of Stairs: 4 Height of Stairs: 6 Wheelchair Mobility Wheelchair Mobility: No  Trunk/Postural Assessment     Balance Balance Balance Assessed: Yes Static Sitting Balance Static Sitting - Balance Support: Feet supported;Right upper extremity supported Static Sitting - Level of Assistance: 3: Mod assist Static Sitting - Comment/# of Minutes: Pt required mod A sitting EOB due to onset of dizziness requiring LOB episode.  Extremity Assessment      RLE Assessment RLE Assessment: Within Functional Limits LLE Assessment LLE Assessment: Within Functional Limits   See Function Navigator for Current Functional Status.   Refer to Care Plan for Long Term Goals  Recommendations for other services: Other: vestibular evaluation  Discharge Criteria: Patient will be discharged from PT if patient refuses treatment 3 consecutive times without medical reason, if treatment goals not met, if there is a change in medical status, if patient makes no progress towards goals or if patient is  discharged from hospital.  The above assessment, treatment plan, treatment alternatives and goals were discussed and mutually agreed upon: by patient  Floreen Comber 05/08/2018, 2:25 PM

## 2018-05-08 NOTE — Progress Notes (Signed)
Social Work Patient ID: Tiffany Thornton, female   DOB: 1933/09/26, 84 y.o.   MRN: [id number]   CSW attempted to see pt, but pt was asleep at the end of her therapy day and notes reflect she did not slept well last night, so CSW did not wake pt up.  CSW will meet with pt tomorrow to introduce self and role of CSW, as well as to complete assessment.

## 2018-05-08 NOTE — Progress Notes (Signed)
Patient information reviewed and entered into eRehab system by Penda Venturi, RN, CRRN, PPS Coordinator.  Information including medical coding and functional independence measure will be reviewed and updated through discharge.     Per nursing patient was given "Data Collection Information Summary for Patients in Inpatient Rehabilitation Facilities with attached "Privacy Act Statement-Health Care Records" upon admission.  

## 2018-05-09 ENCOUNTER — Inpatient Hospital Stay (HOSPITAL_COMMUNITY): Payer: Medicare Other | Admitting: *Deleted

## 2018-05-09 ENCOUNTER — Inpatient Hospital Stay (HOSPITAL_COMMUNITY): Payer: Medicare Other | Admitting: Physical Therapy

## 2018-05-09 ENCOUNTER — Inpatient Hospital Stay (HOSPITAL_COMMUNITY): Payer: Medicare Other | Admitting: Occupational Therapy

## 2018-05-09 ENCOUNTER — Inpatient Hospital Stay (HOSPITAL_COMMUNITY): Payer: Medicare Other | Admitting: Speech Pathology

## 2018-05-09 LAB — GLUCOSE, CAPILLARY
GLUCOSE-CAPILLARY: 122 mg/dL — AB (ref 65–99)
Glucose-Capillary: 104 mg/dL — ABNORMAL HIGH (ref 65–99)
Glucose-Capillary: 88 mg/dL (ref 65–99)
Glucose-Capillary: 80 mg/dL (ref 65–99)

## 2018-05-09 LAB — URINALYSIS, ROUTINE W REFLEX MICROSCOPIC
Bilirubin Urine: NEGATIVE
Glucose, UA: NEGATIVE mg/dL
Hgb urine dipstick: NEGATIVE
KETONES UR: NEGATIVE mg/dL
LEUKOCYTES UA: NEGATIVE
NITRITE: NEGATIVE
Protein, ur: NEGATIVE mg/dL
Specific Gravity, Urine: 1.006 (ref 1.005–1.030)
pH: 8 (ref 5.0–8.0)

## 2018-05-09 NOTE — IPOC Note (Signed)
Overall Plan of Care [facility]) Patient Details Name: Tiffany Thornton MRN: [id number] DOB: 1933-07-31  Admitting Diagnosis: <principal problem not specified>  Hospital Problems: Active Problems:   Cerebellar hemorrhage, nontraumatic (HCC)     Functional Problem List: Nursing Bladder, Bowel, Medication Management, Pain, Safety, Skin Integrity  PT Balance, Motor, Safety, Endurance  OT Balance, Cognition, Safety, Endurance, Motor  SLP Cognition  TR         Basic ADL's: OT Eating, Grooming, Bathing, Dressing, Toileting     Advanced  ADL's: OT Simple Meal Preparation, Laundry, Light Housekeeping     Transfers: PT Bed Mobility, Bed to Chair, Car, [employer], Floor  OT Toilet, Tub/Shower     Locomotion: PT Ambulation, Stairs, Psychologist, prison and probation services     Additional Impairments: OT Fuctional Use of Upper Extremity  SLP Social Cognition   Problem Solving, Memory, Attention, Awareness  TR      Anticipated Outcomes Item Anticipated Outcome  Self Feeding Mod I  Swallowing      Basic self-care  Supervision  Toileting  Supervision   Bathroom Transfers Supervision  Bowel/Bladder  Cont B/B, LBM 04/29/18. Wiill assist with bathroom needs as needed  Transfers  Mod I with LRAD   Locomotion  supervision with LRAD  Communication  Supervision A  Cognition  Min A   Pain  Denies pain, Assess pain, administer pain meds as needed. tolerable pain 4/10  Safety/Judgment  Call light at hand, bed alarm. Pt notes    Therapy Plan: PT Intensity: Minimum of 1-2 x/day ,45 to 90 minutes PT Frequency: 5 out of 7 days PT Duration Estimated Length of Stay: 10-12 days OT Intensity: Minimum of 1-2 x/day, 45 to 90 minutes OT Frequency: 5 out of 7 days OT Duration/Estimated Length of Stay: 10-14 days SLP Intensity: Minumum of 1-2 x/day, 30 to 90 minutes SLP Frequency: 3 to 5 out of 7 days SLP Duration/Estimated Length of Stay: 12-18     Team Interventions: Nursing Interventions Bladder Management,  Bowel Management, Pain Management, Medication Management, Skin Care/Wound Management  PT interventions Ambulation/gait training, Discharge planning, Functional mobility training, Psychosocial support, Therapeutic Activities, Balance/vestibular training, Neuromuscular re-education, Therapeutic Exercise, Wheelchair propulsion/positioning, DME/adaptive equipment instruction, UE/LE Strength taining/ROM, Community reintegration, Development worker, international aid stimulation, Patient/family education, Museum/gallery curator, UE/LE Coordination activities, Cognitive remediation/compensation  OT Interventions Warden/ranger, Discharge planning, Pain management, Self Care/advanced ADL retraining, Therapeutic Activities, UE/LE Coordination activities, Visual/perceptual remediation/compensation, Therapeutic Exercise, Patient/family education, Functional mobility training, Disease mangement/prevention, Cognitive remediation/compensation, Firefighter, Fish farm manager, Neuromuscular re-education, Psychosocial support, UE/LE Strength taining/ROM  SLP Interventions Cognitive remediation/compensation, Financial trader, Functional tasks, Patient/family education, Environmental controls  TR Interventions    SW/CM Interventions Discharge Planning, Psychosocial Support, Patient/Family Education   Barriers to Discharge MD  Medical stability  Nursing      PT Decreased caregiver support(Pt fxnl mobility limited due to frequent episodes of dizziness leading to postural instability)    OT      SLP      SW       Team Discharge Planning: Destination: PT-Home ,OT- Home , SLP-  Projected Follow-up: PT-Other (comment)(tbd), OT-  Outpatient OT, SLP-  Projected Equipment Needs: PT-To be determined, OT- To be determined, SLP-  Equipment Details: PT- , OT-  Patient/family involved in discharge planning: PT- Patient, Family member/caregiver,  OT-Patient, Family member/caregiver, SLP-   MD ELOS:  10-14d Medical Rehab Prognosis:  Good Assessment:  84 year old female with history of hypothyroid otherwise in good health; who was admitted on 04/30/18 with acute  mental status changes with nausea, vomiting and vertigo.  History taken from chart review. She was noted to have hypertensive emergency with systolic blood pressure greater than 200 and CT of head done, reviewed, showing right cerebellar hemorrhage. Per report, large acute right cerebellar hemorrhage with  mass-effect on 4th ventricle and waxing an waning of symptoms. She was evaluated by Dr. Yetta BarreJones and taken to OR for suboccipital craniectomy and evacuation of hemorrhage.  Post op with mild dysarthria and right ataxia. 2 D echo showed EF 60-65% with grade 2 diastolic dysfunction and calcified mitral annulus with trivial regurgitation.   Dr. Roda ShuttersXu felt that bleed due to hypertensive emergency and SBP goals < 160.  MRI/MRA brain done revealing stable bleed with acute/subacute nonhemorrhagic infarct lateral to cerebellar hemorrhage, decreasing mass effect and no hydrocephalus and multiple intracranial aneurysms  Now requiring 24/7 Rehab RN,MD, as well as CIR level PT, OT and SLP.  Treatment team will focus on ADLs and mobility with goals set at Supervision  See Team Conference Notes for weekly updates to the plan of care

## 2018-05-09 NOTE — Plan of Care (Signed)
  Problem: RH BOWEL ELIMINATION Goal: RH STG MANAGE BOWEL WITH ASSISTANCE Description STG Manage Bowel with min Assistance.  Outcome: Progressing   Problem: RH BOWEL ELIMINATION Goal: RH STG MANAGE BOWEL W/MEDICATION W/ASSISTANCE Description STG Manage Bowel with Medication with min Assistance.  Outcome: Progressing  Increasing fluid/fiber, administering prn bowel regimen   Problem: RH SKIN INTEGRITY Goal: RH STG MAINTAIN SKIN INTEGRITY WITH ASSISTANCE Description STG Maintain Skin Integrity With mod Assistance.  Outcome: Progressing   Problem: RH SKIN INTEGRITY Goal: RH STG ABLE TO PERFORM INCISION/WOUND CARE W/ASSISTANCE Description STG Able To Perform Incision/Wound Care With mod Assistance.  Outcome: Progressing  Assessing incision as needed Problem: RH SAFETY Goal: RH STG ADHERE TO SAFETY PRECAUTIONS W/ASSISTANCE/DEVICE Description STG Adhere to Safety Precautions With min-mod Assistance/Device.  Outcome: Progressing  Call light at hand, proper footwear, making sure pt able to maintain balance, careful when changing position.

## 2018-05-09 NOTE — Progress Notes (Signed)
Occupational Therapy Session Note  Patient Details  Name: Tiffany Thornton MRN: [id number] Date of Birth: Jun 06, 1933  Today's Date: 05/09/2018 OT Individual Time: 1300-1400 OT Individual Time Calculation (min): 60 min    Short Term Goals: Week 1:  OT Short Term Goal 1 (Week 1): Pt will complete 2 grooming tasks standing at sink with supervision to increase functional activity tolerance OT Short Term Goal 2 (Week 1): Pt will maintain sustained attention during 2 consecutive functional tasks with min cuing OT Short Term Goal 3 (Week 1): Pt will complete 3/3 toileting tasks with min steadying assist  Skilled Therapeutic Interventions/Progress Updates:    Pt seen for OT ADL bathing/dressing session. Pt in supine upon arrival with RN present administering medications. Pt voicing increased fatigue and complaints of constipation. Pt willing to participate in therapy as able. She ambulated short distance to w/c with mod HHA, VCs for safety due to impulsivity. She completed bathing/dressing from w/c  Level at sink required rest breaks throughout due to decreased functional activity tolerance. She stood at sink with min-mod steadying assist while she complete pericare/buttock hygiene and LB clothing management. Pt with increased dizziness upon bending down to don and tie shoes, recommend to obtain figure 4 sitting position to don shoes in order to decrease positional changes secondary to vertigo.  Pt ambulated to recliner at end of session with encouragement to stay sitting up in order to increase OOB tolerance.  Pt left seated in recliner at end of session, chair alarm on and all needs in reach. Cont to educate regarding need for assist with mobility and use of call bell.  Therapy Documentation Precautions:  Precautions Precautions: Fall Restrictions Weight Bearing Restrictions: No Pain: Pain Assessment Pain Scale: 0-10  See Function Navigator for Current Functional Status.   Therapy/Group:  Individual Therapy  [name] 05/09/2018, 7:13 AM

## 2018-05-09 NOTE — Progress Notes (Signed)
Subjective/Complaints:  ROS- neg CP, SOB, +nausea, no vomiting no abd pain  Objective: Vital Signs: Blood pressure (!) 132/54, pulse 60, temperature 99 F (37.2 C), temperature source Oral, resp. rate 18, height 5' 7"  (1.702 m), weight 65.1 kg (143 lb 8.3 oz), SpO2 90 %. No results found. Results for orders placed or performed during the hospital encounter of 05/07/18 (from the past 72 hour(s))  Glucose, capillary     Status: Abnormal   Collection Time: 05/07/18 10:08 PM  Result Value Ref Range   Glucose-Capillary 114 (H) 65 - 99 mg/dL   Comment 1 Notify RN   Magnesium     Status: None   Collection Time: 05/08/18  4:34 AM  Result Value Ref Range   Magnesium 2.0 1.7 - 2.4 mg/dL    Comment: Performed at [facility], [place] [address] [place], [place] [address]  Phosphorus     Status: None   Collection Time: 05/08/18  4:34 AM  Result Value Ref Range   Phosphorus 2.9 2.5 - 4.6 mg/dL    Comment: Performed at [facility] [address], [place], [place] [address]  CBC WITH DIFFERENTIAL     Status: Abnormal   Collection Time: 05/08/18  4:34 AM  Result Value Ref Range   WBC 9.3 4.0 - 10.5 K/uL   RBC 3.77 (L) 3.87 - 5.11 MIL/uL   Hemoglobin 11.7 (L) 12.0 - 15.0 g/dL   HCT 35.4 (L) 36.0 - 46.0 %   MCV 93.9 78.0 - 100.0 fL   MCH 31.0 26.0 - 34.0 pg   MCHC 33.1 30.0 - 36.0 g/dL   RDW 13.4 11.5 - 15.5 %   Platelets 338 150 - 400 K/uL   Neutrophils Relative % 64 %   Neutro Abs 5.9 1.7 - 7.7 K/uL   Lymphocytes Relative 18 %   Lymphs Abs 1.7 0.7 - 4.0 K/uL   Monocytes Relative 15 %   Monocytes Absolute 1.4 (H) 0.1 - 1.0 K/uL   Eosinophils Relative 1 %   Eosinophils Absolute 0.1 0.0 - 0.7 K/uL   Basophils Relative 1 %   Basophils Absolute 0.1 0.0 - 0.1 K/uL   Immature Granulocytes 1 %   Abs Immature Granulocytes 0.1 0.0 - 0.1 K/uL    Comment: Performed at [facility], [address], [place], [place] [address]  Comprehensive metabolic panel     Status: Abnormal    Collection Time: 05/08/18  4:34 AM  Result Value Ref Range   Sodium 139 135 - 145 mmol/L   Potassium 3.3 (L) 3.5 - 5.1 mmol/L   Chloride 104 101 - 111 mmol/L   CO2 26 22 - 32 mmol/L   Glucose, Bld 149 (H) 65 - 99 mg/dL   BUN 9 6 - 20 mg/dL   Creatinine, Ser 0.93 0.44 - 1.00 mg/dL   Calcium 8.5 (L) 8.9 - 10.3 mg/dL   Total Protein 4.7 (L) 6.5 - 8.1 g/dL   Albumin 2.4 (L) 3.5 - 5.0 g/dL   AST 32 15 - 41 U/L   ALT 26 14 - 54 U/L   Alkaline Phosphatase 50 38 - 126 U/L   Total Bilirubin 0.4 0.3 - 1.2 mg/dL   GFR calc non Af Amer 55 (L) >60 mL/min   GFR calc Af Amer >60 >60 mL/min    Comment: (NOTE) The eGFR has been calculated using the CKD EPI equation. This calculation has not been validated in all clinical situations. eGFR's persistently <60 mL/min  signify possible Chronic Kidney Disease.    Anion gap 9 5 - 15    Comment: Performed at [facility] [address] [place], [place] [address]  Glucose, capillary     Status: Abnormal   Collection Time: 05/08/18  7:07 AM  Result Value Ref Range   Glucose-Capillary 102 (H) 65 - 99 mg/dL   Comment 1 Notify RN   Glucose, capillary     Status: Abnormal   Collection Time: 05/08/18 12:12 PM  Result Value Ref Range   Glucose-Capillary 118 (H) 65 - 99 mg/dL  Glucose, capillary     Status: None   Collection Time: 05/08/18  4:59 PM  Result Value Ref Range   Glucose-Capillary 94 65 - 99 mg/dL  Glucose, capillary     Status: Abnormal   Collection Time: 05/08/18  9:04 PM  Result Value Ref Range   Glucose-Capillary 110 (H) 65 - 99 mg/dL  Glucose, capillary     Status: Abnormal   Collection Time: 05/09/18  6:21 AM  Result Value Ref Range   Glucose-Capillary 104 (H) 65 - 99 mg/dL     HEENT: normal Cardio: RRR and no murmur Resp: CTA B/L and Unlabored GI: BS positive and NT, ND Extremity:  No Edema Skin:   Bruise diffuse ecchymosis in BUE and BLE Neuro: Alert/Oriented, Anxious, Normal Sensory, Normal Motor, Abnormal FMC  Ataxic/ dec FMC and Other 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 Physiatrist is providing close team supervision and 24 hour management of active medical problems listed below. Physiatrist and rehab team continue to assess barriers to discharge/monitor patient progress toward functional and medical goals. FIM: Function - Bathing Bathing activity did not occur: Refused  Function- Upper Body Dressing/Undressing Upper body dressing/undressing activity did not occur: Refused Function - Lower Body Dressing/Undressing Lower body dressing/undressing activity did not occur: Refused  Function - Toileting Toileting steps completed by patient: Adjust clothing prior to toileting, Adjust clothing after toileting, Performs perineal hygiene Toileting steps completed by helper: Adjust clothing after toileting, Performs perineal hygiene, Adjust clothing prior to toileting Toileting Assistive Devices: Grab bar or rail Assist level: Touching or steadying assistance (Pt.75%)  Function - Air cabin crew transfer assistive device: Grab bar Assist level to toilet: Touching or steadying assistance (Pt > 75%) Assist level from toilet: Moderate assist (Pt 50 - 74%/lift or lower)  Function - Chair/bed transfer Chair/bed transfer method: Stand pivot Chair/bed transfer assist level: Moderate assist (Pt 50 - 74%/lift or lower) Chair/bed transfer assistive device: Armrests Chair/bed transfer details: Verbal cues for sequencing, Tactile cues for sequencing, Verbal cues for safe use of DME/AE  Function - Locomotion: Wheelchair Will patient use wheelchair at discharge?: Yes Type: Manual Function - Locomotion:  Ambulation Assistive device: Hand held assist, Rail in hallway Max distance: 12 Assist level: Touching or steadying assistance (Pt > 75%) Assist level: Touching or steadying assistance (Pt > 75%)  Function - Comprehension Comprehension: Auditory Comprehension assist level: Understands basic 75 - 89% of the time/ requires cueing 10 - 24% of the time  Function - Expression Expression: Verbal Expression assist level: Expresses basic 50 - 74% of the time/requires cueing 25 - 49% of the time. Needs to repeat parts of sentences.  Function -  Social Interaction Social Interaction assist level: Interacts appropriately 75 - 89% of the time - Needs redirection for appropriate language or to initiate interaction.  Function - Problem Solving Problem solving assist level: Solves basic 75 - 89% of the time/requires cueing 10 - 24% of the time  Function - Memory Memory assist level: Recognizes or recalls 25 - 49% of the time/requires cueing 50 - 75% of the time Patient normally able to recall (first 3 days only): That he or she is in a hospital  Medical Problem List and Plan: 1.  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 CIR PT, OT, SLP, Team conference today please see physician documentation under team conference tab, met with team face-to-face to discuss problems,progress, and goals. Formulized individual treatment plan based on medical history, underlying problem and comorbidities. 2.  DVT Prophylaxis/Anticoagulation: Pharmaceutical: Heparin 5000U Q 12h 3. Pain Management: hydrocodone prn for pain. Discontinue dilaudid.  4. Mood: LCSW to follow for evaluation and support.  5. Neuropsych: This patient is not fully capable of making decisions on her own behalf. 6. Skin/Wound Care: Monitor wound for healing. Maintain adequate nutritional and hydration status.  7. Fluids/Electrolytes/Nutrition: Monitor I/O. Check lytes in am. Poor/fair intake  30-50% meals yesterday Keep central line for now given d/c of scopolamine may increase dizziness nausea and decrease intake. 8. HTN: Monitor BP bid and continue to titrate medications for tighter control as still labile. Will add catapres for prn use.  Apresoline 49m TID, lisinopril 24mBID Vitals:   05/08/18 2240 05/09/18 0507  BP: (!) 146/58 (!) 132/54  Pulse: 72 60  Resp:  18  Temp:  99 F (37.2 C)  SpO2: 94% 90%  good control 9. Low calorie malnutrition:  Check magnesium/phosphorus levels in am. Add protein supplements.  10. Pre diabetes: Hgb A1C- 5.8  11. Hypokalemia: Supplement today with Kdur and Mag Ox.  12. Dizziness:Central vestibular dysfunction  Now on scopolamine patch tp help manage vestibular symptoms. May need to be premedicated prior to therapy. 14.  Per son pt has more slurring of speech and personality changes- d/c scopolamine Also check UA  LOS (Days) 2 A FACE TO FACE EVALUATION WAS PERFORMED  AnCharlett Blake/10/2018, 8:05 AM

## 2018-05-09 NOTE — Progress Notes (Signed)
Physical Therapy Session Note  Patient Details  Name: Tiffany Thornton MRN: [id number] Date of Birth: Jul 24, 1933  Today's Date: 05/09/2018 PT Individual Time: [id number] PT Individual Time Calculation (min): 60 min   Short Term Goals: Week 1:  PT Short Term Goal 1 (Week 1): Pt will perform stand pivot transfers with LRAD with min A PT Short Term Goal 2 (Week 1): Pt will ambulate 50 feet with LRAD and min A PT Short Term Goal 3 (Week 1): Pt will perform 8 steps with min A  Skilled Therapeutic Interventions/Progress Updates: Pt received in bed, denies pain at rest but does report pain at central line placement in neck, and at back of head at surgical incision with head/neck movements throughout session. Treatment session modified to accommodate for pain. Educated pt in goal of vestibular assessment to determine cause/treatment of vestibular impairments s/p cerebellar CVA. Pt reports dizziness primarily when sitting/standing/walking, reports a general "unsteadiness" as well as "bouncing" visual field with ambulation consistent with oscillopsia and impaired VOR. Denies hearing loss/changes.   Sensation: proprioception intact at BLE hallux Strength: WFL Coordination: dysmetria RLE>LLE with heel to shin in supine, RUE dysmetria with finger to nose Extraocular ROM: WFL Smooth pursuits: WFL Convergence: WFL Saccades: Impaired; undershooting R/L Spontaneous/gaze-evoked nystagmus: both negative Cervical ROM: Limited to approx 5 degrees R rotation, 15 degrees L rotation, slow movements d/t pain avoidance behavior, also suspect guarding d/t oscillopsia VOR to slow rotation: Impaired, requires corrective saccade with small cervical rotation ROM (limited by cervical pain d/t central line) Rapid head thrust: NT d/t cervical pain/central line and limited ROM Head shaking nystamus: NT as above Positional testing: Mild increase in dizziness with slow rolling R/L and supine>sit, negative for nystagmus, however  speed of performing tasks limited d/t pain which may have blunted responses to position changes. Continue to assess as able as unable to rule our positional component at this time.  Suspect central origin VOR impairment in addition to occular and limb dysmetria contributing to dizziness, oscillopsia and unsteadiness with gait, consistent with cerebellar CVA. Unable to rule out positional vertigo d/t limited assessment with pain. Recommend further balance assessment as tolerated (I.e. Berg, DGI, TUG) to determine specific impairments.  Educated pt on seated VOR x1 exercises, and saccades between two fingers; provided handout and pt demonstrated exercise within available cervical ROM. Provided handout with pictures/directions, 3 x 30 sec, 3x/day. VOR exercises performed with significantly reduced ROM and speed compared to typical recommendations, however even in this range/speed pt noted to have difficulty maintaining fixation to target. Continue to progress as able. Provided extensive education regarding cause of impairments, goal of exercises to retrain VOR/extraocular mm coordination and reduce symptoms, as well as ongoing balance/coordination retraining. Returned to bed at end of session as pt very tired and mild increase in dizziness following exercises. Stand pivot transfer min/modA throughout session, sit <>supine minA. Remained supine in bed, alarm intact all needs in reach.       Therapy Documentation Precautions:  Precautions Precautions: Fall Restrictions Weight Bearing Restrictions: No General:   Pain: Pain Assessment Pain Scale: 0-10 Pain Score: 0-No pain   See Function Navigator for Current Functional Status.   Therapy/Group: Individual Therapy  Vista Lawmanlizabeth J Tygielski 05/09/2018, 12:24 PM

## 2018-05-09 NOTE — Progress Notes (Signed)
Recreational Therapy Session Note  Patient Details  Name: Tiffany Thornton MRN: [id number] Date of Birth: [birth year] Today's Date: 05/09/2018  Pain:   No c/o Met briefly with pt today to discuss TR services.  Pt requesting assistance with lower body dressing in preparation for next therapy session. Pt with complaints with dizziness with mobility and requested to dress in bed.  Pt required assistance to thread both feet through underwear and pant legs and the performed bridging with supervision while pulling up her clothing.  Nurse at bedside administering meds and Doristine Bosworth in to pray with pt.  Full eval to follow at next scheduled therapy session. Chalmers 05/09/2018, 4:24 PM

## 2018-05-09 NOTE — Progress Notes (Signed)
Last night, patient's blood pressure was again elevated at 195/ 82, pulse 79. She was given her lisinopril & prn clonidine. Blood pressure decreased to 146/58, pulse 72 and was able to give her the scheduled dose of hydralazine. No c/o pain or headache during the shift. No c/o vertigo & medications were taken without difficulty. Patient did have periods of confusion after waking, thinking that her daughter was talking to her and also telling the nurse tech that her feet were black, to take them off. She was reoriented quickly, no signs of respiratory distress. Will continue to monitor

## 2018-05-09 NOTE — Progress Notes (Signed)
Speech Language Pathology Daily Session Note  Patient Details  Name: Tiffany Thornton MRN: [id number] Date of Birth: 1933-05-17  Today's Date: 05/09/2018 SLP Individual Time: 1105-1200 SLP Individual Time Calculation (min): 55 min  Short Term Goals: Week 1: SLP Short Term Goal 1 (Week 1): Pt will demonstrate sustained attention to functional task for 10 minutes with mod A verbal cues for redirection. SLP Short Term Goal 2 (Week 1): Pt will demonstrate 2 safety precuations during functional tasks with Mod A verbal cues.  SLP Short Term Goal 3 (Week 1): Pt will utilize external memory aid to recall new, daily information with min A verbal cues.  SLP Short Term Goal 4 (Week 1): Pt will utilize speech intelligibility strategies at the sentence level with min A verbal cues to achieve 80% intelligibility.  SLP Short Term Goal 5 (Week 1): Pt will utilize speech intelligibility strategies at the sentence level with min A verbal cues to achieve 80% intelligibility.   Skilled Therapeutic Interventions: Pt was seen for skilled ST targeting cognitive-linguistic goals.  Pt was in bed upon arrival, resting, but awakened easily and agreeable to participating in therapies.  Pt needed min question cues to recall events from previous therapy session during conversation with therapist.  SLP facilitated the session with a novel card game to address education and carryover of speech intelligibility strategies.  Pt needed overall mod assist verbal cues to slow rate and overarticulate in order to achieve intelligibility at the sentence level.  Xerostomia does appear to be impacting function but pt also appears to have impaired coordination for articulation.  Pt requested to use the bathroom at the end of today's therapy session and needed mod cues for safety due to impulsivity with mobility.  Pt was returned to bed and left with bed alarm set and call bell within reach.  Continue per current plan of care.     Function:  Eating Eating                 Cognition Comprehension Comprehension assist level: Follows basic conversation/direction with extra time/assistive device  Expression   Expression assist level: Expresses basic 75 - 89% of the time/requires cueing 10 - 24% of the time. Needs helper to occlude trach/needs to repeat words.  Social Interaction Social Interaction assist level: Interacts appropriately 75 - 89% of the time - Needs redirection for appropriate language or to initiate interaction.  Problem Solving Problem solving assist level: Solves basic 75 - 89% of the time/requires cueing 10 - 24% of the time  Memory Memory assist level: Recognizes or recalls 75 - 89% of the time/requires cueing 10 - 24% of the time    Pain Pain Assessment Pain Scale: 0-10 Pain Score: 0-No pain  Therapy/Group: Individual Therapy  Carter Kassel, Melanee SpryNicole L 05/09/2018, 12:37 PM

## 2018-05-10 ENCOUNTER — Inpatient Hospital Stay (HOSPITAL_COMMUNITY): Payer: Medicare Other | Admitting: Physical Therapy

## 2018-05-10 ENCOUNTER — Inpatient Hospital Stay (HOSPITAL_COMMUNITY): Payer: Medicare Other | Admitting: Speech Pathology

## 2018-05-10 ENCOUNTER — Inpatient Hospital Stay (HOSPITAL_COMMUNITY): Payer: Medicare Other | Admitting: Occupational Therapy

## 2018-05-10 LAB — GLUCOSE, CAPILLARY
GLUCOSE-CAPILLARY: 85 mg/dL (ref 65–99)
GLUCOSE-CAPILLARY: 148 mg/dL — AB (ref 65–99)
Glucose-Capillary: 96 mg/dL (ref 65–99)
Glucose-Capillary: 78 mg/dL (ref 65–99)

## 2018-05-10 LAB — BASIC METABOLIC PANEL
ANION GAP: 7 (ref 5–15)
BUN: 17 mg/dL (ref 6–20)
CHLORIDE: 104 mmol/L (ref 101–111)
CO2: 29 mmol/L (ref 22–32)
Calcium: 8.5 mg/dL — ABNORMAL LOW (ref 8.9–10.3)
Creatinine, Ser: 0.95 mg/dL (ref 0.44–1.00)
GFR calc Af Amer: >60 mL/min (ref >60)
GFR, EST NON AFRICAN AMERICAN: 54 mL/min — AB (ref >60)
Glucose, Bld: 92 mg/dL (ref 65–99)
POTASSIUM: 4.7 mmol/L (ref 3.5–5.1)
Sodium: 140 mmol/L (ref 135–145)

## 2018-05-10 NOTE — Progress Notes (Signed)
Physical Therapy Session Note  Patient Details  Name: Tiffany Thornton MRN: [id number] Date of Birth: 1933-10-12  Today's Date: 05/10/2018 PT Individual Time: 1430-1530 PT Individual Time Calculation (min): 60 min   Short Term Goals: Week 1:  PT Short Term Goal 1 (Week 1): Pt will perform stand pivot transfers with LRAD with min A PT Short Term Goal 2 (Week 1): Pt will ambulate 50 feet with LRAD and min A PT Short Term Goal 3 (Week 1): Pt will perform 8 steps with min A  Skilled Therapeutic Interventions/Progress Updates:    Pt received supine in bed, agreeable to PT. No complaints of pain. Pt reports she just received a suppository and doesn't want to leave her room for fear of being too far from a bathroom. Pt agreeable to participate in therapy session in her room. Supine, seated, and standing BLE therex x 10-15 reps with 3# ankle weights. Supine to/from sit with SBA with increased time to complete transfer due to onset of dizziness with transfers. Sit to stand x 5 reps with focus on hand placement and safe RW use during transfer. Standing balance: bean bag toss with one UE support and alt L/R tosses, CGA. Pt left supine in bed with needs in reach, bed alarm in place.  Therapy Documentation Precautions:  Precautions Precautions: Fall Restrictions Weight Bearing Restrictions: No  See Function Navigator for Current Functional Status.   Therapy/Group: Individual Therapy  Peter Congoaylor Ravin Denardo, PT, DPT  05/10/2018, 4:59 PM

## 2018-05-10 NOTE — Progress Notes (Signed)
Occupational Therapy Session Note  Patient Details  Name: Tiffany Thornton MRN: [id number] Date of Birth: May 21, 1933  Today's Date: 05/10/2018 OT Individual Time: [id number] and 1330-1400 OT Individual Time Calculation (min): 75 min and 30 min   Short Term Goals: Week 1:  OT Short Term Goal 1 (Week 1): Pt will complete 2 grooming tasks standing at sink with supervision to increase functional activity tolerance OT Short Term Goal 2 (Week 1): Pt will maintain sustained attention during 2 consecutive functional tasks with min cuing OT Short Term Goal 3 (Week 1): Pt will complete 3/3 toileting tasks with min steadying assist  Skilled Therapeutic Interventions/Progress Updates:    Session One: Pt seen for OT ADL session focusing on functional mobility and ADL re-training. Pt in awaken supine upon arrival, agreeable to tx session. She denied pain though voiced some dizziness though reports it is improving. She ambulated throughout session with mod A overall using RW, posterior bias and VCs for RW management and safety awareness. She ambulated into bathroom and completed 3/3 toleting tasks with heavy steadying assist, returned to sink to complete hand hygiene. Seated rest break provided before pt ambulated to closet to pick out clothes for the day. Mod-max  A for balance when pt reaching to retrieve items from closet. She returned to w/c to dress, requiring increased time and rest breaks throughout.  She declined standing at sink to brush teeth, stating too fatigued and dizzy, "just not feeling right". All vitals WNL. She completed grooming tasks from w/c level at sink, noted R atazia and coordination deficits with fine and gross motor movements during set-up.  Pt assisted with making bed per request as part of her morning routine at home. Pt able to use B UEs to assist with folding blanket and doning pillow cases. Completed gaze stabilization exercises from seated position, x3 up/down and R/L. Pt required  VCs for technique of exercises throughout. She has limited cervical ROM for exercises. Stand pivot transfer to bed at end of session. Pt returned to supine at end of session to rest, left with all needs in reach and bed alarm on. Education provided thorughout session regarding energy conservation, fall risk, safety awareness, and d/c planning.   Session Two: Pt seen for OT session focusing on functional transfers, mobility, and HEP for vestibular deficits.  Pt in supine upon arrival, voiced having just received bowel stimulant and declining leaving the room as a result in case of need for toileting. Agreeable to tx session in room. Throughout session, pt transferred onto toilet via stand pivot with min A and mod A steadying support while pt completed 3/3 toileting steps.  Btwn trips to toilet, pt completed occulomotor- saccades as part of HEP. Completed occulomotor movement vertically, horizontally and diagonal up/down. Pt tolerated all exercises well and cont to report diminishing symptoms of dizziness. Pt left seated on toilet at end of session with hand off to NT.   Therapy Documentation Precautions:  Precautions Precautions: Fall Restrictions Weight Bearing Restrictions: No  See Function Navigator for Current Functional Status.   Therapy/Group: Individual Therapy  Tiffany Thornton L 05/10/2018, 6:56 AM

## 2018-05-10 NOTE — Progress Notes (Signed)
Physical Therapy Session Note  Patient Details  Name: Tiffany Thornton MRN: [id number] Date of Birth: [birth year]  Today's Date: 05/09/2018 PT Individual Time: 1510-1530   25 min   Short Term Goals: Week 1:  PT Short Term Goal 1 (Week 1): Pt will perform stand pivot transfers with LRAD with min A PT Short Term Goal 2 (Week 1): Pt will ambulate 50 feet with LRAD and min A PT Short Term Goal 3 (Week 1): Pt will perform 8 steps with min A  Skilled Therapeutic Interventions/Progress Updates:    Pt received sitting in WC and agreeable to PT. PT instructed pt in ambulatory transfer to bathroom with RW for urination and min-mod assist with cues for AD management. Pt performed hand hygiene and clothing management with min assist for standing balance. Gait training with RW x 49f with min assist from PT and constant cues for gaze stabilization to reduce dizziness. Pt returned to room and performed stand pivot transfer to bed with min assist and RW. Sit>supine completed with supervision assist, and left supine in bed with call bell in reach and all needs met.        Therapy Documentation Precautions:  Precautions Precautions: Fall Restrictions Weight Bearing Restrictions: No Pain: Pain Assessment Pain Score: faces: hurts a little.   See Function Navigator for Current Functional Status.   Therapy/Group: Individual Therapy  ALorie Phenix6/13/2019, 7:38 AM

## 2018-05-10 NOTE — Progress Notes (Signed)
Subjective/Complaints:  No issues overnite, mouth is less dry off  Scopolamine, speech still a little slurred ROS- neg CP, SOB, +nausea, no vomiting no abd pain  Objective: Vital Signs: Blood pressure (!) 172/74, pulse 72, temperature 98.3 F (36.8 C), temperature source Oral, resp. rate 15, height 5' 7"  (1.702 m), weight 65.1 kg (143 lb 8.3 oz), SpO2 91 %. No results found. Results for orders placed or performed during the hospital encounter of 05/07/18 (from the past 72 hour(s))  Glucose, capillary     Status: Abnormal   Collection Time: 05/07/18 10:08 PM  Result Value Ref Range   Glucose-Capillary 114 (H) 65 - 99 mg/dL   Comment 1 Notify RN   Magnesium     Status: None   Collection Time: 05/08/18  4:34 AM  Result Value Ref Range   Magnesium 2.0 1.7 - 2.4 mg/dL    Comment: Performed at [facility], [place] [address], [place], [place] [address]  Phosphorus     Status: None   Collection Time: 05/08/18  4:34 AM  Result Value Ref Range   Phosphorus 2.9 2.5 - 4.6 mg/dL    Comment: Performed at [facility] [address] [place], [place] [address]  CBC WITH DIFFERENTIAL     Status: Abnormal   Collection Time: 05/08/18  4:34 AM  Result Value Ref Range   WBC 9.3 4.0 - 10.5 K/uL   RBC 3.77 (L) 3.87 - 5.11 MIL/uL   Hemoglobin 11.7 (L) 12.0 - 15.0 g/dL   HCT 35.4 (L) 36.0 - 46.0 %   MCV 93.9 78.0 - 100.0 fL   MCH 31.0 26.0 - 34.0 pg   MCHC 33.1 30.0 - 36.0 g/dL   RDW 13.4 11.5 - 15.5 %   Platelets 338 150 - 400 K/uL   Neutrophils Relative % 64 %   Neutro Abs 5.9 1.7 - 7.7 K/uL   Lymphocytes Relative 18 %   Lymphs Abs 1.7 0.7 - 4.0 K/uL   Monocytes Relative 15 %   Monocytes Absolute 1.4 (H) 0.1 - 1.0 K/uL   Eosinophils Relative 1 %   Eosinophils Absolute 0.1 0.0 - 0.7 K/uL   Basophils Relative 1 %   Basophils Absolute 0.1 0.0 - 0.1 K/uL   Immature Granulocytes 1 %   Abs Immature Granulocytes 0.1 0.0 - 0.1 K/uL    Comment: Performed at [facility],  [address], [place], [place] [address]  Comprehensive metabolic panel     Status: Abnormal   Collection Time: 05/08/18  4:34 AM  Result Value Ref Range   Sodium 139 135 - 145 mmol/L   Potassium 3.3 (L) 3.5 - 5.1 mmol/L   Chloride 104 101 - 111 mmol/L   CO2 26 22 - 32 mmol/L   Glucose, Bld 149 (H) 65 - 99 mg/dL   BUN 9 6 - 20 mg/dL   Creatinine, Ser 0.93 0.44 - 1.00 mg/dL   Calcium 8.5 (L) 8.9 - 10.3 mg/dL   Total Protein 4.7 (L) 6.5 - 8.1 g/dL   Albumin 2.4 (L) 3.5 - 5.0 g/dL   AST 32 15 - 41 U/L   ALT 26 14 - 54 U/L   Alkaline Phosphatase 50 38 - 126 U/L   Total Bilirubin 0.4 0.3 - 1.2 mg/dL   GFR calc non Af Amer 55 (L) >60 mL/min   GFR calc Af Amer >60 >60 mL/min    Comment: (NOTE) The eGFR has been calculated using the CKD EPI  equation. This calculation has not been validated in all clinical situations. eGFR's persistently <60 mL/min signify possible Chronic Kidney Disease.    Anion gap 9 5 - 15    Comment: Performed at [facility] [address] [place], [place] [address]  Glucose, capillary     Status: Abnormal   Collection Time: 05/08/18  7:07 AM  Result Value Ref Range   Glucose-Capillary 102 (H) 65 - 99 mg/dL   Comment 1 Notify RN   Glucose, capillary     Status: Abnormal   Collection Time: 05/08/18 12:12 PM  Result Value Ref Range   Glucose-Capillary 118 (H) 65 - 99 mg/dL  Glucose, capillary     Status: None   Collection Time: 05/08/18  4:59 PM  Result Value Ref Range   Glucose-Capillary 94 65 - 99 mg/dL  Glucose, capillary     Status: Abnormal   Collection Time: 05/08/18  9:04 PM  Result Value Ref Range   Glucose-Capillary 110 (H) 65 - 99 mg/dL  Glucose, capillary     Status: Abnormal   Collection Time: 05/09/18  6:21 AM  Result Value Ref Range   Glucose-Capillary 104 (H) 65 - 99 mg/dL  Glucose, capillary     Status: None   Collection Time: 05/09/18 11:42 AM  Result Value Ref Range   Glucose-Capillary 88 65 - 99 mg/dL  Glucose, capillary      Status: None   Collection Time: 05/09/18  4:38 PM  Result Value Ref Range   Glucose-Capillary 80 65 - 99 mg/dL  Urinalysis, Routine w reflex microscopic     Status: Abnormal   Collection Time: 05/09/18  8:13 PM  Result Value Ref Range   Color, Urine STRAW (A) YELLOW   APPearance CLEAR CLEAR   Specific Gravity, Urine 1.006 1.005 - 1.030   pH 8.0 5.0 - 8.0   Glucose, UA NEGATIVE NEGATIVE mg/dL   Hgb urine dipstick NEGATIVE NEGATIVE   Bilirubin Urine NEGATIVE NEGATIVE   Ketones, ur NEGATIVE NEGATIVE mg/dL   Protein, ur NEGATIVE NEGATIVE mg/dL   Nitrite NEGATIVE NEGATIVE   Leukocytes, UA NEGATIVE NEGATIVE    Comment: Performed at [facility], [place] [address], [place], [place] [address]  Glucose, capillary     Status: Abnormal   Collection Time: 05/09/18 10:07 PM  Result Value Ref Range   Glucose-Capillary 122 (H) 65 - 99 mg/dL  Basic metabolic panel     Status: Abnormal   Collection Time: 05/10/18  4:30 AM  Result Value Ref Range   Sodium 140 135 - 145 mmol/L   Potassium 4.7 3.5 - 5.1 mmol/L    Comment: NO VISIBLE HEMOLYSIS   Chloride 104 101 - 111 mmol/L   CO2 29 22 - 32 mmol/L   Glucose, Bld 92 65 - 99 mg/dL   BUN 17 6 - 20 mg/dL   Creatinine, Ser 0.95 0.44 - 1.00 mg/dL   Calcium 8.5 (L) 8.9 - 10.3 mg/dL   GFR calc non Af Amer 54 (L) >60 mL/min   GFR calc Af Amer >60 >60 mL/min    Comment: (NOTE) The eGFR has been calculated using the CKD EPI equation. This calculation has not been validated in all clinical situations. eGFR's persistently <60 mL/min signify possible Chronic Kidney Disease.    Anion gap 7 5 - 15    Comment: Performed at [facility] [address] [place], [place] [address]  Glucose, capillary     Status: None   Collection  Time: 05/10/18  6:28 AM  Result Value Ref Range   Glucose-Capillary 96 65 - 99 mg/dL     HEENT: normal Cardio: RRR and no murmur Resp: CTA B/L and Unlabored GI: BS positive and NT, ND Extremity:  No Edema Skin:    Bruise diffuse ecchymosis in BUE and BLE Neuro: Alert/Oriented, Anxious, Normal Sensory, Normal Motor, Abnormal FMC Ataxic/ dec FMC and Other 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 Physiatrist is providing close team supervision and 24 hour management of active medical problems listed below. Physiatrist and rehab team continue to assess barriers to discharge/monitor patient progress toward functional and medical goals. FIM: Function - Bathing Bathing activity did not occur: Refused Position: Wheelchair/chair at sink Body parts bathed by patient: Right arm, Left arm, Chest, Abdomen, Front perineal area, Buttocks, Right upper leg, Left upper leg Body parts bathed by helper: Right lower leg, Left lower leg, Back Assist Level: Touching or steadying assistance(Pt > 75%)  Function- Upper Body Dressing/Undressing Upper body dressing/undressing activity did not occur: Refused What is the patient wearing?: Pull over shirt/dress Pull over shirt/dress - Perfomed by patient: Thread/unthread right sleeve, Thread/unthread left sleeve, Put head through opening, Pull shirt over trunk Assist Level: Supervision or verbal cues, Set up Set up : To obtain clothing/put away Function - Lower Body Dressing/Undressing Lower body dressing/undressing activity did not occur: Refused What is the patient wearing?: Underwear, Pants, Maryln Manuel, Shoes Position: Education officer, museum at [employer] - Performed by patient: Thread/unthread right underwear leg, Pull underwear up/down Underwear - Performed by helper: Thread/unthread left underwear leg Pants- Performed by patient: Thread/unthread right pants leg, Pull pants up/down Pants- Performed  by helper: Thread/unthread left pants leg Shoes - Performed by patient: Don/doff left shoe, Fasten left Shoes - Performed by helper: Don/doff right shoe, Fasten right TED Hose - Performed by helper: Don/doff right TED hose, Don/doff left TED hose Assist for footwear: Partial/moderate assist Assist for lower body dressing: Touching or steadying assistance (Pt > 75%)  Function - Toileting Toileting steps completed by patient: Adjust clothing prior to toileting, Adjust clothing after toileting, Performs perineal hygiene Toileting steps completed by helper: Adjust clothing after toileting, Performs perineal hygiene, Adjust clothing prior to toileting Toileting Assistive Devices: Grab bar or rail Assist level: Touching or steadying assistance (Pt.75%)  Function - Air cabin crew transfer assistive device: Grab bar Assist level to toilet: Touching or steadying assistance (Pt > 75%) Assist level from toilet: Moderate assist (Pt 50 - 74%/lift or lower)  Function - Chair/bed transfer Chair/bed transfer method: Stand pivot Chair/bed transfer assist level: Touching or steadying assistance (Pt > 75%) Chair/bed transfer assistive device: Walker, Armrests Chair/bed transfer details: Verbal cues for sequencing, Tactile cues for sequencing, Verbal cues for safe use of DME/AE  Function - Locomotion: Wheelchair Will patient use wheelchair at discharge?: Yes Type: Manual Function - Locomotion: Ambulation Assistive device: Walker-rolling Max distance: 41f Assist level: Moderate assist (Pt 50 - 74%) Assist level: Moderate assist (Pt 50 - 74%)  Function - Comprehension Comprehension: Auditory Comprehension assist level: Follows basic conversation/direction with extra time/assistive device  Function - Expression Expression: Verbal Expression assist level: Expresses basic 75 - 89% of the time/requires cueing 10 - 24% of the time. Needs helper to occlude  trach/needs to repeat  words.  Function - Social Interaction Social Interaction assist level: Interacts appropriately 75 - 89% of the time - Needs redirection for appropriate language or to initiate interaction.  Function - Problem Solving Problem solving assist level: Solves basic 75 - 89% of the time/requires cueing 10 - 24% of the time  Function - Memory Memory assist level: Recognizes or recalls 75 - 89% of the time/requires cueing 10 - 24% of the time Patient normally able to recall (first 3 days only): That he or she is in a hospital  Medical Problem List and Plan: 1.  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 CIR PT, OT, SLP, ELOS ~2wk      2.  DVT Prophylaxis/Anticoagulation: Pharmaceutical: Heparin 5000U Q 12h 3. Pain Management: hydrocodone prn for pain. Discontinue dilaudid.  4. Mood: LCSW to follow for evaluation and support.  5. Neuropsych: This patient is not fully capable of making decisions on her own behalf. 6. Skin/Wound Care: Monitor wound for healing. Maintain adequate nutritional and hydration status.  7. Fluids/Electrolytes/Nutrition: Monitor I/O. Check lytes in am.fair intake ~50% meals yesterday D/c central line nausea stable off scopolamine 8. HTN: Monitor BP bid and continue to titrate medications for tighter control as still labile. Will add catapres for prn use.  Apresoline 66m TID, lisinopril 28mBID Vitals:   05/09/18 1935 05/10/18 0423  BP: (!) 190/68 (!) 172/74  Pulse: 77 72  Resp: 18 15  Temp: 99 F (37.2 C) 98.3 F (36.8 C)  SpO2: 93% 91%  good control 9. Low calorie malnutrition:  Check magnesium/phosphorus levels in am. Add protein supplements.  10. Pre diabetes: Hgb A1C- 5.8  11. Hypokalemia: Supplement today with Kdur and Mag Ox. Improved 6/13 12. Dizziness:Central vestibular dysfunction overall improving   14.  Per son pt has more slurring of speech and personality changes- d/c scopolamine  UA  negative LOS (Days) 3 A FACE TO FACE EVALUATION WAS PERFORMED  AnCharlett Blake/13/2019, 8:05 AM

## 2018-05-10 NOTE — Progress Notes (Signed)
Speech Language Pathology Daily Session Note  Patient Details  Name: Tiffany Thornton MRN: [id number] Date of Birth: Jul 29, 1933  Today's Date: 05/10/2018 SLP Individual Time: [id number] SLP Individual Time Calculation (min): 40 min  Short Term Goals: Week 1: SLP Short Term Goal 1 (Week 1): Pt will demonstrate sustained attention to functional task for 10 minutes with mod A verbal cues for redirection. SLP Short Term Goal 2 (Week 1): Pt will demonstrate 2 safety precuations during functional tasks with Mod A verbal cues.  SLP Short Term Goal 3 (Week 1): Pt will utilize external memory aid to recall new, daily information with min A verbal cues.  SLP Short Term Goal 4 (Week 1): Pt will utilize speech intelligibility strategies at the sentence level with min A verbal cues to achieve 80% intelligibility.  SLP Short Term Goal 5 (Week 1): Pt will utilize speech intelligibility strategies at the sentence level with min A verbal cues to achieve 80% intelligibility.   Skilled Therapeutic Interventions:  Pt was seen for skilled ST targeting communication goals.  Pt was received resting in bed but was awakened easily to voice.  Pt requested to use the bathroom and needed only 2 cues to lock brakes on wheelchair before moving when transferring on and off commode.  Pt became tearful during session saying that she doesn't have much interest in talking to people or being around people since her stroke.  SLP provided encouragement and reassurance.  Pt's speech intelligibility is much improved today in comparison to yesterday's therapy session with only intermittent supervision cues needed to slow rate and overarticulate.  Pt could recall strategies discussed during yesterday's session with supervision question cues.  Pt reports improving xerostomia which is likely the cause of improved articulation.  Pt was returned to bed and left with bed alarm set and call bell within reach.  Continue per current plan of care.      Function:  Eating Eating                 Cognition Comprehension Comprehension assist level: Follows basic conversation/direction with extra time/assistive device  Expression   Expression assist level: Expresses basic 90% of the time/requires cueing < 10% of the time.  Social Interaction Social Interaction assist level: Interacts appropriately 90% of the time - Needs monitoring or encouragement for participation or interaction.  Problem Solving Problem solving assist level: Solves basic 90% of the time/requires cueing < 10% of the time  Memory Memory assist level: Recognizes or recalls 90% of the time/requires cueing < 10% of the time    Pain Pain Assessment Pain Scale: 0-10 Pain Score: 0-No pain  Therapy/Group: Individual Therapy  Malaia Buchta, Melanee SpryNicole L 05/10/2018, 11:21 AM

## 2018-05-10 NOTE — Progress Notes (Signed)
Social Work Patient ID: Tiffany Thornton, female   DOB: 19-Jun-1933, 84 y.o.   MRN: [id number]   CSW continues to attempt to visit with pt to complete assessment, but multiple times pt was unavailable.  Will try again tomorrow.

## 2018-05-10 NOTE — Plan of Care (Signed)
  Problem: RH SAFETY Goal: RH STG ADHERE TO SAFETY PRECAUTIONS W/ASSISTANCE/DEVICE Description STG Adhere to Safety Precautions With min-mod Assistance/Device.  Outcome: Progressing  Call light within reach, bed/chair alarm, proper footwear

## 2018-05-11 ENCOUNTER — Inpatient Hospital Stay (HOSPITAL_COMMUNITY): Payer: Medicare Other | Admitting: Physical Therapy

## 2018-05-11 ENCOUNTER — Inpatient Hospital Stay (HOSPITAL_COMMUNITY): Payer: Medicare Other | Admitting: Speech Pathology

## 2018-05-11 ENCOUNTER — Inpatient Hospital Stay (HOSPITAL_COMMUNITY): Payer: Medicare Other | Admitting: Occupational Therapy

## 2018-05-11 LAB — GLUCOSE, CAPILLARY
GLUCOSE-CAPILLARY: 121 mg/dL — AB (ref 65–99)
GLUCOSE-CAPILLARY: 143 mg/dL — AB (ref 65–99)
Glucose-Capillary: 91 mg/dL (ref 65–99)
Glucose-Capillary: 100 mg/dL — ABNORMAL HIGH (ref 65–99)

## 2018-05-11 MED ORDER — HYDROCHLOROTHIAZIDE 12.5 MG PO CAPS
12.5000 mg | ORAL_CAPSULE | Freq: Every day | ORAL | Status: DC
  Administered 2018-05-11 – 2018-05-22 (×12): 12.5 mg via ORAL
  Filled 2018-05-11 (×12): qty 1

## 2018-05-11 NOTE — Progress Notes (Signed)
Occupational Therapy Session Note  Patient Details  Name: Tiffany Thornton MRN: [id number] Date of Birth: Mar 17, 1933  Today's Date: 05/11/2018 OT Individual Time: [id number] OT Individual Time Calculation (min): 71 min    Short Term Goals: Week 1:  OT Short Term Goal 1 (Week 1): Pt will complete 2 grooming tasks standing at sink with supervision to increase functional activity tolerance OT Short Term Goal 2 (Week 1): Pt will maintain sustained attention during 2 consecutive functional tasks with min cuing OT Short Term Goal 3 (Week 1): Pt will complete 3/3 toileting tasks with min steadying assist  Skilled Therapeutic Interventions/Progress Updates:    Pt seen for OT ADL bathing/dressing. Pt awake in supine upon arrival, agreeable to tx session and denying pain.  She cont to report dizziness is improving though requires increased time transitioning from supine> sitting EOB before ready to stand. She ambulated throughout sessoin with RW and CGA. Required mod A for functional transfers due to poor descentric control. She bathed seated on tub bench, standing with grab bars and steadying assist to complete pericare/buttock hygiene.  She returned to w/c to dress, required restbreaks throughout dressing task, able to self initiate. Pt very self critical and discouraged by lack of functional activity tolerance and how easily she fatigues. Rest breaks, encouragement and empathic listening provided throughout.  Rpovided pt with elastic hoselaces to assist with energy conservation. She demonstrates fine motor coordination tobe able to tye shoes, however, task is very fatiguing as it requires her to bend down towards floor, exerting increaed energy as well as risk of eliciting dizziness symptoms. Pt able to lace elastic laces with increased time. Pt incredibly fatigued throughout session, requiring significantly increased time during rest breaks throughout session. She was able to tolerate standing long enough  to complete oral care in stand. Returned to recliner to rest at end of session. Assisted with ordering breakfast tray she would like. Left with all needs in reach.  Cont with education throughout session regarding energy conservation, fall risk, functional implications of current deficits, and d/c planning.   Therapy Documentation Precautions:  Precautions Precautions: Fall Restrictions Weight Bearing Restrictions: (P) No Pain:   No/denies pain  See Function Navigator for Current Functional Status.   Therapy/Group: Individual Therapy  Minnie Legros L 05/11/2018, 6:51 AM

## 2018-05-11 NOTE — Progress Notes (Signed)
Speech Language Pathology Daily Session Note  Patient Details  Name: Tiffany Thornton MRN: [id number] Date of Birth: October 22, 1933  Today's Date: 05/11/2018 SLP Individual Time: 1405-1500 SLP Individual Time Calculation (min): 55 min  Short Term Goals: Week 1: SLP Short Term Goal 1 (Week 1): Pt will demonstrate sustained attention to functional task for 10 minutes with mod A verbal cues for redirection. SLP Short Term Goal 2 (Week 1): Pt will demonstrate 2 safety precuations during functional tasks with Mod A verbal cues.  SLP Short Term Goal 3 (Week 1): Pt will utilize external memory aid to recall new, daily information with min A verbal cues.  SLP Short Term Goal 4 (Week 1): Pt will utilize speech intelligibility strategies at the sentence level with min A verbal cues to achieve 80% intelligibility.   Skilled Therapeutic Interventions:  Pt was seen for skilled ST targeting cognitive goals.  Pt was in bed upon arrival, resting, but easily awakened to voice.  Pt was labile upon arrival stating "I just want to go home and be with the Lord."  SLP provided encouragement and offered examples of progress made while inpatient and pt was eventually agreeable to participate in therapy.  Pt ambulated to the bathroom with min assist.  No unsafe or impulsive behaviors noted during ambulation.  SLP facilitated the session with medication management tasks to address goals for attention to task, memory, and ongoing diagnostic treatment of problem solving.  Pt was unfamiliar with newly scheduled medications but could recall function and frequency of previously known medications with mod I.  Pt was provided with a list of currently scheduled medications to facilitate recall of new information.  Pt needed supervision cues to recognize and correct errors when loading pills into a pill box; however, errors were largely related to fine motor control deficits in the setting of ataxia and difficulty recognizing them appeared to  stem from visual changes post CVA.  Otherwise, pt approached task in a very thoughtful and organized manner. Pt was returned to room and left in bed with nurse tech at bedside.  Continue per current plan of care.    Function:  Eating Eating                 Cognition Comprehension Comprehension assist level: Follows basic conversation/direction with extra time/assistive device  Expression   Expression assist level: Expresses basic needs/ideas: With extra time/assistive device  Social Interaction Social Interaction assist level: Interacts appropriately 90% of the time - Needs monitoring or encouragement for participation or interaction.  Problem Solving Problem solving assist level: Solves basic 90% of the time/requires cueing < 10% of the time  Memory Memory assist level: Recognizes or recalls 90% of the time/requires cueing < 10% of the time    Pain Pain Assessment Pain Scale: 0-10 Pain Score: 0-No pain  Therapy/Group: Individual Therapy  Zamauri Nez, Melanee SpryNicole L 05/11/2018, 3:26 PM

## 2018-05-11 NOTE — Progress Notes (Signed)
Physical Therapy Session Note  Patient Details  Name: Tiffany Thornton MRN: [id number] Date of Birth: [birth year]  Today's Date: 05/11/2018 PT Individual Time: 1008-1106 PT Individual Time Calculation (min): 58 min   Short Term Goals: Week 1:  PT Short Term Goal 1 (Week 1): Pt will perform stand pivot transfers with LRAD with min A PT Short Term Goal 2 (Week 1): Pt will ambulate 50 feet with LRAD and min A PT Short Term Goal 3 (Week 1): Pt will perform 8 steps with min A  Skilled Therapeutic Interventions/Progress Updates:    Pt received in bed & agreeable to tx. Pt donned tennis shoes with min assist and extra time. No c/o pain reported. Pt transfers to EOB with supervision and hospital bed features, and completes stand pivot bed>w/c with min assist. Discussed gaze stabilization to help decreased dizziness with movement throughout session. Pt ambulates 95 ft with RW & min assist with pt reporting lightheadedness & blurred vision, BP = 155/51 mmHg. After resting pt reports she is still lightheaded and with continued blurred vision. Pt ambulates additional 93 ft with min assist. Pt performed standing step taps on 6" step with RUE support & min assist for balance with task focusing on weight shifting and BLE coordination. Pt propels w/c back to room with BUE & supervision with task focusing on BUE strengthening, RUE NMR via forced use, and endurance training. Pt reports need to use restroom and ambulates within room & bathroom with RW & min assist, performs clothing management & peri & hand hygiene without assistance, except min assist standing balance. At end of session pt returned to bed & left with alarm set, needs within reach & family present.   Pt reports fatigue throughout session, requiring encouragement to continue and seated rest breaks PRN.  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 Also educated pt on recommendation of  HHPT f/u.    Therapy Documentation Precautions:  Precautions Precautions: Fall Restrictions Weight Bearing Restrictions: (P) No    See Function Navigator for Current Functional Status.   Therapy/Group: Individual Therapy  Sandi MariscalVictoria M Carold Eisner 05/11/2018, 11:10 AM

## 2018-05-11 NOTE — Progress Notes (Signed)
[facility] Individual Statement of Services  Patient Name:  Tiffany Thornton  Date:  05/11/2018  Welcome to the [facility].  Our goal is to provide you with an individualized program based on your diagnosis and situation, designed to meet your specific needs.  With this comprehensive rehabilitation program, you will be expected to participate in at least 3 hours of rehabilitation therapies Monday-Friday, with modified therapy programming on the weekends.  Your rehabilitation program will include the following services:  Physical Therapy (PT), Occupational Therapy (OT), Speech Therapy (ST), 24 hour per day rehabilitation nursing, Therapeutic Recreaction (TR), Neuropsychology, Case Management (Social Worker), Rehabilitation Medicine, Nutrition Services and Pharmacy Services  Weekly team conferences will be held on Wednesdays to discuss your progress.  Your Social Worker will talk with you frequently to get your input and to update you on team discussions.  Team conferences with you and your family in attendance may also be held.  Expected length of stay:  2 weeks  Overall anticipated outcome:  Supervision  Depending on your progress and recovery, your program may change. Your Social Worker will coordinate services and will keep you informed of any changes. Your Social Worker's name and contact numbers are listed  below.  The following services may also be recommended but are not provided by the [facility]:   Driving Evaluations  Home Health Rehabiltiation Services  Outpatient Rehabilitation Services   Arrangements will be made to provide these services after discharge if needed.  Arrangements include referral to agencies that provide these services.  Your insurance has been verified to be:  [employer] Your primary doctor is:  Dr. Blair Heysobert Ehinger  Pertinent information will be shared with your doctor and your insurance  company.  Social Worker:  Staci AcostaJenny Zykia Walla, LCSW  [phone] or [phone]) [phone]  Information discussed with and copy given to patient by: Elvera LennoxPrevatt, [name], 05/11/2018, 2:24 PM

## 2018-05-11 NOTE — Plan of Care (Signed)
  Problem: RH SAFETY Goal: RH STG ADHERE TO SAFETY PRECAUTIONS W/ASSISTANCE/DEVICE Description STG Adhere to Safety Precautions With min-mod Assistance/Device.  Outcome: Progressing  Call light at hand, bed/chair alarm

## 2018-05-11 NOTE — Progress Notes (Signed)
Subjective/Complaints:  Tolerating therapy with OT  ROS- neg CP, SOB, +nausea, no vomiting no abd pain  Objective: Vital Signs: Blood pressure (!) 169/61, pulse 66, temperature 98.4 F (36.9 C), temperature source Oral, resp. rate 15, height 5' 7"  (1.702 m), weight 65.1 kg (143 lb 8.3 oz), SpO2 92 %. No results found. Results for orders placed or performed during the hospital encounter of 05/07/18 (from the past 72 hour(s))  Glucose, capillary     Status: Abnormal   Collection Time: 05/08/18 12:12 PM  Result Value Ref Range   Glucose-Capillary 118 (H) 65 - 99 mg/dL  Glucose, capillary     Status: None   Collection Time: 05/08/18  4:59 PM  Result Value Ref Range   Glucose-Capillary 94 65 - 99 mg/dL  Glucose, capillary     Status: Abnormal   Collection Time: 05/08/18  9:04 PM  Result Value Ref Range   Glucose-Capillary 110 (H) 65 - 99 mg/dL  Glucose, capillary     Status: Abnormal   Collection Time: 05/09/18  6:21 AM  Result Value Ref Range   Glucose-Capillary 104 (H) 65 - 99 mg/dL  Glucose, capillary     Status: None   Collection Time: 05/09/18 11:42 AM  Result Value Ref Range   Glucose-Capillary 88 65 - 99 mg/dL  Glucose, capillary     Status: None   Collection Time: 05/09/18  4:38 PM  Result Value Ref Range   Glucose-Capillary 80 65 - 99 mg/dL  Urinalysis, Routine w reflex microscopic     Status: Abnormal   Collection Time: 05/09/18  8:13 PM  Result Value Ref Range   Color, Urine STRAW (A) YELLOW   APPearance CLEAR CLEAR   Specific Gravity, Urine 1.006 1.005 - 1.030   pH 8.0 5.0 - 8.0   Glucose, UA NEGATIVE NEGATIVE mg/dL   Hgb urine dipstick NEGATIVE NEGATIVE   Bilirubin Urine NEGATIVE NEGATIVE   Ketones, ur NEGATIVE NEGATIVE mg/dL   Protein, ur NEGATIVE NEGATIVE mg/dL   Nitrite NEGATIVE NEGATIVE   Leukocytes, UA NEGATIVE NEGATIVE    Comment: Performed at [facility], [address], [place], [place] [address]  Urine Culture     Status: Abnormal  (Preliminary result)   Collection Time: 05/09/18  8:47 PM  Result Value Ref Range   Specimen Description URINE, CLEAN CATCH    Special Requests      NONE Performed at [facility], [place] [address], [place], [place] [address]    Culture 20,000 COLONIES/mL GRAM NEGATIVE RODS (A)    Report Status PENDING   Glucose, capillary     Status: Abnormal   Collection Time: 05/09/18 10:07 PM  Result Value Ref Range   Glucose-Capillary 122 (H) 65 - 99 mg/dL  Basic metabolic panel     Status: Abnormal   Collection Time: 05/10/18  4:30 AM  Result Value Ref Range   Sodium 140 135 - 145 mmol/L   Potassium 4.7 3.5 - 5.1 mmol/L    Comment: NO VISIBLE HEMOLYSIS   Chloride 104 101 - 111 mmol/L   CO2 29 22 - 32 mmol/L   Glucose, Bld 92 65 - 99 mg/dL   BUN 17 6 - 20 mg/dL   Creatinine, Ser 0.95 0.44 - 1.00 mg/dL   Calcium 8.5 (L) 8.9 - 10.3 mg/dL   GFR calc non Af Amer 54 (L) >60 mL/min   GFR calc Af Amer >60 >60 mL/min    Comment: (NOTE) The eGFR has been calculated using  the CKD EPI equation. This calculation has not been validated in all clinical situations. eGFR's persistently <60 mL/min signify possible Chronic Kidney Disease.    Anion gap 7 5 - 15    Comment: Performed at [facility] [address] [place], [place] [address]  Glucose, capillary     Status: None   Collection Time: 05/10/18  6:28 AM  Result Value Ref Range   Glucose-Capillary 96 65 - 99 mg/dL  Glucose, capillary     Status: None   Collection Time: 05/10/18 11:28 AM  Result Value Ref Range   Glucose-Capillary 78 65 - 99 mg/dL  Glucose, capillary     Status: None   Collection Time: 05/10/18  4:50 PM  Result Value Ref Range   Glucose-Capillary 85 65 - 99 mg/dL  Glucose, capillary     Status: Abnormal   Collection Time: 05/10/18  9:01 PM  Result Value Ref Range   Glucose-Capillary 148 (H) 65 - 99 mg/dL  Glucose, capillary     Status: Abnormal   Collection Time: 05/11/18  6:59 AM  Result Value Ref Range    Glucose-Capillary 121 (H) 65 - 99 mg/dL     HEENT: normal Cardio: RRR and no murmur Resp: CTA B/L and Unlabored GI: BS positive and NT, ND Extremity:  No Edema Skin:   Bruise diffuse ecchymosis in BUE and BLE Neuro: Alert/Oriented, Anxious, Normal Sensory, Normal Motor, Abnormal FMC Ataxic/ dec FMC and Other 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 Physiatrist is providing close team supervision and 24 hour management of active medical problems listed below. Physiatrist and rehab team continue to assess barriers to discharge/monitor patient progress toward functional and medical goals. FIM: Function - Bathing Bathing activity did not occur: Refused Position: Wheelchair/chair at sink Body parts bathed by patient: Right arm, Left arm, Chest, Abdomen, Front perineal area, Buttocks, Right upper leg, Left upper leg Body parts bathed by helper: Right lower leg, Left lower leg, Back Assist Level: Touching or steadying assistance(Pt > 75%)  Function- Upper Body Dressing/Undressing Upper body dressing/undressing activity did not occur: Refused What is the patient wearing?: Pull over shirt/dress Pull over shirt/dress - Perfomed by patient: Thread/unthread right sleeve, Thread/unthread left sleeve, Put head through opening, Pull shirt over trunk Assist Level: Touching or steadying assistance(Pt > 75%) Set up : To obtain clothing/put away Function - Lower Body Dressing/Undressing Lower body dressing/undressing activity did not occur: Refused What is the patient wearing?: Underwear, Pants, Maryln Manuel, Shoes Position: Education officer, museum at [employer] - Performed by patient: Thread/unthread right underwear leg, Pull  underwear up/down Underwear - Performed by helper: Thread/unthread left underwear leg Pants- Performed by patient: Thread/unthread right pants leg, Pull pants up/down Pants- Performed by helper: Thread/unthread left pants leg Shoes - Performed by patient: Don/doff left shoe, Fasten left Shoes - Performed by helper: Don/doff right shoe, Fasten right TED Hose - Performed by helper: Don/doff right TED hose, Don/doff left TED hose Assist for footwear: Partial/moderate assist Assist for lower body dressing: Touching or steadying assistance (Pt > 75%)  Function - Toileting Toileting steps completed by patient: Adjust clothing prior to toileting, Adjust clothing after toileting, Performs perineal hygiene Toileting steps completed by helper: Adjust clothing after toileting, Performs perineal hygiene,  Adjust clothing prior to toileting Toileting Assistive Devices: Grab bar or rail Assist level: Touching or steadying assistance (Pt.75%)  Function - Toilet Transfers Toilet transfer assistive device: Grab bar Assist level to toilet: Touching or steadying assistance (Pt > 75%) Assist level from toilet: Moderate assist (Pt 50 - 74%/lift or lower)  Function - Chair/bed transfer Chair/bed transfer method: Stand pivot Chair/bed transfer assist level: Touching or steadying assistance (Pt > 75%) Chair/bed transfer assistive device: Walker, Armrests Chair/bed transfer details: Verbal cues for sequencing, Tactile cues for sequencing, Verbal cues for safe use of DME/AE  Function - Locomotion: Wheelchair Will patient use wheelchair at discharge?: Yes Type: Manual Function - Locomotion: Ambulation Assistive device: Walker-rolling Max distance: 82f Assist level: Moderate assist (Pt 50 - 74%) Assist level: Moderate assist (Pt 50 - 74%)  Function - Comprehension Comprehension: Auditory Comprehension assist level: Follows basic conversation/direction with extra time/assistive device  Function -  Expression Expression: Verbal Expression assist level: Expresses basic 90% of the time/requires cueing < 10% of the time.  Function - Social Interaction Social Interaction assist level: Interacts appropriately 90% of the time - Needs monitoring or encouragement for participation or interaction.  Function - Problem Solving Problem solving assist level: Solves basic 90% of the time/requires cueing < 10% of the time  Function - Memory Memory assist level: Recognizes or recalls 90% of the time/requires cueing < 10% of the time Patient normally able to recall (first 3 days only): That he or she is in a hospital  Medical Problem List and Plan: 1.  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 CIR PT, OT, SLP, ELOS ~2wk      2.  DVT Prophylaxis/Anticoagulation: Pharmaceutical: Heparin 5000U Q 12h 3. Pain Management: hydrocodone prn for pain. Discontinue dilaudid.  4. Mood: LCSW to follow for evaluation and support.  5. Neuropsych: This patient is not fully capable of making decisions on her own behalf. 6. Skin/Wound Care: Monitor wound for healing. Maintain adequate nutritional and hydration status.  7. Fluids/Electrolytes/Nutrition: Monitor I/O. Check lytes in am.I 7264m6/13  8. HTN: Monitor BP bid and continue to titrate medications for tighter control as still labile. Will add catapres for prn use.  Apresoline 5037mID, lisinopril 83m27mD Vitals:   05/10/18 2043 05/11/18 0506  BP: (!) 160/62 (!) 169/61  Pulse: 75 66  Resp: 14 15  Temp: 98.3 F (36.8 C) 98.4 F (36.9 C)  SpO2: 93% 80% 22%stolic elevation 6/141/79 HCTZ 12.5mg 38m6/14 9. Low calorie malnutrition:  Check magnesium/phosphorus levels in am. Add protein supplements.  10. Pre diabetes: Hgb A1C- 5.8  11. Hypokalemia: Supplement today with Kdur and Mag Ox. Improved 6/13 12. Dizziness:Central vestibular dysfunction overall improving symptoms worsen with head  movements  LOS (Days) 4 A FACE TO FACE EVALUATION WAS PERFORMED  AndreCharlett Blake/2019, 7:49 AM

## 2018-05-12 ENCOUNTER — Inpatient Hospital Stay (HOSPITAL_COMMUNITY): Payer: Medicare Other | Admitting: Speech Pathology

## 2018-05-12 ENCOUNTER — Inpatient Hospital Stay (HOSPITAL_COMMUNITY): Payer: Medicare Other | Admitting: Physical Therapy

## 2018-05-12 ENCOUNTER — Inpatient Hospital Stay (HOSPITAL_COMMUNITY): Payer: Medicare Other

## 2018-05-12 LAB — URINE CULTURE

## 2018-05-12 LAB — GLUCOSE, CAPILLARY
GLUCOSE-CAPILLARY: 106 mg/dL — AB (ref 65–99)
GLUCOSE-CAPILLARY: 153 mg/dL — AB (ref 65–99)
Glucose-Capillary: 96 mg/dL (ref 65–99)
Glucose-Capillary: 119 mg/dL — ABNORMAL HIGH (ref 65–99)

## 2018-05-12 MED ORDER — METOPROLOL TARTRATE 12.5 MG HALF TABLET
12.5000 mg | ORAL_TABLET | Freq: Two times a day (BID) | ORAL | Status: DC
  Administered 2018-05-12 – 2018-05-21 (×19): 12.5 mg via ORAL
  Filled 2018-05-12 (×19): qty 1

## 2018-05-12 NOTE — Progress Notes (Signed)
Physical Therapy Session Note  Patient Details  Name: Tiffany Thornton MRN: [id number] Date of Birth: 1933-08-22  Today's Date: 05/12/2018 PT Individual Time: 1000-1100 PT Individual Time Calculation (min): 60 min   Short Term Goals: Week 1:  PT Short Term Goal 1 (Week 1): Pt will perform stand pivot transfers with LRAD with min A PT Short Term Goal 2 (Week 1): Pt will ambulate 50 feet with LRAD and min A PT Short Term Goal 3 (Week 1): Pt will perform 8 steps with min A  Skilled Therapeutic Interventions/Progress Updates:    Pt received supine in bed, agreeable to PT. No complaints of pain but pt does report some dizziness and her "head spinning", subsides with supine rest break. Bed mobility Supervision. Sit to stand Supervision to RW with v/c for safe hand placement during transfer. Ambulation with RW to bathroom with min A for balance. Toilet transfer with SBA with use of RW and grab bar, pt is able to complete 3/3 toileting steps with SBA. Ambulation x 200 ft, x 100 ft with RW and min A, scissoring with RLE and decreased clearance. Pt has no onset of dizziness with gait. Focus on heel/toe with RLE during gait. Ascend/descend 4 stairs with 2 handrails and min A. Standing R/L alt toe taps on 1" step with colored dot targets for improved LE coordination. Side-steps with BUE support and min A, increase in dizziness, resolves with seated rest break. Pt becomes discouraged during therapy session with onset of visual symptoms and dizziness, educated her to continue her eye exercises and that improvement will take time and continued therapy following her stay on rehab. Pt reports she does not want to rely on her family or her church members to take her to appointments once she goes home. Encouraged pt to keep an open mind with regards to continuing therapy once she leaves the hospital and that it is ok to ask for help from family and others who offer to help. Pt left supine in bed with needs in reach, bed  alarm in place.  Therapy Documentation Precautions:  Precautions Precautions: Fall Restrictions Weight Bearing Restrictions: No  See Function Navigator for Current Functional Status.   Therapy/Group: Individual Therapy  Peter Congoaylor Torrian Canion, PT, DPT  05/12/2018, 12:29 PM

## 2018-05-12 NOTE — Progress Notes (Signed)
  Subjective/Complaints:  Patient complains of a throbbing head this morning.  Location is mostly posterior.  She denies any other changes.  No neurologic complaints.  Objective: Vital Signs: Blood pressure (!) 160/61, pulse 80, temperature 97.7 F (36.5 C), temperature source Oral, resp. rate 16, height 5\' 7"  (1.702 m), weight 136 lb 11 oz (62 kg), SpO2 95 %.  No acute distress.  HEENT exam atraumatic, normocephalic, extraocular muscles are intact.  Neck is supple.  Chest clear to auscultation.  Cardiac exam S1-S2 regular.  Abdominal exam active bowel sounds, soft.  Extremities no edema.  Elderly female lying in bed.  Assessment/Plan: 1. 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 CIR PT, OT, SLP, ELOS ~2wk       2.  DVT Prophylaxis/Anticoagulation: Pharmaceutical: Heparin 5000U Q 12h 3. Pain Management: hydrocodone prn for pain.  Still complains of discomfort.  But will continue hydrocodone for now without any change. 4. Mood: LCSW to follow for evaluation and support.  5. Neuropsych: This patient is not fully capable of making decisions on her own behalf. 6. Skin/Wound Care: Monitor wound for healing. Maintain adequate nutritional and hydration status.  7. Fluids/Electrolytes/Nutrition: Monitor I/O.  Basic Metabolic Panel:    Component Value Date/Time   NA 140 05/10/2018 0430   K 4.7 05/10/2018 0430   CL 104 05/10/2018 0430   CO2 29 05/10/2018 0430   BUN 17 05/10/2018 0430   CREATININE 0.95 05/10/2018 0430   GLUCOSE 92 05/10/2018 0430   CALCIUM 8.5 (L) 05/10/2018 0430     8. HTN: Monitor BP  Vitals:   05/12/18 0515 05/12/18 0736  BP: (!) 173/57 (!) 160/61  Pulse: 69 80  Resp: 20 16  Temp: 97.7 F (36.5 C)   SpO2: 93% 95%  Needs improved control.  I doubt that hypertension is contributing  to her headache but it could be. She is currently on amlodipine, hydralazine, hydrochlorothiazide, lisinopril and is on a Catapres tablet as needed.  I will add a low-dose beta-blocker. 9. Low calorie malnutrition:  Check magnesium/phosphorus levels in am. Add protein supplements.  10. Pre diabetes: Hgb A1C- 5.8  11. Hypokalemia: Resolved. 12. Dizziness: No complaints on 05/12/2018.  LOS (Days) 5 A FACE TO FACE EVALUATION WAS PERFORMED  Bruce H Swords 05/12/2018, 8:39 AM

## 2018-05-12 NOTE — Progress Notes (Signed)
Physical Therapy Session Note  Patient Details  Name: Tiffany Thornton MRN: [id number] Date of Birth: 13-May-1933  Today's Date: 05/12/2018 PT Individual Time: 1630-1650 PT Individual Time Calculation (min): 20 min   Short Term Goals: Week 1:  PT Short Term Goal 1 (Week 1): Pt will perform stand pivot transfers with LRAD with min A PT Short Term Goal 2 (Week 1): Pt will ambulate 50 feet with LRAD and min A PT Short Term Goal 3 (Week 1): Pt will perform 8 steps with min A  Skilled Therapeutic Interventions/Progress Updates:  Pt received supine in bed and agreeable to PT. Supine>sit transfer with supervision assist and min cues. PT instructed pt in gait training x 173f with RW and min assist. Min cues for gaze stabilization to reduce central origin vertigo as well as cues for increased step length with the R LE in turns to prevent LOB. Ambulatory transfer to toilet for urination with min assist from PT and UE support on RW. Pt able to preform gown management and hand hygiene following urination. Pt returned to room and performed ambulatory transfer to bed with RW and min assist. Sit>supine completed with supervision assist, and left supine in bed with call bell in reach and all needs met.        Therapy Documentation Precautions:  Precautions Precautions: Fall Restrictions Weight Bearing Restrictions: No Pain: Denies.   See Function Navigator for Current Functional Status.   Therapy/Group: Individual Therapy  ALorie Phenix6/15/2019, 5:52 PM

## 2018-05-12 NOTE — Progress Notes (Signed)
Speech Language Pathology Daily Session Note  Patient Details  Name: Tiffany Thornton MRN: [id number] Date of Birth: Apr 19, 1933  Today's Date: 05/12/2018 SLP Individual Time: 1130-1155 SLP Individual Time Calculation (min): 25 min  Short Term Goals: Week 1: SLP Short Term Goal 1 (Week 1): Pt will demonstrate sustained attention to functional task for 10 minutes with mod A verbal cues for redirection. SLP Short Term Goal 2 (Week 1): Pt will demonstrate 2 safety precuations during functional tasks with Mod A verbal cues.  SLP Short Term Goal 3 (Week 1): Pt will utilize external memory aid to recall new, daily information with min A verbal cues.  SLP Short Term Goal 4 (Week 1): Pt will utilize speech intelligibility strategies at the sentence level with min A verbal cues to achieve 80% intelligibility.   Skilled Therapeutic Interventions:  Pt was seen for skilled ST targeting cognitive goals.  Pt was received in bed, reports feeling "worn out," but much more positive in comparison to previous therapy sessions.  SLP facilitated the session with a novel card game to address attention to task and recall of new information.  Pt was able to learn and carryover rules and procedures of game with mod I.   Pt sustained her attention to task for its duration (~20 min) with no cues needed for redirection.  Pt was left in bed with bed alarm set and visitors at bedside.  Continue per current plan of care.    Function:  Eating Eating                 Cognition Comprehension Comprehension assist level: Follows basic conversation/direction with extra time/assistive device  Expression   Expression assist level: Expresses basic needs/ideas: With extra time/assistive device  Social Interaction Social Interaction assist level: Interacts appropriately with others with medication or extra time (anti-anxiety, antidepressant).  Problem Solving Problem solving assist level: Solves complex problems: With extra time   Memory Memory assist level: More than reasonable amount of time    Pain Pain Assessment Pain Scale: 0-10 Pain Score: 0-No pain  Therapy/Group: Individual Therapy  Domanique Huesman, Melanee SpryNicole L 05/12/2018, 11:59 AM

## 2018-05-12 NOTE — Progress Notes (Signed)
Occupational Therapy Session Note  Patient Details  Name: Tiffany Thornton MRN: [id number] Date of Birth: 05/12/1933  Today's Date: 05/12/2018 OT Individual Time: 0800-0910 OT Individual Time Calculation (min): 70 min    Short Term Goals: Week 1:  OT Short Term Goal 1 (Week 1): Pt will complete 2 grooming tasks standing at sink with supervision to increase functional activity tolerance OT Short Term Goal 2 (Week 1): Pt will maintain sustained attention during 2 consecutive functional tasks with min cuing OT Short Term Goal 3 (Week 1): Pt will complete 3/3 toileting tasks with min steadying assist  Skilled Therapeutic Interventions/Progress Updates:    Pt sitting on toilet on arrival with NT present.  Pt stated she didn't want to shower this morning, preferring to wash up at sink.  Pt declined donning clothing stating that she didn't like to lay in bed with her clothes on and she wanted to return to bed when therapy session ended.  Pt amb with RW in room (steady A) to w/c at sink to complete bathing tasks and donning clean hospital gowns.  Pt able to doff hospital socks and on shoes without assistance.  Pt persistently commented on how exhausted she was and frequently asked why she was tired all the time.  Emotional support and explanation provided.  Pt transitioned to ADL apartment and engaged in functional amb with RW for simple home mgmt tasks and [facility] tasks while seated in w/c.  Pt noted with ataxic BUE movements but able to remove and replace caps to medicine bottles. Pt requested to return to bed to rest before next therapy session. Focus on discharge planning, functional amb with RW, standing balance, BADL retraining, and safety awareness to increase independence with BADLs.  Pt remained in bed with all needs within reach and bed alarm activated.   Therapy Documentation Precautions:  Precautions Precautions: Fall Restrictions Weight Bearing Restrictions: No Pain: Pt with no c/o of pain this  morning See Function Navigator for Current Functional Status.   Therapy/Group: Individual Therapy  Rich BraveLanier, Joniya Boberg Chappell 05/12/2018, 9:00 AM

## 2018-05-12 NOTE — Plan of Care (Signed)
  Problem: RH SAFETY Goal: RH STG ADHERE TO SAFETY PRECAUTIONS W/ASSISTANCE/DEVICE Description STG Adhere to Safety Precautions With min-mod Assistance/Device.  Outcome: Progressing  Continue to maintain, call light within reach. Bed/chair alarm, protective footwear

## 2018-05-13 LAB — GLUCOSE, CAPILLARY
GLUCOSE-CAPILLARY: 96 mg/dL (ref 65–99)
Glucose-Capillary: 93 mg/dL (ref 65–99)
Glucose-Capillary: 95 mg/dL (ref 65–99)
Glucose-Capillary: 174 mg/dL — ABNORMAL HIGH (ref 65–99)

## 2018-05-13 NOTE — Progress Notes (Signed)
  Subjective/Complaints:  Patient feels better this morning.  She still states that her head can feel foggy at times but there is really no discomfort.  She has no other concerns.  Objective: Vital Signs: Blood pressure (!) 147/53, pulse 63, temperature 97.6 F (36.4 C), temperature source Oral, resp. rate 17, height 5\' 7"  (1.702 m), weight 136 lb 11 oz (62 kg), SpO2 92 %. Elderly female lying in bed. No acute distress.  HEENT exam atraumatic, normocephalic, extraocular muscles are intact.  Posterior cervical and occipital surgical wound healing well.  Neck is supple.  Chest clear to auscultation.  Cardiac exam S1-S2 regular.  Abdominal exam active bowel sounds, soft.  Extremities no edema.    Assessment/Plan: 1. 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 Continue CIR     2.  DVT Prophylaxis/Anticoagulation: Pharmaceutical: Heparin 5000U Q 12h 3. Pain Management: pain seems to be improved. Ultram prn 4. Mood: LCSW to follow for evaluation and support.  5. Neuropsych: improving.  6. Skin/Wound Care: Monitor wound for healing. Maintain adequate nutritional and hydration status.  7. Fluids/Electrolytes/Nutrition: Monitor I/O.  Basic Metabolic Panel:    Component Value Date/Time   NA 140 05/10/2018 0430   K 4.7 05/10/2018 0430   CL 104 05/10/2018 0430   CO2 29 05/10/2018 0430   BUN 17 05/10/2018 0430   CREATININE 0.95 05/10/2018 0430   GLUCOSE 92 05/10/2018 0430   CALCIUM 8.5 (L) 05/10/2018 0430     8. HTN: Monitor BP  Vitals:   05/12/18 1948 05/13/18 0455  BP: (!) 162/54 (!) 147/53  Pulse: 68 63  Resp:  17  Temp:  97.6 F (36.4 C)  SpO2: 96% 92%  Needs improved control.  I doubt that hypertension is contributing to her headache but it could be. She is currently on amlodipine, hydralazine,  hydrochlorothiazide, lisinopril and is on a Catapres tablet as needed.  I will add a low-dose beta-blocker (6/15) Will monitor for now.  9. Low calorie malnutrition:  diet is improving 10. Pre diabetes: Hgb A1C- 5.8  11. Hypokalemia: Resolved. 12. Dizziness: No complaints on 05/13/2018.  LOS (Days) 6 A FACE TO FACE EVALUATION WAS PERFORMED  Jaria Conway H Andreana Klingerman 05/13/2018, 8:08 AM

## 2018-05-13 NOTE — Patient Care Conference (Signed)
Inpatient RehabilitationTeam Conference and Plan of Care Update Date: 05/09/2018   Time: 10:50 AM    Patient Name: Tiffany Thornton      Medical Record Number: [id number]  Date of Birth: September 01, 1933 Sex: Female         Room/Bed: 4W20C/4W20C-01 Payor Info: Payor: Advertising copywriter MEDICARE / Plan: [employer] MEDICARE / Product Type: *No Product type* /    Admitting Diagnosis: ICH  Admit Date/Time:  05/07/2018  5:04 PM Admission Comments: No comment available   Primary Diagnosis:  <principal problem not specified> Principal Problem: <principal problem not specified>  Patient Active Problem List   Diagnosis Date Noted  . Cerebellar hemorrhage, nontraumatic (HCC) 05/07/2018  . Ataxia, post-stroke   . Essential hypertension   . Prediabetes   . Hypokalemia   . Dizziness and giddiness   . Cognitive deficit, post-stroke   . Hypertensive emergency   . Hyperlipidemia   . Cerebellar edema (HCC)   . Leukocytosis   . S/P craniotomy 04/30/2018  . Left-sided nontraumatic intracerebral hemorrhage of cerebellum (HCC) 04/30/2018    Expected Discharge Date: Expected Discharge Date: (2 weeks)  Team Members Present: Physician leading conference: Dr. Claudette Laws Social Worker Present: Staci Acosta, LCSW Nurse Present: Other (comment)(Denise Sharon Seller, RN) PT Present: Aleda Grana, PT OT Present: Johnsie Cancel, OT SLP Present: Jackalyn Lombard, SLP PPS Coordinator present : Tora Duck, RN, CRRN     Current Status/Progress Goal Weekly Team Focus  Medical   Patient with nausea, poor appetite, blood pressure control issues, family thinks patient has more slurring of speech as well as some personality changes but not confusion  Maintain medical stability, improve intake  Discontinue central line if patient is able to maintain adequate p.o. intake   Bowel/Bladder   continent of bowel & bladder.  LBM was reported as 6/11, before that 6/2  remain continent & a regular bowel pattern  continue to monitor    Swallow/Nutrition/ Hydration             ADL's   Min-mod A dynamic standing balance; Steadying assist LB dressing and toileting; mod A functional transfers  Supervision overall  ADL re-training, R neuro re-ed, safety awareness, attention to task, functional standing balance/endurance, functional ambulation/RW management   Mobility             Communication   Mod A sentence level  Supervision A  speech intelligibility strategies   Safety/Cognition/ Behavioral Observations  Max-Mod A  Min-Supervision A  intellectual awareness, problem solving, safety awareness, recall and sustained attention   Pain   c/o pain at times to back & hips, headache, has tylenol and norco prn  pain scale <4/10  continue to assess & treat as needed   Skin   ecchymosis to BUE & BLE, incision to median posterior head with sutures, no drsg          Rehab Goals Patient on target to meet rehab goals: Yes Rehab Goals Revised: none *See Care Plan and progress notes for long and short-term goals.     Barriers to Discharge  Current Status/Progress Possible Resolutions Date Resolved   Physician    Medical stability;Other (comments)  Nausea secondary to cerebellar hemorrhage/infarct  Progressing towards goals  See above, continue rehab program      Nursing                  PT  Decreased caregiver support  OT                  SLP                SW                Discharge Planning/Teaching Needs:  Pt to return to her home with family to provide 24/7 supervision, but pt does not want this.  Family education to occur prior to d/c.   Team Discussion:  Pt with cerebellar bleed and infarct in the same area, requiring craniotomy. Pt has dizziness and nausea.  Son is questioning personality changes and slurred speech.  Pt could have central line removed soon once she is eating and drinking better.  Pt cannot use blood thinners and new stroke is not suspected.  Pt with need for Miralax and has  crying spells at night.  Pt is impulsive and has decreased awareness of deficits.  She can't sustain attention to anything and pt has 24/7 supervision level goals.   Revisions to Treatment Plan:  None - first conference    Continued Need for Acute Rehabilitation Level of Care: The patient requires daily medical management by a physician with specialized training in physical medicine and rehabilitation for the following conditions: Daily direction of a multidisciplinary physical rehabilitation program to ensure safe treatment while eliciting the highest outcome that is of practical value to the patient.: Yes Daily medical management of patient stability for increased activity during participation in an intensive rehabilitation regime.: Yes Daily analysis of laboratory values and/or radiology reports with any subsequent need for medication adjustment of medical intervention for : Neurological problems  Jionni Helming, Vista DeckJennifer Capps 05/13/2018, 11:19 AM

## 2018-05-13 NOTE — Progress Notes (Addendum)
Social Work Patient ID: Tiffany Thornton, female   DOB: 09-May-1933, 84 y.o.   MRN: [id number]   CSW met with pt, her dtr, her sister, and her granddtr 05-11-18 to update them on team conference discussion with expected LOS of 2 weeks.  CSW explained that pt would need 24/7 supervision for her safety, but pt did not understand the importance.  Dtr seemed to understand and CSW told pt we would discuss further, as her pastor also came in during visit.  CSW will continue to follow and assist as needed.

## 2018-05-14 ENCOUNTER — Inpatient Hospital Stay (HOSPITAL_COMMUNITY): Payer: Medicare Other | Admitting: Occupational Therapy

## 2018-05-14 ENCOUNTER — Inpatient Hospital Stay (HOSPITAL_COMMUNITY): Payer: Medicare Other

## 2018-05-14 ENCOUNTER — Inpatient Hospital Stay (HOSPITAL_COMMUNITY): Payer: Medicare Other | Admitting: Physical Therapy

## 2018-05-14 ENCOUNTER — Encounter (HOSPITAL_COMMUNITY): Payer: Medicare Other | Admitting: Psychology

## 2018-05-14 LAB — CBC
HCT: 35.5 % — ABNORMAL LOW (ref 36.0–46.0)
Hemoglobin: 11.8 g/dL — ABNORMAL LOW (ref 12.0–15.0)
MCH: 31.6 pg (ref 26.0–34.0)
MCHC: 33.2 g/dL (ref 30.0–36.0)
MCV: 95.2 fL (ref 78.0–100.0)
PLATELETS: 380 10*3/uL (ref 150–400)
RBC: 3.73 MIL/uL — ABNORMAL LOW (ref 3.87–5.11)
RDW: 13.8 % (ref 11.5–15.5)
WBC: 8.8 10*3/uL (ref 4.0–10.5)

## 2018-05-14 LAB — GLUCOSE, CAPILLARY
GLUCOSE-CAPILLARY: 104 mg/dL — AB (ref 65–99)
Glucose-Capillary: 104 mg/dL — ABNORMAL HIGH (ref 65–99)
Glucose-Capillary: 93 mg/dL (ref 65–99)
Glucose-Capillary: 94 mg/dL (ref 65–99)

## 2018-05-14 NOTE — Consult Note (Signed)
Neuropsychological Consultation   Patient:   Tiffany Thornton   DOB:   1933/08/23  MR Number:  [id number]  Location:  [facility] [id number] [place] [address] Dept: [phone] Loc: [phone]           Date of Service:   05/14/2018  Start Time:   11 AM End Time:   12 PM  Provider/Observer:  Arley Phenix, Psy.D.       Clinical Neuropsychologist       Billing Code/Service: [phone] 4 Units  Chief Complaint:    Tiffany Thornton is an 84 year old female with a history of hypothyroid condition but otherwise in good health.  The patient was admitted on 04/30/2018 with acute mental status changes as well as nausea, vomiting, and vertigo.  The time, the patient was noted to have hypertensive emergency with systolic blood pressure greater than 200.  CT the head was reviewed showing a right.  Large acute right cerebellar hemorrhage fourth ventricle and waxing.  Dr. Yetta Barre took her to the OR for suboccipital craniotomy and evacuation of the hemorrhage.  Postoperatively the patient continued with mild dysarthria and right ataxia.  The neurologist felt that the bleed was likely due to a hypertensive emergency.  Later MRI/MRA revealed a stable bleed with acute/subacute nonhemorrhagic infarct lateral to cerebral hemorrhage increasing mass-effect.  There was no hydrocephalus.  The patient was recommended for comprehensive inpatient rehabilitation services due to functional deficits.  During her stay and rehabilitative efforts the patient is repeatedly talking about how she feels so upset and frustrated by her loss of function following this cerebrovascular accident.  The patient reports that she was doing everything around her house and was quite independent.  The patient reports that she is not sure that she really wants to struggle through this and is "ready to go home to the [place]."  However, the patient denies any acute  active suicidal ideation or intent to harm herself.  Reason for Service:  The patient was referred for neuropsychological/psychological consultation due to coping and adjustment issues following a significant right cerebellar hemorrhage.  Below is the HPI for the current admission.  HPI: Tiffany Thornton is an 84 year old female with history of hypothyroid otherwise in good health; who was admitted on 04/30/18 with acute mental status changes with nausea, vomiting and vertigo.  History taken from chart review. She was noted to have hypertensive emergency with systolic blood pressure greater than 200 and CT of head done, reviewed, showing right cerebellar hemorrhage. Per report, large acute right cerebellar hemorrhage with  mass-effect on 4th ventricle and waxing an waning of symptoms. She was evaluated by Dr. Yetta Barre and taken to OR for suboccipital craniectomy and evacuation of hemorrhage.  Post op with mild dysarthria and right ataxia. 2 D echo showed EF 60-65% with grade 2 diastolic dysfunction and calcified mitral annulus with trivial regurgitation.   Dr. Roda Shutters felt that bleed due to hypertensive emergency and SBP goals < 160.  MRI/MRA brain done revealing stable bleed with acute/subacute nonhemorrhagic infarct lateral to cerebellar hemorrhage, decreasing mass effect and no hydrocephalus and multiple intracranial aneurysms.  Scopolamine patch added to help with vertigo and mobility improving. She continues to have dizziness with activity as well as poor safety awareness. CIR recommended due to functional deficits.    Current Status:  During the 1 hour visit today the patient describes how functional she was prior to this event.  She  kept repeating that she could not understand how something like this would happen.  We talked about her medical status and her hypertension.  The patient reports that she is fearful that she will not show improvement and be able to regain her independence.  She reports that she is  quite fearful that she will become a burden to her children and that her hospital treatment will be financially devastating to them.  We reviewed the issues that the patient is concerned about and address them directly.  The patient has in fact, shown significant improvement over the past couple of weeks and we reviewed some of the issues that the patient has improved in.   Behavioral Observation: Tiffany Thornton  presents as a 84 y.o.-year-old Right Caucasian Female who appeared her stated age. her dress was Appropriate and she was Well Groomed and her manners were Appropriate to the situation.  her participation was indicative of Appropriate and Redirectable behaviors.  There were any physical disabilities noted.  she displayed an appropriate level of cooperation and motivation.     Interactions:    Active Appropriate and Redirectable  Attention:   abnormal and attention span appeared shorter than expected for age  Memory:   abnormal; remote memory intact, recent memory impaired  Visuo-spatial:  not examined  Speech (Volume):  low  Speech:   normal; normal  Thought Process:  Coherent and Relevant  Though Content:  WNL; not suicidal and not homicidal  Orientation:   person, place, time/date and situation  Judgment:   Fair  Planning:   Fair  Affect:    Depressed  Mood:    Dysphoric  Insight:   Fair  Intelligence:   normal  Medical History:   Past Medical History:  Diagnosis Date  . Hyperlipidemia   . Hypothyroid    Psychiatric History:  The patient denies any prior history of psychiatric illness including denying a history of prior depression or anxiety.  Family Med/Psych History:  Family History  Problem Relation Age of Onset  . Diabetes Mother   . Lung cancer Father   . Diabetes Sister     Risk of Suicide/Violence: low the patient denies any active impulse to directly harm her self.  She reports that she has been increasingly anxious and fearful that she will be a  burden on her family and that her care will be very costly for them.  The patient reports that she is not having thoughts of directly harming herself.  Impression/DX:  Tiffany Thornton is an 84 year old female with a history of hypothyroid condition but otherwise in good health.  The patient was admitted on 04/30/2018 with acute mental status changes as well as nausea, vomiting, and vertigo.  The time, the patient was noted to have hypertensive emergency with systolic blood pressure greater than 200.  CT the head was reviewed showing a right.  Large acute right cerebellar hemorrhage fourth ventricle and waxing.  Dr. Yetta BarreJones took her to the OR for suboccipital craniotomy and evacuation of the hemorrhage.  Postoperatively the patient continued with mild dysarthria and right ataxia.  The neurologist felt that the bleed was likely due to a hypertensive emergency.  Later MRI/MRA revealed a stable bleed with acute/subacute nonhemorrhagic infarct lateral to cerebral hemorrhage increasing mass-effect.  There was no hydrocephalus.  The patient was recommended for comprehensive inpatient rehabilitation services due to functional deficits.  During her stay and rehabilitative efforts the patient is repeatedly talking about how she feels so upset and frustrated by  her loss of function following this cerebrovascular accident.  The patient reports that she was doing everything around her house and was quite independent.  The patient reports that she is not sure that she really wants to struggle through this and is "ready to go home to the [place]."  However, the patient denies any acute active suicidal ideation or intent to harm herself.  During the 1 hour visit today the patient describes how functional she was prior to this event.  She kept repeating that she could not understand how something like this would happen.  We talked about her medical status and her hypertension.  The patient reports that she is fearful that she will not show  improvement and be able to regain her independence.  She reports that she is quite fearful that she will become a burden to her children and that her hospital treatment will be financially devastating to them.  We reviewed the issues that the patient is concerned about and address them directly.  The patient has in fact, shown significant improvement over the past couple of weeks and we reviewed some of the issues that the patient has improved in.  Disposition/Plan:  Today we specifically worked on Producer, television/film/video and strategies in a better understanding and comprehension about what is happened in how to deal with and cope with current medical and physical issues resulting from her vascular accident.  The patient does not appear to be in danger of self-harm.  We will need to continue to address the improvement she is made and the purpose for her active participation in the rehabilitative program.  Diagnosis:    Nontraumatic intracranial hemorrhage        Electronically Signed   _______________________ Arley Phenix, Psy.D.

## 2018-05-14 NOTE — Progress Notes (Signed)
Social Work Assessment and Plan  Patient Details  Name: Tiffany Thornton MRN: [id number] Date of Birth: 12/01/1932  Today's Date: 05/11/2018  Problem List:  Patient Active Problem List   Diagnosis Date Noted  . Cerebellar hemorrhage, nontraumatic (HCC) 05/07/2018  . Ataxia, post-stroke   . Essential hypertension   . Prediabetes   . Hypokalemia   . Dizziness and giddiness   . Cognitive deficit, post-stroke   . Hypertensive emergency   . Hyperlipidemia   . Cerebellar edema ([place])   . Leukocytosis   . S/P craniotomy 04/30/2018  . Left-sided nontraumatic intracerebral hemorrhage of cerebellum ([place]) 04/30/2018   Past Medical History:  Past Medical History:  Diagnosis Date  . Hyperlipidemia   . Hypothyroid    Past Surgical History:  Past Surgical History:  Procedure Laterality Date  . ABDOMINAL HYSTERECTOMY    . APPENDECTOMY    . CHOLECYSTECTOMY    . CRANIOTOMY N/A 04/30/2018   Procedure: CRANIOTOMY HEMATOMA EVACUATION SUBDURAL;  Surgeon: Earnie Larsson, MD;  Location: [place];  Service: Neurosurgery;  Laterality: N/A;  suboccipital craniectomy for hematoma   Social History:  reports that she quit smoking about 5 weeks ago. Her smoking use included cigarettes. She started smoking about 40 years ago. She smoked 0.00 packs per day for 40.00 years. She uses smokeless tobacco. She reports that she does not drink alcohol or use drugs.  Family / Support Systems Marital Status: Widow/Widower Patient Roles: Parent, Other (Comment)(grandparent, sister) Children: Tiffany Thornton - son - [phone] (h); [phone] (m) Other Supports: dtr, sister, grandchildren Anticipated Caregiver: family Ability/Limitations of Caregiver: family work and can will arrange family support 24/7 initially, per Admissions Coordinator.  CSW to confirm after talking with son. 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) Legal History/Current Legal Issues: none reported Guardian/Conservator: MD has stated that pt is not fully capable of making her own decisions, but feels this is improving.   Abuse/Neglect Abuse/Neglect Assessment Can Be Completed: Yes Physical Abuse: Denies Verbal Abuse: Denies Sexual Abuse: Denies Exploitation of patient/patient's resources: Denies Self-Neglect: Denies  Emotional Status Pt's affect, behavior and adjustment status: Pt seems a bit frustrated by talk of needing soemone with her when she is discharged to home.  She quickly corrects this frustration with positive self talk that "everything is going to be ok." Recent Psychosocial Issues: none reported Psychiatric History: none reported Substance Abuse History: none reported  Patient / Family Perceptions, Expectations & Goals Pt/Family understanding of illness & functional limitations: Pt has a good understanding of what happened to her and her condition, but cannot fully appreciate her deficits yet and what this means for her going forward. Premorbid pt/family roles/activities: Pt loves to be outside in her yard.  This is her main activity/hobby and she must be outside. Anticipated changes in roles/activities/participation: Pt wants to resume yardwork, including push mowing her 2 acres.  Family has told her that would not be happening anymore.  She is clearly upset by this, but hopes to still be able to be outside a lot. Pt/family expectations/goals: Pt wants to get back in her yard.  Community Resources Express Scripts: None Premorbid Home Care/DME Agencies: None Transportation available at discharge: family Resource referrals recommended: Neuropsychology, Support group (specify)(stroke support group)  Discharge Planning Living Arrangements: Alone Support Systems: Children, Other relatives,  Friends/neighbors, Church/faith community Type of  Residence: Private residence Insurance Resources: Multimedia programmer (specify)([employer] Electrical engineer) Financial Resources: Social Security Financial Screen Referred: No Money Management: Patient Does the patient have any problems obtaining your medications?: No Home Management: Pt was taking care of her home. Patient/Family Preliminary Plans: Pt expects to go home by herself at d/c.  CSW began the conversation that pt needs someone with her 24/7 initially. Social Work Anticipated Follow Up Needs: HH/OP, Support Group Expected length of stay: 2 weeks  Clinical Impression CSW met with pt while her dtr, sister, and granddtr were visiting to introduce self and role of CSW, as well as to complete assessment.  Pt gave permission for CSW to talk with her with her family present, but when her pastor came in, CSW ended visit with pt.  Pt is used to being independent, does not take a lot of medications (and doesn't want to), and has strong opinions about both when discussing d/c.  Pt does not want (or think she needs) someone with her at home initially.  CSW told her that team was recommending 24/7 supervision and will have to re-emphasize this with pt and discuss it as needed.  Dtr nodded and seemed to agree with CSW/team's recommendation.  Pt is expected to be on CIR for 2 weeks.  CSW will continue to follow and assist as needed and will f/u with pt's son, who is also her POA.  CSW remains available, as needed.  Tiffany Thornton, Silvestre Mesi 05/14/2018, 12:34 AM

## 2018-05-14 NOTE — Progress Notes (Addendum)
Physical Therapy Session Note  Patient Details  Name: Tiffany Thornton MRN: [id number] Date of Birth: January 17, 1933  Today's Date: 05/14/2018 PT Individual Time: [id number] PT Individual Time Calculation (min): 56 min   Short Term Goals: Week 1:  PT Short Term Goal 1 (Week 1): Pt will perform stand pivot transfers with LRAD with min A PT Short Term Goal 2 (Week 1): Pt will ambulate 50 feet with LRAD and min A PT Short Term Goal 3 (Week 1): Pt will perform 8 steps with min A  Skilled Therapeutic Interventions/Progress Updates:  Pt received in bed & agreeable to tx, no c/o pain. Pt appears to have poor insight into deficits even with education regarding stroke and recovery. Therapist educates pt on need for 24 hr supervision upon d/c and pt reports she does not like being dependent upon other people but last week pt's great granddaughter reports she can stay with pt at night. Pt also reports "I can do enough to get by" upon return home despite ongoing education. Pt transfers out of bed with supervision and hospital bed features, pt continues to report lightheadedness with gait and requires min assist for stand pivot bed>w/c 2/2 decreased strength & impaired coordination. In gym pt negotiated obstacle course (3" step, stepping over poles, weaving between cones) with steady assist; pt with increased ataxia RUE compared to RLE and with impaired control of RW on R side due to deficits. Pt is able to negotiate step with min cuing and manages RW appropriately during obstacle course but requires cuing when turning to sit in w/c. Pt performed standing cone taps without BUE support with min/mod assist for standing balance with task focusing on weight shifting L<>R and BLE coordination with seated rest breaks in between 2/2 fatigue. Pt then becoming nauseous but no emesis noted, pt holding posterior R head. Pt returned to room & BP assessed: 184/65 mmHg, LUE, sitting. Pt's daughter in room & educated on recommendation of  24 hr assist upon d/c with daughter reporting the family can provide this. Pt requires min assist to return to bed 2/2 poor eccentric control with stand>sit. Pt left in bed with alarm set, needs within reach, family present. RN made aware of pt's status.  Pt appears to be depressed, making comments about going home with the [facility] with therapist providing encouragement. LCSW notified & pt on neuropsychologist's schedule today.   Therapy Documentation Precautions:  Precautions Precautions: Fall Restrictions Weight Bearing Restrictions: No    See Function Navigator for Current Functional Status.   Therapy/Group: Individual Therapy  Sandi MariscalVictoria M Marbella Markgraf 05/14/2018, 10:59 AM

## 2018-05-14 NOTE — Progress Notes (Signed)
Patient has refused her Premier Protein drinks. She feels they will elevate her blood sugars too much and doesn't want to drink them, even with RN education concerning protein intake vs. Carb intake.

## 2018-05-14 NOTE — Progress Notes (Addendum)
Speech Language Pathology Daily Session Note  Patient Details  Name: Tiffany Thornton MRN: [id number] Date of Birth: 1933/10/22  Today's Date: 05/14/2018 SLP Individual Time: 1345-1430 SLP Individual Time Calculation (min): 45 min  Short Term Goals: Week 1: SLP Short Term Goal 1 (Week 1): Pt will demonstrate sustained attention to functional task for 10 minutes with mod A verbal cues for redirection. SLP Short Term Goal 2 (Week 1): Pt will demonstrate 2 safety precuations during functional tasks with Mod A verbal cues.  SLP Short Term Goal 3 (Week 1): Pt will utilize external memory aid to recall new, daily information with min A verbal cues.  SLP Short Term Goal 4 (Week 1): Pt will utilize speech intelligibility strategies at the sentence level with min A verbal cues to achieve 80% intelligibility.   Skilled Therapeutic Interventions:Skilled ST services focused on cognitive skills. Pt demonstrated speech intelligibility at sentence level with min-supervision A verba cues and to consume sips of water to reduce symptoms of xerostomia. SLP facilitated semi-complex problem solving and recall utilizing ALFA daily math problems and deductive reasoning task, requiring min-supervision A verbal cues. SLP's treatment task were limited to pt' mobility, pt complained of neck pain, recently received heating pad and request to stay 50 degree angle. SLP reviewed current medications with pt utilizing medication list, pt required supervision A verbal cues to recall and verbally problem solve daily dose of medication. Pt demonstrated signs of depression, stating repeatitly she would not have to take all the medication when returning home, SLP provided education about the importance of continuing medication/prevention and pt expressed " I don't know why the good lord didn't take me " Pt is currently on neuropsychiatric schedule. Pt was left in room with call bell within reach. Reccomend to continue skilled ST services.        Function:  Eating Eating                 Cognition Comprehension Comprehension assist level: Follows basic conversation/direction with no assist  Expression   Expression assist level: Expresses basic needs/ideas: With extra time/assistive device  Social Interaction Social Interaction assist level: Interacts appropriately with others with medication or extra time (anti-anxiety, antidepressant).  Problem Solving Problem solving assist level: Solves complex 90% of the time/cues < 10% of the time;Solves complex problems: With extra time  Memory Memory assist level: More than reasonable amount of time    Pain Pain Assessment Pain Scale: 0-10 Pain Score: 0-No pain Pain Type: Acute pain Pain Location: Head Pain Descriptors / Indicators: Headache;Throbbing Pain Frequency: Intermittent Pain Onset: Gradual Pain Intervention(s): Medication (See eMAR)  Therapy/Group: Individual Therapy  Maguire Sime  [facility] Hospital[facility] 05/14/2018, 2:37 PM

## 2018-05-14 NOTE — Progress Notes (Signed)
Subjective/Complaints:  Discussed realistic goals of inpt rehab with pt.  Discussed that pt will not be driving or mowing lawn once she is back home again.Pt disappointed  ROS- neg CP, SOB, +nausea, no vomiting no abd pain  Objective: Vital Signs: Blood pressure (!) 138/48, pulse 63, temperature 97.8 F (36.6 C), temperature source Oral, resp. rate 18, height 5\' 7"  (1.702 m), weight 62 kg (136 lb 11 oz), SpO2 97 %. No results found. Results for orders placed or performed during the hospital encounter of 05/07/18 (from the past 72 hour(s))  Glucose, capillary     Status: Abnormal   Collection Time: 05/11/18  9:56 PM  Result Value Ref Range   Glucose-Capillary 100 (H) 65 - 99 mg/dL  Glucose, capillary     Status: None   Collection Time: 05/12/18  6:23 AM  Result Value Ref Range   Glucose-Capillary 96 65 - 99 mg/dL  Glucose, capillary     Status: Abnormal   Collection Time: 05/12/18 11:50 AM  Result Value Ref Range   Glucose-Capillary 106 (H) 65 - 99 mg/dL  Glucose, capillary     Status: Abnormal   Collection Time: 05/12/18  4:27 PM  Result Value Ref Range   Glucose-Capillary 153 (H) 65 - 99 mg/dL  Glucose, capillary     Status: Abnormal   Collection Time: 05/12/18  9:45 PM  Result Value Ref Range   Glucose-Capillary 119 (H) 65 - 99 mg/dL  Glucose, capillary     Status: None   Collection Time: 05/13/18  6:11 AM  Result Value Ref Range   Glucose-Capillary 93 65 - 99 mg/dL  Glucose, capillary     Status: None   Collection Time: 05/13/18 11:55 AM  Result Value Ref Range   Glucose-Capillary 95 65 - 99 mg/dL  Glucose, capillary     Status: Abnormal   Collection Time: 05/13/18  4:44 PM  Result Value Ref Range   Glucose-Capillary 174 (H) 65 - 99 mg/dL  Glucose, capillary     Status: None   Collection Time: 05/13/18  9:46 PM  Result Value Ref Range   Glucose-Capillary 96 65 - 99 mg/dL  CBC     Status: Abnormal   Collection Time: 05/14/18  5:32 AM  Result Value Ref Range    WBC 8.8 4.0 - 10.5 K/uL   RBC 3.73 (L) 3.87 - 5.11 MIL/uL   Hemoglobin 11.8 (L) 12.0 - 15.0 g/dL   HCT 16.1 (L) 09.6 - 04.5 %   MCV 95.2 78.0 - 100.0 fL   MCH 31.6 26.0 - 34.0 pg   MCHC 33.2 30.0 - 36.0 g/dL   RDW 40.9 81.1 - 91.4 %   Platelets 380 150 - 400 K/uL    Comment: Performed at [facility], [address] [place], [place] [address]  Glucose, capillary     Status: Abnormal   Collection Time: 05/14/18  6:44 AM  Result Value Ref Range   Glucose-Capillary 104 (H) 65 - 99 mg/dL  Glucose, capillary     Status: Abnormal   Collection Time: 05/14/18 11:54 AM  Result Value Ref Range   Glucose-Capillary 104 (H) 65 - 99 mg/dL  Glucose, capillary     Status: None   Collection Time: 05/14/18  4:39 PM  Result Value Ref Range   Glucose-Capillary 93 65 - 99 mg/dL     HEENT: normal Cardio: RRR and no murmur Resp: CTA B/L and Unlabored GI: BS positive and NT, ND Extremity:  No Edema Skin:  Bruise diffuse ecchymosis in BUE and BLE Neuro: Alert/Oriented, Anxious, Normal Sensory, Normal Motor, Abnormal FMC Ataxic/ dec FMC and Other 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 Physiatrist is providing close team supervision and 24 hour management of active medical problems listed below. Physiatrist and rehab team continue to assess barriers to discharge/monitor patient progress toward functional and medical goals. FIM: Function - Bathing Bathing activity did not occur: Refused Position: Shower Body parts bathed by patient: Right arm, Left arm, Chest, Abdomen, Front perineal area, Buttocks, Right upper leg, Left upper leg, Right lower leg, Left lower leg Body parts bathed by helper: Back Assist Level:  Supervision or verbal cues  Function- Upper Body Dressing/Undressing Upper body dressing/undressing activity did not occur: Refused What is the patient wearing?: Bra, Pull over shirt/dress Bra - Perfomed by patient: Thread/unthread right bra strap, Thread/unthread left bra strap, Hook/unhook bra (pull down sports bra) Pull over shirt/dress - Perfomed by patient: Thread/unthread right sleeve, Thread/unthread left sleeve, Put head through opening, Pull shirt over trunk Assist Level: Supervision or verbal cues Set up : To obtain clothing/put away Function - Lower Body Dressing/Undressing Lower body dressing/undressing activity did not occur: Refused What is the patient wearing?: Underwear, Pants, Faythe Dingwalled Hose, Shoes Position: Research officer, trade unionWheelchair/chair at [employer] Underwear - Performed by patient: Thread/unthread right underwear leg, Pull underwear up/down, Thread/unthread left underwear leg Underwear - Performed by helper: Thread/unthread left underwear leg Pants- Performed by patient: Thread/unthread right pants leg, Pull pants up/down, Thread/unthread left pants leg Pants- Performed by helper: Thread/unthread left pants leg Shoes - Performed by patient: Don/doff right shoe, Don/doff left shoe(elastic shoelaces) Shoes - Performed by helper: Don/doff right shoe, Fasten right TED Hose - Performed by helper: Don/doff right TED hose, Don/doff left TED hose Assist for footwear: Partial/moderate assist Assist for lower body dressing: Touching or steadying assistance (Pt > 75%)  Function - Toileting Toileting steps completed by patient: Adjust clothing prior to toileting, Adjust clothing after toileting, Performs perineal hygiene Toileting steps completed by helper: Performs perineal hygiene, Adjust clothing after toileting(per NT report) Toileting Assistive Devices: Grab bar or rail Assist level: Touching or steadying assistance (Pt.75%)  Function - ArchivistToilet Transfers Toilet transfer assistive device: Grab bar,  Walker Assist level to toilet: Moderate assist (Pt 50 - 74%/lift or lower) Assist level from toilet: Touching or steadying assistance (Pt > 75%)  Function - Chair/bed transfer Chair/bed transfer method: Stand pivot Chair/bed transfer assist level: Touching or steadying assistance (Pt > 75%) Chair/bed transfer assistive device: Armrests, Bedrails Chair/bed transfer details: Verbal cues for technique  Function - Locomotion: Wheelchair Will patient use wheelchair at discharge?: Yes Type: Manual Max wheelchair distance: 100 ft Assist Level: Supervision or verbal cues Assist Level: Supervision or verbal cues Function - Locomotion: Ambulation Assistive device: Walker-rolling Max distance: 50 ft Assist level: Touching or steadying assistance (Pt > 75%) Assist level: Touching or steadying assistance (Pt > 75%) Walk 50 feet with 2 turns activity did not occur: Safety/medical concerns Assist level: Touching or steadying assistance (Pt > 75%) Walk 150 feet activity did not occur: Safety/medical concerns Assist level: Touching or steadying assistance (Pt > 75%) Walk 10 feet on uneven surfaces activity did not occur: Safety/medical concerns  Function - Comprehension Comprehension: Auditory Comprehension assist level:  Follows basic conversation/direction with no assist  Function - Expression Expression: Verbal Expression assist level: Expresses basic needs/ideas: With extra time/assistive device  Function - Social Interaction Social Interaction assist level: Interacts appropriately with others with medication or extra time (anti-anxiety, antidepressant).  Function - Problem Solving Problem solving assist level: Solves complex 90% of the time/cues < 10% of the time, Solves complex problems: With extra time  Function - Memory Memory assist level: More than reasonable amount of time Patient normally able to recall (first 3 days only): That he or she is in a hospital, Staff names and  faces  Medical Problem List and Plan: 1.  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 CIR PT, OT, SLP, no driving post d/c discussed      2.  DVT Prophylaxis/Anticoagulation: Pharmaceutical: Heparin 5000U Q 12h- change to subq Lovenox 3. Pain Management: hydrocodone prn for pain. Discontinue dilaudid.  4. Mood: LCSW to follow for evaluation and support.  5. Neuropsych: This patient is not fully capable of making decisions on her own behalf. 6. Skin/Wound Care: Monitor wound for healing. Maintain adequate nutritional and hydration status.  7. Fluids/Electrolytes/Nutrition: Monitor I/O. Check lytes in am.I 6/13  8. HTN: Monitor BP bid and continue to titrate medications for tighter control as still labile. Will add catapres for prn use.  Apresoline 50mg  TID, lisinopril 20mg  BID Vitals:   05/14/18 0900 05/14/18 1358  BP: (!) 184/65 (!) 138/48  Pulse:  63  Resp:  18  Temp:  97.8 F (36.6 C)  SpO2:  97%  systolic elevation 6/14 add HCTZ 12.5mg  on 6/14, still fluctuating systolics monitor for now 9. Low calorie malnutrition:  Check magnesium/phosphorus levels in am. Add protein supplements.  10. Pre diabetes: Hgb A1C- 5.8  11. Hypokalemia: Supplement today with Kdur and Mag Ox. Improved 6/13 12. Dizziness:Central vestibular dysfunction overall improving symptoms worsen with head movements  LOS (Days) 7 A FACE TO FACE EVALUATION WAS PERFORMED  Erick Colace 05/14/2018, 5:20 PM

## 2018-05-14 NOTE — Progress Notes (Signed)
Occupational Therapy Session Note  Patient Details  Name: Tiffany Thornton MRN: [id number] Date of Birth: 02-20-1933  Today's Date: 05/14/2018 OT Individual Time: [id number] and 1300-1330 OT Individual Time Calculation (min): 60 min and 30 min   Short Term Goals: Week 1:  OT Short Term Goal 1 (Week 1): Pt will complete 2 grooming tasks standing at sink with supervision to increase functional activity tolerance OT Short Term Goal 2 (Week 1): Pt will maintain sustained attention during 2 consecutive functional tasks with min cuing OT Short Term Goal 3 (Week 1): Pt will complete 3/3 toileting tasks with min steadying assist  Skilled Therapeutic Interventions/Progress Updates:    Session One: Pt seen for OT ADL bathing/dressing session. Pt sitting up in bed upon arrival, eating breakfast and agreeable to tx session. She transferred to sitting EOB to finish breakfast, requiring slightly increased time 2/2 dizziness upon sitting up.  Pt ambulated throughout session with RW and CGA, VCs for RW management in functional context. She bathed seated on tub transfer bench, standing with use of grab bars for steadying assist.  She returned to standard chair to dress, demonstrating R UE ataxia when attempting to pick out piece of clothing, don deoderant and lotion. She dressed from seated position, standing with RW and CGA to pull pants up, pt with posterior bias during standing.  Following seated rest break, she completed oral care standing at sink with supervision.  Pt returned to recliner at end of session, left seated with all needs in reach.  Throughout session, pt required seated rest breaks during ADL tasks due to decreased functional activity tolerance.  Education provided throughout session regarding energy conservation, task prioritization, delegating tasks, CVA recovery, continuum of care, and d/c planning.   Session Two: Pt seen for OT session focusing on ADL re-training and neuro re-ed with fine  motor coordination. . Pt in supine upon arrival, agreeable to tx session and denying pain. She donned shoes seated EOB with supervision. She ambulated to bathroom with close supervision, standing to complete clothing management and hygiene with guarding assist. She washed hands standing at sink, demonstrating improved functional activity tolerance.  She sat EOB to complete fine motor activities utilizing small foam block cut into cubes. Addressed fine motor manipulaition and control, requiring pt to use various fine motor grasps and in-hand manipulation skills for transition and translation skills with R UE. Items left for pt to practice with outside of tx session.  Pt returned to supine at end of session to rest, left with all needs in reach and bed alarm on.   Therapy Documentation Precautions:  Precautions Precautions: Fall Restrictions Weight Bearing Restrictions: No Pain:   No/denies pain  See Function Navigator for Current Functional Status.   Therapy/Group: Individual Therapy  Siriah Treat L 05/14/2018, 7:08 AM

## 2018-05-15 ENCOUNTER — Inpatient Hospital Stay (HOSPITAL_COMMUNITY): Payer: Medicare Other | Admitting: Occupational Therapy

## 2018-05-15 ENCOUNTER — Inpatient Hospital Stay (HOSPITAL_COMMUNITY): Payer: Medicare Other | Admitting: Physical Therapy

## 2018-05-15 ENCOUNTER — Inpatient Hospital Stay (HOSPITAL_COMMUNITY): Payer: Medicare Other

## 2018-05-15 LAB — GLUCOSE, CAPILLARY
GLUCOSE-CAPILLARY: 106 mg/dL — AB (ref 65–99)
GLUCOSE-CAPILLARY: 115 mg/dL — AB (ref 65–99)
GLUCOSE-CAPILLARY: 89 mg/dL (ref 65–99)
Glucose-Capillary: 105 mg/dL — ABNORMAL HIGH (ref 65–99)

## 2018-05-15 MED ORDER — ENOXAPARIN SODIUM 40 MG/0.4ML ~~LOC~~ SOLN
40.0000 mg | SUBCUTANEOUS | Status: DC
  Administered 2018-05-15 – 2018-05-21 (×7): 40 mg via SUBCUTANEOUS
  Filled 2018-05-15 (×7): qty 0.4

## 2018-05-15 NOTE — Progress Notes (Signed)
Physical Therapy Session Note  Patient Details  Name: Tiffany Thornton MRN: [id number] Date of Birth: 11-30-1932  Today's Date: 05/15/2018 PT Individual Time: 1117-1201 and 2992-4268 PT Individual Time Calculation (min): 44 min and 26 min  Short Term Goals: Week 1:  PT Short Term Goal 1 (Week 1): Pt will perform stand pivot transfers with LRAD with min A PT Short Term Goal 1 - Progress (Week 1): Met PT Short Term Goal 2 (Week 1): Pt will ambulate 50 feet with LRAD and min A PT Short Term Goal 2 - Progress (Week 1): Met PT Short Term Goal 3 (Week 1): Pt will perform 8 steps with min A PT Short Term Goal 3 - Progress (Week 1): Progressing toward goal  Skilled Therapeutic Interventions/Progress Updates:  Treatment 1: Pt received in bed & agreeable to tx. No c/o pain reported but pt dizzy frequently during session with education provided regarding gaze stabilization. Pt transfers to EOB with supervision and hospital bed features. Pt reports need to use restroom and ambulates within room & bathroom with RW & min assist with cuing for safety. When managing clothing pt requires max cuing to maintain 1UE support on RW for balance as pt frequently losing balance otherwise even with min/mod assist. Pt with continent void and performed peri hygiene without assistance. Pt requires min assist when standing at sink with UE support while performing hand hygiene. In gym pt ambulates 200 ft with RW & steady/min assist with more ataxia RUE/RLE on this date but improved endurance. Pt then engaged in standing ball kicks without UE support with min/mod assist for balance, then seated & standing ball toss with task focusing on dynamic standing balance, coordination BUE/BLE and weight shifting L<>R. Recreational therapist present for last portion of session and reiterates to pt need for 24 hr supervision upon d/c. At end of session pt left sitting in w/c in room with belt & chair alarm donned, daughter assisting with setting  up meal tray, call bell within reach. (Pt's son present for beginning of session & educated on recommendation of 24 hr supervision upon pt's d/c with him reporting the family is working this out.) Throughout session pt requires max cuing for hand placement for sit<>stand transfers as pt with little carryover of ongoing education.  Treatment 2: Pt received in w/c & agreeable to tx, no c/o pain reported but pt noting nausea. Transported pt to dayroom via w/c total assist for time management. Pt completed stand pivot w/c<>kinetron with min assist. Pt utilized kinetron sitting progressing to standing with BUE support with task focusing on weight shifting, BLE strengthening, & NMR. Pt performed sit<>Stand transfers without BUE support and min/mod assist with focus on anterior weight shifting and improving eccentric control with stand>sit. At end of session pt left sitting in w/c in room with belt & chair alarm donned, call bell in reach.   Therapy Documentation Precautions:  Precautions Precautions: Fall Restrictions Weight Bearing Restrictions: No   See Function Navigator for Current Functional Status.   Therapy/Group: Individual Therapy  [name] 05/15/2018, 1:32 PM

## 2018-05-15 NOTE — Progress Notes (Addendum)
Speech Language Pathology Weekly Progress and Session Note  Patient Details  Name: Tiffany Thornton MRN: [id number] Date of Birth: [birth year]  Beginning of progress report period: May 08, 2018 End of progress report period: May 15, 2018  Today's Date: 05/15/2018 SLP Individual Time: 1330-1345 [phone] SLP Individual Time Calculation (min): 15 min and 75 min  Short Term Goals: Week 1: SLP Short Term Goal 1 (Week 1): Pt will demonstrate sustained attention to functional task for 10 minutes with mod A verbal cues for redirection. SLP Short Term Goal 1 - Progress (Week 1): Met SLP Short Term Goal 2 (Week 1): Pt will demonstrate 2 safety precuations during functional tasks with Mod A verbal cues.  SLP Short Term Goal 2 - Progress (Week 1): Met SLP Short Term Goal 3 (Week 1): Pt will utilize external memory aid to recall new, daily information with min A verbal cues.  SLP Short Term Goal 3 - Progress (Week 1): Met SLP Short Term Goal 4 (Week 1): Pt will utilize speech intelligibility strategies at the sentence level with min A verbal cues to achieve 80% intelligibility.  SLP Short Term Goal 4 - Progress (Week 1): Met    New Short Term Goals: Week 2: SLP Short Term Goal 1 (Week 2): Pt will demonstrate selective attention in mildly distractioning enviorment to functional task for 45 minutes with supervision A verbal cues for redirection. SLP Short Term Goal 2 (Week 2): Pt will demonstrate 2 safety precuations during functional tasks with supervision A verbal cues.  SLP Short Term Goal 3 (Week 2): Pt will utilize external memory aid to recall new, daily information with Supervision A verbal cues.  SLP Short Term Goal 4 (Week 2): Pt will utilize speech intelligibility strategies at the sentence level with supervision A verbal cues to achieve 80% intelligibility.  SLP Short Term Goal 5 (Week 2): Pt will complete semi-complex functional problem solving tasks with supervision A question and verbal  cues. SLP Short Term Goal 6 (Week 2): Pt will demonstrate self-monitoring and self-correction in functional problem solving tasks with supervision A verbal and question cues.  Weekly Progress Updates: Pt made great porgress meeting 4 out 4 goals, currently requiring Min-Supervision A. Pt demonstrated increase in cognitive skills pertaining to sustained attention, use of safety precautions, recall, speech intelligibility and problem solving, however acceptance of requiring 24 hours supervision upon discharge and veritgo impact pt's progress compared to cognitive ability. Pt would continue to benefit from skilled ST services in order to maximize functional independence and reduce burden of care prior to discharge requiring 24 hours supervision.      Intensity: Minumum of 1-2 x/day, 30 to 90 minutes Frequency: 3 to 5 out of 7 days Duration/Length of Stay: 12-18  Treatment/Interventions: Cognitive remediation/compensation;Cueing hierarchy;Functional tasks;Patient/family education;Environmental controls   Daily Session  Skilled Therapeutic Interventions: #1 Skilled ST services focused on cognitive skills. SLP facilitated semi-complex problem solving and recall utilizing personal medication management task requiring min A fading to supervision A question cues and check balancing tasks requiring min A fading to supervision A question/verbal cues. Pt demonstrated anxious behaviors presevrating on check balancing task piror to completion, not being the way she did it at home and required max a verbal cues for encouragement. Pt demonstrated selective attention for 45 minutes with min A verbal cues. Pt stated she felt "dizzy" and requested to get in bed. Pt demonstrated use of safety precautions transferring from [facility] to bed with min-supervision A question cues. Pt vomited in bag while  in bed and complained of headache, SLP notified nursing staff Pt was left in room with call bell within reach. Reccomend to  continue skilled ST services.       #2 Skilled ST services focused on cogntive skills. SLP facilitated recall of medication utilizing medication list at -.   Pt was left in room with call bell within reach. Reccomend to continue skilled ST services.  Function:   Eating Eating                 Cognition Comprehension Comprehension assist level: Follows basic conversation/direction with no assist  Expression   Expression assist level: Expresses basic needs/ideas: With extra time/assistive device  Social Interaction Social Interaction assist level: Interacts appropriately with others with medication or extra time (anti-anxiety, antidepressant).  Problem Solving Problem solving assist level: Solves complex 90% of the time/cues < 10% of the time;Solves complex problems: With extra time  Memory Memory assist level: More than reasonable amount of time   General    Pain Pain Assessment Faces Pain Scale: Hurts little more Pain Type: Acute pain Pain Location: Head Pain Orientation: Posterior  Therapy/Group: Individual Therapy  Kimiyah Blick  [facility] 05/15/2018, 1:59 PM

## 2018-05-15 NOTE — Progress Notes (Signed)
Subjective/Complaints:  Appreciate neuropsych note, pt working on [employer] management with SLP, complains that it is different than the way she does it  ROS- neg CP, SOB, +nausea, no vomiting no abd pain  Objective: Vital Signs: Blood pressure (!) 138/52, pulse 63, temperature 98.2 F (36.8 C), temperature source Oral, resp. rate 15, height 5\' 7"  (1.702 m), weight 62 kg (136 lb 11 oz), SpO2 96 %. No results found. Results for orders placed or performed during the hospital encounter of 05/07/18 (from the past 72 hour(s))  Glucose, capillary     Status: Abnormal   Collection Time: 05/12/18 11:50 AM  Result Value Ref Range   Glucose-Capillary 106 (H) 65 - 99 mg/dL  Glucose, capillary     Status: Abnormal   Collection Time: 05/12/18  4:27 PM  Result Value Ref Range   Glucose-Capillary 153 (H) 65 - 99 mg/dL  Glucose, capillary     Status: Abnormal   Collection Time: 05/12/18  9:45 PM  Result Value Ref Range   Glucose-Capillary 119 (H) 65 - 99 mg/dL  Glucose, capillary     Status: None   Collection Time: 05/13/18  6:11 AM  Result Value Ref Range   Glucose-Capillary 93 65 - 99 mg/dL  Glucose, capillary     Status: None   Collection Time: 05/13/18 11:55 AM  Result Value Ref Range   Glucose-Capillary 95 65 - 99 mg/dL  Glucose, capillary     Status: Abnormal   Collection Time: 05/13/18  4:44 PM  Result Value Ref Range   Glucose-Capillary 174 (H) 65 - 99 mg/dL  Glucose, capillary     Status: None   Collection Time: 05/13/18  9:46 PM  Result Value Ref Range   Glucose-Capillary 96 65 - 99 mg/dL  CBC     Status: Abnormal   Collection Time: 05/14/18  5:32 AM  Result Value Ref Range   WBC 8.8 4.0 - 10.5 K/uL   RBC 3.73 (L) 3.87 - 5.11 MIL/uL   Hemoglobin 11.8 (L) 12.0 - 15.0 g/dL   HCT 21.335.5 (L) 08.636.0 - 57.846.0 %   MCV 95.2 78.0 - 100.0 fL   MCH 31.6 26.0 - 34.0 pg   MCHC 33.2 30.0 - 36.0 g/dL   RDW 46.913.8 62.911.5 - 52.815.5 %   Platelets 380 150 - 400 K/uL    Comment: Performed at [facility], [address] [place], [place] [id number]  Glucose, capillary     Status: Abnormal   Collection Time: 05/14/18  6:44 AM  Result Value Ref Range   Glucose-Capillary 104 (H) 65 - 99 mg/dL  Glucose, capillary     Status: Abnormal   Collection Time: 05/14/18 11:54 AM  Result Value Ref Range   Glucose-Capillary 104 (H) 65 - 99 mg/dL  Glucose, capillary     Status: None   Collection Time: 05/14/18  4:39 PM  Result Value Ref Range   Glucose-Capillary 93 65 - 99 mg/dL  Glucose, capillary     Status: None   Collection Time: 05/14/18  9:01 PM  Result Value Ref Range   Glucose-Capillary 94 65 - 99 mg/dL  Glucose, capillary     Status: Abnormal   Collection Time: 05/15/18  6:25 AM  Result Value Ref Range   Glucose-Capillary 106 (H) 65 - 99 mg/dL     HEENT: normal Cardio: RRR and no murmur Resp: CTA B/L and Unlabored GI: BS positive and NT, ND Extremity:  No Edema Skin:   Bruise diffuse ecchymosis  in BUE and BLE Neuro: Alert/Oriented, Anxious, Normal Sensory, Normal Motor, Abnormal FMC Ataxic/ dec FMC and Other 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 Physiatrist is providing close team supervision and 24 hour management of active medical problems listed below. Physiatrist and rehab team continue to assess barriers to discharge/monitor patient progress toward functional and medical goals. FIM: Function - Bathing Bathing activity did not occur: Refused Position: Shower Body parts bathed by patient: Right arm, Left arm, Chest, Abdomen, Front perineal area, Buttocks, Right upper leg, Left upper leg, Right lower leg, Left lower leg Body parts bathed by helper: Back Assist Level: Supervision or verbal  cues  Function- Upper Body Dressing/Undressing Upper body dressing/undressing activity did not occur: Refused What is the patient wearing?: Bra, Pull over shirt/dress Bra - Perfomed by patient: Thread/unthread right bra strap, Thread/unthread left bra strap, Hook/unhook bra (pull down sports bra) Pull over shirt/dress - Perfomed by patient: Thread/unthread right sleeve, Thread/unthread left sleeve, Put head through opening, Pull shirt over trunk Assist Level: Supervision or verbal cues Set up : To obtain clothing/put away Function - Lower Body Dressing/Undressing Lower body dressing/undressing activity did not occur: Refused What is the patient wearing?: Underwear, Pants, Faythe [place], Shoes Position: Research officer, trade union at [employer] - Performed by patient: Thread/unthread right underwear leg, Pull underwear up/down, Thread/unthread left underwear leg Underwear - Performed by helper: Thread/unthread left underwear leg Pants- Performed by patient: Thread/unthread right pants leg, Pull pants up/down, Thread/unthread left pants leg Pants- Performed by helper: Thread/unthread left pants leg Shoes - Performed by patient: Don/doff right shoe, Don/doff left shoe(elastic shoelaces) Shoes - Performed by helper: Don/doff right shoe, Fasten right TED Hose - Performed by helper: Don/doff right TED hose, Don/doff left TED hose Assist for footwear: Partial/moderate assist Assist for lower body dressing: Touching or steadying assistance (Pt > 75%)  Function - Toileting Toileting steps completed by patient: Adjust clothing prior to toileting, Adjust clothing after toileting, Performs perineal hygiene Toileting steps completed by helper: Performs perineal hygiene, Adjust clothing after toileting(per NT report) Toileting Assistive Devices: Grab bar or rail Assist level: Touching or steadying assistance (Pt.75%)  Function - Archivist transfer assistive device: Grab bar, Walker Assist level to  toilet: Moderate assist (Pt 50 - 74%/lift or lower) Assist level from toilet: Touching or steadying assistance (Pt > 75%)  Function - Chair/bed transfer Chair/bed transfer method: Stand pivot Chair/bed transfer assist level: Touching or steadying assistance (Pt > 75%) Chair/bed transfer assistive device: Armrests, Bedrails Chair/bed transfer details: Verbal cues for technique  Function - Locomotion: Wheelchair Will patient use wheelchair at discharge?: Yes Type: Manual Max wheelchair distance: 100 ft Assist Level: Supervision or verbal cues Assist Level: Supervision or verbal cues Function - Locomotion: Ambulation Assistive device: Walker-rolling Max distance: 50 ft Assist level: Touching or steadying assistance (Pt > 75%) Assist level: Touching or steadying assistance (Pt > 75%) Walk 50 feet with 2 turns activity did not occur: Safety/medical concerns Assist level: Touching or steadying assistance (Pt > 75%) Walk 150 feet activity did not occur: Safety/medical concerns Assist level: Touching or steadying assistance (Pt > 75%) Walk 10 feet on uneven surfaces activity did not occur: Safety/medical concerns  Function - Comprehension Comprehension: Auditory Comprehension assist level: Follows basic conversation/direction  with no assist  Function - Expression Expression: Verbal Expression assist level: Expresses basic needs/ideas: With extra time/assistive device  Function - Social Interaction Social Interaction assist level: Interacts appropriately with others with medication or extra time (anti-anxiety, antidepressant).  Function - Problem Solving Problem solving assist level: Solves complex 90% of the time/cues < 10% of the time, Solves complex problems: With extra time  Function - Memory Memory assist level: More than reasonable amount of time Patient normally able to recall (first 3 days only): That he or she is in a hospital, Staff names and faces  Medical Problem List  and Plan: 1.  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 CIR PT, OT, SLP, Team conf in am   2.  DVT Prophylaxis/Anticoagulation: Pharmaceutical: Heparin 5000U Q 12h- change to subq Lovenox 3. Pain Management: hydrocodone prn for pain. Discontinue dilaudid.  4. Mood: LCSW to follow for evaluation and support.  5. Neuropsych: This patient is not fully capable of making decisions on her own behalf. 6. Skin/Wound Care: Monitor wound for healing. Maintain adequate nutritional and hydration status.  7. Fluids/Electrolytes/Nutrition: Monitor I/O. Check lytes in am.I 6/13  8. HTN: Monitor BP bid and continue to titrate medications for tighter control as still labile. Will add catapres for prn use.  Apresoline 50mg  TID, lisinopril 20mg  BID Vitals:   05/15/18 0428 05/15/18 0621  BP: (!) 134/55 (!) 138/52  Pulse: 63   Resp: 15   Temp: 98.2 F (36.8 C)   SpO2: 96%   systolic elevation 6/14 add HCTZ 12.5mg  on 6/14,in range 6/18 9. Low calorie malnutrition:  Check magnesium/phosphorus levels in am. Add protein supplements.  10. Pre diabetes: Hgb A1C- 5.8  11. Hypokalemia: Supplement today with Kdur and Mag Ox. Improved 6/13 12. Dizziness:Central vestibular dysfunction overall improving symptoms worsen with head movements  LOS (Days) 8 A FACE TO FACE EVALUATION WAS PERFORMED  Erick Colace 05/15/2018, 8:14 AM

## 2018-05-15 NOTE — Progress Notes (Signed)
Occupational Therapy Weekly Progress Note  Patient Details  Name: Tiffany Thornton MRN: [id number] Date of Birth: 1933/02/09  Beginning of progress report period: May 08, 2018 End of progress report period: May 15, 2018  Today's Date: 05/15/2018 OT Individual Time: 7026-3785 OT Individual Time Calculation (min): 75 min    Patient has met 3 of 3 short term goals. Pt is making slow but steady progress towards OT goals. She cont to be most limited by poor functional activity tolerance requiring significantly increased time and rest breaks to complete ADL tasks. She demonstrates R UE ataxia impacting abilities to carry out ADLs as she did PTA. Pt also continues to have vestibular deficits, particularly during change of position from supine> sitting, however, pt appears to manage symptoms well. While pt remains very motivated in therapies, she is also frustrated by her loss of complete independence and the burden she perceives she will be on her family. Continue providing empathetic listening and encouragement during tx sessions. Pt is on track to meet supervision level goals.   Patient continues to demonstrate the following deficits: ataxia, cognitive deficits and hemiplegia affecting dominant side and therefore will continue to benefit from skilled OT intervention to enhance overall performance with BADL, iADL and Reduce care partner burden.  Patient progressing toward long term goals..  Continue plan of care.  OT Short Term Goals Week 1:  OT Short Term Goal 1 (Week 1): Pt will complete 2 grooming tasks standing at sink with supervision to increase functional activity tolerance OT Short Term Goal 1 - Progress (Week 1): Met OT Short Term Goal 2 (Week 1): Pt will maintain sustained attention during 2 consecutive functional tasks with min cuing OT Short Term Goal 2 - Progress (Week 1): Met OT Short Term Goal 3 (Week 1): Pt will complete 3/3 toileting tasks with min steadying assist OT Short Term  Goal 3 - Progress (Week 1): Met Week 2:  OT Short Term Goal 1 (Week 2): STG=LTG due to LOS  Skilled Therapeutic Interventions/Progress Updates:    Pt seen for OT ADL bathing/dressing session. Pt sitting upright in bed upon arrival, voicing fatigue following extended time sitting EOB to eat breakfast. Discussed energy conservation of sitting in supportive chair to eat meals as not to fatigue. She opted to complete bathing/dressing from w/c level at sink today. Functional transfers completed throughout session with close supervision and VCs for hand placement during sit<> stand. She stood at sink with supervision complete pericare/buttock hygiene and LB clothing management. She stood with supervision to complete oral care, tolerating ~2 minutes in standing before seated rest break.  She was taken to therapy gym total A in w/c for time and energy conservation. Completed fine motor activities initially at table top level, placing clothes pin along bar, focusing on coordinated and smooth pursuits of movement as well as UE strengthening/endurance in unsupported position. Pt demonstrates full functional grasping abilities to manipulate all tensions of clothes pins. She then completed task from standing position, reaching across midline and overhead to place clothes pins on basketball hoop netting. Placed #1 wrist weight on R arm for increased proprioceptive input for smoothness of coordination with good success. Completed x3 rounds in standing, first trial with RW, second trial with HHA, and third trial attempted with HHA standing on foam wedge, however, unable to maintain balance on wedge and therefore transitiond back to standing on floor with HHA..  Following seated rest break, ambulated to ADL apartment. Completed simulated kitchen activity with pt required to  emove and replace items using R UE. Pt able to lift and manipulate kitchen items of various sizes/shapes, requiring use of B UEs when lifting large peanut  butter jar. Pt returned to room in w/c at end of session, with encouragement willing to stay up in w/c for 15 minutes until SLP session. Pt left with all needs in reach.   Therapy Documentation Precautions:  Precautions Precautions: Fall Restrictions Weight Bearing Restrictions: No Pain:   No/denies pain See Function Navigator for Current Functional Status.   Therapy/Group: Individual Therapy  Rounds, Amy L 05/15/2018, 6:54 AM

## 2018-05-15 NOTE — Progress Notes (Signed)
Physical Therapy Weekly Progress Note  Patient Details  Name: Tiffany Thornton MRN: [id number] Date of Birth: 03/30/1933  Beginning of progress report period: May 08, 2018 End of progress report period: May 15, 2018  Today's Date: 05/15/2018   Patient has met 2 of 3 short term goals.  Pt is progressing towards all supervision level goals, as she currently requires min/steady assist progressing to close supervision for gait with RW. Pt continues to demonstrate R side ataxia (UE>LE) and impaired awareness of deficits and need for 24 hr supervision upon d/c although family is aware of this recommendation. Pt still presents with impaired central origin vestibular issues that cause dizziness with movement, but pt has reported she has been performing gaze stabilization exercises to help reduce symptoms.  Pt would benefit from continued skilled PT treatment to focus on coordination, NMR, transfers, balance, gait, stair negotiation, and education to ensure safety upon d/c.   Patient continues to demonstrate the following deficits muscle weakness, decreased cardiorespiratory endurance, ataxia and decreased coordination, decreased awareness, decreased problem solving, decreased safety awareness and decreased memory, central origin and decreased standing balance, decreased postural control, hemiplegia and decreased balance strategies and therefore will continue to benefit from skilled PT intervention to increase functional independence with mobility.  Patient progressing toward long term goals..  Continue plan of care.  PT Short Term Goals Week 1:  PT Short Term Goal 1 (Week 1): Pt will perform stand pivot transfers with LRAD with min A PT Short Term Goal 1 - Progress (Week 1): Met PT Short Term Goal 2 (Week 1): Pt will ambulate 50 feet with LRAD and min A PT Short Term Goal 2 - Progress (Week 1): Met PT Short Term Goal 3 (Week 1): Pt will perform 8 steps with min A PT Short Term Goal 3 - Progress (Week 1):  Progressing toward goal Week 2:  PT Short Term Goal 2 (Week 2): STG = LTG due to estimated ELOS.    Therapy Documentation Precautions:  Precautions Precautions: Fall Restrictions Weight Bearing Restrictions: No    See Function Navigator for Current Functional Status.  Therapy/Group: Individual Therapy  Victoria M Miller 05/15/2018, 8:34 AM  

## 2018-05-15 NOTE — Evaluation (Signed)
Recreational Therapy Assessment and Plan  Patient Details  Name: Tiffany Thornton MRN: [id number] Date of Birth: Jun 01, 1933 Today's Date: 05/15/2018  Rehab Potential: good ELOS: 2 weeks Assessment    Problem List:      Patient Active Problem List   Diagnosis Date Noted  . Cerebellar hemorrhage, nontraumatic (HCC) 05/07/2018  . Ataxia, post-stroke   . Essential hypertension   . Prediabetes   . Hypokalemia   . Dizziness and giddiness   . Cognitive deficit, post-stroke   . Hypertensive emergency   . Hyperlipidemia   . Cerebellar edema ([place])   . Leukocytosis   . S/P craniotomy 04/30/2018  . 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 Hyperlipidemia   . Hypothyroid    Past Surgical History:       Past Surgical History:  Procedure Laterality Date  . ABDOMINAL HYSTERECTOMY    . APPENDECTOMY    . CHOLECYSTECTOMY    . CRANIOTOMY N/A 04/30/2018   Procedure: CRANIOTOMY HEMATOMA EVACUATION SUBDURAL;  Surgeon: Earnie Larsson, MD;  Location: [facility];  Service: Neurosurgery;  Laterality: N/A;  suboccipital craniectomy for hematoma    Assessment & Plan Clinical Impression: Tiffany Thornton is an 84 year old female with history of hypothyroid otherwise in good health; who was admitted on 04/30/18 with acute mental status changes with nausea, vomiting and vertigo. History taken from chart review. She was noted to have hypertensive emergency with systolic blood pressure greater than 200 and CT of head done, reviewed, showing right cerebellar hemorrhage. Per report, large acute right cerebellar hemorrhage with mass-effect on 4th ventricle and waxing an waning of symptoms. She was evaluated by Dr. Ronnald Ramp and taken to OR for suboccipital craniectomy and evacuation of hemorrhage. Post op with mild dysarthria and right ataxia. 2 D echo showed EF 60-65% with grade 2 diastolic  dysfunction and calcified mitral annulus with trivial regurgitation. Dr. Erlinda Hong felt that bleed due to hypertensive emergency and SBP goals <160. MRI/MRA brain done revealing stable bleed with acute/subacute nonhemorrhagic infarct lateral to cerebellar hemorrhage, decreasing mass effect and no hydrocephalus and multiple intracranial aneurysms. Scopolamine patch added to help with vertigo and mobility improving. She continues to have dizziness with activity as well as poor safety awareness. Patient transferred to CIR on 05/07/2018 .  Met with pt again today during co-treat with PT.  Pt presents with decreased activity tolerance, decreased functional mobility, decreased balance, ataxia, decreased attention, decreased awareness, decreased problem solving, decreased safety awareness and decreased memory Limiting pt's independence with leisure/community pursuits.  Plan Min 1 TR session >20 minutes during LOS  Recommendations for other services: Neuropsych  Discharge Criteria: Patient will be discharged from TR if patient refuses treatment 3 consecutive times without medical reason.  If treatment goals not met, if there is a change in medical status, if patient makes no progress towards goals or if patient is discharged from hospital.  The above assessment, treatment plan, treatment alternatives and goals were discussed and mutually agreed upon: by patient  [place] 05/15/2018, 4:24 PM

## 2018-05-16 ENCOUNTER — Inpatient Hospital Stay (HOSPITAL_COMMUNITY): Payer: Medicare Other | Admitting: Speech Pathology

## 2018-05-16 ENCOUNTER — Inpatient Hospital Stay (HOSPITAL_COMMUNITY): Payer: Medicare Other | Admitting: Physical Therapy

## 2018-05-16 ENCOUNTER — Inpatient Hospital Stay (HOSPITAL_COMMUNITY): Payer: Medicare Other | Admitting: Occupational Therapy

## 2018-05-16 ENCOUNTER — Inpatient Hospital Stay (HOSPITAL_COMMUNITY): Payer: Medicare Other

## 2018-05-16 LAB — GLUCOSE, CAPILLARY
GLUCOSE-CAPILLARY: 92 mg/dL (ref 65–99)
GLUCOSE-CAPILLARY: 112 mg/dL — AB (ref 65–99)
GLUCOSE-CAPILLARY: 111 mg/dL — AB (ref 65–99)
GLUCOSE-CAPILLARY: 128 mg/dL — AB (ref 65–99)

## 2018-05-16 NOTE — Progress Notes (Signed)
Speech Language Pathology Daily Session Note  Patient Details  Name: Tiffany Thornton MRN: [id number] Date of Birth: [birth year]  Today's Date: 05/16/2018 SLP Individual Time: 1130-1230 SLP Individual Time Calculation (min): 60 min  Short Term Goals: Week 2: SLP Short Term Goal 1 (Week 2): Pt will demonstrate selective attention in mildly distractioning enviorment to functional task for 45 minutes with supervision A verbal cues for redirection. SLP Short Term Goal 2 (Week 2): Pt will demonstrate 2 safety precuations during functional tasks with supervision A verbal cues.  SLP Short Term Goal 3 (Week 2): Pt will utilize external memory aid to recall new, daily information with Supervision A verbal cues.  SLP Short Term Goal 4 (Week 2): Pt will utilize speech intelligibility strategies at the sentence level with supervision A verbal cues to achieve 80% intelligibility.  SLP Short Term Goal 5 (Week 2): Pt will complete semi-complex functional problem solving tasks with supervision A question and verbal cues. SLP Short Term Goal 6 (Week 2): Pt will demonstrate self-monitoring and self-correction in functional problem solving tasks with supervision A verbal and question cues.  Skilled Therapeutic Interventions: Skilled treatment session focused on cognitive goals. SLP facilitated session by providing Min A verbal cues for problem solving during a calender organization task. Patient also demonstrated selective attention to task in a mildly distracting environment for ~30 minutes with supervision verbal cues. Patient transferred back to bed at end of session. Patient left upright in bed with alarm on and family present. Continue with current plan of care.      Function:   Cognition Comprehension Comprehension assist level: Follows basic conversation/direction with no assist  Expression   Expression assist level: Expresses basic needs/ideas: With extra time/assistive device  Social Interaction Social  Interaction assist level: Interacts appropriately with others with medication or extra time (anti-anxiety, antidepressant).  Problem Solving Problem solving assist level: Solves basic 75 - 89% of the time/requires cueing 10 - 24% of the time  Memory Memory assist level: Recognizes or recalls 75 - 89% of the time/requires cueing 10 - 24% of the time    Pain Pain Assessment Pain Score: 0-No pain  Therapy/Group: Individual Therapy  Tiffany Thornton 05/16/2018, 3:31 PM

## 2018-05-16 NOTE — Progress Notes (Signed)
Speech Language Pathology Daily Session Note  Patient Details  Name: Tiffany Thornton MRN: [id number] Date of Birth: 1933-08-16  Today's Date: 05/16/2018 SLP Individual Time: 1000-1030 SLP Individual Time Calculation (min): 30 min  Short Term Goals: Week 2: SLP Short Term Goal 1 (Week 2): Pt will demonstrate selective attention in mildly distractioning enviorment to functional task for 45 minutes with supervision A verbal cues for redirection. SLP Short Term Goal 2 (Week 2): Pt will demonstrate 2 safety precuations during functional tasks with supervision A verbal cues.  SLP Short Term Goal 3 (Week 2): Pt will utilize external memory aid to recall new, daily information with Supervision A verbal cues.  SLP Short Term Goal 4 (Week 2): Pt will utilize speech intelligibility strategies at the sentence level with supervision A verbal cues to achieve 80% intelligibility.  SLP Short Term Goal 5 (Week 2): Pt will complete semi-complex functional problem solving tasks with supervision A question and verbal cues. SLP Short Term Goal 6 (Week 2): Pt will demonstrate self-monitoring and self-correction in functional problem solving tasks with supervision A verbal and question cues.  Skilled Therapeutic Interventions:Skilled ST services focused on cognitive skills. SLP facilitated mildly complex problem solving and error awareness skills utilizing 4-5 step functional sequence cards, pt required supervision A verbal cues for error awareness/correction. Pt demonstrated 90% intelligibility at sentence level given supervision A verbal cues. SLP facilitated use of safety precautions [facility] requiring supervision A question cues. Pt was left in room with call bell within reach and bed alaram set.ST reccomends to continuedd skilled ST services.     Function:  Eating Eating                 Cognition Comprehension Comprehension assist level: Follows basic conversation/direction with no  assist  Expression   Expression assist level: Expresses basic needs/ideas: With extra time/assistive device  Social Interaction Social Interaction assist level: Interacts appropriately with others with medication or extra time (anti-anxiety, antidepressant).  Problem Solving Problem solving assist level: Solves complex 90% of the time/cues < 10% of the time  Memory Memory assist level: More than reasonable amount of time    Pain Pain Assessment Pain Score: 0-No pain  Therapy/Group: Individual Therapy  Jode Lippe  [facility] 05/16/2018, 2:11 PM

## 2018-05-16 NOTE — Progress Notes (Signed)
Occupational Therapy Session Note  Patient Details  Name: Tiffany Thornton MRN: [id number] Date of Birth: 04/01/1933  Today's Date: 05/16/2018 OT Individual Time: [id number] OT Individual Time Calculation (min): 75 min    Short Term Goals: Week 2:  OT Short Term Goal 1 (Week 2): STG=LTG due to LOS  Skilled Therapeutic Interventions/Progress Updates:    Pt seen for OT ADL bathing/dressing session. Pt awake sitting upright in bed, agreeable to tx session and denying pain.  She ambulated throughout session with RW and guarding assist, cuing for RW management in functional context and for reaching back during stand> sit. She bathed seated on tub bench, using R UE at non-dominant level throughout. She stood with use of grab bars to complete pericare/buttock hygiene. She returned to w/c to dress, steadying assist required during LB clothing management due to posterior bias with cuing to self correct. Rest breaks required throughout dressing task.  She then completed grooming tasks standing at sink, cont to demonstrate R UE ataxia during functional tasks when reaching to grasp items.  She was taken to therapy day room and completed simple meal prep activity of making coffee. Pt able to stand at counter with close supervision. Discussed problem solving how to transport items while also managing RW.  Pt returned to room at end of session, left seated in w/c with all needs in reach.  Extensive education provided throughout session regarding energy conservation, rate of return, need for supervision at d/c, activity modification, and d/c planning.   Therapy Documentation Precautions:  Precautions Precautions: Fall Restrictions Weight Bearing Restrictions: No Pain:   No/denies pain  See Function Navigator for Current Functional Status.   Therapy/Group: Individual Therapy  Shizuo Biskup L 05/16/2018, 7:06 AM

## 2018-05-16 NOTE — Plan of Care (Signed)
  Problem: Consults Goal: RH GENERAL PATIENT EDUCATION Description See Patient Education module for education specifics. Outcome: Progressing Goal: Skin Care Protocol Initiated - if Braden Score 18 or less Description If consults are not indicated, leave blank or document N/A Outcome: Progressing   Problem: RH BOWEL ELIMINATION Goal: RH STG MANAGE BOWEL WITH ASSISTANCE Description STG Manage Bowel with min Assistance.  Outcome: Progressing Goal: RH STG MANAGE BOWEL W/MEDICATION W/ASSISTANCE Description STG Manage Bowel with Medication with min Assistance.  Outcome: Progressing   Problem: RH BLADDER ELIMINATION Goal: RH STG MANAGE BLADDER WITH ASSISTANCE Description STG Manage Bladder With min Assistance  Outcome: Progressing   Problem: RH SKIN INTEGRITY Goal: RH STG SKIN FREE OF INFECTION/BREAKDOWN Outcome: Progressing Goal: RH STG MAINTAIN SKIN INTEGRITY WITH ASSISTANCE Description STG Maintain Skin Integrity With mod Assistance.  Outcome: Progressing Goal: RH STG ABLE TO PERFORM INCISION/WOUND CARE W/ASSISTANCE Description STG Able To Perform Incision/Wound Care With mod Assistance.  Outcome: Progressing   Problem: RH SAFETY Goal: RH STG ADHERE TO SAFETY PRECAUTIONS W/ASSISTANCE/DEVICE Description STG Adhere to Safety Precautions With min-mod Assistance/Device.  Outcome: Progressing   Problem: RH PAIN MANAGEMENT Goal: RH STG PAIN MANAGED AT OR BELOW PT'S PAIN GOAL Description Less than or = 3 on 0-10 scale  Outcome: Progressing   Problem: RH KNOWLEDGE DEFICIT GENERAL Goal: RH STG INCREASE KNOWLEDGE OF SELF CARE AFTER HOSPITALIZATION Description Patient will be able to verbalize how to manage or treat vertigo at home   Outcome: Progressing

## 2018-05-16 NOTE — Progress Notes (Signed)
Physical Therapy Session Note  Patient Details  Name: Tiffany Thornton MRN: [id number] Date of Birth: [birth year]  Today's Date: 05/16/2018 PT Individual Time: 1410-1449 PT Individual Time Calculation (min): 39 min   Short Term Goals: Week 2:  PT Short Term Goal 2 (Week 2): STG = LTG due to estimated ELOS.  Skilled Therapeutic Interventions/Progress Updates:  Pt received in bed & agreeable to tx. No c/o pain reported but pt noting need to use restroom. Pt transfers out of bed with supervision and bed rails and dons shoes with supervision and cuing for technique as pt with difficulty 2/2 RUE ataxia. Pt ambulated throughout room and bathroom with RW & steady assist. Pt with continent void & performed peri hygiene and hand hygiene without assistance. Pt with improved standing balance while performing clothing management (required steady assist) compared to yesterday. Pt ambulates room<>gym with RW & close supervision with less ataxia noted compared to yesterday. Pt utilized nu-step on level 2 x 10 minutes with all four extremities with task focusing on endurance training and coordination of reciprocal movements. At end of session pt left in bed with alarm set & needs within reach.  Pt demonstrates impaired awareness, requiring ongoing education regarding cause of dizziness and need to perform gaze stabilization exercises.   Therapy Documentation Precautions:  Precautions Precautions: Fall Restrictions Weight Bearing Restrictions: No   See Function Navigator for Current Functional Status.   Therapy/Group: Individual Therapy  Sandi MariscalVictoria M Kathalina Ostermann 05/16/2018, 2:50 PM

## 2018-05-16 NOTE — Progress Notes (Signed)
Subjective/Complaints:  Per OT, more ataxic RUE Ambulates wit hwalker , with OT, doing better functionally but some increased tremor noted today in RUE  ROS- neg CP, SOB, +nausea, no vomiting no abd pain  Objective: Vital Signs: Blood pressure (!) 150/51, pulse 68, temperature 98 F (36.7 C), temperature source Oral, resp. rate 18, height 5' 7"  (1.702 m), weight 62.3 kg (137 lb 5.6 oz), SpO2 94 %. No results found. Results for orders placed or performed during the hospital encounter of 05/07/18 (from the past 72 hour(s))  Glucose, capillary     Status: None   Collection Time: 05/13/18 11:55 AM  Result Value Ref Range   Glucose-Capillary 95 65 - 99 mg/dL  Glucose, capillary     Status: Abnormal   Collection Time: 05/13/18  4:44 PM  Result Value Ref Range   Glucose-Capillary 174 (H) 65 - 99 mg/dL  Glucose, capillary     Status: None   Collection Time: 05/13/18  9:46 PM  Result Value Ref Range   Glucose-Capillary 96 65 - 99 mg/dL  CBC     Status: Abnormal   Collection Time: 05/14/18  5:32 AM  Result Value Ref Range   WBC 8.8 4.0 - 10.5 K/uL   RBC 3.73 (L) 3.87 - 5.11 MIL/uL   Hemoglobin 11.8 (L) 12.0 - 15.0 g/dL   HCT 35.5 (L) 36.0 - 46.0 %   MCV 95.2 78.0 - 100.0 fL   MCH 31.6 26.0 - 34.0 pg   MCHC 33.2 30.0 - 36.0 g/dL   RDW 13.8 11.5 - 15.5 %   Platelets 380 150 - 400 K/uL    Comment: Performed at [facility], [place] [address] [place], [place] [address]  Glucose, capillary     Status: Abnormal   Collection Time: 05/14/18  6:44 AM  Result Value Ref Range   Glucose-Capillary 104 (H) 65 - 99 mg/dL  Glucose, capillary     Status: Abnormal   Collection Time: 05/14/18 11:54 AM  Result Value Ref Range   Glucose-Capillary 104 (H) 65 - 99 mg/dL  Glucose, capillary     Status: None   Collection Time: 05/14/18  4:39 PM  Result Value Ref Range   Glucose-Capillary 93 65 - 99 mg/dL  Glucose, capillary     Status: None   Collection Time: 05/14/18  9:01 PM  Result Value  Ref Range   Glucose-Capillary 94 65 - 99 mg/dL  Glucose, capillary     Status: Abnormal   Collection Time: 05/15/18  6:25 AM  Result Value Ref Range   Glucose-Capillary 106 (H) 65 - 99 mg/dL  Glucose, capillary     Status: Abnormal   Collection Time: 05/15/18 12:00 PM  Result Value Ref Range   Glucose-Capillary 105 (H) 65 - 99 mg/dL  Glucose, capillary     Status: Abnormal   Collection Time: 05/15/18  4:39 PM  Result Value Ref Range   Glucose-Capillary 115 (H) 65 - 99 mg/dL  Glucose, capillary     Status: None   Collection Time: 05/15/18  8:48 PM  Result Value Ref Range   Glucose-Capillary 89 65 - 99 mg/dL  Glucose, capillary     Status: None   Collection Time: 05/16/18  6:40 AM  Result Value Ref Range   Glucose-Capillary 92 65 - 99 mg/dL     HEENT: normal Cardio: RRR and no murmur Resp: CTA B/L and Unlabored GI: BS positive and NT, ND Extremity:  No Edema Skin:   Bruise diffuse ecchymosis  in BUE and BLE Neuro: Alert/Oriented, Anxious, Normal Sensory, Normal Motor, Abnormal FMC Ataxic/ dec FMC and  Moderate limb ataxia with FNF and HS on RIght side, no dysmetria on Left side Musc/Skel:  Other no pain with UE or LE ROM Gen NAD   Assessment/Plan: 1. 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 Physiatrist is providing close team supervision and 24 hour management of active medical problems listed below. Physiatrist and rehab team continue to assess barriers to discharge/monitor patient progress toward functional and medical goals. FIM: Function - Bathing Bathing activity did not occur: Refused Position: Shower Body parts bathed by patient: Right arm, Left arm, Chest, Abdomen, Front perineal area, Buttocks, Right upper leg, Left upper leg, Right lower leg, Left lower leg Body parts bathed by helper: Back Assist Level:  Supervision or verbal cues  Function- Upper Body Dressing/Undressing Upper body dressing/undressing activity did not occur: Refused What is the patient wearing?: Bra, Pull over shirt/dress Bra - Perfomed by patient: Thread/unthread right bra strap, Thread/unthread left bra strap, Hook/unhook bra (pull down sports bra) Pull over shirt/dress - Perfomed by patient: Thread/unthread right sleeve, Thread/unthread left sleeve, Put head through opening, Pull shirt over trunk Assist Level: Set up Set up : To obtain clothing/put away Function - Lower Body Dressing/Undressing Lower body dressing/undressing activity did not occur: Refused What is the patient wearing?: Underwear, Pants, Shoes Position: Wheelchair/chair at sink Underwear - Performed by patient: Thread/unthread right underwear leg, Pull underwear up/down, Thread/unthread left underwear leg Underwear - Performed by helper: Thread/unthread left underwear leg Pants- Performed by patient: Thread/unthread right pants leg, Pull pants up/down, Thread/unthread left pants leg Pants- Performed by helper: Thread/unthread left pants leg Shoes - Performed by patient: Don/doff right shoe, Don/doff left shoe Shoes - Performed by helper: Don/doff right shoe, Fasten right TED Hose - Performed by helper: Don/doff right TED hose, Don/doff left TED hose Assist for footwear: Partial/moderate assist Assist for lower body dressing: Touching or steadying assistance (Pt > 75%)  Function - Toileting Toileting steps completed by patient: Adjust clothing prior to toileting, Adjust clothing after toileting, Performs perineal hygiene Toileting steps completed by helper: Performs perineal hygiene, Adjust clothing after toileting(per NT report) Toileting Assistive Devices: Grab bar or rail Assist level: Touching or steadying assistance (Pt.75%)  Function - Air cabin crew transfer assistive device: Grab bar, Walker Assist level to toilet: Touching or  steadying assistance (Pt > 75%) Assist level from toilet: Touching or steadying assistance (Pt > 75%)  Function - Chair/bed transfer Chair/bed transfer method: Stand pivot Chair/bed transfer assist level: Touching or steadying assistance (Pt > 75%) Chair/bed transfer assistive device: Armrests, Bedrails Chair/bed transfer details: Verbal cues for technique  Function - Locomotion: Wheelchair Will patient use wheelchair at discharge?: Yes Type: Manual Max wheelchair distance: 100 ft Assist Level: Supervision or verbal cues Assist Level: Supervision or verbal cues Function - Locomotion: Ambulation Assistive device: Walker-rolling Max distance: 200 ft Assist level: Touching or steadying assistance (Pt > 75%) Assist level: Touching or steadying assistance (Pt > 75%) Walk 50 feet with 2 turns activity did not occur: Safety/medical concerns Assist level: Touching or steadying assistance (Pt > 75%) Walk 150 feet activity did not occur: Safety/medical concerns Assist level: Touching or steadying assistance (Pt > 75%) Walk 10 feet on uneven surfaces activity did not occur: Safety/medical concerns  Function - Comprehension Comprehension: Auditory Comprehension assist level: Follows basic conversation/direction with  no assist  Function - Expression Expression: Verbal Expression assist level: Expresses basic needs/ideas: With extra time/assistive device  Function - Social Interaction Social Interaction assist level: Interacts appropriately with others with medication or extra time (anti-anxiety, antidepressant).  Function - Problem Solving Problem solving assist level: Solves complex 90% of the time/cues < 10% of the time, Solves complex problems: With extra time  Function - Memory Memory assist level: More than reasonable amount of time Patient normally able to recall (first 3 days only): That he or she is in a hospital, Staff names and faces  Medical Problem List and Plan: 1.   Deficits with mobility, ataxia secondary to right cerebellar hemorrhage and infarct s/p evacuation.Some increased RUE dysmetria noted on exam will do f/u CT head, call NS if any changes CIR PT, OT, SLP, Team conference today please see physician documentation under team conference tab, met with team face-to-face to discuss problems,progress, and goals. Formulized individual treatment plan based on medical history, underlying problem and comorbidities.  2.  DVT Prophylaxis/Anticoagulation: Pharmaceutical: Heparin 5000U Q 12h- change to subq Lovenox 3. Pain Management: hydrocodone prn for pain. Discontinue dilaudid.  4. Mood: LCSW to follow for evaluation and support.  5. Neuropsych: This patient is not fully capable of making decisions on her own behalf. 6. Skin/Wound Care: Monitor wound for healing. Maintain adequate nutritional and hydration status.  7. Fluids/Electrolytes/Nutrition: Monitor I/O. Check lytes in am.I 731m 6/13  8. HTN: Monitor BP bid and continue to titrate medications for tighter control as still labile. Will add catapres for prn use.  Apresoline 545mTID, lisinopril 2041mID Vitals:   05/15/18 1937 05/16/18 0404  BP: (!) 137/51 (!) 150/51  Pulse: 73 68  Resp: 18 18  Temp: 98 F (36.7 C) 98 F (36.7 C)  SpO2: 97%15%%94%ystolic elevation 6/15/85d HCTZ 12.5mg57m 6/14,in range 6/18 9. Low calorie malnutrition:  Check magnesium/phosphorus levels in am. Add protein supplements.  10. Pre diabetes: Hgb A1C- 5.8  11. Hypokalemia: Supplement today with Kdur and Mag Ox. Improved 6/13 12. Dizziness:Central vestibular dysfunction overall improving symptoms worsen with head movements  LOS (Days) 9 A FACE TO FACE EVALUATION WAS PERFORMED  AndrCharlett Blake9/2019, 9:28 AM

## 2018-05-16 NOTE — Plan of Care (Signed)
IADL goals d/c as not a priority at this time. Danali Marinos, OTR/L

## 2018-05-17 ENCOUNTER — Inpatient Hospital Stay (HOSPITAL_COMMUNITY): Payer: Medicare Other | Admitting: Occupational Therapy

## 2018-05-17 ENCOUNTER — Inpatient Hospital Stay (HOSPITAL_COMMUNITY): Payer: Medicare Other | Admitting: Speech Pathology

## 2018-05-17 ENCOUNTER — Inpatient Hospital Stay (HOSPITAL_COMMUNITY): Payer: Medicare Other

## 2018-05-17 LAB — BASIC METABOLIC PANEL
Anion gap: 7 (ref 5–15)
BUN: 26 mg/dL — AB (ref 6–20)
CO2: 27 mmol/L (ref 22–32)
CREATININE: 1.18 mg/dL — AB (ref 0.44–1.00)
Calcium: 9.3 mg/dL (ref 8.9–10.3)
Chloride: 102 mmol/L (ref 101–111)
GFR calc Af Amer: 48 mL/min — ABNORMAL LOW (ref >60)
GFR calc non Af Amer: 41 mL/min — ABNORMAL LOW (ref >60)
Glucose, Bld: 106 mg/dL — ABNORMAL HIGH (ref 65–99)
POTASSIUM: 4.4 mmol/L (ref 3.5–5.1)
SODIUM: 136 mmol/L (ref 135–145)

## 2018-05-17 LAB — GLUCOSE, CAPILLARY
GLUCOSE-CAPILLARY: 105 mg/dL — AB (ref 65–99)
Glucose-Capillary: 102 mg/dL — ABNORMAL HIGH (ref 65–99)
Glucose-Capillary: 110 mg/dL — ABNORMAL HIGH (ref 65–99)

## 2018-05-17 NOTE — Progress Notes (Signed)
Speech Language Pathology Daily Session Note  Patient Details  Name: Tiffany Thornton MRN: [id number] Date of Birth: August 02, 1933  Today's Date: 05/17/2018 SLP Individual Time: 0830-0930 SLP Individual Time Calculation (min): 60 min  Short Term Goals: Week 2: SLP Short Term Goal 1 (Week 2): Pt will demonstrate selective attention in mildly distractioning enviorment to functional task for 45 minutes with supervision A verbal cues for redirection. SLP Short Term Goal 2 (Week 2): Pt will demonstrate 2 safety precuations during functional tasks with supervision A verbal cues.  SLP Short Term Goal 3 (Week 2): Pt will utilize external memory aid to recall new, daily information with Supervision A verbal cues.  SLP Short Term Goal 4 (Week 2): Pt will utilize speech intelligibility strategies at the sentence level with supervision A verbal cues to achieve 80% intelligibility.  SLP Short Term Goal 5 (Week 2): Pt will complete semi-complex functional problem solving tasks with supervision A question and verbal cues. SLP Short Term Goal 6 (Week 2): Pt will demonstrate self-monitoring and self-correction in functional problem solving tasks with supervision A verbal and question cues.  Skilled Therapeutic Interventions: Skilled treatment session focused on cognition goals. SLP facilitated session by providing supervision cues to complete calendar task. Pt with increased self-monitoring and ability to correct errors within moment. With Mod A cues, pt is able to identify deficits related to CVA and acknowledges that she will need help at discharge but dislikes idea of needing support. With supervision cues, pt was able to demonstrate selective attention to task for ~ 60 minutes in moderately distracting environment. Pt was returned to room, transferred to bed, bed alarm on d/t pt's comment that she didn't know if she would get out of bed without help.      Function:  Eating Eating   Modified Consistency Diet:  No Eating Assist Level: Set up assist for   Eating Set Up Assist For: Opening containers       Cognition Comprehension Comprehension assist level: Follows basic conversation/direction with no assist;Understands complex 90% of the time/cues 10% of the time  Expression   Expression assist level: Expresses basic needs/ideas: With no assist  Social Interaction Social Interaction assist level: Interacts appropriately with others with medication or extra time (anti-anxiety, antidepressant).  Problem Solving Problem solving assist level: Solves complex 90% of the time/cues < 10% of the time  Memory Memory assist level: More than reasonable amount of time    Pain    Therapy/Group: Individual Therapy  Shyenne Maggard 05/17/2018, 12:19 PM

## 2018-05-17 NOTE — Progress Notes (Signed)
Social Work Patient ID: Tiffany Thornton, female   DOB: [birth year], 84 y.o.   MRN: [id number]   CSW met with pt and then spoke with her son, Kambree Krauss, via telephone to update them on team conference discussion.  Pt is not wanting 24/7 supervision, but she seems to understand why she needs it now.  Pt's family is pulling together to take turns staying the night with pt and then her great granddtr will be with her during the day.  CSW to order DME and arrange f/u therapies as recommended by team.  CSW will put patient accounting information in pt's d/c instructions, as pt is concerned about her bill and wants to set up a payment plan.  CSW will continue to follow and assist as needed.

## 2018-05-17 NOTE — Patient Care Conference (Signed)
Inpatient RehabilitationTeam Conference and Plan of Care Update Date: 05/16/2018   Time: 10:40 AM    Patient Name: Tiffany Thornton      Medical Record Number: [id number]  Date of Birth: [birth year] Sex: Female         Room/Bed: 4W20C/4W20C-01 Payor Info: Payor: [employer]vertising copy[employer] MEDICARE / Plan: [employer] MEDICARE / Product Type: *No Product type* /    Admitting Diagnosis: ICH  Admit Date/Time:  05/07/2018  5:04 PM Admission Comments: No comment available   Primary Diagnosis:  <principal problem not specified> Principal Problem: <principal problem not specified>  Patient Active Problem List   Diagnosis Date Noted  . Cerebellar hemorrhage, nontraumatic (HCC) 05/07/2018  . Ataxia, post-stroke   . Essential hypertension   . Prediabetes   . Hypokalemia   . Dizziness and giddiness   . Cognitive deficit, post-stroke   . Hypertensive emergency   . Hyperlipidemia   . Cerebellar edema (HCC)   . Leukocytosis   . S/P craniotomy 04/30/2018  . 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 Claudette Laws Social Worker Present: Tiffany Acosta, LCSW Nurse Present: Tiffany Arnold, RN PT Present: Tiffany Thornton, PT OT Present: Tiffany Thornton, OT SLP Present: Tiffany Thornton, SLP PPS Coordinator present : Tiffany Duck, RN, CRRN     Current Status/Progress Goal Weekly Team Focus  Medical   Pt with increased RUE dysmetria  Reduce fall risk, improve awareness of deficit  Repeat CT head   Bowel/Bladder   continent of b/b, LBM 6/18, up to BR with min A  maintain b/b with min A  monitor q shift and prn, toilet q 2-3 hours while awake   Swallow/Nutrition/ Hydration             ADL's   Close supervision-steadying assist overall  Supervision overall, IADL goals have been d/c  ADL re-training, neuro re-ed, family ed, d/c planning   Mobility   min assist  overall with RW, poor insight of deficits, poor endurance, ongoing vestibuar issues  supervision overall with LRAD  NMR, balance, coordination, transfers, gait, stair negotiation, strength/endurance, pt/family education   Communication   Min- supervision A  Supervision A  speech intelligibility strategies and education on xerostomia   Safety/Cognition/ Behavioral Observations  Min A  Supervision A  semi-complex problem solving, error awareness, safety awareness, selective attention, eductaion    Pain   c/o pain to left hip/back, has k pad, uses tylenol prn. on 6/18 c/o headache.  pain <4  assess and treat pain q shift and prn.   Skin   sutures to back of head, CDI, OTA  maintain skin integrity with mod A  monitor skin q shift and prn    Rehab Goals Patient on target to meet rehab goals: Yes Rehab Goals Revised: none *See Care Plan and progress notes for long and short-term goals.     Barriers to Discharge  Current Status/Progress Possible Resolutions Date Resolved   Physician    Medical stability;Other (comments)     progressing toward goals  cont rehab      Nursing                  PT  Decreased caregiver support  recommending 24 hr supervision upon d/c              OT  SLP                SW                Discharge Planning/Teaching Needs:  Pt to return to her home with family to provide 24/7 supervision, even though pt does not want this, she is now understanding and accepting for a short time.  Family education to occur this weekend.   Team Discussion:  Pt is more ataxic on her her right side and will have a MRI to rule out any further injury.  Pt asking RN if CBGs can be stopped and Dr. Wynn BankerKirsteins will review and decide.  Pt can have sutures removed and is continent of bowel and bladder.  Pt is doing well in therapy at min A, close to S.  Pt's endurance limits her in therapy.  Pt's dtr seems to understand the recommendation of 24/7 S, even if pt does not  want it.  Pt was close S with PT prior to becoming more ataxic.  Pt is min A with ST and they will upgrade goals with more complex tasks.  Pt has S goals and her endurance and behavior limit her.  Revisions to Treatment Plan:  none    Continued Need for Acute Rehabilitation Level of Care: The patient requires daily medical management by a physician with specialized training in physical medicine and rehabilitation for the following conditions: Daily direction of a multidisciplinary physical rehabilitation program to ensure safe treatment while eliciting the highest outcome that is of practical value to the patient.: Yes Daily analysis of laboratory values and/or radiology reports with any subsequent need for medication adjustment of medical intervention for : Post surgical problems;Neurological problems  Navdeep Fessenden, Vista DeckJennifer Capps 05/17/2018, 9:13 PM

## 2018-05-17 NOTE — Progress Notes (Signed)
Subjective/Complaints: No new issues overnight.  Feel feels clumsy with right upper extremity  ROS- neg CP, SOB, +nausea, no vomiting no abd pain  Objective: Vital Signs: Blood pressure (!) 126/53, pulse 72, temperature 98.9 F (37.2 C), temperature source Oral, resp. rate 16, height _0  (1.702 m), weight 62.3 kg (137 lb 5.6 oz), SpO2 94 %. Ct Head Wo Contrast  Result Date: 05/16/2018 CLINICAL DATA:  Intracranial hemorrhage, known, follow-up EXAM: CT HEAD WITHOUT CONTRAST TECHNIQUE: Contiguous axial images were obtained from the base of the skull through the vertex without intravenous contrast. COMPARISON:  04/30/2018 head CT.  05/05/2018 brain MRI. FINDINGS: Brain: Primarily low-density seen in place of the previously noted right cerebellar hematoma. There was surrounding infarct on interval MRI. No residual mass effect or high-density blood products. Chronic small vessel ischemic type change in the cerebral white matter. No evidence of interval infarct. No hydrocephalus or collection. Vascular: Atherosclerotic calcification. Skull: Suboccipital craniectomy. Mild CSF density in the suboccipital region, without regional mass effect. Sinuses/Orbits: Negative IMPRESSION: 1. Recent right cerebellar hemorrhage and evacuation. No recurrent hemorrhage or mass effect. No hydrocephalus. 2. Stable suboccipital fluid collection. Electronically Signed   By: Monte Fantasia M.D.   On: 05/16/2018 14:14   Results for orders placed or performed during the hospital encounter of 05/07/18 (from the past 72 hour(s))  Glucose, capillary     Status: None   Collection Time: 05/14/18  4:39 PM  Result Value Ref Range   Glucose-Capillary 93 65 - 99 mg/dL  Glucose, capillary     Status: None   Collection Time: 05/14/18  9:01 PM  Result Value Ref Range   Glucose-Capillary 94 65 - 99 mg/dL  Glucose, capillary     Status: Abnormal   Collection Time: 05/15/18  6:25 AM  Result Value Ref Range   Glucose-Capillary 106  (H) 65 - 99 mg/dL  Glucose, capillary     Status: Abnormal   Collection Time: 05/15/18 12:00 PM  Result Value Ref Range   Glucose-Capillary 105 (H) 65 - 99 mg/dL  Glucose, capillary     Status: Abnormal   Collection Time: 05/15/18  4:39 PM  Result Value Ref Range   Glucose-Capillary 115 (H) 65 - 99 mg/dL  Glucose, capillary     Status: None   Collection Time: 05/15/18  8:48 PM  Result Value Ref Range   Glucose-Capillary 89 65 - 99 mg/dL  Glucose, capillary     Status: None   Collection Time: 05/16/18  6:40 AM  Result Value Ref Range   Glucose-Capillary 92 65 - 99 mg/dL  Glucose, capillary     Status: Abnormal   Collection Time: 05/16/18 12:02 PM  Result Value Ref Range   Glucose-Capillary 112 (H) 65 - 99 mg/dL  Glucose, capillary     Status: Abnormal   Collection Time: 05/16/18  4:33 PM  Result Value Ref Range   Glucose-Capillary 111 (H) 65 - 99 mg/dL  Glucose, capillary     Status: Abnormal   Collection Time: 05/16/18  9:14 PM  Result Value Ref Range   Glucose-Capillary 128 (H) 65 - 99 mg/dL   Comment 1 Notify RN   Basic metabolic panel     Status: Abnormal   Collection Time: 05/17/18  5:36 AM  Result Value Ref Range   Sodium 136 135 - 145 mmol/L   Potassium 4.4 3.5 - 5.1 mmol/L   Chloride 102 101 - 111 mmol/L   CO2 27 22 - 32 mmol/L   Glucose, Bld  106 (H) 65 - 99 mg/dL   BUN 26 (H) 6 - 20 mg/dL   Creatinine, Ser 1.18 (H) 0.44 - 1.00 mg/dL   Calcium 9.3 8.9 - 10.3 mg/dL   GFR calc non Af Amer 41 (L) >60 mL/min   GFR calc Af Amer 48 (L) >60 mL/min    Comment: (NOTE) The eGFR has been calculated using the CKD EPI equation. This calculation has not been validated in all clinical situations. eGFR's persistently <60 mL/min signify possible Chronic Kidney Disease.    Anion gap 7 5 - 15    Comment: Performed at [facility] [address] [place], [place] [address]  Glucose, capillary     Status: Abnormal   Collection Time: 05/17/18  6:27 AM  Result Value Ref  Range   Glucose-Capillary 105 (H) 65 - 99 mg/dL  Glucose, capillary     Status: Abnormal   Collection Time: 05/17/18 11:54 AM  Result Value Ref Range   Glucose-Capillary 102 (H) 65 - 99 mg/dL     HEENT: normal Cardio: RRR and no murmur Resp: CTA B/L and Unlabored GI: BS positive and NT, ND Extremity:  No Edema Skin:   Bruise diffuse ecchymosis in BUE and BLE Neuro: Alert/Oriented, Anxious, Normal Sensory, Normal Motor, Abnormal FMC Ataxic/ dec FMC and  Moderate limb ataxia with FNF and HS on RIght side, no dysmetria on Left side Musc/Skel:  Other no pain with UE or LE ROM Gen NAD   Assessment/Plan: 1. 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 Physiatrist is providing close team supervision and 24 hour management of active medical problems listed below. Physiatrist and rehab team continue to assess barriers to discharge/monitor patient progress toward functional and medical goals. FIM: Function - Bathing Bathing activity did not occur: Refused Position: Shower Body parts bathed by patient: Right arm, Left arm, Chest, Abdomen, Front perineal area, Buttocks, Right upper leg, Left upper leg, Right lower leg, Left lower leg Body parts bathed by helper: Back Assist Level: Supervision or verbal cues  Function- Upper Body Dressing/Undressing Upper body dressing/undressing activity did not occur: Refused What is the patient wearing?: Bra, Pull over shirt/dress Bra - Perfomed by patient: Thread/unthread right bra strap, Thread/unthread left bra strap, Hook/unhook bra (pull down sports bra) Pull over shirt/dress - Perfomed by patient: Thread/unthread right sleeve, Thread/unthread left sleeve, Put head through opening, Pull shirt over trunk Assist Level: Set up Set up : To obtain clothing/put away Function - Lower Body  Dressing/Undressing Lower body dressing/undressing activity did not occur: Refused What is the patient wearing?: Underwear, Pants, Shoes Position: Wheelchair/chair at sink Underwear - Performed by patient: Thread/unthread right underwear leg, Pull underwear up/down, Thread/unthread left underwear leg Underwear - Performed by helper: Thread/unthread left underwear leg Pants- Performed by patient: Thread/unthread right pants leg, Pull pants up/down, Thread/unthread left pants leg Pants- Performed by helper: Thread/unthread left pants leg Shoes - Performed by patient: Don/doff right shoe, Don/doff left shoe Shoes - Performed by helper: Don/doff right shoe, Fasten right TED Hose - Performed by helper: Don/doff right TED hose, Don/doff left TED hose Assist for footwear: Supervision/touching assist Assist for lower body dressing: Supervision or verbal cues  Function - Toileting Toileting steps completed by patient: Adjust clothing prior to toileting, Adjust clothing after toileting, Performs perineal hygiene Toileting steps completed by helper: Performs perineal hygiene, Adjust clothing after toileting(per NT report) Geologist, engineering  Devices: Grab bar or rail Assist level: Touching or steadying assistance (Pt.75%)  Function - Air cabin crew transfer assistive device: Grab bar, Walker, Bedside commode Assist level to toilet: Supervision or verbal cues Assist level from toilet: Supervision or verbal cues  Function - Chair/bed transfer Chair/bed transfer method: Stand pivot Chair/bed transfer assist level: Touching or steadying assistance (Pt > 75%) Chair/bed transfer assistive device: Armrests, Bedrails Chair/bed transfer details: Verbal cues for technique  Function - Locomotion: Wheelchair Will patient use wheelchair at discharge?: Yes Type: Manual Max wheelchair distance: 100 ft Assist Level: Supervision or verbal cues Assist Level: Supervision or verbal cues Function -  Locomotion: Ambulation Assistive device: Walker-rolling Max distance: 100 ft Assist level: Supervision or verbal cues Assist level: Supervision or verbal cues Walk 50 feet with 2 turns activity did not occur: Safety/medical concerns Assist level: Supervision or verbal cues Walk 150 feet activity did not occur: Safety/medical concerns Assist level: Touching or steadying assistance (Pt > 75%) Walk 10 feet on uneven surfaces activity did not occur: Safety/medical concerns  Function - Comprehension Comprehension: Auditory Comprehension assist level: Follows basic conversation/direction with no assist, Understands complex 90% of the time/cues 10% of the time  Function - Expression Expression: Verbal Expression assist level: Expresses basic needs/ideas: With no assist  Function - Social Interaction Social Interaction assist level: Interacts appropriately with others with medication or extra time (anti-anxiety, antidepressant).  Function - Problem Solving Problem solving assist level: Solves complex 90% of the time/cues < 10% of the time  Function - Memory Memory assist level: More than reasonable amount of time Patient normally able to recall (first 3 days only): That he or she is in a hospital, Staff names and faces, Location of own room, Current season  Medical Problem List and Plan: 1.  Deficits with mobility, ataxia secondary to right cerebellar hemorrhage and infarct s/p evacuation.Some increased RUE dysmetria noted on exam will do f/u CT head, call NS if any changes CIR PT, OT, SLP,2.  DVT Prophylaxis/Anticoagulation: subq Lovenox 3. Pain Management: hydrocodone prn for pain. Discontinue dilaudid.  4. Mood: LCSW to follow for evaluation and support.  5. Neuropsych: This patient is not fully capable of making decisions on her own behalf. 6. Skin/Wound Care: Monitor wound for healing. Maintain adequate nutritional and hydration status.  7. Fluids/Electrolytes/Nutrition: Monitor I/O.  Check lytes in am.I 836m 6/19  8. HTN: Monitor BP bid and continue to titrate medications for tighter control as still labile. Will add catapres for prn use.  Apresoline 534mTID, lisinopril 2056mID Vitals:   05/17/18 0445 05/17/18 1410  BP: (!) 129/50 (!) 126/53  Pulse: 66 72  Resp: 15 16  Temp: 98.9 F (37.2 C)   SpO2: 96% 94%  Controlled 6/20 9. Low calorie malnutrition:  Check magnesium/phosphorus levels in am. Add protein supplements.  10. Pre diabetes: Hgb A1C- 5.8  11. Hypokalemia: Supplement today with Kdur and Mag Ox. Improved 6/13 12. Dizziness:Central vestibular dysfunction overall improving symptoms worsen with head movements  LOS (Days) 10 A FACE TO FACE EVALUATION WAS PERFORMED  AndCharlett Blake20/2019, 4:21 PM

## 2018-05-17 NOTE — Progress Notes (Signed)
Occupational Therapy Session Note  Patient Details  Name: Tiffany Thornton MRN: [id number] Date of Birth: 1933/02/14  Today's Date: 05/17/2018 OT Individual Time: [id number] and 1430-1500 OT Individual Time Calculation (min): 55 min and 30 min   Short Term Goals: Week 2:  OT Short Term Goal 1 (Week 2): STG=LTG due to LOS  Skilled Therapeutic Interventions/Progress Updates:    Session One:Pt seen for OT ADL bathing/dressing session. Pt sitting up in bed upon arrival, awake with son present. Son left prior to beginning of tx session. She declined shower this AM opting to complete sponge bathing seated in chair. Cont to demonstrate R UE ataxia when reaching for self care items requiring increased time and concentration to use R UE at non-dominant level. She dressed sit<> stand at sink with supervision. Rest breaks required throughout bathing/dressing session due to decreased functional activity tolerance. Pt taken to ADL apartment total A in w/c. Completed functional transfers from standard bed> BSC via stand pivot with RW. Completed x3 trials with distant supervision-mod I level. Practiced simulated shower stall transfer using RW to [facility] in shower. Completed with supervision following demonstration and VCs for technique.  Pt returned to room at end of session, left seated in w/c awaiting hand off to SLP. Discussed at length DME recommendations and set-up for use at d/c. Recommend use of BSC at bedside at night in order to reduce fall risk. Also recommend use of BSC in walk in shower for seated bathing task in order to reduce risk of fall and for energy conservation.   Session Two: Pt seen for OT session focusing on neuro re-ed with fine motor coordination in R UE. Pt asleep in supine upon arrival, room dark and pt with washcloth over face. Easily awoken, stating she had vomited following lunch, however, willing to participate in therapy as able. She declined leaving room, opting to work seated  EOB. Completed fine motor activities utilizing foam blocks with pt required to pick up small items and place in target in various fields. Pt required to reach across midline and bend down to floor to place items with emphasis on visual stabilization as part of vestibular HEP. Pt tolerated well and able to hit targets with slightly increased time and concentration. She then cont with fine motor tasks utilzing self care items, removing caps from small bottles, placing on table and then replacing on lids. Alternated using R UE at stabilizer and dominant level mod I. Then practiced removing and replacing shoelaces to cont with fine motor manipulation and control, able to complete with increased time. Pt declined transfer to OOB at end of session, opting to stay sitting up in bed. Pt left in supine with all needs in reach, bed alarm on.     Therapy Documentation Precautions:  Precautions Precautions: Fall Restrictions Weight Bearing Restrictions: No Pain:   No/denies pain  See Function Navigator for Current Functional Status.   Therapy/Group: Individual Therapy  Manual Navarra L 05/17/2018, 7:03 AM

## 2018-05-17 NOTE — Progress Notes (Addendum)
Physical Therapy Session Note  Patient Details  Name: Tiffany Thornton MRN: [id number] Date of Birth: [birth year]  Today's Date: 05/17/2018 PT Individual Time: 1100-1200 PT Individual Time Calculation (min): 60 min   Short Term Goals: Week 1:  PT Short Term Goal 1 (Week 1): Pt will perform stand pivot transfers with LRAD with min A PT Short Term Goal 1 - Progress (Week 1): Met PT Short Term Goal 2 (Week 1): Pt will ambulate 50 feet with LRAD and min A PT Short Term Goal 2 - Progress (Week 1): Met PT Short Term Goal 3 (Week 1): Pt will perform 8 steps with min A PT Short Term Goal 3 - Progress (Week 1): Progressing toward goal Week 2:  PT Short Term Goal 2 (Week 2): STG = LTG due to estimated ELOS.     Skilled Therapeutic Interventions/Progress Updates:   Pt supine in bed with HOB raised.  Pt stated that she was exhausted from coughing.  Pt with congested, productive cough, eventually coughing up orange mass that Santiago Glad, RN stated appeared to be one of pt's meds. Pt continued to cough intermittently throughout session, clear mucus.   PT raised HOB to sitting position,; neuromuscular re-education via multimodal cues for saccades x 30 seconds x 3, and gaze stabilization exs using letter target, x 30 seconds x 3.  Pt c/o vertigo, but not nausea.   With lowered HOB to a point that pt could tolerate without increased coughing; neuro re-ed for R/LLEs.  1 x 10 bil bridging, R/L straight leg raises, bil bridging with adductor squeezes on towel roll, 2 x 10 bil lower trunk rotation with adductor squeeze on towel roll. . PT cued pt to focus on decreasing ataxia with movements.  Rest breaks needed between bouts.   Pt instructed pt in diaphragmatic breathing.  She tends to hold breath during exs, and benefits from counting aloud to prevent this during exertion.   PT set pt up for lunch, with HOB raises to maximum point.  Alarm set and needs left at hand.     Therapy Documentation Precautions:   Precautions Precautions: Fall Restrictions Weight Bearing Restrictions: No   Vital Signs:  Pain: none per pt     See Function Navigator for Current Functional Status.   Therapy/Group: Individual Therapy  Alaycia Eardley 05/17/2018, 12:12 PM

## 2018-05-18 ENCOUNTER — Inpatient Hospital Stay (HOSPITAL_COMMUNITY): Payer: Medicare Other | Admitting: Occupational Therapy

## 2018-05-18 ENCOUNTER — Inpatient Hospital Stay (HOSPITAL_COMMUNITY): Payer: Medicare Other | Admitting: Speech Pathology

## 2018-05-18 ENCOUNTER — Inpatient Hospital Stay (HOSPITAL_COMMUNITY): Payer: Medicare Other | Admitting: Physical Therapy

## 2018-05-18 MED ORDER — CARBAMIDE PEROXIDE 6.5 % OT SOLN
5.0000 [drp] | Freq: Two times a day (BID) | OTIC | Status: AC
  Administered 2018-05-18 – 2018-05-21 (×8): 5 [drp] via OTIC
  Filled 2018-05-18: qty 15

## 2018-05-18 NOTE — Progress Notes (Signed)
Subjective/Complaints: Appreciate SW note, pt asks same questions everyday, "I'll be fine when I go home , right?" Reinforced that pt will need asisstance and will not be driving or mowing lawn   ROS- neg CP, SOB, +nausea, no vomiting no abd pain  Objective: Vital Signs: Blood pressure (!) 150/47, pulse 65, temperature 98 F (36.7 C), temperature source Oral, resp. rate 18, height _0  (1.702 m), weight 62.3 kg (137 lb 5.6 oz), SpO2 95 %. Ct Head Wo Contrast  Result Date: 05/16/2018 CLINICAL DATA:  Intracranial hemorrhage, known, follow-up EXAM: CT HEAD WITHOUT CONTRAST TECHNIQUE: Contiguous axial images were obtained from the base of the skull through the vertex without intravenous contrast. COMPARISON:  04/30/2018 head CT.  05/05/2018 brain MRI. FINDINGS: Brain: Primarily low-density seen in place of the previously noted right cerebellar hematoma. There was surrounding infarct on interval MRI. No residual mass effect or high-density blood products. Chronic small vessel ischemic type change in the cerebral white matter. No evidence of interval infarct. No hydrocephalus or collection. Vascular: Atherosclerotic calcification. Skull: Suboccipital craniectomy. Mild CSF density in the suboccipital region, without regional mass effect. Sinuses/Orbits: Negative IMPRESSION: 1. Recent right cerebellar hemorrhage and evacuation. No recurrent hemorrhage or mass effect. No hydrocephalus. 2. Stable suboccipital fluid collection. Electronically Signed   By: Monte Fantasia M.D.   On: 05/16/2018 14:14   Results for orders placed or performed during the hospital encounter of 05/07/18 (from the past 72 hour(s))  Glucose, capillary     Status: Abnormal   Collection Time: 05/15/18  4:39 PM  Result Value Ref Range   Glucose-Capillary 115 (H) 65 - 99 mg/dL  Glucose, capillary     Status: None   Collection Time: 05/15/18  8:48 PM  Result Value Ref Range   Glucose-Capillary 89 65 - 99 mg/dL  Glucose, capillary      Status: None   Collection Time: 05/16/18  6:40 AM  Result Value Ref Range   Glucose-Capillary 92 65 - 99 mg/dL  Glucose, capillary     Status: Abnormal   Collection Time: 05/16/18 12:02 PM  Result Value Ref Range   Glucose-Capillary 112 (H) 65 - 99 mg/dL  Glucose, capillary     Status: Abnormal   Collection Time: 05/16/18  4:33 PM  Result Value Ref Range   Glucose-Capillary 111 (H) 65 - 99 mg/dL  Glucose, capillary     Status: Abnormal   Collection Time: 05/16/18  9:14 PM  Result Value Ref Range   Glucose-Capillary 128 (H) 65 - 99 mg/dL   Comment 1 Notify RN   Basic metabolic panel     Status: Abnormal   Collection Time: 05/17/18  5:36 AM  Result Value Ref Range   Sodium 136 135 - 145 mmol/L   Potassium 4.4 3.5 - 5.1 mmol/L   Chloride 102 101 - 111 mmol/L   CO2 27 22 - 32 mmol/L   Glucose, Bld 106 (H) 65 - 99 mg/dL   BUN 26 (H) 6 - 20 mg/dL   Creatinine, Ser 1.18 (H) 0.44 - 1.00 mg/dL   Calcium 9.3 8.9 - 10.3 mg/dL   GFR calc non Af Amer 41 (L) >60 mL/min   GFR calc Af Amer 48 (L) >60 mL/min    Comment: (NOTE) The eGFR has been calculated using the CKD EPI equation. This calculation has not been validated in all clinical situations. eGFR's persistently <60 mL/min signify possible Chronic Kidney Disease.    Anion gap 7 5 - 15    Comment:  Performed at [facility], [place] [address], [place], [place] [address]  Glucose, capillary     Status: Abnormal   Collection Time: 05/17/18  6:27 AM  Result Value Ref Range   Glucose-Capillary 105 (H) 65 - 99 mg/dL  Glucose, capillary     Status: Abnormal   Collection Time: 05/17/18 11:54 AM  Result Value Ref Range   Glucose-Capillary 102 (H) 65 - 99 mg/dL  Glucose, capillary     Status: Abnormal   Collection Time: 05/17/18  9:36 PM  Result Value Ref Range   Glucose-Capillary 110 (H) 65 - 99 mg/dL     HEENT: normal Cardio: RRR and no murmur Resp: CTA B/L and Unlabored GI: BS positive and NT, ND Extremity:  No  Edema Skin:   Bruise diffuse ecchymosis in BUE and BLE Neuro: Alert/Oriented, Anxious, Normal Sensory, Normal Motor, Abnormal FMC Ataxic/ dec FMC and  Moderate limb ataxia with FNF and HS on RIght side, no dysmetria on Left side Musc/Skel:  Other no pain with UE or LE ROM Gen NAD   Assessment/Plan: 1. 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 Physiatrist is providing close team supervision and 24 hour management of active medical problems listed below. Physiatrist and rehab team continue to assess barriers to discharge/monitor patient progress toward functional and medical goals. FIM: Function - Bathing Bathing activity did not occur: Refused Position: Shower Body parts bathed by patient: Right arm, Left arm, Chest, Abdomen, Front perineal area, Buttocks, Right upper leg, Left upper leg, Right lower leg, Left lower leg, Back Body parts bathed by helper: Back Assist Level: Supervision or verbal cues  Function- Upper Body Dressing/Undressing Upper body dressing/undressing activity did not occur: Refused What is the patient wearing?: Bra, Pull over shirt/dress Bra - Perfomed by patient: Thread/unthread right bra strap, Thread/unthread left bra strap, Hook/unhook bra (pull down sports bra) Pull over shirt/dress - Perfomed by patient: Thread/unthread right sleeve, Thread/unthread left sleeve, Put head through opening, Pull shirt over trunk Assist Level: Set up Set up : To obtain clothing/put away Function - Lower Body Dressing/Undressing Lower body dressing/undressing activity did not occur: Refused What is the patient wearing?: Underwear, Pants, Shoes Position: Wheelchair/chair at sink Underwear - Performed by patient: Thread/unthread right underwear leg, Pull underwear up/down, Thread/unthread left underwear leg Underwear - Performed by  helper: Thread/unthread left underwear leg Pants- Performed by patient: Thread/unthread right pants leg, Pull pants up/down, Thread/unthread left pants leg Pants- Performed by helper: Thread/unthread left pants leg Shoes - Performed by patient: Don/doff right shoe, Don/doff left shoe(elastic shoelaces) Shoes - Performed by helper: Don/doff right shoe, Fasten right TED Hose - Performed by helper: Don/doff right TED hose, Don/doff left TED hose Assist for footwear: Supervision/touching assist Assist for lower body dressing: Supervision or verbal cues  Function - Toileting Toileting steps completed by patient: Adjust clothing prior to toileting, Adjust clothing after toileting, Performs perineal hygiene Toileting steps completed by helper: Performs perineal hygiene, Adjust clothing after toileting(per NT report) Geologist, engineering Devices: Grab bar or rail Assist level: Supervision or verbal cues  Function - Air cabin crew transfer assistive device: Grab bar, Walker, Elevated toilet seat/BSC over toilet Assist level to toilet: Supervision or verbal cues Assist level from toilet: Supervision or verbal cues  Function - Chair/bed transfer Chair/bed transfer method: Stand pivot Chair/bed transfer assist level: Touching or steadying assistance (Pt > 75%) Chair/bed transfer assistive  device: Armrests Chair/bed transfer details: Verbal cues for technique  Function - Locomotion: Wheelchair Will patient use wheelchair at discharge?: Yes Type: Manual Max wheelchair distance: 150' Assist Level: Supervision or verbal cues Assist Level: Supervision or verbal cues Assist Level: Supervision or verbal cues Turns around,maneuvers to table,bed, and toilet,negotiates 3% grade,maneuvers on rugs and over doorsills: No Function - Locomotion: Ambulation Assistive device: Walker-rolling Max distance: 180' Assist level: Supervision or verbal cues Assist level: Supervision or verbal cues Walk 50  feet with 2 turns activity did not occur: Safety/medical concerns Assist level: Supervision or verbal cues Walk 150 feet activity did not occur: Safety/medical concerns Assist level: Supervision or verbal cues Walk 10 feet on uneven surfaces activity did not occur: Safety/medical concerns  Function - Comprehension Comprehension: Auditory Comprehension assist level: Follows basic conversation/direction with no assist  Function - Expression Expression: Verbal Expression assist level: Expresses basic needs/ideas: With extra time/assistive device  Function - Social Interaction Social Interaction assist level: Interacts appropriately with others with medication or extra time (anti-anxiety, antidepressant).  Function - Problem Solving Problem solving assist level: Solves complex 90% of the time/cues < 10% of the time, Solves complex problems: With extra time  Function - Memory Memory assist level: More than reasonable amount of time Patient normally able to recall (first 3 days only): That he or she is in a hospital, Staff names and faces, Location of own room, Current season  Medical Problem List and Plan: 1.  Deficits with mobility, ataxia secondary to right cerebellar hemorrhage and infarct s/p evacuation.CT unchanged no new bleeding CIR PT, OT, SLP,2.  DVT Prophylaxis/Anticoagulation: subq Lovenox, may continue 3. Pain Management: hydrocodone prn for pain. Discontinue dilaudid.  4. Mood: LCSW to follow for evaluation and support.  5. Neuropsych: This patient is not fully capable of making decisions on her own behalf. 6. Skin/Wound Care: Monitor wound for healing. Maintain adequate nutritional and hydration status.  7. Fluids/Electrolytes/Nutrition: Monitor I/O. Check lytes in am.I 846m 6/19  8. HTN: Monitor BP bid and continue to titrate medications for tighter control as still labile. Will add catapres for prn use.  Apresoline 548mTID, lisinopril 2053mID Vitals:   05/17/18 2017  05/18/18 0500  BP: (!) 140/52 (!) 150/47  Pulse: 73 65  Resp: 20 18  Temp: 97.9 F (36.6 C) 98 F (36.7 C)  SpO2: 95% 95%  Controlled 6/21     9. Low calorie malnutrition:  Check magnesium/phosphorus levels in am. Add protein supplements.  10. Pre diabetes: Hgb A1C- 5.8  11. Hypokalemia: Supplement today with Kdur and Mag Ox. Improved 6/13 12. Dizziness:Central vestibular dysfunction overall improving ,head movements still can provoke symptoms  LOS (Days) 11 A FACE TO FACE EVALUATION WAS PERFORMED  AndCharlett Blake21/2019, 12:45 PM

## 2018-05-18 NOTE — Progress Notes (Signed)
Occupational Therapy Session Note  Patient Details  Name: Tiffany Thornton MRN: [id number] Date of Birth: 1933-05-17  Today's Date: 05/18/2018 OT Individual Time: [id number] and 1430-1500 OT Individual Time Calculation (min): 60 min and 30 min   Short Term Goals: Week 2:  OT Short Term Goal 1 (Week 2): STG=LTG due to LOS  Skilled Therapeutic Interventions/Progress Updates:    Session One: Pt seen for OT ADL bathing/dressing session. Pt awake in supine upon arrival, agreeable to tx session and denying pain. Requesting to bathe at shower level this morning. She ambulated throughout session with RW and close supervision, demonstrating improved RW management from previous sessions, requiring only min cuing today. She bathed seated on tub bench, standing with use of grab bars to complete pericare/buttock hygiene. She required min wit one episode of mod A steadying assist to prevent LOB when attempting to dry off in standing position without UE support. Cont to provide education of completing dynamic tasks in sitting in order to reduce risk of fall and for energy conservation. Pt cont to demonstrate decreased safety awareness and poor awareness of deficits in functional tasks.  She returned to chair to dress, requiring rest breaks throughout due to decreased functional activity tolerance. Stood at [employer] to complete Acupuncturistclothing management with supervision.  Completed grooming tasks standing at sink with supervision.  Following seated rest break, pt ambulated to therapy day room with supervision using RW. Ate breakfast seated in day room, using R UE at dominant level and able to complete all set-up of items.  Discussed at length d/c planning, return to activity, and safety awareness.  Completed fine motor activity at table top level with emphasis on R UE use and coordination of movements. Pt left seated in day room at end of session to rest and await hand off to SLP.   Session Two: Pt seen for OT session focusing  on forced use of R UE as well as cognitive remediation. Pt received sitting on toilet with hand off from NT. Pt completed toileting task with supervision, then ambulating out of bathroom with RW and supervision to complete hand hygneie standing at sink. Pt ambulated from to therapy gym with supervision, demonstrating much improved functional activity tolerance.  In gym, completed table top activity of moderately complex puzzle, required to only use R UE to manipulate pieces. Pt  Demonstrated good problem solving tactics to determine where pieces went, however, still required mod cuing for problem solving. Pt reutnred to room at end of session in same manner as described above, Pt left in supine at end of session with bed alarm on and all needs in reach.    Therapy Documentation Precautions:  Precautions Precautions: Fall Restrictions Weight Bearing Restrictions: No Pain:  no/denies pain  See Function Navigator for Current Functional Status.   Therapy/Group: Individual Therapy  Kimyatta Lecy L 05/18/2018, 6:36 AM

## 2018-05-18 NOTE — Progress Notes (Signed)
Physical Therapy Session Note  Patient Details  Name: Tiffany Thornton MRN: [id number] Date of Birth: 29-Jul-1933  Today's Date: 05/18/2018 PT Individual Time: 1115-1200 PT Individual Time Calculation (min): 45 min   Short Term Goals: Week 2:  PT Short Term Goal 2 (Week 2): STG = LTG due to estimated ELOS.  Skilled Therapeutic Interventions/Progress Updates:    Pt received supine in bed, agreeable to PT. No complaints of pain. Bed mobility SBA with use of bedrails. SPT bed to w/c with min A. Ambulation x 180 ft with RW and SBA, decreased coordination of RLE with gait. Trial ambulation with rail in hallway and min A x 100 ft. Trial ambulation with no AD and min A 2 x 100 ft. Pt exhibits increased ataxia and decreased coordination of RLE with gait with no AD. Side-steps with BUE support and CGA 2 x 50 ft L/R, increase in dizziness which subsides with seated rest break. Ascend/descend 12 stairs with 2 handrails and CGA for balance, v/c for step-to gait pattern. Manual w/c propulsion 2 x 150 ft with BUE with SBA. Pt reports her head feeling "full" today due to wax buildup in R ear, has received ear drops from RN. Pt shows poor insight into visual deficits following CVA and believes her balance and coordination will improve once her ear clears up. Educated pt on visual deficits and to continue with her visual exercises. Pt left seated in w/c in room with chair alarm in place, set up for lunch, sister present.  Therapy Documentation Precautions:  Precautions Precautions: Fall Restrictions Weight Bearing Restrictions: No  See Function Navigator for Current Functional Status.  Therapy/Group: Individual Therapy  Peter Congoaylor Esbeidy Mclaine, PT, DPT  05/18/2018, 12:03 PM

## 2018-05-18 NOTE — Progress Notes (Signed)
Speech Language Pathology Daily Session Note  Patient Details  Name: Tiffany Thornton MRN: [id number] Date of Birth: 01/02/1933  Today's Date: 05/18/2018 SLP Individual Time: [id number] SLP Individual Time Calculation (min): 53 min  Short Term Goals: Week 2: SLP Short Term Goal 1 (Week 2): Pt will demonstrate selective attention in mildly distractioning enviorment to functional task for 45 minutes with supervision A verbal cues for redirection. SLP Short Term Goal 2 (Week 2): Pt will demonstrate 2 safety precuations during functional tasks with supervision A verbal cues.  SLP Short Term Goal 3 (Week 2): Pt will utilize external memory aid to recall new, daily information with Supervision A verbal cues.  SLP Short Term Goal 4 (Week 2): Pt will utilize speech intelligibility strategies at the sentence level with supervision A verbal cues to achieve 80% intelligibility.  SLP Short Term Goal 5 (Week 2): Pt will complete semi-complex functional problem solving tasks with supervision A question and verbal cues. SLP Short Term Goal 6 (Week 2): Pt will demonstrate self-monitoring and self-correction in functional problem solving tasks with supervision A verbal and question cues.  Skilled Therapeutic Interventions: Skilled treatment session focused on cognition goals. SLP facilitated session by providing Max A cues to demonstrate anticipatory awareness within possible activities within home (doing laundry). Pt able to understand what SLP explains about deficits but disagrees with SLP that it would be unsafe to perform activities unsupervised in home ("she sates "I will find a way"). Extensive education provided to pt on high fall risk and need for someone to be beside her during tasks. Pt required Mod A for sustained attention d/t internal distractions of ear pain (MD aware and treating). Pt with vertigo during transfer and vomiting, nursing aware. Pt was left with nursing in bed, bed alarm on and all needs within  reach. Pt is at high risk for falling d/t lack of anticipatory awareness/safety awareness and fierce level of independence.      Function:    Cognition Comprehension Comprehension assist level: Follows basic conversation/direction with no assist  Expression   Expression assist level: Expresses basic needs/ideas: With no assist  Social Interaction Social Interaction assist level: Interacts appropriately with others with medication or extra time (anti-anxiety, antidepressant).  Problem Solving Problem solving assist level: Solves basic problems with no assist  Memory Memory assist level: More than reasonable amount of time    Pain    Therapy/Group: Individual Therapy  Tiea Manninen 05/18/2018, 9:24 AM

## 2018-05-19 ENCOUNTER — Encounter (HOSPITAL_COMMUNITY): Payer: Medicare Other | Admitting: Speech Pathology

## 2018-05-19 ENCOUNTER — Ambulatory Visit (HOSPITAL_COMMUNITY): Payer: Medicare Other | Admitting: Physical Therapy

## 2018-05-19 ENCOUNTER — Encounter (HOSPITAL_COMMUNITY): Payer: Medicare Other

## 2018-05-19 NOTE — Progress Notes (Signed)
Subjective/Complaints:  No issues overnite, RIght ear feels a little better, now getting debrox  ROS- neg CP, SOB, +nausea, no vomiting no abd pain  Objective: Vital Signs: Blood pressure (!) 135/54, pulse 75, temperature 98 F (36.7 C), temperature source Oral, resp. rate 18, height 5' 7"  (1.702 m), weight 62.3 kg (137 lb 5.6 oz), SpO2 95 %. No results found. Results for orders placed or performed during the hospital encounter of 05/07/18 (from the past 72 hour(s))  Glucose, capillary     Status: Abnormal   Collection Time: 05/16/18 12:02 PM  Result Value Ref Range   Glucose-Capillary 112 (H) 65 - 99 mg/dL  Glucose, capillary     Status: Abnormal   Collection Time: 05/16/18  4:33 PM  Result Value Ref Range   Glucose-Capillary 111 (H) 65 - 99 mg/dL  Glucose, capillary     Status: Abnormal   Collection Time: 05/16/18  9:14 PM  Result Value Ref Range   Glucose-Capillary 128 (H) 65 - 99 mg/dL   Comment 1 Notify RN   Basic metabolic panel     Status: Abnormal   Collection Time: 05/17/18  5:36 AM  Result Value Ref Range   Sodium 136 135 - 145 mmol/L   Potassium 4.4 3.5 - 5.1 mmol/L   Chloride 102 101 - 111 mmol/L   CO2 27 22 - 32 mmol/L   Glucose, Bld 106 (H) 65 - 99 mg/dL   BUN 26 (H) 6 - 20 mg/dL   Creatinine, Ser 1.18 (H) 0.44 - 1.00 mg/dL   Calcium 9.3 8.9 - 10.3 mg/dL   GFR calc non Af Amer 41 (L) >60 mL/min   GFR calc Af Amer 48 (L) >60 mL/min    Comment: (NOTE) The eGFR has been calculated using the CKD EPI equation. This calculation has not been validated in all clinical situations. eGFR's persistently <60 mL/min signify possible Chronic Kidney Disease.    Anion gap 7 5 - 15    Comment: Performed at [facility] [address], [place], [place] [address]  Glucose, capillary     Status: Abnormal   Collection Time: 05/17/18  6:27 AM  Result Value Ref Range   Glucose-Capillary 105 (H) 65 - 99 mg/dL  Glucose, capillary     Status: Abnormal   Collection Time:  05/17/18 11:54 AM  Result Value Ref Range   Glucose-Capillary 102 (H) 65 - 99 mg/dL  Glucose, capillary     Status: Abnormal   Collection Time: 05/17/18  9:36 PM  Result Value Ref Range   Glucose-Capillary 110 (H) 65 - 99 mg/dL     HEENT: normal Cardio: RRR and no murmur Resp: CTA B/L and Unlabored GI: BS positive and NT, ND Extremity:  No Edema Skin:   Bruise diffuse ecchymosis in BUE and BLE Neuro: Alert/Oriented, Anxious, Normal Sensory, Normal Motor, Abnormal FMC Ataxic/ dec FMC and  Moderate limb ataxia with FNF and HS on RIght side, no dysmetria on Left side Musc/Skel:  Other no pain with UE or LE ROM Gen NAD   Assessment/Plan: 1. 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 Physiatrist is providing close team supervision and 24 hour management of active medical problems listed below. Physiatrist and rehab team continue to assess barriers to discharge/monitor patient progress toward functional and medical goals. FIM: Function - Bathing Bathing activity did not occur: Refused Position: Research scientist (life sciences) parts  bathed by patient: Right arm, Left arm, Chest, Abdomen, Front perineal area, Buttocks, Right upper leg, Left upper leg, Right lower leg, Left lower leg, Back Body parts bathed by helper: Back Assist Level: Supervision or verbal cues  Function- Upper Body Dressing/Undressing Upper body dressing/undressing activity did not occur: Refused What is the patient wearing?: Bra, Pull over shirt/dress Bra - Perfomed by patient: Thread/unthread right bra strap, Thread/unthread left bra strap, Hook/unhook bra (pull down sports bra) Pull over shirt/dress - Perfomed by patient: Thread/unthread right sleeve, Thread/unthread left sleeve, Put head through opening, Pull shirt over trunk Assist Level: Set up Set up : To obtain clothing/put  away Function - Lower Body Dressing/Undressing Lower body dressing/undressing activity did not occur: Refused What is the patient wearing?: Underwear, Pants, Shoes Position: Wheelchair/chair at sink Underwear - Performed by patient: Thread/unthread right underwear leg, Pull underwear up/down, Thread/unthread left underwear leg Underwear - Performed by helper: Thread/unthread left underwear leg Pants- Performed by patient: Thread/unthread right pants leg, Pull pants up/down, Thread/unthread left pants leg Pants- Performed by helper: Thread/unthread left pants leg Shoes - Performed by patient: Don/doff right shoe, Don/doff left shoe(elastic shoelaces) Shoes - Performed by helper: Don/doff right shoe, Fasten right TED Hose - Performed by helper: Don/doff right TED hose, Don/doff left TED hose Assist for footwear: Supervision/touching assist Assist for lower body dressing: Supervision or verbal cues  Function - Toileting Toileting steps completed by patient: Adjust clothing prior to toileting, Adjust clothing after toileting, Performs perineal hygiene Toileting steps completed by helper: Performs perineal hygiene, Adjust clothing after toileting(per NT report) Toileting Assistive Devices: Grab bar or rail Assist level: Supervision or verbal cues  Function - Air cabin crew transfer assistive device: Grab bar, Walker, Elevated toilet seat/BSC over toilet Assist level to toilet: Supervision or verbal cues Assist level from toilet: Supervision or verbal cues  Function - Chair/bed transfer Chair/bed transfer method: Stand pivot Chair/bed transfer assist level: Touching or steadying assistance (Pt > 75%) Chair/bed transfer assistive device: Armrests Chair/bed transfer details: Verbal cues for technique  Function - Locomotion: Wheelchair Will patient use wheelchair at discharge?: Yes Type: Manual Max wheelchair distance: 150' Assist Level: Supervision or verbal cues Assist Level:  Supervision or verbal cues Assist Level: Supervision or verbal cues Turns around,maneuvers to table,bed, and toilet,negotiates 3% grade,maneuvers on rugs and over doorsills: No Function - Locomotion: Ambulation Assistive device: Walker-rolling Max distance: 180' Assist level: Supervision or verbal cues Assist level: Supervision or verbal cues Walk 50 feet with 2 turns activity did not occur: Safety/medical concerns Assist level: Supervision or verbal cues Walk 150 feet activity did not occur: Safety/medical concerns Assist level: Supervision or verbal cues Walk 10 feet on uneven surfaces activity did not occur: Safety/medical concerns  Function - Comprehension Comprehension: Auditory Comprehension assist level: Understands basic 75 - 89% of the time/ requires cueing 10 - 24% of the time  Function - Expression Expression: Verbal Expression assist level: Expresses basic 90% of the time/requires cueing < 10% of the time.  Function - Social Interaction Social Interaction assist level: Interacts appropriately with others - No medications needed.  Function - Problem Solving Problem solving assist level: Solves complex 90% of the time/cues < 10% of the time, Solves complex problems: With extra time  Function - Memory Memory assist level: More than reasonable amount of time Patient normally able to recall (first 3 days only): Current season  Medical Problem List and Plan: 1.  Deficits with mobility, ataxia secondary to right cerebellar hemorrhage and infarct s/p  evacuation.CT unchanged no new bleeding CIR PT, OT, SLP,2.  DVT Prophylaxis/Anticoagulation: subq Lovenox, may continue 3. Pain Management: hydrocodone prn for pain. Discontinue dilaudid.  4. Mood: LCSW to follow for evaluation and support.  5. Neuropsych: This patient is not fully capable of making decisions on her own behalf. 6. Skin/Wound Care: Monitor wound for healing. Maintain adequate nutritional and hydration status.   7. Fluids/Electrolytes/Nutrition: Monitor I/O. Check lytes in am.I 847m 6/19  8. HTN: Monitor BP bid and continue to titrate medications for tighter control as still labile. Will add catapres for prn use.  Apresoline 579mTID, lisinopril 2041mID Vitals:   05/18/18 2110 05/19/18 0604  BP: (!) 151/57 (!) 135/54  Pulse: 77 75  Resp: 18 18  Temp: 97.8 F (36.6 C) 98 F (36.7 C)  SpO2: 95% 95%  Controlled 6/22     9. Low calorie malnutrition:  Check magnesium/phosphorus levels in am. Add protein supplements.  10. Pre diabetes: Hgb A1C- 5.8  11. Hypokalemia: Supplement today with Kdur and Mag Ox. Improved 6/13 12. Dizziness:Central vestibular dysfunction overall improving ,head movements still can provoke symptoms  LOS (Days) 12 A FACE TO FACE EVALUATION WAS PERFORMED  AndCharlett Blake22/2019, 9:56 AM

## 2018-05-19 NOTE — Progress Notes (Signed)
Occupational Therapy Session Note  Patient Details  Name: Tiffany Thornton MRN: [id number] Date of Birth: July 03, 1933  Today's Date: 05/19/2018 OT Individual Time: 1445-1525 OT Individual Time Calculation (min): 40 min    Short Term Goals: Week 2:  OT Short Term Goal 1 (Week 2): STG=LTG due to LOS Week 3:     Skilled Therapeutic Interventions/Progress Updates:    1;1. Pt reporting feeling "bad" but willing to participate in family ed with chidren. Reviewed need for close supervision d/t vistibular/balance impairments. Pt completes ambualtion to ADL apartment with OT demo close supervision while walking with RW, cueing for RW management as well as safety awareness suring functional mobility and turns. Pt unable to demo safe shower stall transfer to chair as pt attempts to lift walker into/over shower ledge. Reviewed posterior entrance method and each of the family members able to successfully demonstrate cuing/safety awareness during transfers/close supervision. Reviewed energy conservation strategies as well as vestibular exercises of gaze stabilization and saccades with pt and family. Pt returned to room seated in bed with call light in reach and all needs met and family in room to supervise.   Therapy Documentation Precautions:  Precautions Precautions: Fall Restrictions Weight Bearing Restrictions: No  See Function Navigator for Current Functional Status.   Therapy/Group: Individual Therapy  Tonny Branch 05/19/2018, 3:26 PM

## 2018-05-19 NOTE — Progress Notes (Signed)
Physical Therapy Session Note  Patient Details  Name: Tiffany Thornton MRN: [id number] Date of Birth: Nov 16, 1933  Today's Date: 05/19/2018 PT Individual Time: 1300-1330 PT Individual Time Calculation (min): 30 min  and Today's Date: 05/19/2018 PT Missed Time: 30 Minutes Missed Time Reason: Pain  Short Term Goals: Week 2:  PT Short Term Goal 2 (Week 2): STG = LTG due to estimated ELOS.  Skilled Therapeutic Interventions/Progress Updates:   Pt toileting and agreeable to therapy, reports stomach pain and states her stomach has been feeling bad all day. Did not rate and could not describe pain when asked, but grasping stomach and grimacing throughout session. Pt's children present for family education this session. Provided set-up assist to don clothes and shoes while seated at EOB, using RW for support in standing. Educated children on providing supervision level assist for gait, transfers, and threshold negotiation w/ RW as per home set-up. All verbalized and demonstrated understanding while asking appropriate questions regarding d/c recommendations. Discussed needing someone close by her for all mobility at first, especially w/ intermittent bouts of dizziness and vertigo for safety. Pt states "I will figure it out" and "I don't need any therapy". Pt able to orient to deficits w/ verbal cues and encouraged to participate in at-home therapy so she can work towards doing things for herself again. Her children are on board with this and encouraging her as well. Ended session in w/c and in care of family, all needs met. Missed 30 min of skilled PT 2/2 stomach pain.   Therapy Documentation Precautions:  Precautions Precautions: Fall Restrictions Weight Bearing Restrictions: No General: PT Amount of Missed Time (min): 30 Minutes PT Missed Treatment Reason: Pain  See Function Navigator for Current Functional Status.   Therapy/Group: Individual Therapy  Karin Griffith K Arnette 05/19/2018, 1:42 PM

## 2018-05-19 NOTE — Progress Notes (Signed)
Speech Language Pathology Daily Session Note  Patient Details  Name: Tiffany Thornton MRN: [id number] Date of Birth: 1933-09-15  Today's Date: 05/19/2018 SLP Individual Time: 1400-1430 SLP Individual Time Calculation (min): 30 min  Short Term Goals: Week 2: SLP Short Term Goal 1 (Week 2): Pt will demonstrate selective attention in mildly distractioning enviorment to functional task for 45 minutes with supervision A verbal cues for redirection. SLP Short Term Goal 2 (Week 2): Pt will demonstrate 2 safety precuations during functional tasks with supervision A verbal cues.  SLP Short Term Goal 3 (Week 2): Pt will utilize external memory aid to recall new, daily information with Supervision A verbal cues.  SLP Short Term Goal 4 (Week 2): Pt will utilize speech intelligibility strategies at the sentence level with supervision A verbal cues to achieve 80% intelligibility.  SLP Short Term Goal 5 (Week 2): Pt will complete semi-complex functional problem solving tasks with supervision A question and verbal cues. SLP Short Term Goal 6 (Week 2): Pt will demonstrate self-monitoring and self-correction in functional problem solving tasks with supervision A verbal and question cues.  Skilled Therapeutic Interventions: Skilled treatment session focused on education with pt's 3 children. Information provided on attention deficits, awareness deficits and fierce independent personality. Pt was severely dizzy and very uncomfortable in bed, so her deficits were more apparent to pt's family. However, SLP provided examples of frequent ADLs within home environment that would be very difficult and extensive education provide on need for close 24 hour supervision. All questions were answered and all members voiced understanding of high fall risk and need for close supervision. Pt was left upright in bed, bed alarm on and all needs within reach, family present.      Function:   Cognition Comprehension Comprehension assist  level: Follows basic conversation/direction with extra time/assistive device;Follows basic conversation/direction with no assist  Expression   Expression assist level: Expresses complex ideas: With extra time/assistive device  Social Interaction Social Interaction assist level: Interacts appropriately with others - No medications needed.;Interacts appropriately with others with medication or extra time (anti-anxiety, antidepressant).  Problem Solving Problem solving assist level: Solves complex 90% of the time/cues < 10% of the time;Solves complex problems: With extra time  Memory Memory assist level: More than reasonable amount of time    Pain    Therapy/Group: Individual Therapy  Tiffany Thornton 05/19/2018, 2:46 PM

## 2018-05-21 ENCOUNTER — Inpatient Hospital Stay (HOSPITAL_COMMUNITY): Payer: Medicare Other | Admitting: Physical Therapy

## 2018-05-21 ENCOUNTER — Inpatient Hospital Stay (HOSPITAL_COMMUNITY): Payer: Medicare Other | Admitting: Occupational Therapy

## 2018-05-21 ENCOUNTER — Inpatient Hospital Stay (HOSPITAL_COMMUNITY): Payer: Medicare Other | Admitting: Speech Pathology

## 2018-05-21 DIAGNOSIS — R0989 Other specified symptoms and signs involving the circulatory and respiratory systems: Secondary | ICD-10-CM

## 2018-05-21 MED ORDER — MECLIZINE HCL 25 MG PO TABS
12.5000 mg | ORAL_TABLET | Freq: Three times a day (TID) | ORAL | Status: DC
  Administered 2018-05-21 – 2018-05-22 (×4): 12.5 mg via ORAL
  Filled 2018-05-21 (×4): qty 1

## 2018-05-21 MED ORDER — HYDRALAZINE HCL 25 MG PO TABS
25.0000 mg | ORAL_TABLET | Freq: Three times a day (TID) | ORAL | Status: DC
  Administered 2018-05-21 – 2018-05-22 (×4): 25 mg via ORAL
  Filled 2018-05-21 (×4): qty 1

## 2018-05-21 MED ORDER — METOPROLOL TARTRATE 25 MG PO TABS
25.0000 mg | ORAL_TABLET | Freq: Two times a day (BID) | ORAL | Status: DC
  Administered 2018-05-21 – 2018-05-22 (×2): 25 mg via ORAL
  Filled 2018-05-21 (×2): qty 1

## 2018-05-21 MED ORDER — ACETAMINOPHEN 325 MG PO TABS: 325.0000 mg | ORAL_TABLET | ORAL | Status: DC | PRN

## 2018-05-21 NOTE — Progress Notes (Signed)
As this nurse was about to give patient her am synthroid, she started vomiting mucous. Son is present & was talking to the patient when she suddenly started. No c/o nausea during shift. Blood pressure was 104/46, hydralazine was held at this time. On coming nurse notified. Patient stated that she felt a little better & asked for a cup of coffee. After the coffee was given, she took her synthroid with no difficulty. No acute distress noted

## 2018-05-21 NOTE — Progress Notes (Signed)
Subjective/Complaints:  Having ongoing nausea at times. Reports severe headache yesterday. Had an episode of N/V early this morning. Son at bedside  ROS: Patient denies fever, rash, sore throat, blurred vision, nausea, vomiting, diarrhea, cough, shortness of breath or chest pain, joint or back pain, headache, or mood change.    Objective: Vital Signs: Blood pressure (!) 148/48, pulse 72, temperature 98.2 F (36.8 C), temperature source Oral, resp. rate 18, height 5\' 7"  (1.702 m), weight 62.3 kg (137 lb 5.6 oz), SpO2 97 %. No results found. No results found for this or any previous visit (from the past 72 hour(s)).   Constitutional: No distress . Vital signs reviewed. HEENT: EOMI, oral membranes moist Neck: supple Cardiovascular: RRR without murmur. No JVD    Respiratory: CTA Bilaterally without wheezes or rales. Normal effort    GI: BS +, non-tender, non-distended  Skin:   Bruise diffuse ecchymosis in BUE and BLE Neuro: Alert/Oriented,   Normal Sensory, Normal Motor, Abnormal FMC Ataxic/ bilateral, right more than left.  Musc/Skel:  Other no pain with UE or LE ROM Psych: anxious.    Assessment/Plan: 1. 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 Physiatrist is providing close team supervision and 24 hour management of active medical problems listed below. Physiatrist and rehab team continue to assess barriers to discharge/monitor patient progress toward functional and medical goals. FIM: Function - Bathing Bathing activity did not occur: Refused Position: Shower Body parts bathed by patient: Right arm, Left arm, Chest, Abdomen, Front perineal area, Buttocks, Right upper leg, Left upper leg, Right lower leg, Left lower leg, Back Body parts bathed by helper: Back Assist Level: Supervision or verbal cues  Function- Upper Body  Dressing/Undressing Upper body dressing/undressing activity did not occur: Refused What is the patient wearing?: Bra, Pull over shirt/dress Bra - Perfomed by patient: Thread/unthread right bra strap, Thread/unthread left bra strap, Hook/unhook bra (pull down sports bra) Pull over shirt/dress - Perfomed by patient: Thread/unthread right sleeve, Thread/unthread left sleeve, Put head through opening, Pull shirt over trunk Assist Level: Set up Set up : To obtain clothing/put away Function - Lower Body Dressing/Undressing Lower body dressing/undressing activity did not occur: Refused What is the patient wearing?: Underwear, Pants, Shoes Position: Wheelchair/chair at sink Underwear - Performed by patient: Thread/unthread right underwear leg, Pull underwear up/down, Thread/unthread left underwear leg Underwear - Performed by helper: Thread/unthread left underwear leg Pants- Performed by patient: Thread/unthread right pants leg, Pull pants up/down, Thread/unthread left pants leg Pants- Performed by helper: Thread/unthread left pants leg Shoes - Performed by patient: Don/doff right shoe, Don/doff left shoe(elastic shoelaces) Shoes - Performed by helper: Don/doff right shoe, Fasten right TED Hose - Performed by helper: Don/doff right TED hose, Don/doff left TED hose Assist for footwear: Supervision/touching assist Assist for lower body dressing: Supervision or verbal cues  Function - Toileting Toileting steps completed by patient: Adjust clothing prior to toileting, Adjust clothing after toileting, Performs perineal hygiene Toileting steps completed by helper: Performs perineal hygiene, Adjust clothing after toileting(per NT report) Chief Executive OfficerToileting Assistive Devices: Grab bar or rail Assist level: Supervision or verbal cues  Function - ArchivistToilet Transfers Toilet transfer assistive device: Grab bar, Walker, Elevated toilet seat/BSC over toilet Assist level to toilet: Supervision or verbal cues Assist level  from toilet: Supervision or verbal cues  Function - Chair/bed transfer Chair/bed transfer method: Squat pivot Chair/bed transfer assist level: Supervision or verbal cues  Chair/bed transfer assistive device: Walker, Armrests Chair/bed transfer details: Verbal cues for technique  Function - Locomotion: Wheelchair Will patient use wheelchair at discharge?: Yes Type: Manual Max wheelchair distance: 150' Assist Level: Supervision or verbal cues Assist Level: Supervision or verbal cues Assist Level: Supervision or verbal cues Turns around,maneuvers to table,bed, and toilet,negotiates 3% grade,maneuvers on rugs and over doorsills: No Function - Locomotion: Ambulation Assistive device: Walker-rolling Max distance: 150' Assist level: Supervision or verbal cues Assist level: Supervision or verbal cues Walk 50 feet with 2 turns activity did not occur: Safety/medical concerns Assist level: Supervision or verbal cues Walk 150 feet activity did not occur: Safety/medical concerns Assist level: Supervision or verbal cues Walk 10 feet on uneven surfaces activity did not occur: Safety/medical concerns  Function - Comprehension Comprehension: Auditory Comprehension assist level: Follows basic conversation/direction with no assist  Function - Expression Expression: Verbal Expression assist level: Expresses complex ideas: With extra time/assistive device  Function - Social Interaction Social Interaction assist level: Interacts appropriately 75 - 89% of the time - Needs redirection for appropriate language or to initiate interaction.  Function - Problem Solving Problem solving assist level: Solves basic problems with no assist  Function - Memory Memory assist level: More than reasonable amount of time Patient normally able to recall (first 3 days only): Current season, That he or she is in a hospital, Staff names and faces  Medical Problem List and Plan: 1.  Deficits with mobility, ataxia  secondary to right cerebellar hemorrhage and infarct s/p evacuation. HCT 6/19 reviewed and unchanged no new bleeding  -had discussion with pt/son regarding symptoms regarding bleed especially in this area. There are no signs of worsening bleed/stroke but she will have to deal with sequelae of hemorrhage in this area  - continue CIR PT, OT, SLP  -dc tomorrow 2.  DVT Prophylaxis/Anticoagulation: subq Lovenox, may continue 3. Pain Management: hydrocodone prn for pain.   -encouraged use of as needed  4. Mood: LCSW to follow for evaluation and support.  5. Neuropsych: This patient is not fully capable of making decisions on her own behalf. 6. Skin/Wound Care: Monitor wound for healing. Maintain adequate nutritional and hydration status.  7. Fluids/Electrolytes/Nutrition: eating well despite intermittent n/v  -reinforced importance of regular PO intake   8. HTN: catapres for prn use.   Apresoline 50mg  TID, lisinopril 20mg  BID Vitals:   05/21/18 0449 05/21/18 0800  BP: (!) 104/46 (!) 148/48  Pulse: 72   Resp: 18   Temp: 98.2 F (36.8 C)   SpO2: 97%   has had some lability but suspect the increase was related to her headache. Has been fairly well controlled over the last several days.  Will reduce hydralazine and increase lopressor to 25mg  bid (hopefully HR can tolerate)---this may give a little more consistency to BP control.  9. Low calorie malnutrition:  improved intake 10. Pre diabetes: Hgb A1C- 5.8  11. Hypokalemia: Supplement today with Kdur and Mag Ox. Improved 6/13 12. Dizziness: Central vestibular dysfunction overall improving ,head movements still can provoke symptoms as we discussed  LOS (Days) 14 A FACE TO FACE EVALUATION WAS PERFORMED  Ranelle Oyster 05/21/2018, 9:16 AM

## 2018-05-21 NOTE — Progress Notes (Signed)
Physical Therapy Session Note  Patient Details  Name: Tiffany Thornton MRN: [id number] Date of Birth: Mar 28, 1933  Today's Date: 05/21/2018 PT Individual Time: 1005-1024 PT Individual Time Calculation (min): 19 min   Short Term Goals: Week 2:  PT Short Term Goal 2 (Week 2): STG = LTG due to estimated ELOS.  Skilled Therapeutic Interventions/Progress Updates:  Pt received in bed & agreeable to tx. Pt reporting feeling unwell since 4 o'clock yesterday but pt very eager to go home tomorrow. BP assessed = 154/62 mmHg (LUE, lying), HR = 65 bpm. Pt transitioned to EOB with supervision, HOB elevated & bed rails. Pt tolerated sitting EOB for a few minutes, reporting dizziness with education to focus on a stationary object but no relief reported. Pt with ongoing nausea and beginning to vomit but no emesis produced. Pt returned to bed and left with all needs within reach. Pt reports ongoing headache "from my neck up" (5-6/10 pain) but reports being premedicated. Pt missed 56 minutes 2/2 nausea & vomiting, will f/u per POC. Notified LCSW ([name]) of pt's inability to participate in therapy today, which was supposed to be "grad day".   Therapy Documentation Precautions:  Precautions Precautions: Fall Restrictions Weight Bearing Restrictions: No   General: PT Amount of Missed Time (min): 56 Minutes PT Missed Treatment Reason: Patient ill (Comment)(N/V)   See Function Navigator for Current Functional Status.   Therapy/Group: Individual Therapy  Sandi MariscalVictoria M Miller 05/21/2018, 10:37 AM

## 2018-05-21 NOTE — Progress Notes (Signed)
Patient c/o headache since some time on last shift. Was given tramadol then. In this shift so far, she c/o pounding headache, pain scale 8/10, no other symptoms except BP 170/74. Was given tylenol & her scheduled BP meds. BP recheck was 126/58 & she reported less pain. No N/V, dizziness or respiratory symptoms. Ataxia seemed to be the same as previously noted 2 weeks ago. Cat scan was repeated on 6/19. No acute distress noted. Will continue to monitor.

## 2018-05-21 NOTE — Progress Notes (Signed)
Occupational Therapy Session Note  Patient Details  Name: Tiffany Thornton MRN: [id number] Date of Birth: 1933/03/11  Today's Date: 05/21/2018 OT Individual Time: [id number] and [id number] OT Individual Time Calculation (min): 32 min and 11 min OT Missed time total today: 47 min (fatigue/ nausea)   Short Term Goals: Week 2:  OT Short Term Goal 1 (Week 2): STG=LTG due to LOS  Skilled Therapeutic Interventions/Progress Updates:    Session One: Pt seen for OT session focusing on upright tolerance, ADL re-training and fine motor coordination during set-up of meal. Pt in supine upon arrival, awake with complaints of rough night and some nausea. Reports haivng not eaten anything since lunch yesterday. Her son arrived bringing her a bagel and pt willing to attempt eating it. She sat EOB and able to smear spread on bagel and set-up. PT able to get several bites in, however, began to vomit small amounts. RN made aware. BP assessed 148/48. Pt tolerated sitting EOB ~30 minutes before stating she was unable to cont and requesting return to supine to rest. Voiced "not feeling right", however, unable to elaborate on symptoms.  Pt returend to rest, left with bed alarm on, needs in reach and son present. Extensive education provided throughout session regarding vestibular related vs BP related dizziness/lightedheness, energy conservation, task prioritization, safety awareness, and d/c planning. Pt's son reports family ed session went well this weekend, however, concerns regarding tomorrrow's d/c as pt has not been feeling well for several days now. RN and PA aware of concerns.   Session Two: Pt still in supine upon arrival with RN present administering medications. Pt cont with complaints of nausea and generalized weakness. Pt very motivated to participate in therapy, however, when discussed coming to sit EOB, she verbalized being too fatigued to attempt. Extensive education provided regarding energy conservation,  POC, current symptoms, CVA recovery and rate of return and d/c planning. Pt very motivated to return home, however, reviewed need for pt to be medically stable before transferring home. She was unable to get OOB today. Pt left in supine to rest, will attempt therapies tomorrow as pt able.   Therapy Documentation Precautions:  Precautions Precautions: Fall Restrictions Weight Bearing Restrictions: No  See Function Navigator for Current Functional Status.   Therapy/Group: Individual Therapy  Tiffany Thornton L 05/21/2018, 7:10 AM

## 2018-05-21 NOTE — Discharge Summary (Signed)
Physician Discharge Summary  Patient ID: Tiffany Thornton MRN: [id number] DOB/AGE: 08/01/1933 84 y.o.  Admit date: 05/07/2018 Discharge date: 05/21/2018  Discharge Diagnoses:  Principal Problem:   Cerebellar hemorrhage, nontraumatic (HCC) Active Problems:   Ataxia, post-stroke   Essential hypertension   Prediabetes   Dizziness and giddiness   Discharged Condition: stable   Significant Diagnostic Studies: Ct Head Wo Contrast  Result Date: 05/16/2018 CLINICAL DATA:  Intracranial hemorrhage, known, follow-up EXAM: CT HEAD WITHOUT CONTRAST TECHNIQUE: Contiguous axial images were obtained from the base of the skull through the vertex without intravenous contrast. COMPARISON:  04/30/2018 head CT.  05/05/2018 brain MRI. FINDINGS: Brain: Primarily low-density seen in place of the previously noted right cerebellar hematoma. There was surrounding infarct on interval MRI. No residual mass effect or high-density blood products. Chronic small vessel ischemic type change in the cerebral white matter. No evidence of interval infarct. No hydrocephalus or collection. Vascular: Atherosclerotic calcification. Skull: Suboccipital craniectomy. Mild CSF density in the suboccipital region, without regional mass effect. Sinuses/Orbits: Negative IMPRESSION: 1. Recent right cerebellar hemorrhage and evacuation. No recurrent hemorrhage or mass effect. No hydrocephalus. 2. Stable suboccipital fluid collection. Electronically Signed   By: Tiffany Spring M.D.   On: 05/16/2018 14:14   Ct Head Wo Contrast  Result Date: 05/03/2018 CLINICAL DATA:  Intracranial hemorrhage follow up EXAM: CT HEAD WITHOUT CONTRAST TECHNIQUE: Contiguous axial images were obtained from the base of the skull through the vertex without intravenous contrast. COMPARISON:  Head CT 05/01/2018, 04/30/2018 FINDINGS: Brain: Status post surgical evacuation of right posterior fossa hematoma. The amount of pneumocephalus has decreased. The fourth ventricle and  basal cisterns are patent. No new hemorrhage. No hydrocephalus. The area of hypoattenuation in the right cerebellum has slightly increased in size. There is white matter hypoattenuation of the supratentorial brain consistent with chronic small vessel disease. Old right basal ganglia lacunar infarct is unchanged. Vascular: No abnormal hyperdensity of the major intracranial arteries or dural venous sinuses. No intracranial atherosclerosis. Skull: Status post suboccipital craniectomy. Sinuses/Orbits: No fluid levels or advanced mucosal thickening of the visualized paranasal sinuses. No mastoid or middle ear effusion. The orbits are normal. IMPRESSION: Decreased pneumocephalus and low products in the right posterior fossa status post surgical evacuation of hematoma. No new abnormality. Electronically Signed   By: Tiffany Robinson M.D.   On: 05/03/2018 04:47   Mr Tiffany Thornton Head Wo Contrast  Result Date: 05/05/2018 CLINICAL DATA:  Known intracranial hemorrhage. EXAM: MRI HEAD WITHOUT AND WITH CONTRAST MRA HEAD WITHOUT CONTRAST TECHNIQUE: Multiplanar, multiecho pulse sequences of the brain and surrounding structures were obtained without and with intravenous contrast. Angiographic images of the head were obtained using MRA technique without contrast. CONTRAST:  11mL MULTIHANCE GADOBENATE DIMEGLUMINE 529 MG/ML IV SOLN COMPARISON:  CT head without contrast 05/03/2018, 05/01/2018, and 04/30/2018. FINDINGS: MRI HEAD FINDINGS Brain: Right occipital craniotomy for debulking of hemorrhage in the right cerebellum is again noted. Residual blood products are present. More lateral infarction is noted. There is edema surrounding the hemorrhage and infarct. No new hemorrhage is present. Mild residual mass effect on the fourth ventricle is again noted without obstruction or hydrocephalus. Moderate periventricular white matter changes are present bilaterally. No significant supratentorial hemorrhage is present. Postcontrast images demonstrate  no pathologic enhancement. There is no mass lesion associated with the hemorrhage or adjacent infarct. Ventricles are proportionate to the degree of atrophy. No significant extra-axial fluid collection is present. Vascular: Flow is present in the major intracranial arteries. Skull and upper cervical spine:  The skull base is otherwise within normal limits. Craniocervical junction is normal. Sinuses/Orbits: The paranasal sinuses are clear. There is some fluid in the mastoid air cells bilaterally. No obstructing nasopharyngeal lesion is present. MRA HEAD FINDINGS The time-of-flight images demonstrate no focal vascular lesion associated with the area of hemorrhage. 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 An additional superior left paraophthalmic artery aneurysm measures 3 mm. A 2 mm aneurysm versus infundibulum is present at the right posterior communicating artery. The left A1 and M1 segments are normal. The right A1 segment is hypoplastic. A 4 mm anterior communicating artery aneurysm is present. The MCA bifurcations are within normal limits bilaterally. There is some attenuation of distal MCA branch vessels without a significant proximal stenosis, occlusion, or aneurysm. There is some attenuation of distal ACA branch vessels as well. The vertebral arteries are codominant. The PICA origins are visualized and normal. The basilar artery is normal. Both posterior cerebral arteries originate from basilar tip. PCA branch vessels are within normal limits proximally. IMPRESSION: 1. Stable appearance of residual right cerebellar hemorrhage status post suboccipital craniotomy. 2. Acute/subacute nonhemorrhagic infarct lateral to the cerebellar hemorrhage. 3. There is edema surrounding the area of infarct and hemorrhage with decreasing mass effect on the fourth ventricle and no hydrocephalus. 4. Multiple intracranial aneurysms. 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 A 4 mm anterior communicating artery aneurysm is present. 5. No posterior circulation aneurysms or vascular lesions associated with the recent hemorrhage. 6. Moderate white matter changes bilaterally likely reflect the sequela of chronic microvascular ischemia. Electronically Signed   By: Marin Robertshristopher  Mattern M.D.   On: 05/05/2018 20:52   Mr Laqueta JeanBrain W ZOWo Contrast  Result Date: 05/05/2018 CLINICAL DATA:  Known intracranial hemorrhage. EXAM: MRI HEAD WITHOUT AND WITH CONTRAST MRA HEAD WITHOUT CONTRAST TECHNIQUE: Multiplanar, multiecho pulse sequences of the brain and surrounding structures were obtained without and with intravenous contrast. Angiographic images of the head were obtained using MRA technique without contrast. CONTRAST:  11mL MULTIHANCE GADOBENATE DIMEGLUMINE 529 MG/ML IV SOLN COMPARISON:  CT head without contrast 05/03/2018, 05/01/2018, and 04/30/2018. FINDINGS: MRI HEAD FINDINGS Brain: Right occipital craniotomy for debulking of hemorrhage in the right cerebellum is again noted. Residual blood products are present. More lateral infarction is noted. There is edema surrounding the hemorrhage and infarct. No new hemorrhage is present. Mild residual mass effect on the fourth ventricle is again noted without obstruction or hydrocephalus. Moderate periventricular white matter changes are present bilaterally. No significant supratentorial hemorrhage is present. Postcontrast images demonstrate no pathologic enhancement. There is no mass lesion associated with the hemorrhage or adjacent infarct. Ventricles are proportionate to the degree of atrophy. No significant extra-axial fluid collection is present. Vascular: Flow is present in the major intracranial arteries. Skull and upper cervical spine: The skull base is otherwise within normal limits. Craniocervical junction is normal. Sinuses/Orbits: The  paranasal sinuses are clear. There is some fluid in the mastoid air cells bilaterally. No obstructing nasopharyngeal lesion is present. MRA HEAD FINDINGS The time-of-flight images demonstrate no focal vascular lesion associated with the area of hemorrhage. 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 An additional superior left paraophthalmic artery aneurysm measures 3 mm. A 2 mm aneurysm versus infundibulum is present at the right posterior communicating artery. The left A1 and M1 segments are normal. The right A1 segment is  hypoplastic. A 4 mm anterior communicating artery aneurysm is present. The MCA bifurcations are within normal limits bilaterally. There is some attenuation of distal MCA branch vessels without a significant proximal stenosis, occlusion, or aneurysm. There is some attenuation of distal ACA branch vessels as well. The vertebral arteries are codominant. The PICA origins are visualized and normal. The basilar artery is normal. Both posterior cerebral arteries originate from basilar tip. PCA branch vessels are within normal limits proximally. IMPRESSION: 1. Stable appearance of residual right cerebellar hemorrhage status post suboccipital craniotomy. 2. Acute/subacute nonhemorrhagic infarct lateral to the cerebellar hemorrhage. 3. There is edema surrounding the area of infarct and hemorrhage with decreasing mass effect on the fourth ventricle and no hydrocephalus. 4. Multiple intracranial aneurysms. 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 A 4 mm anterior communicating artery aneurysm is present. 5. No posterior circulation aneurysms or vascular lesions associated with the recent hemorrhage. 6. Moderate white matter changes bilaterally likely reflect the sequela of chronic microvascular ischemia. Electronically Signed   By: Marin Roberts M.D.   On: 05/05/2018 20:52    Labs:  Basic Metabolic Panel: BMP Latest Ref Rng & Units 05/17/2018 05/10/2018 05/08/2018  Glucose 65 - 99 mg/dL 485(I) 92 627(O)  BUN 6 - 20 mg/dL 35(K) 17 9  Creatinine 0.44 - 1.00 mg/dL 0.93(G) 1.82 9.93  Sodium 135 - 145 mmol/L 136 140 139  Potassium 3.5 - 5.1 mmol/L 4.4 4.7 3.3(L)  Chloride 101 - 111 mmol/L 102 104 104  CO2 22 - 32 mmol/L 27 29 26   Calcium 8.9 - 10.3 mg/dL 9.3 7.1(I) 9.6(V)    CBC: CBC Latest Ref Rng & Units 05/14/2018 05/08/2018 05/07/2018  WBC 4.0 - 10.5 K/uL 8.8 9.3 9.6  Hemoglobin 12.0 - 15.0 g/dL 11.8(L) 11.7(L) 12.5  Hematocrit 36.0 - 46.0 % 35.5(L) 35.4(L) 37.7  Platelets 150 - 400 K/uL 380 338 358    CBG: No results for input(s): GLUCAP in the last 168 hours.  Brief HPI:   Tiffany Thornton is an 84 year old female with history of hypothyroid otherwise in good health; who was admitted on 04/30/2018 with acute mental status changes with nausea vomiting and vertigo due to hypertensive emergency and right cerebellar hemorrhage.  She had issues with waxing and waning of symptoms and Dr. Yetta Barre was consulted for input.  She was taken to the OR for suboccipital craniectomy with evacuation of hemorrhage.  Postop has had issues with mild dysarthria with ataxia as well as ongoing issues with dizziness.  Scopolamine patch was added added to help with vertigo and mobility was improving.  Dr. Paulina Fusi felt that bleed was due to hypertensive emergency and recommended systolic blood pressure goes less than 160.  She continued to be limited by dizziness with activity as well as poor safety awareness and CIR was recommended due to functional deficits.   Hospital Course: Tiffany Thornton was admitted to rehab 05/07/2018 for inpatient therapies to consist of PT, ST and OT at least three hours five days a week. Past admission physiatrist, therapy team and rehab RN have worked together to provide customized collaborative inpatient rehab. Po intake was  poor due to ongoing issues with nausea and nutritional supplements were added to help with intake. Blood pressures were labile at admission and medications have been titrated to help with better control without side effects.  Lopressor was titrated upwards.  She had issues with lethargy as well as confusion felt to be due to scopolamine patch therefore this was DC'd.   Follow up CBC showed that ABLA is improving. Check of lytes showed hypokalemia that has resolved with supplementation. Headaches were managed with prn use of ultram.  Anxiety, nausea and dizziness are improving and she has had improvement in overall functional status. She continues to have occasional LOB backwards due to vestibular symptoms. She has progressed to supervision level and will continue to receive follow up HHPT, HHOT and HHST by [facility] after discharge.    Rehab course: During patient's stay in rehab weekly team conferences were held to monitor patient's progress, set goals and discuss barriers to discharge. At admission, patient required mod assist with basic self care tasks and  Mobility. She exhibited moderate cognitive cognitive deficits with Moca score 19 out of 30, mild dysarthria and internal distraction. She  has had improvement in activity tolerance, balance, postural control as well as ability to compensate for deficits. She requires supervision to perform transfers and is able to ambulate 200' with RW.  MoCA score has improved by 3 points to 21/30  and she requires supervision with all basic tasks. Family education was completed regarding deficits in awareness, selecting attention and mildly complex tasks as well as all aspects of mobility and self care tasks.      Disposition: Home    Diet: Heart Healthy/Diabetic.   Special Instructions: 1. Needs to increase fluid intake. 2. Family needs to assist with cognitive tasks/medication management.    Discharge Instructions    Ambulatory referral to  Physical Medicine Rehab   Complete by:  As directed    1-2 weeks transitional care appt     Allergies as of 05/22/2018   No Known Allergies     Medication List    STOP taking these medications   aspirin EC 81 MG tablet     TAKE these medications   acetaminophen 325 MG tablet Commonly known as:  TYLENOL Take 1-2 tablets (325-650 mg total) by mouth every 4 (four) hours as needed for mild pain.   amLODipine 10 MG tablet Commonly known as:  NORVASC Take 1 tablet (10 mg total) by mouth daily.   hydrALAZINE 25 MG tablet Commonly known as:  APRESOLINE Take 1 tablet (25 mg total) by mouth every 8 (eight) hours.   hydrochlorothiazide 12.5 MG capsule Commonly known as:  MICROZIDE Take 1 capsule (12.5 mg total) by mouth daily.   levothyroxine 100 MCG tablet Commonly known as:  SYNTHROID, LEVOTHROID Take 1 tablet (100 mcg total) by mouth daily before breakfast.   lisinopril 20 MG tablet Commonly known as:  PRINIVIL,ZESTRIL Take 1 tablet (20 mg total) by mouth 2 (two) times daily.   meclizine 12.5 MG tablet Commonly known as:  ANTIVERT Take 1 tablet (12.5 mg total) by mouth 3 (three) times daily.   metoprolol tartrate 25 MG tablet Commonly known as:  LOPRESSOR Take 1 tablet (25 mg total) by mouth 2 (two) times daily.   multivitamin with minerals Tabs tablet Take 1 tablet by mouth daily.   senna-docusate 8.6-50 MG tablet Commonly known as:  Senokot-S Take 2 tablets by mouth at bedtime.   traMADol 50 MG tablet--Rx # 20 pills  Commonly known as:  ULTRAM Take 1 tablet (50 mg total) by mouth every 6 (six) hours as needed for moderate pain.      Follow-up Information    Kirsteins, Victorino Sparrow, MD Follow up.  Specialty:  Physical Medicine and Rehabilitation Why:  office will call you with follow up appointment Contact information: [address] [place] [address] [phone]        Tia Alert, MD. Call in 1 day(s).   Specialty:  Neurosurgery Why:   for follow up appointment Contact information: [address] [place] [address] [phone]        [facility]. Call in 1 day(s).   Why:  for follow up appointment Contact information: [address]     [address] [place] [address] [phone]       Blair Heys, MD Follow up on 05/29/2018.   Specialty:  Family Medicine Why:  Appointment @ 9:30 am be there @ 9:15 am Contact information: [address] [place] [address] [phone]           Signed: Jacquelynn Cree 06/01/2018, 5:35 PM

## 2018-05-21 NOTE — Progress Notes (Signed)
Patient awoke & was c/o back pain & a headache. She was given her prn pain medication. Will continue to monitor for changes

## 2018-05-21 NOTE — Progress Notes (Signed)
Social Work Patient ID: Tiffany Thornton, female   DOB: 1933-01-19, 84 y.o.   MRN: [id number]  Call from [name]-son who has medical questions and concerned about Mom and her being ready to discharge tomorrow. have asked Dan-PA to contact him to answer his questions and address his concerns.

## 2018-05-21 NOTE — Progress Notes (Signed)
Provided pt education

## 2018-05-21 NOTE — Discharge Instructions (Signed)
Inpatient Rehab Discharge Instructions  Tiffany Thornton Discharge date and time: 05/22/18   Activities/Precautions/ Functional Status: Activity: no lifting, driving, or strenuous exercise for till cleared by MD Diet: cardiac diet Wound Care: keep wound clean and dry.  Contact MD if you develop any problems with your incision/wound--redness, swelling, increase in pain, drainage or if you develop fever or chills.   Functional status:  ___ No restrictions     ___ Walk up steps independently ___ 24/7 supervision/assistance   ___ Walk up steps with assistance ___ Intermittent supervision/assistance  ___ Bathe/dress independently ___ Walk with walker     ___ Bathe/dress with assistance ___ Walk Independently    ___ Shower independently ___ Walk with assistance    ___ Shower with assistance ___ No alcohol     ___ Return to work/school ________   Special Instructions: 1. Need to increase fluid intake to avoid dehydration. 2. Need to pace yourself and stay out of bed as or sit up in a chair during the day. 3. Family needs to assist with medication management  COMMUNITY REFERRALS UPON DISCHARGE:   Home Health:   PT     OT     ST  RN  Agency:[facility] Phone:[phone]   Medical Equipment/Items Ordered:  Rolling walker; bedside commode  Agency/Supplier:  [facility]        Phone:  [phone]  Other:  [facility] [employer]    [phone]  GENERAL COMMUNITY RESOURCES FOR PATIENT/FAMILY: Support Groups:  [facility]                              Meets the second Thursday of each month from 3-4PM (except June, July, and August)                              In [facility] [address] [facility]                               For more information, call [phone]  STROKE/TIA DISCHARGE INSTRUCTIONS SMOKING Cigarette smoking nearly doubles your risk of having a stroke & is the single  most alterable risk factor  If you smoke or have smoked in the last 12 months, you are advised to quit smoking for your health.  Most of the excess cardiovascular risk related to smoking disappears within a year of stopping.  Ask you doctor about anti-smoking medications  [place] Quit Line: 1-800-QUIT NOW  Free Smoking Cessation Classes [phone]  CHOLESTEROL Know your levels; limit fat & cholesterol in your diet  Lipid Panel     Component Value Date/Time   CHOL 183 05/02/2018 0342   TRIG 186 (H) 05/02/2018 0342   HDL 48 05/02/2018 0342   CHOLHDL 3.8 05/02/2018 0342   VLDL 37 05/02/2018 0342   LDLCALC 98 05/02/2018 0342      Many patients benefit from treatment even if their cholesterol is at goal.  Goal: Total Cholesterol (CHOL) less than 160  Goal:  Triglycerides (TRIG) less than 150  Goal:  HDL greater than 40  Goal:  LDL (LDLCALC) less than 100   BLOOD PRESSURE American Stroke Association blood pressure target is less that 120/80 mm/Hg  Your discharge blood pressure is:  BP: Marland Kitchen(!)  148/48  Monitor your blood pressure  Limit your salt and alcohol intake  Many individuals will require more than one medication for high blood pressure  DIABETES (A1c is a blood sugar average for last 3 months) Goal HGBA1c is under 7% (HBGA1c is blood sugar average for last 3 months)  Diabetes:     Lab Results  Component Value Date   HGBA1C 5.8 (H) 05/02/2018     Your HGBA1c can be lowered with medications, healthy diet, and exercise.  Check your blood sugar as directed by your physician  Call your physician if you experience unexplained or low blood sugars.  PHYSICAL ACTIVITY/REHABILITATION Goal is 30 minutes at least 4 days per week  Activity: No driving, Therapies: see above Return to work: N/A  Activity decreases your risk of heart attack and stroke and makes your heart stronger.  It helps control your weight and blood pressure; helps you relax and can improve your  mood.  Participate in a regular exercise program.  Talk with your doctor about the best form of exercise for you (dancing, walking, swimming, cycling).  DIET/WEIGHT Goal is to maintain a healthy weight  Your discharge diet is:  Diet Order           Diet Carb Modified Fluid consistency: Thin; Room service appropriate? Yes  Diet effective now         liquids Your height is:  Height: 5\' 7"  (170.2 cm) Your current weight is: Weight: 62.3 kg (137 lb 5.6 oz) Your Body Mass Index (BMI) is:  BMI (Calculated): 21.51  Following the type of diet specifically designed for you will help prevent another stroke.  You are at goal weight   Your goal Body Mass Index (BMI) is 19-24.  Healthy food habits can help reduce 3 risk factors for stroke:  High cholesterol, hypertension, and excess weight.  RESOURCES Stroke/Support Group:  Call [phone]   STROKE EDUCATION PROVIDED/REVIEWED AND GIVEN TO PATIENT Stroke warning signs and symptoms How to activate emergency medical system (call 911). Medications prescribed at discharge. Need for follow-up after discharge. Personal risk factors for stroke. Pneumonia vaccine given:  Flu vaccine given:  My questions have been answered, the writing is legible, and I understand these instructions.  I will adhere to these goals & educational materials that have been provided to me after my discharge from the hospital.     My questions have been answered and I understand these instructions. I will adhere to these goals and the provided educational materials after my discharge from the hospital.  Patient/Caregiver Signature _______________________________ Date __________  Clinician Signature _______________________________________ Date __________  Please bring this form and your medication list with you to all your follow-up doctor's appointments.

## 2018-05-21 NOTE — Progress Notes (Signed)
Pt seen this am with son at bedside, pt reports that had bad night with bad headache, dizziness and nausea/vomiting. PRN's were provided yesterday and overnight with relief for headache but n/v still continue with c/o of dizziness.  Made PA aware.  MD also discussed with pt and son as well.  Additionally I reminded pt that will take time for s/s relief.  Offered handouts.  Also asked why scopolamine patched stopped. Pt unable to recall at this point. Pt unable to attend therapy this am due to dizziness and n/v. PRN compazine provided with only little effect.

## 2018-05-21 NOTE — Progress Notes (Signed)
Physical Therapy Discharge Summary  Patient Details  Name: Tiffany Thornton MRN: [id number] Date of Birth: 1933-05-01  Today's Date: 05/22/2018   Patient has met 8 of 8 long term goals due to improved activity tolerance, improved balance, improved postural control, increased strength, ability to compensate for deficits, functional use of  right upper extremity and right lower extremity and improved coordination.  Patient to discharge at an ambulatory level supervision with RW.   Patient family has been thoroughly educated on pt's need for 24 hr assistance upon d/c, and they have participated in caregiver training.  Reasons goals not met: n/a  Recommendation:  Patient will benefit from ongoing skilled PT services in home health setting to continue to advance safe functional mobility, address ongoing impairments in coordination (R ataxia, UE>LE), endurance, strength, balance, safety awareness, cognition, gait, transfers, and minimize fall risk.  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 Erlinda Hong Restrictions Weight Bearing Restrictions: No  Vital Signs Therapy Vitals Pulse Rate: 60 BP: 137/61 Patient Position (if appropriate): Lying  Vision/Perception  Pt wears glasses for reading only at baseline. Pt reports no changes in baseline vision.  Cognition Overall Cognitive Status: Impaired/Different from baseline Arousal/Alertness: Awake/alert Orientation Level: Oriented X4 Awareness: Impaired Awareness Impairment: Anticipatory impairment;Emergent impairment Problem Solving: Impaired Executive Function: Reasoning;Self Correcting Reasoning: Impaired Self Correcting: Impaired Behaviors:  Impulsive Safety/Judgment: Impaired Comments: Decreased safety awareness & poor awareness of funcitonal deficits & impact on functional mobility.  Sensation Sensation Light Touch: (pt denies numbness & tingling in BLE/BUE) Pt reports sensation is equal and intact BLE to light touch. Coordination Gross Motor Movements are Fluid and Coordinated: No Fine Motor Movements are Fluid and Coordinated: No Coordination and Movement Description: R ataxia (UE>LE)   Motor  Motor Motor: Hemiplegia;Ataxia Motor - Discharge Observations: RUE/RLE ataxia, generalized weakness, poor endurance/activity tolerance   Mobility Bed Mobility Bed Mobility: Rolling Right;Rolling Left;Sit to Supine;Supine to Sit Rolling Right: Independent Rolling Left: Independent Supine to Sit: Independent Sit to Supine: Independent Transfers Transfers: Sit to Stand;Stand Pivot Transfers Sit to Stand: Supervision/Verbal cueing(with armrests) Stand Pivot Transfers: Supervision/Verbal cueing(cuing for hand placement, sequencing & safety, cuing for use of armrests & bed rails)  Locomotion  Gait Ambulation: Yes Gait Assistance: Supervision/Verbal cueing Gait Distance (Feet): 200 Feet Assistive device: Rolling walker Gait Gait: Yes Gait Pattern: (ataxia RLE, decreased step length BLE, decreased stride length, decreased gait speed) Stairs / Additional Locomotion Stairs: Yes Stairs Assistance: Supervision/Verbal cueing(cuing for compensatory pattern & clearing RLE) Stair Management Technique: Two rails Number of Stairs: 24 Height of Stairs: (6" + 3") Ramp: Contact Guard/touching assist(ambulatory with RW) Curb: Contact Guard/Touching assist(with RW, cuing for sequencing & clearing RLE) Product manager Mobility: Yes Wheelchair Assistance: Chartered loss adjuster: Both upper extremities Wheelchair Parts Management: Needs assistance Distance: 150 ft    Trunk/Postural Assessment   Cervical Assessment Cervical Assessment: Within Functional Limits Thoracic Assessment Thoracic Assessment: Within Functional Limits Lumbar Assessment Lumbar Assessment: Within Functional Limits Postural Control Postural Control: Deficits on evaluation(impaired righting reactions)   Balance Balance Balance Assessed: Yes Dynamic Standing Balance Dynamic Standing - Balance Support: No upper extremity supported Dynamic Standing - Level of Assistance: 5: Stand by assistance Dynamic Standing - Balance Activities: (during stand pivot transfer)  Extremity Assessment  BLE ROM WFL B hip  flexion 4/5, unable to assess B knee flexion or extension 2/2 pain with touch, B ankle dorsiflexion 4/5  See Function Navigator for Current Functional Status.  Waunita Thornton 05/22/2018, 3:31 PM

## 2018-05-21 NOTE — Progress Notes (Signed)
Occupational Therapy Discharge Summary  Patient Details  Name: Tiffany Thornton MRN: [id number] Date of Birth: 03-Jul-1933   Patient has met 69 of 91 long term goals due to improved activity tolerance, improved balance, postural control, ability to compensate for deficits, functional use of  RIGHT upper extremity, improved attention, improved awareness and improved coordination.  Patient to discharge at overall Supervision level.  Patient's care partner is independent to provide the necessary physical and cognitive assistance at discharge.  Pt's family has completed hands on family education training. They have demonstrates and voiced willing and ableness to provide needed physical and cognitive assist at d/c, aware of recommendation for close 24 hr supervision at d/c  Recommendation:  Patient will benefit from ongoing skilled OT services in home health setting to continue to advance functional skills in the area of BADL, iADL and Reduce care partner burden.  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 Restrictions Weight Bearing Restrictions: No Vision Baseline Vision/History: Wears glasses Wears Glasses: Reading only Patient Visual Report: No change from baseline Vision Assessment?: No apparent visual deficits Eye Alignment: Within Functional Limits Ocular Range of Motion: Within Functional Limits Convergence: Within functional limits Visual Fields: No apparent deficits Perception  Perception: Within Functional Limits Praxis Praxis: Intact Cognition Overall Cognitive Status: Impaired/Different from baseline Arousal/Alertness: Awake/alert Orientation Level: Oriented X4 Memory: Impaired Awareness: Impaired Awareness Impairment: Anticipatory  impairment;Emergent impairment Problem Solving: Impaired Problem Solving Impairment: Verbal complex;Functional complex Executive Function: Reasoning;Self Correcting Reasoning: Impaired Reasoning Impairment: Functional complex;Verbal complex Self Correcting: Impaired Behaviors: Impulsive Safety/Judgment: Impaired Comments: Decreased safety awareness & poor awareness of funcitonal deficits & impact on functional mobility. Sensation Sensation Light Touch: Appears Intact Proprioception: Appears Intact Coordination Gross Motor Movements are Fluid and Coordinated: No Fine Motor Movements are Fluid and Coordinated: No Coordination and Movement Description: R ataxia (UE>LE) Finger Nose Finger Test: Decreased accuracy R UE; WFL L UE Motor  Motor Motor: Hemiplegia;Ataxia Motor - Discharge Observations: RUE/RLE ataxia, generalized weakness, poor endurance/activity tolerance Mobility  Transfers Sit to Stand: Supervision/Verbal cueing(with armrests)  Trunk/Postural Assessment  Cervical Assessment Cervical Assessment: Within Functional Limits Thoracic Assessment Thoracic Assessment: Within Functional Limits Lumbar Assessment Lumbar Assessment: Within Functional Limits Postural Control Postural Control: Deficits on evaluation(Impaired righting reactions)  Balance Balance Balance Assessed: Yes Dynamic Sitting Balance Dynamic Sitting - Balance Support: During functional activity;Feet supported Dynamic Sitting - Level of Assistance: 6: Modified independent (Device/Increase time) Static Standing Balance Static Standing - Balance Support: During functional activity;No upper extremity supported Static Standing - Level of Assistance: 5: Stand by assistance Dynamic Standing Balance Dynamic Standing - Balance Support: During functional activity;Right upper extremity supported;Left upper extremity supported Dynamic Standing - Level of Assistance: 5: Stand by assistance Extremity/Trunk  Assessment RUE Assessment RUE Assessment: Within Functional Limits General Strength Comments: 5/5 throughout with full ROM LUE Assessment LUE Assessment: Within Functional Limits General Strength Comments: 5/5 throughout with full ROM   See Function Navigator for Current Functional Status.  Tiffiany Beadles L 05/22/2018, 12:50 PM

## 2018-05-21 NOTE — Progress Notes (Signed)
Speech Language Pathology Daily Session Note  Patient Details  Name: Tiffany Thornton MRN: [id number] Date of Birth: 01/09/1933  Today's Date: 05/21/2018 SLP Individual Time: 1345-1415 SLP Individual Time Calculation (min): 30 min  Short Term Goals: Week 2: SLP Short Term Goal 1 (Week 2): Pt will demonstrate selective attention in mildly distractioning enviorment to functional task for 45 minutes with supervision A verbal cues for redirection. SLP Short Term Goal 2 (Week 2): Pt will demonstrate 2 safety precuations during functional tasks with supervision A verbal cues.  SLP Short Term Goal 3 (Week 2): Pt will utilize external memory aid to recall new, daily information with Supervision A verbal cues.  SLP Short Term Goal 4 (Week 2): Pt will utilize speech intelligibility strategies at the sentence level with supervision A verbal cues to achieve 80% intelligibility.  SLP Short Term Goal 5 (Week 2): Pt will complete semi-complex functional problem solving tasks with supervision A question and verbal cues. SLP Short Term Goal 6 (Week 2): Pt will demonstrate self-monitoring and self-correction in functional problem solving tasks with supervision A verbal and question cues.  Skilled Therapeutic Interventions: Skilled treatment session focused on cognition goals. SLP received pt in bed with lights off d/t n/v. Pt willing to participate despite not feeling well. To some extend, pt's increased dizziness and n/v helps her not feel well and not attempt to perform tasks - therefore she (in the moment) realizes that she requires help at discharge from family d/t feeling so poorly. However this is guarded because she always states "she will find a way." Pt is able to describe why she needs help in home which is progress. She appears less resistant. Pt left supine in bed, bed alarm on and all needs within reach. Continue per current plan of care.       Function:    Cognition Comprehension Comprehension assist  level: Follows basic conversation/direction with no assist  Expression   Expression assist level: Expresses complex ideas: With extra time/assistive device  Social Interaction Social Interaction assist level: Interacts appropriately 75 - 89% of the time - Needs redirection for appropriate language or to initiate interaction.  Problem Solving Problem solving assist level: Solves basic problems with no assist  Memory Memory assist level: More than reasonable amount of time    Pain    Therapy/Group: Individual Therapy  Shawntelle Ungar 05/21/2018, 2:03 PM

## 2018-05-22 ENCOUNTER — Inpatient Hospital Stay (HOSPITAL_COMMUNITY): Payer: Medicare Other | Admitting: Physical Therapy

## 2018-05-22 ENCOUNTER — Inpatient Hospital Stay (HOSPITAL_COMMUNITY): Payer: Medicare Other

## 2018-05-22 ENCOUNTER — Inpatient Hospital Stay (HOSPITAL_COMMUNITY): Payer: Medicare Other | Admitting: Occupational Therapy

## 2018-05-22 DIAGNOSIS — I10 Essential (primary) hypertension: Secondary | ICD-10-CM

## 2018-05-22 MED ORDER — SENNOSIDES-DOCUSATE SODIUM 8.6-50 MG PO TABS: 2.0000 | ORAL_TABLET | Freq: Every day | ORAL | Status: DC

## 2018-05-22 MED ORDER — TRAMADOL HCL 50 MG PO TABS: 50.0000 mg | ORAL_TABLET | Freq: Four times a day (QID) | ORAL | 0 refills | Status: DC | PRN

## 2018-05-22 MED ORDER — HYDROCHLOROTHIAZIDE 12.5 MG PO CAPS: 12.5000 mg | ORAL_CAPSULE | Freq: Every day | ORAL | 1 refills | Status: DC

## 2018-05-22 MED ORDER — METOPROLOL TARTRATE 25 MG PO TABS: 25.0000 mg | ORAL_TABLET | Freq: Two times a day (BID) | ORAL | 1 refills | Status: DC

## 2018-05-22 MED ORDER — HYDRALAZINE HCL 25 MG PO TABS: 25.0000 mg | ORAL_TABLET | Freq: Three times a day (TID) | ORAL | 1 refills | Status: DC

## 2018-05-22 MED ORDER — LISINOPRIL 20 MG PO TABS: 20.0000 mg | ORAL_TABLET | Freq: Two times a day (BID) | ORAL | 1 refills | Status: DC

## 2018-05-22 MED ORDER — AMLODIPINE BESYLATE 10 MG PO TABS: 10.0000 mg | ORAL_TABLET | Freq: Every day | ORAL | 1 refills | Status: DC

## 2018-05-22 MED ORDER — LEVOTHYROXINE SODIUM 100 MCG PO TABS: 100.0000 ug | ORAL_TABLET | Freq: Every day | ORAL | 0 refills | Status: DC

## 2018-05-22 MED ORDER — MECLIZINE HCL 12.5 MG PO TABS: 12.5000 mg | ORAL_TABLET | Freq: Three times a day (TID) | ORAL | 1 refills | Status: DC

## 2018-05-22 NOTE — Progress Notes (Signed)
Recreational Therapy Discharge Summary Patient Details  Name: Yehuda Budddith S Faso MRN: [id number] Date of Birth: [birth year] Today's Date: 05/22/2018  Comments on progress toward goals: Pt is scheduled for discharge home today wit family to provide 24 hours supervision.  Pt educated on activity analysis with potential modifications during initial evaluation.  Reasons for discharge: discharge from hospital Patient/family agrees with progress made and goals achieved: Yes  Neria Procter 05/22/2018, 8:18 AM

## 2018-05-22 NOTE — Progress Notes (Addendum)
Physical Therapy Session Note  Patient Details  Name: Tiffany Thornton MRN: [id number] Date of Birth: September 29, 1933  Today's Date: 05/22/2018 PT Individual Time: [id number] and [id number] PT Individual Time Calculation (min): 43 min and 57 min  Short Term Goals: Week 2:  PT Short Term Goal 2 (Week 2): STG = LTG due to estimated ELOS.  Skilled Therapeutic Interventions/Progress Updates:  Treatment 1: Pt received in bed & agreeable to tx. Pt only c/o pain in BLE with palpation when performing manual muscle testing. Pt transferred in/out of hospital bed with bed features and mod I. Pt completes stand pivot and sit>stand transfers during session with supervision and cuing for technique and utilization of armrests, sequencing, and bed rails during stand pivot as pt will frequently lose balance posteriorly and abruptly return to sitting in w/c 2/2 minimal anterior weight shifting & impaired safety awareness. Pt completes car transfer at [employer] simulated height (27" seat) with RW and supervision with cuing for technique. Pt negotiates ramp, uneven surface, and curb with RW with steady assist with cuing for sequencing stepping up curb and for RLE foot clearance. Pt negotiates 24 steps (6" + 3") with B rails and supervision; pt unable to demonstrate compensatory pattern even with cuing and instead elects to use step-over-step pattern with cuing to ensure R foot clearance when stepping up steps. Returned to room & pt called son, requesting him to bring the [employer] in for her d/c but he reports he will bring an SUV. Pt's son to measure seat height of SUV & let patient know so this can be practiced this afternoon. At end of session pt left in bed with alarm set & needs within reach. Pt remains impulsive, with decreased awareness of functional deficits & current functional status, as well as need for assistance upon d/c.   Treatment 2: Pt received in bed & agreeable to tx. No c/o pain reported. Pt reports her son measured his SUV  and the seat height is 30" tall. Pt transferred out of bed and to w/c via stand pivot with supervision. Pt propels w/c room>ortho gym with BUE & distant supervision in controlled environment. Pt completed car transfer from 30" seat height with supervision. In apartment pt completed bed mobility in regular bed with mod I and transfer from low compliant couch with supervision. Pt ambulates 200 ft with RW and supervision with decreased gait speed. Pt completes floor transfer with steady assist to get onto floor and supervision with extra time to get off of floor by pushing up to sit on mat - educated pt to crawl to sturdy couch to get up if she falls at home & discussed situations in which she should call EMS. Pt utilized nu-step on level 3 x 8 minutes with all four extremities with task focusing on coordination of reciprocal movements and endurance training. At end of session pt left in bed with alarm set & all needs within reach.   Educated pt on signs to look for and risk of another stroke following first event; educated pt on importance of taking medication at home and assistance from family members.   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   Therapy/Group: Individual Therapy  Tiffany Thornton 05/22/2018, 3:21 PM

## 2018-05-22 NOTE — Progress Notes (Signed)
Patient and son received discharge instructions from Deatra Inaan Angiulli, PA-C with verbal understanding. Patient discharged to home with son and patient belongings.

## 2018-05-22 NOTE — Progress Notes (Signed)
Subjective/Complaints:  Feeling better today. Much less nausea, denies headache. Very nervous about discharge  ROS: Patient denies fever, rash, sore throat, blurred vision, nausea, vomiting, diarrhea, cough, shortness of breath or chest pain, joint or back pain, headache, or mood change.     Objective: Vital Signs: Blood pressure (!) 120/57, pulse 69, temperature 98.7 F (37.1 C), temperature source Oral, resp. rate 17, height 5\' 7"  (1.702 m), weight 62.3 kg (137 lb 5.6 oz), SpO2 96 %. No results found. No results found for this or any previous visit (from the past 72 hour(s)).   Constitutional: No distress . Vital signs reviewed. HEENT: EOMI, oral membranes moist Neck: supple Cardiovascular: RRR without murmur. No JVD    Respiratory: CTA Bilaterally without wheezes or rales. Normal effort    GI: BS +, non-tender, non-distended  Skin:   Bruise diffuse ecchymosis in BUE and BLE improving Neuro: Alert/Oriented,   Normal Sensory, Normal Motor, Abnormal FMC Ataxic/ bilateral, right more than left.  Musc/Skel:  Other no pain with UE or LE ROM Psych: pleasant but very anxious.    Assessment/Plan: 1. 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 Physiatrist is providing close team supervision and 24 hour management of active medical problems listed below. Physiatrist and rehab team continue to assess barriers to discharge/monitor patient progress toward functional and medical goals. FIM: Function - Bathing Bathing activity did not occur: Refused Position: Shower Body parts bathed by patient: Right arm, Left arm, Chest, Abdomen, Front perineal area, Buttocks, Right upper leg, Left upper leg, Right lower leg, Left lower leg, Back Body parts bathed by helper: Back Assist Level: Supervision or verbal cues  Function- Upper Body  Dressing/Undressing Upper body dressing/undressing activity did not occur: Refused What is the patient wearing?: Bra, Pull over shirt/dress Bra - Perfomed by patient: Thread/unthread right bra strap, Thread/unthread left bra strap, Hook/unhook bra (pull down sports bra) Pull over shirt/dress - Perfomed by patient: Thread/unthread right sleeve, Thread/unthread left sleeve, Put head through opening, Pull shirt over trunk Assist Level: Set up Set up : To obtain clothing/put away Function - Lower Body Dressing/Undressing Lower body dressing/undressing activity did not occur: Refused What is the patient wearing?: Underwear, Pants, Shoes Position: Wheelchair/chair at sink Underwear - Performed by patient: Thread/unthread right underwear leg, Pull underwear up/down, Thread/unthread left underwear leg Underwear - Performed by helper: Thread/unthread left underwear leg Pants- Performed by patient: Thread/unthread right pants leg, Pull pants up/down, Thread/unthread left pants leg Pants- Performed by helper: Thread/unthread left pants leg Shoes - Performed by patient: Don/doff right shoe, Don/doff left shoe(elastic shoelaces) Shoes - Performed by helper: Don/doff right shoe, Fasten right TED Hose - Performed by helper: Don/doff right TED hose, Don/doff left TED hose Assist for footwear: Supervision/touching assist Assist for lower body dressing: Supervision or verbal cues  Function - Toileting Toileting steps completed by patient: Adjust clothing prior to toileting, Adjust clothing after toileting, Performs perineal hygiene Toileting steps completed by helper: Performs perineal hygiene, Adjust clothing after toileting(per NT report) Chief Executive OfficerToileting Assistive Devices: Grab bar or rail Assist level: Supervision or verbal cues  Function Programmer, multimedia- Toilet Transfers Toilet transfer assistive device: Grab bar, Elevated toilet seat/BSC over toilet, Walker Assist level to toilet: Supervision or verbal cues Assist level  from toilet: Supervision or verbal cues  Function - Chair/bed transfer Chair/bed transfer method: Squat pivot Chair/bed transfer assist level: Supervision or verbal cues Chair/bed transfer assistive  device: Armrests Chair/bed transfer details: Verbal cues for technique  Function - Locomotion: Wheelchair Will patient use wheelchair at discharge?: Yes Type: Manual Max wheelchair distance: 150' Assist Level: Supervision or verbal cues Assist Level: Supervision or verbal cues Assist Level: Supervision or verbal cues Turns around,maneuvers to table,bed, and toilet,negotiates 3% grade,maneuvers on rugs and over doorsills: No Function - Locomotion: Ambulation Assistive device: Walker-rolling Max distance: 150' Assist level: Supervision or verbal cues Assist level: Supervision or verbal cues Walk 50 feet with 2 turns activity did not occur: Safety/medical concerns Assist level: Supervision or verbal cues Walk 150 feet activity did not occur: Safety/medical concerns Assist level: Supervision or verbal cues Walk 10 feet on uneven surfaces activity did not occur: Safety/medical concerns  Function - Comprehension Comprehension: Auditory Comprehension assist level: Follows basic conversation/direction with no assist  Function - Expression Expression: Verbal Expression assist level: Expresses basic needs/ideas: With no assist  Function - Social Interaction Social Interaction assist level: Interacts appropriately with others with medication or extra time (anti-anxiety, antidepressant).  Function - Problem Solving Problem solving assist level: Solves complex 90% of the time/cues < 10% of the time, Solves complex problems: With extra time  Function - Memory Memory assist level: More than reasonable amount of time Patient normally able to recall (first 3 days only): Current season, That he or she is in a hospital, Staff names and faces  Medical Problem List and Plan: 1.  Deficits with  mobility, ataxia secondary to right cerebellar hemorrhage and infarct s/p evacuation. HCT 6/19 reviewed and unchanged no new bleeding  -dc home today  -Patient to see Rehab MD/provider in the office for transitional care encounter in 1-2 weeks.  2.  DVT Prophylaxis/Anticoagulation: dc sq lovenox 3. Pain Management: hydrocodone prn for pain.   -encouraged use of as needed  4. Mood: LCSW to follow for evaluation and support.  5. Neuropsych: This patient is not fully capable of making decisions on her own behalf. 6. Skin/Wound Care: Monitor wound for healing. Maintain adequate nutritional and hydration status.  7. Fluids/Electrolytes/Nutrition: eating well despite intermittent n/v     8. HTN: catapres for prn use.   Apresoline 10mg  TID, lisinopril 20mg  BID Vitals:   05/21/18 2117 05/22/18 0443  BP: (!) 136/54 (!) 120/57  Pulse: 76 69  Resp: 18 17  Temp: 98.8 F (37.1 C) 98.7 F (37.1 C)  SpO2: 95% 96%  increased lopressor, decreased hydralazine has helped to stabilize bp's. May be able to dc hydralazine entirely 9. Low calorie malnutrition:  improved intake 10. Pre diabetes: Hgb A1C- 5.8  11. Hypokalemia: Supplement today with Kdur and Mag Ox. Improved 6/13 12. Dizziness: Central vestibular dysfunction overall improving. head movements still can provoke symptoms as we discussed  -meclizine prn  -anxiety component   LOS (Days) 15 A FACE TO FACE EVALUATION WAS PERFORMED  Ranelle Oyster 05/22/2018, 9:06 AM

## 2018-05-22 NOTE — Progress Notes (Signed)
Social Work  Discharge Note  The overall goal for the admission was met for:   Discharge location: [facility]  Length of Stay: Yes-15 DAYS  Discharge activity level: Yes-SUPERVISION LEVEL  Home/community participation: Yes  Services provided included: MD, RD, PT, OT, SLP, RN, CM, Pharmacy and SW  Financial Services: Private Insurance: [facility]  Follow-up services arranged: Home Health: [facility]-PT,OT,SP,RN, DME: [facility] & 3 IN 1 and Patient/Family has no preference for HH/DME agencies  Comments (or additional information):CHILDREN HAVE BEEN IN FOR EDUCATION AWARE PT WILL REQUIRE 24 HR CARE AND PLAN TO PROVIDE THIS.  Patient/Family verbalized understanding of follow-up arrangements: Yes  Individual responsible for coordination of the follow-up plan: [name]-SON/CHILDREN  Confirmed correct DME delivered: Elease Hashimoto 05/22/2018    Elease Hashimoto

## 2018-05-22 NOTE — Progress Notes (Signed)
Speech Language Pathology Discharge Summary  Patient Details  Name: Tiffany Thornton MRN: [id number] Date of Birth: 06/24/1933  Today's Date: 05/22/2018 SLP Individual Time: 0830-0900 SLP Individual Time Calculation (min): 30 min   Skilled Therapeutic Interventions:Skilled ST services focused on cognitive skills and eductaion. SLP facilitated reassessment of cognitive skills with MOCA version 7.2 , pt scored 21 out 30, three points higher than on evaluation. SLP reviewed results with pt and education pt on continued deficits in higher level attention/problem solving, recall of novel information, and emergent awareness. Pt stated understanding, however stated "when I get home I will just do better." SLP assisted pt to bed, pt demonstrated use of safety precautions. Pt was left in room with call bell within reach and bed alaram set.ST reccomends to continuedd skilled ST services.       Patient has met 4 of 4 long term goals.  Patient to discharge at overall Supervision level.  Reasons goals not met:     Clinical Impression/Discharge Summary:   Pt demonstrated great progress meeting 4 out 4 goals, discharging at supervision A for basic skills, however continued impairment in emergent/anticipatpry awareness makes pt at risk upon returning to home setting. Pt continues to demonstrate deficits in recall, selective attention, mildly complex problem solving and overall awareness of deficits. Pt demonstrated increase in cognitive ability, however is not able to be left alone due to impairment in awareness/safety. Pt would continue to benefit from skilled ST services at next level of care in order to increase functional independence and reduce burden of care requiring 24 hour supervision.   Care Partner:  Caregiver Able to Provide Assistance: Yes     Recommendation:  24 hour supervision/assistance;Outpatient SLP;Home Health SLP  Rationale for SLP Follow Up: Maximize cognitive function and  independence;Reduce caregiver burden   Equipment:     Reasons for discharge: Discharged from hospital   Patient/Family Agrees with Progress Made and Goals Achieved: Yes   Function:  Eating Eating                 Cognition Comprehension Comprehension assist level: Follows basic conversation/direction with no assist  Expression   Expression assist level: Expresses basic needs/ideas: With no assist  Social Interaction Social Interaction assist level: Interacts appropriately with others with medication or extra time (anti-anxiety, antidepressant).  Problem Solving Problem solving assist level: Solves basic problems with no assist  Memory Memory assist level: Recognizes or recalls 75 - 89% of the time/requires cueing 10 - 24% of the time;Recognizes or recalls 50 - 74% of the time/requires cueing 25 - 49% of the time   [name]  [facility] 05/22/2018, 5:13 PM

## 2018-05-22 NOTE — Progress Notes (Signed)
Occupational Therapy Session Note  Patient Details  Name: Tiffany Thornton MRN: [id number] Date of Birth: 05-30-1933  Today's Date: 05/22/2018 OT Individual Time: [id number] OT Individual Time Calculation (min): 58 min    Short Term Goals: Week 2:  OT Short Term Goal 1 (Week 2): STG=LTG due to LOS  Skilled Therapeutic Interventions/Progress Updates:    Pt seen for OT ADL bathing/dressing session. Pt awake in supine upon arrival, voicing feeling much better than yesterday and feeling ready for planned d/c home today. She ambulated throughout session with RW and supervision, noted with posterior bias though able to self correct to prevent LOB. Functional transfers completed throughout session with supervision, including from toilet and onto tub bench in walk in shower. She bathed seated on tub bench with distant supervision. Pt requiring VCs for safety awareness when exiting shower, attempting to stand on one leg to dry off LEs, education provided. One posterior LOB episode when returning to chair to dress, pt stating "oh yes, I fall backwards a lot", cont with education regarding reducing fall risk and need for supervision assist at d/c, pt voiced understanding.  She completed grooming tasks standing at sink, leaning into sink at assist with balance and energy conservation.  Following seated rest break, she ambulated throughout room with RW to collect and pack belongings in prep for d/c home today. Pt able to use R UE at dominant level to collect needed items, pt verbalized fatigue, however, required cuing and encouragement from therapist to initiate seated rest break.  Pt left seated in w/c with chair alarm on at end of session, awaiting hand off to SLP.    Therapy Documentation Precautions:  Precautions Precautions: Fall Restrictions Weight Bearing Restrictions: No Pain:    No/denies pain  See Function Navigator for Current Functional Status.   Therapy/Group: Individual  Therapy  Phylliss Strege L 05/22/2018, 7:01 AM

## 2018-05-24 ENCOUNTER — Telehealth: Payer: Self-pay

## 2018-05-24 NOTE — Telephone Encounter (Signed)
Transitional Care call-patient and sister of patient    1. Are you/is patient experiencing any problems since coming home? Are there any questions regarding any aspect of care? 2. Are there any questions regarding medications administration/dosing? Are meds being taken as prescribed? Patient should review meds with caller to confirm 3. Have there been any falls? 4. Has Home Health been to the house and/or have they contacted you?No If not, have you tried to contact them? Can we help you contact them? 5. Are bowels and bladder emptying properly? Are there any unexpected incontinence issues? If applicable, is patient following bowel/bladder programs? 6. Any fevers, problems with breathing, unexpected pain? 7. Are there any skin problems or new areas of breakdown? 8. Has the patient/family member arranged specialty MD follow up (ie cardiology/neurology/renal/surgical/etc)?Yes  Can we help arrange? 9. Does the patient need any other services or support that we can help arrange? 10. Are caregivers following through as expected in assisting the patient?Yes 11. Has the patient quit smoking, drinking alcohol, or using drugs as recommended?No  Appointment time, Friday 06/01/18 arrive at 2:00 for 2:30 [address]   I talked to patient and sister of patient. Patient states she was told in the hospital to only see Dr. Manus GunningEhinger for f/u. Sister did take down the appt and phone number for pt son [name] to call back to answer rest of questions.

## 2018-05-25 NOTE — Telephone Encounter (Signed)
Transitional Care call Transitional Care Call Questions answered by Son : Tiffany Thornton  Patient name: Tiffany Thornton DOB: Oct 17, 1933 1. Are you/is patient experiencing any problems since coming home? No a. Are there any questions regarding any aspect of care? No 2. Are there any questions regarding medications administration/dosing? No a. Are meds being taken as prescribed? Yes b. "Patient should review meds with caller to confirm" Medication List reviewed 3. Have there been any falls? No 4. Has Home Health been to the house and/or have they contacted you? Tiffany Thornton stated [facility] called once, no one has been to the home. He will placed another call today, instructed to call if no one comes out. He verbalizes understanding.  a. If not, have you tried to contact them? Yes b. Can we help you contact them? Not at this time, 5. Are bowels and bladder emptying properly? Yes a. Are there any unexpected incontinence issues? No b. If applicable, is patient following bowel/bladder programs? NA 6. Any fevers, problems with breathing, unexpected pain? No 7. Are there any skin problems or new areas of breakdown? No 8. Has the patient/family member arranged specialty MD follow up (ie cardiology/neurology/renal/surgical/etc.)?  Tiffany Thornton was instructed to call for follow up appointments, he verbalizes understanding.  a. Can we help arrange? No 9. Does the patient need any other services or support that we can help arrange? No 10. Are caregivers following through as expected in assisting the patient? Yes 11. Has the patient quit smoking, drinking alcohol, or using drugs as recommended? Question was answered by sister. She denied Tiffany Thornton smokes, drink alcohol or uses illicit drugs.   Appointment date/time June 01, 2018, arrival time 2:00 for 2:30 appointment with Dr. Wynn BankerKirsteins. At [address]

## 2018-05-25 NOTE — Telephone Encounter (Signed)
Placed a call to Tiffany Thornton, no answer. Left message to return the call.

## 2018-05-29 ENCOUNTER — Other Ambulatory Visit: Payer: Self-pay

## 2018-05-29 NOTE — Patient Outreach (Addendum)
[facility] Care Management  05/29/2018  Tiffany Thornton Dec 26, 1932 [id number]  EMMI: stroke red alert Referral date: 05/29/18 Referral reason: smoked or been around smoke: yes Insurance: [employer] Day # 6  Telephone call to patient regarding EMMI stroke red alert. HIPAA verified.  Explained reason for call. Patient states she was smoking 3-4 cigarettes per day. Patient states she has not smoked any cigarettes for at least 1 to 1 1/2 month. RNCM discussed continued smoking cessation with patient.  Patient states she has a follow up appointment with the neurosurgeon within 1 week and follow up with Tiffany Thornton, with Physical medicine and rehab on 06/01/18. Patient reports she has transportation to her appointments.  Patient states she take her medications as prescribed. State her children fill her pill box for her.  Patient states she is receiving home health therapy. RNCM reviewed signs/ symptoms of stroke with patient. Advised to call 911 for stroke like symptoms.  Patient denies any further needs/ concerns at this time.  RNCM explained to patient she would received additional EMMI automated calls. Patient declined additional calls.  RNCM advised patient to notify MD of any changes in condition prior to scheduled appointment. RNCM provided contact name and number: [phone] or main office number [phone] and 24 hour nurse advise line [phone].  RNCM verified patient aware of 911 services for urgent/ emergent needs.   PLAN: RNCM will close patient due to patient being assessed and having no further needs.  RNCM will send closure notification  To patients primary MD  RNCM will send patient [facility] care management brochure/ magnet  Tiffany InaDavina Eveline Sauve RN,BSN,CCM [facility] Telephonic  [phone]

## 2018-06-01 ENCOUNTER — Ambulatory Visit (HOSPITAL_BASED_OUTPATIENT_CLINIC_OR_DEPARTMENT_OTHER): Payer: Medicare Other | Admitting: Physical Medicine & Rehabilitation

## 2018-06-01 ENCOUNTER — Encounter: Payer: Medicare Other | Attending: Physical Medicine & Rehabilitation

## 2018-06-01 ENCOUNTER — Other Ambulatory Visit: Payer: Self-pay

## 2018-06-01 ENCOUNTER — Encounter: Payer: Self-pay | Admitting: Physical Medicine & Rehabilitation

## 2018-06-01 VITALS — BP 147/62 | HR 72 | Ht 66.0 in | Wt 138.0 lb

## 2018-06-01 DIAGNOSIS — Z90710 Acquired absence of both cervix and uterus: Secondary | ICD-10-CM | POA: Diagnosis not present

## 2018-06-01 DIAGNOSIS — Z87891 Personal history of nicotine dependence: Secondary | ICD-10-CM | POA: Insufficient documentation

## 2018-06-01 DIAGNOSIS — Z833 Family history of diabetes mellitus: Secondary | ICD-10-CM | POA: Diagnosis not present

## 2018-06-01 DIAGNOSIS — I614 Nontraumatic intracerebral hemorrhage in cerebellum: Secondary | ICD-10-CM | POA: Insufficient documentation

## 2018-06-01 DIAGNOSIS — Z9889 Other specified postprocedural states: Secondary | ICD-10-CM | POA: Insufficient documentation

## 2018-06-01 DIAGNOSIS — Z9049 Acquired absence of other specified parts of digestive tract: Secondary | ICD-10-CM | POA: Diagnosis not present

## 2018-06-01 DIAGNOSIS — Z801 Family history of malignant neoplasm of trachea, bronchus and lung: Secondary | ICD-10-CM | POA: Insufficient documentation

## 2018-06-01 NOTE — Progress Notes (Signed)
Subjective:    Patient ID: Tiffany Thornton, female    DOB: Jan 12, 1933, 84 y.o.   MRN: [id number] 84 year old female with history of hypothyroid otherwise in good health; who was admitted on 04/30/18 with acute mental status changes with nausea, vomiting and vertigo.  History taken from chart review. She was noted to have hypertensive emergency with systolic blood pressure greater than 200 and CT of head done, reviewed, showing right cerebellar hemorrhage. Per report, large acute right cerebellar hemorrhage with  mass-effect on 4th ventricle and waxing an waning of symptoms. She was evaluated by Dr. Yetta BarreJones and taken to OR for suboccipital craniectomy and evacuation of hemorrhage.  Post op with mild dysarthria and right ataxia. 2 D echo showed EF 60-65% with grade 2 diastolic dysfunction and calcified mitral annulus with trivial regurgitation.    Dr. Roda ShuttersXu felt that bleed due to hypertensive emergency and SBP goals < 160.  MRI/MRA brain done revealing stable bleed with acute/subacute nonhemorrhagic infarct lateral to cerebellar hemorrhage, decreasing mass effect and no hydrocephalus and multiple intracranial aneurysms.  Scopolamine patch added to help with vertigo and mobility improving. She continues to have dizziness with activity as well as poor safety awareness. CIR recommended due to functional deficits  HPI 24/7 supervision from family HHPT, OT, RN Seen by PCP last week Patient has an appoint with neurosurgery next week Pt has not taken pain meds yet Down to Antivert BID Patient states that she is dressing herself and bathing herself using a walker to ambulate.  Her daughter states that this is correct. We discussed no driving. Pain Inventory Average Pain 0 Pain Right Now 0 My pain is no pain  In the last 24 hours, has pain interfered with the following? General activity no pain Relation with others no pain Enjoyment of life no pain What TIME of day is your pain at its worst? n/a Sleep (in  general) Good  Pain is worse with: no pain Pain improves with: no pain Relief from Meds: no pain  Mobility walk with assistance use a walker how many minutes can you walk? 10 ability to climb steps?  no do you drive?  no transfers alone  Function retired I need assistance with the following:  bathing, meal prep and household duties  Neuro/Psych weakness tremor trouble walking anxiety loss of taste or smell  Prior Studies Any changes since last visit?  no CT/MRI  Physicians involved in your care Primary care Dr. Manus GunningEhinger Neurosurgeon Dr. Dutch QuintPoole   Family History  Problem Relation Age of Onset  . Diabetes Mother   . Lung cancer Father   . Diabetes Sister    Social History   Socioeconomic History  . Marital status: Widowed    Spouse name: Not on file  . Number of children: Not on file  . Years of education: Not on file  . Highest education level: Not on file  Occupational History  . Not on file  Social Needs  . Financial resource strain: Not on file  . Food insecurity:    Worry: Not on file    Inability: Not on file  . Transportation needs:    Medical: Not on file    Non-medical: Not on file  Tobacco Use  . Smoking status: Former Smoker    Packs/day: 0.00    Years: 40.00    Pack years: 0.00    Types: Cigarettes    Start date: 05/07/1978    Last attempt to quit: 04/09/2018  Years since quitting: 0.1  . Smokeless tobacco: Current User  Substance and Sexual Activity  . Alcohol use: Never    Frequency: Never  . Drug use: Never  . Sexual activity: Not Currently    Partners: Male  Lifestyle  . Physical activity:    Days per week: Not on file    Minutes per session: Not on file  . Stress: Not on file  Relationships  . Social connections:    Talks on phone: Not on file    Gets together: Not on file    Attends religious service: Not on file    Active member of club or organization: Not on file    Attends meetings of clubs or organizations: Not on  file    Relationship status: Not on file  Other Topics Concern  . Not on file  Social History Narrative   Pt lives alone    Past Surgical History:  Procedure Laterality Date  . ABDOMINAL HYSTERECTOMY    . APPENDECTOMY    . CHOLECYSTECTOMY    . CRANIOTOMY N/A 04/30/2018   Procedure: CRANIOTOMY HEMATOMA EVACUATION SUBDURAL;  Surgeon: Julio Sicks, MD;  Location: [facility] OR;  Service: Neurosurgery;  Laterality: N/A;  suboccipital craniectomy for hematoma   Past Medical History:  Diagnosis Date  . Hyperlipidemia   . Hypothyroid    BP (!) 147/62   Pulse 72   Ht 5\' 6"  (1.676 m)   Wt 138 lb (62.6 kg)   SpO2 96%   BMI 22.27 kg/m   Opioid Risk Score:   Fall Risk Score:  `1  Depression screen PHQ 2/9  Depression screen PHQ 2/9 06/01/2018  Decreased Interest 0  Down, Depressed, Hopeless 0  PHQ - 2 Score 0    Review of Systems  Constitutional: Negative.   HENT: Negative.   Eyes: Negative.   Respiratory: Negative.   Cardiovascular: Negative.   Gastrointestinal: Positive for abdominal pain.  Endocrine: Negative.   Genitourinary: Negative.   Musculoskeletal: Negative.   Skin: Negative.   Allergic/Immunologic: Negative.   Neurological: Negative.   Hematological: Bruises/bleeds easily.  Psychiatric/Behavioral: Negative.   All other systems reviewed and are negative.      Objective:   Physical Exam  Constitutional: She appears well-developed and well-nourished. No distress.  HENT:  Head: Normocephalic and atraumatic.  Eyes: Pupils are equal, round, and reactive to light. EOM are normal.  Neck: Normal range of motion.  Cardiovascular: Normal rate, regular rhythm and normal heart sounds. Exam reveals no friction rub.  No murmur heard. Pulmonary/Chest: Effort normal and breath sounds normal. No stridor. No respiratory distress. She has no wheezes.  Abdominal: Soft. Bowel sounds are normal. She exhibits no distension.  Musculoskeletal:  No pain with upper limb or lower limb  range of motion.  Neurological:  Motor strength is 5/5 bilateral deltoid, biceps and triceps grip, hip flexor, knee extensor, ankle dorsiflexor There is dysmetria grade is mild to moderate in the right upper extremity finger-nose-finger testing and mild dysmetria right heel-to-shin testing normal on the left side. Gait is using a rolling walker no evidence of toe drag or knee instability she has a widened base of support She is able to stand unsupported with her feet together with her eyes open but not with her eyes closed.  Skin: She is not diaphoretic.  Psychiatric: Her mood appears anxious. Her speech is rapid and/or pressured.  Nursing note and vitals reviewed.         Assessment & Plan:  #1.  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 She has residual right hemiataxia and gait disorder.  Overall she has had a good recovery.  I would not recommend going back to driving at the current time or going back to mowing her lawn. Family is still providing 24/7 supervision which I think is appropriate at this time Recommend: Follow-up with neurosurgery has appointment next week PCP follow-up scheduled. No physical medicine rehab follow-up Complete home health therapy will likely need outpatient PT OT this can be ordered by her primary or through this office.

## 2018-06-05 ENCOUNTER — Other Ambulatory Visit: Payer: Self-pay

## 2018-06-05 NOTE — Patient Outreach (Signed)
[facility] Care Management  06/05/2018  Tiffany Thornton Apr 10, 1933 [id number]   EMMI: stroke red alert Referral date: 06/05/18 Referral reason: went to follow up appointment: no, Scheduled follow up appointment: NO Insurance: [employer] care Day # 13  Telephone call to patient regarding EMMI stroke red alert. HIPAA verified with patient. Explained reason for call. Patient states she has seen her primary MD, Dr. Manus GunningEhinger, the rehab doctor, Dr. Baxter HireKristen, and she will see the surgeon today, Dr. Jordan LikesPool.  Patient states she has a follow up with her primary MD the first of September 2019. Patient states she has transportation to her appointments.   Patient states she feels pretty good. Patient reports she is receiving home health for speech therapy.  Patient states she is has her medications and takes them as prescribed. Patient states she is on more medication now than she use to be. Patient states she is aware of what each of her medications are for.  Patient states she is slow on her right side and home health is working with her on her speech. Denies any other symptoms.  RNCM reviewed signs/ symptoms of stroke. Advised patient that 911 should be called for stroke like symptoms. Patient verbalized understanding.  RNCM advised patient to notify MD of any changes in condition prior to scheduled appointment.   PLAN: RNCM will close patient due to patient being assessed and having no further needs.   George InaDavina Vardaan Depascale RN,BSN,CCM [facility] Telephonic  [phone]

## 2018-06-12 ENCOUNTER — Telehealth: Payer: Self-pay | Admitting: *Deleted

## 2018-06-12 NOTE — Telephone Encounter (Signed)
Unice BaileyKatherine Fleihan, OT, [facility] left a message asking for [facility] verbal orders 2week3 followed by 1week1.  Medical record reviewed.  Verbal orders given per office protocol

## 2018-06-18 NOTE — Progress Notes (Signed)
[facility] [address] [place] [address] [phone]       OFFICE FOLLOW UP NOTE  [name] Date of Birth:  04-25-1933 Medical Record Number:  [id number]   Reason for Referral:  hospital ICH follow up  CHIEF COMPLAINT:  Chief Complaint  Patient presents with  . Follow-up    Hospital stroke follow up pt saw Dr. Pearlean BrownieSethi and Dr. Roda ShuttersXu in hospital room 9 pt with Tiffany Kidneyebra    HPI: Tiffany Thornton is being seen today for initial visit in the office for right cerebellar large ICH on 04/30/18. History obtained from patient and chart review. Reviewed all radiology images and labs personally.  Tiffany Thornton is a 84 y.o. female with no significant history who was admitted for  headache, confusion, vertigo and N/V.  CT scan reviewed and showed right cerebellar large ICH therefore no tPA given due to ICH.    Patient underwent emergent suboccipital craniectomy with evacuation of right cerebellar hematoma on 04/30/2018.  Patient tolerated procedure well without complications.  Repeat CT scan status post evacuation and mass-effect much improved.  Additional repeat CT scan showed stable right cerebellar edema without hydrocephalus.  MRI showed stable appearance of residual right cerebellar hemorrhage.  MRA of head showed multiple intracranial aneurysms but no posterior circulation aneurysms or vascular lesions were associated with recent hemorrhage.  2D echo showed an EF of 60 to 65%.  LDL 98 and A1c 5.8.  Patient was on aspirin 81 mg PTA and this was stopped due to ICH.  Etiology of his ICH most likely due to hypertension with some secondary ischemic injury as multiple intracranial aneurysms were not related to current ICH and risk of rupture is low due to their size but recommended to be monitored on a yearly basis.  Antihypertensives were managed during hospitalization to maintain SBP less than 160.  Due to continued dizziness as well as poor safety awareness CIR was recommended due  to functional deficits.  Patient was discharged home with recommendations of home PT/OT/ST on 05/21/2018.  Patient is being seen today for hospital follow-up and is accompanied by her daughter.  She states overall she is continuing to improve well with only residual right hand tremor and right foot drop but does continue PT/OT/ST at home.  Currently using rolling walker due to right foot drop and feeling off balance.  Has recently stopped taking meclizine after slowly weaning and denies dizziness since stopping.  Patient is currently living in her own home but does have family staying with her majority of the day for continued support, monitoring and safety.  Blood pressure at visit 151/63 but patient states she just took antihypertensives while waiting for appointment.  Patient did check this this morning and BP 137/62.  Patient was upset coming to this appointment as she did not understand reasoning behind this appointment as she has followed up with neurosurgery and primary care and states her primary care will continue to follow her for stroke risk modification and management.  Denies new or worsening stroke/TIA symptoms.   ROS:   14 system review of systems performed and negative with exception of walking difficulty  PMH:  Past Medical History:  Diagnosis Date  . Hyperlipidemia   . Hypothyroid   . Stroke [facility])     PSH:  Past Surgical History:  Procedure Laterality Date  . ABDOMINAL HYSTERECTOMY    . APPENDECTOMY    . CHOLECYSTECTOMY    . CRANIOTOMY N/A 04/30/2018  Procedure: CRANIOTOMY HEMATOMA EVACUATION SUBDURAL;  Surgeon: Julio Sicks, MD;  Location: [facility] OR;  Service: Neurosurgery;  Laterality: N/A;  suboccipital craniectomy for hematoma    Social History:  Social History   Socioeconomic History  . Marital status: Widowed    Spouse name: Not on file  . Number of children: Not on file  . Years of education: Not on file  . Highest education level: Not on file  Occupational History   . Not on file  Social Needs  . Financial resource strain: Not on file  . Food insecurity:    Worry: Not on file    Inability: Not on file  . Transportation needs:    Medical: Not on file    Non-medical: Not on file  Tobacco Use  . Smoking status: Current Some Day Smoker    Packs/day: 0.00    Years: 40.00    Pack years: 0.00    Types: Cigarettes    Start date: 05/07/1978  . Smokeless tobacco: Never Used  . Tobacco comment: 1 to 2 every other day  Substance and Sexual Activity  . Alcohol use: Never    Frequency: Never  . Drug use: Never  . Sexual activity: Not Currently    Partners: Male  Lifestyle  . Physical activity:    Days per week: Not on file    Minutes per session: Not on file  . Stress: Not on file  Relationships  . Social connections:    Talks on phone: Not on file    Gets together: Not on file    Attends religious service: Not on file    Active member of club or organization: Not on file    Attends meetings of clubs or organizations: Not on file    Relationship status: Not on file  . Intimate partner violence:    Fear of current or ex partner: Not on file    Emotionally abused: Not on file    Physically abused: Not on file    Forced sexual activity: Not on file  Other Topics Concern  . Not on file  Social History Narrative   Pt lives alone     Family History:  Family History  Problem Relation Age of Onset  . Diabetes Mother   . Lung cancer Father   . Diabetes Sister     Medications:   Current Outpatient Medications on File Prior to Visit  Medication Sig Dispense Refill  . acetaminophen (TYLENOL) 325 MG tablet Take 1-2 tablets (325-650 mg total) by mouth every 4 (four) hours as needed for mild pain.    Marland Kitchen amLODipine (NORVASC) 10 MG tablet Take 1 tablet (10 mg total) by mouth daily. 30 tablet 1  . hydrALAZINE (APRESOLINE) 25 MG tablet Take 1 tablet (25 mg total) by mouth every 8 (eight) hours. 90 tablet 1  . hydrochlorothiazide (MICROZIDE) 12.5 MG  capsule Take 1 capsule (12.5 mg total) by mouth daily. 30 capsule 1  . levothyroxine (SYNTHROID, LEVOTHROID) 100 MCG tablet Take 1 tablet (100 mcg total) by mouth daily before breakfast. 30 tablet 0  . lisinopril (PRINIVIL,ZESTRIL) 20 MG tablet Take 1 tablet (20 mg total) by mouth 2 (two) times daily. 60 tablet 1  . meclizine (ANTIVERT) 12.5 MG tablet Take 1 tablet (12.5 mg total) by mouth 3 (three) times daily. 90 tablet 1  . metoprolol tartrate (LOPRESSOR) 25 MG tablet Take 1 tablet (25 mg total) by mouth 2 (two) times daily. 60 tablet 1  . Multiple Vitamin (MULTIVITAMIN  WITH MINERALS) TABS tablet Take 1 tablet by mouth daily.     No current facility-administered medications on file prior to visit.     Allergies:  No Known Allergies   Physical Exam  Vitals:   06/19/18 0758  BP: (!) 151/63  Pulse: 68  Weight: 125 lb 9.6 oz (57 kg)  Height: 5\' 6"  (1.676 m)   Body mass index is 20.27 kg/m. No exam data present  General: well developed, well nourished, pleasant elderly Caucasian female, seated, in no evident distress Head: head normocephalic and atraumatic.   Neck: supple with no carotid or supraclavicular bruits Cardiovascular: regular rate and rhythm, no murmurs Musculoskeletal: no deformity Skin:  no rash/petichiae Vascular:  Normal pulses all extremities  Neurologic Exam Mental Status: Awake and fully alert. Oriented to place and time. Recent and remote memory intact. Attention span, concentration and fund of knowledge appropriate. Mood and affect appropriate.  Cranial Nerves: Fundoscopic exam reveals sharp disc margins. Pupils equal, briskly reactive to light. Extraocular movements full without nystagmus. Visual fields full to confrontation. Hearing intact. Facial sensation intact. Face, tongue, palate moves normally and symmetrically.  Motor: Normal bulk and tone. Normal strength in all tested extremity muscles except for weakness right ankle dorsiflexion Sensory.: intact to  touch , pinprick , position and vibratory sensation.  Coordination: Rapid alternating movements normal in all extremities. Finger-to-nose and heel-to-shin performed accurately bilaterally.  Mild tremors right upper extremity distally Gait and Station: Arises from chair without difficulty. Stance is normal. Gait demonstrates normal stride length and balance with assistance of rolling walker. Unable to heel, toe and tandem walk without difficulty due to right foot drop Reflexes: 1+ and symmetric. Toes downgoing.    NIHSS  0 Modified Rankin  2   Diagnostic Data (Labs, Imaging, Testing)  CT HEAD WO CONTRAST 04/30/18 IMPRESSION: 4 x 3.9 x 2.6 cm right cerebellar hemorrhage with surrounding vasogenic edema and local mass effect. Compression of the fourth ventricle. Patient at risk for development of hydrocephalus. Mild superior displacement of the vermis. Minimal inferior displacement right cerebellar tonsil.  Cerebellar hemorrhage may be related to hypertensive bleed. Underlying lesion not excluded.  Remote left thalamic infarct. Moderate chronic microvascular Changes.  CT HEAD WO CONTRAST (REPEAT) 05/01/18 IMPRESSION: Post right occipital craniectomy for evacuation of right cerebellar hematoma. Residual blood collection in the right cerebellum now measures 0.6 x 1.5 x 2.1 cm versus prior 4 x 3.9 x 2.6 cm. Small amount of gas within the resection cavity. Mild surrounding vasogenic edema. Decrease mass effect upon the fourth ventricle.  Scattered small areas of pneumocephalus bilaterally greatest frontal region without associated mass effect.  No new intracranial hemorrhage or CT evidence of large acute Infarct.  MR MRA HEAD WO CONTRAST MR BRAIN W WO CONTRAST 05/05/18 IMPRESSION: 1. Stable appearance of residual right cerebellar hemorrhage status post suboccipital craniotomy. 2. Acute/subacute nonhemorrhagic infarct lateral to the cerebellar hemorrhage. 3. There is edema  surrounding the area of infarct and hemorrhage with decreasing mass effect on the fourth ventricle and no hydrocephalus. 4. Multiple intracranial aneurysms. 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 A 4 mm anterior communicating artery aneurysm is present. 5. No posterior circulation aneurysms or vascular lesions associated with the recent hemorrhage. 6. Moderate white matter changes bilaterally likely reflect the sequela of chronic microvascular ischemia.  CT HEAD WO CONTRAST (REPEAT) 05/16/17 IMPRESSION: 1. Recent right cerebellar hemorrhage and  evacuation. No recurrent hemorrhage or mass effect. No hydrocephalus. 2. Stable suboccipital fluid collection.     ASSESSMENT: Tiffany Thornton is a 84 y.o. year old female here with right cerebellar ICH on 05/27/2018 secondary to hypertension. Vascular risk factors include HTN and HLD.     PLAN: -Start aspirin 81 mg daily as resolution of hemorrhage on CT scan on 05/16/2018 and for secondary stroke prevention -F/u with PCP regarding your HLD and HTN management -LDL elevated during hospital admission for patient want to speak with PCP in regards to starting statin for cholesterol management -continue to monitor BP at home -Continue home PT/OT/ST -Continue to follow with neurosurgery as scheduled -Yearly scans for routine monitoring of intracranial aneurysms - per patient PCP will monitor this  -Maintain strict control of hypertension with SBP less than 160, diabetes with hemoglobin A1c goal below 6.5% and cholesterol with LDL cholesterol (bad cholesterol) goal below 70 mg/dL. I also advised the patient to eat a healthy diet with plenty of whole grains, cereals, fruits and vegetables, exercise regularly and maintain ideal body weight.  Follow up as needed per patient request or call earlier if  needed   Greater than 50% of time during this 25 minute visit was spent on counseling,explanation of diagnosis of right cerebellar ICH, reviewing risk factor management of HLD and HTN, planning of further management, discussion with patient and family and coordination of care    George Hugh, [facility]  [facility] [address] [place], [place] [address]  Phone [phone] Fax [phone]

## 2018-06-19 ENCOUNTER — Encounter: Payer: Self-pay | Admitting: Adult Health

## 2018-06-19 ENCOUNTER — Ambulatory Visit: Payer: Medicare Other | Admitting: Adult Health

## 2018-06-19 VITALS — BP 151/63 | HR 68 | Ht 66.0 in | Wt 125.6 lb

## 2018-06-19 DIAGNOSIS — I614 Nontraumatic intracerebral hemorrhage in cerebellum: Secondary | ICD-10-CM | POA: Diagnosis not present

## 2018-06-19 DIAGNOSIS — E785 Hyperlipidemia, unspecified: Secondary | ICD-10-CM

## 2018-06-19 DIAGNOSIS — I10 Essential (primary) hypertension: Secondary | ICD-10-CM

## 2018-06-19 MED ORDER — ASPIRIN 81 MG PO TABS: 81.0000 mg | ORAL_TABLET | Freq: Every day | ORAL | 0 refills | Status: DC

## 2018-06-19 NOTE — Patient Instructions (Signed)
Start aspirin 81 mg daily for secondary stroke prevention  Continue to follow up with PCP regarding cholesterol and blood pressure management   Continue PT/OT/ST at home   Continue to monitor blood pressure at home  Maintain strict control of hypertension with blood pressure goal below 130/90, diabetes with hemoglobin A1c goal below 6.5% and cholesterol with LDL cholesterol (bad cholesterol) goal below 70 mg/dL. I also advised the patient to eat a healthy diet with plenty of whole grains, cereals, fruits and vegetables, exercise regularly and maintain ideal body weight.  Followup in the future with me as needed or call earlier if needed       Thank you for coming to see [facility] at [facility]. I hope we have been able to provide you high quality care today.  You may receive a patient satisfaction survey over the next few weeks. We would appreciate your feedback and comments so that we may continue to improve ourselves and the health of our patients.

## 2018-06-19 NOTE — Progress Notes (Signed)
I agree with the above plan 

## 2018-06-20 ENCOUNTER — Telehealth: Payer: Self-pay

## 2018-06-20 NOTE — Telephone Encounter (Signed)
Trey PaulaJeff, PT/[facility] called requesting extension 1wk4 to continue lower extremity strength, gait, balance training and fall prevention. Is this ok?

## 2018-06-21 NOTE — Telephone Encounter (Signed)
[facility]

## 2018-06-21 NOTE — Telephone Encounter (Signed)
Verified with Thurston HoleKeesha about home health request for extension, it looks like they are asking for 1 time a week for 4 weeks. Is all you want for this patient only 1 week thou?  Please advise.

## 2018-06-21 NOTE — Telephone Encounter (Signed)
Jeff notified 

## 2018-06-21 NOTE — Telephone Encounter (Signed)
OK to extend HH x 1 wk

## 2018-06-24 ENCOUNTER — Emergency Department (HOSPITAL_COMMUNITY)
Admission: EM | Admit: 2018-06-24 | Discharge: 2018-06-24 | Disposition: A | Discharge status: Home / Self Care | Payer: Medicare Other | Attending: Emergency Medicine | Admitting: Emergency Medicine

## 2018-06-24 ENCOUNTER — Encounter (HOSPITAL_COMMUNITY): Payer: Self-pay | Admitting: Emergency Medicine

## 2018-06-24 ENCOUNTER — Emergency Department (HOSPITAL_COMMUNITY): Payer: Medicare Other

## 2018-06-24 ENCOUNTER — Other Ambulatory Visit: Payer: Self-pay

## 2018-06-24 DIAGNOSIS — I10 Essential (primary) hypertension: Secondary | ICD-10-CM | POA: Insufficient documentation

## 2018-06-24 DIAGNOSIS — W01198A Fall on same level from slipping, tripping and stumbling with subsequent striking against other object, initial encounter: Secondary | ICD-10-CM | POA: Insufficient documentation

## 2018-06-24 DIAGNOSIS — Y92009 Unspecified place in unspecified non-institutional (private) residence as the place of occurrence of the external cause: Secondary | ICD-10-CM | POA: Diagnosis not present

## 2018-06-24 DIAGNOSIS — Z79899 Other long term (current) drug therapy: Secondary | ICD-10-CM | POA: Diagnosis not present

## 2018-06-24 DIAGNOSIS — Z7982 Long term (current) use of aspirin: Secondary | ICD-10-CM | POA: Diagnosis not present

## 2018-06-24 DIAGNOSIS — Y9389 Activity, other specified: Secondary | ICD-10-CM | POA: Insufficient documentation

## 2018-06-24 DIAGNOSIS — Y999 Unspecified external cause status: Secondary | ICD-10-CM | POA: Diagnosis not present

## 2018-06-24 DIAGNOSIS — E039 Hypothyroidism, unspecified: Secondary | ICD-10-CM | POA: Diagnosis not present

## 2018-06-24 DIAGNOSIS — S80812A Abrasion, left lower leg, initial encounter: Secondary | ICD-10-CM | POA: Insufficient documentation

## 2018-06-24 DIAGNOSIS — W19XXXA Unspecified fall, initial encounter: Secondary | ICD-10-CM

## 2018-06-24 DIAGNOSIS — T148XXA Other injury of unspecified body region, initial encounter: Secondary | ICD-10-CM

## 2018-06-24 DIAGNOSIS — F17210 Nicotine dependence, cigarettes, uncomplicated: Secondary | ICD-10-CM | POA: Diagnosis not present

## 2018-06-24 DIAGNOSIS — R270 Ataxia, unspecified: Secondary | ICD-10-CM | POA: Insufficient documentation

## 2018-06-24 DIAGNOSIS — S60944A Unspecified superficial injury of right ring finger, initial encounter: Secondary | ICD-10-CM | POA: Diagnosis present

## 2018-06-24 DIAGNOSIS — S80811A Abrasion, right lower leg, initial encounter: Secondary | ICD-10-CM | POA: Insufficient documentation

## 2018-06-24 DIAGNOSIS — S62664A Nondisplaced fracture of distal phalanx of right ring finger, initial encounter for closed fracture: Secondary | ICD-10-CM | POA: Diagnosis not present

## 2018-06-24 DIAGNOSIS — S62639A Displaced fracture of distal phalanx of unspecified finger, initial encounter for closed fracture: Secondary | ICD-10-CM

## 2018-06-24 DIAGNOSIS — Z8673 Personal history of transient ischemic attack (TIA), and cerebral infarction without residual deficits: Secondary | ICD-10-CM | POA: Insufficient documentation

## 2018-06-24 DIAGNOSIS — Z23 Encounter for immunization: Secondary | ICD-10-CM | POA: Insufficient documentation

## 2018-06-24 MED ORDER — CEPHALEXIN 250 MG PO CAPS
500.0000 mg | ORAL_CAPSULE | Freq: Once | ORAL | Status: AC
  Administered 2018-06-24: 500 mg via ORAL
  Filled 2018-06-24: qty 2

## 2018-06-24 MED ORDER — ACETAMINOPHEN 500 MG PO TABS
1000.0000 mg | ORAL_TABLET | Freq: Once | ORAL | Status: AC
  Administered 2018-06-24: 1000 mg via ORAL
  Filled 2018-06-24: qty 2

## 2018-06-24 MED ORDER — CEPHALEXIN 500 MG PO CAPS: 500.0000 mg | ORAL_CAPSULE | Freq: Three times a day (TID) | ORAL | 0 refills | Status: AC

## 2018-06-24 MED ORDER — ACETAMINOPHEN 500 MG PO TABS: 1000.0000 mg | ORAL_TABLET | Freq: Three times a day (TID) | ORAL | 0 refills | Status: AC

## 2018-06-24 MED ORDER — TETANUS-DIPHTH-ACELL PERTUSSIS 5-2.5-18.5 LF-MCG/0.5 IM SUSP
0.5000 mL | Freq: Once | INTRAMUSCULAR | Status: AC
  Administered 2018-06-24: 0.5 mL via INTRAMUSCULAR
  Filled 2018-06-24: qty 0.5

## 2018-06-24 NOTE — ED Notes (Signed)
Pt discharged from ED; instructions provided and scripts given; Pt encouraged to return to ED if symptoms worsen and to f/u with PCP; Pt verbalized understanding of all instructions 

## 2018-06-24 NOTE — ED Provider Notes (Signed)
[facility] EMERGENCY DEPARTMENT Provider Note  CSN: [id number] Arrival date & time: 06/24/18 0013  Chief Complaint(s) Extremity Laceration  HPI Tiffany Thornton is a 84 y.o. 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  Patient reports that while trying to get up to walk, she forgot to put both hands on her walker causing her to fall over.  Patient denied any head trauma, current headache.  She denies any neck pain.  No back pain, chest pain, abdominal pain, hip pain.  She is endorsing right ring finger pain.  She also reports that she has some skin tears in both shins.  She is endorsing bilateral lower extremity achiness.  Finger and lower extremity pain is exacerbated with palpation.  Relieved with immobility.  Patient reports that she was able to get up and ambulate after the incident.  Not up-to-date on tetanus vaccination.  HPI  Past Medical History Past Medical History:  Diagnosis Date  . Hyperlipidemia   . Hypothyroid   . Stroke [facility])    Patient Active Problem List   Diagnosis Date Noted  . Cerebellar hemorrhage, nontraumatic (HCC) 05/07/2018  . Ataxia, post-stroke   . Essential hypertension   . Prediabetes   . Hypokalemia   . Dizziness and giddiness   . Cognitive deficit, post-stroke   . Hypertensive emergency   . Hyperlipidemia   . Cerebellar edema (HCC)   . Leukocytosis   . S/P craniotomy 04/30/2018  . 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 Authorizing Provider  amLODipine (NORVASC) 10 MG tablet Take 1 tablet (10 mg total) by mouth daily. 05/22/18  Yes Angiulli, Mcarthur Rossetti, PA-C  hydrALAZINE (APRESOLINE) 25 MG tablet Take 1 tablet (25 mg total) by mouth  every 8 (eight) hours. 05/22/18  Yes Angiulli, Mcarthur Rossetti, PA-C  hydrochlorothiazide (MICROZIDE) 12.5 MG capsule Take 1 capsule (12.5 mg total) by mouth daily. 05/22/18  Yes Angiulli, Mcarthur Rossetti, PA-C  levothyroxine (SYNTHROID, LEVOTHROID) 100 MCG tablet Take 1 tablet (100 mcg total) by mouth daily before breakfast. 05/22/18  Yes Angiulli, Mcarthur Rossetti, PA-C  lisinopril (PRINIVIL,ZESTRIL) 20 MG tablet Take 1 tablet (20 mg total) by mouth 2 (two) times daily. 05/22/18  Yes Angiulli, Mcarthur Rossetti, PA-C  meclizine (ANTIVERT) 12.5 MG tablet Take 1 tablet (12.5 mg total) by mouth 3 (three) times daily. 05/22/18  Yes Angiulli, Mcarthur Rossetti, PA-C  metoprolol tartrate (LOPRESSOR) 25 MG tablet Take 1 tablet (25 mg total) by mouth 2 (two) times daily. 05/22/18  Yes Angiulli, Mcarthur Rossetti, PA-C  Multiple Vitamin (MULTIVITAMIN WITH MINERALS) TABS tablet Take 1 tablet by mouth daily.   Yes [provider]  acetaminophen (TYLENOL) 500 MG tablet Take 2 tablets (1,000 mg total) by mouth every 8 (eight) hours for 5 days. Do not take more than 4000 mg of acetaminophen (Tylenol) in a 24-hour period. Please note that other medicines that you may be prescribed may have Tylenol as well. 06/24/18 06/29/18  Karia Ehresman, Amadeo Garnet, MD  aspirin 81 MG tablet Take 1 tablet (81 mg total) by mouth daily. Patient not taking: Reported on 06/24/2018 06/19/18   George Hugh, NP  cephALEXin (KEFLEX) 500 MG capsule Take 1 capsule (500 mg total) by mouth 3 (three) times daily for 5 days. 06/24/18 06/29/18  Nira Conn, MD                                                                                                                                    Past Surgical History Past Surgical History:  Procedure Laterality Date  . ABDOMINAL HYSTERECTOMY    . APPENDECTOMY    . CHOLECYSTECTOMY    . CRANIOTOMY N/A 04/30/2018   Procedure: CRANIOTOMY HEMATOMA EVACUATION SUBDURAL;  Surgeon: Julio Sicks, MD;  Location: [facility] OR;  Service: Neurosurgery;   Laterality: N/A;  suboccipital craniectomy for hematoma   Family History Family History  Problem Relation Age of Onset  . Diabetes Mother   . Lung cancer Father   . Diabetes Sister     Social History Social History   Tobacco Use  . Smoking status: Current Some Day Smoker    Packs/day: 0.00    Years: 40.00    Pack years: 0.00    Types: Cigarettes    Start date: 05/07/1978  . Smokeless tobacco: Never Used  . Tobacco comment: 1 to 2 every other day  Substance Use Topics  . Alcohol use: Never    Frequency: Never  . Drug use: Never   Allergies Patient has no known allergies.  Review of Systems Review of Systems All other systems are reviewed and are negative for acute change except as noted in the HPI  Physical Exam Vital Signs  I have reviewed the triage vital signs BP (!) 164/69 (BP Location: Right Arm)   Pulse 76   Temp 98.1 F (36.7 C)   Resp 17   Ht 5\' 6"  (1.676 m)   Wt 56.7 kg (125 lb)   SpO2 100%   BMI 20.18 kg/m   Physical Exam  Constitutional: She is oriented to person, place, and time. She appears well-developed and well-nourished. No distress.  HENT:  Head: Normocephalic and atraumatic.  Right Ear: External ear normal.  Left Ear: External ear normal.  Nose: Nose normal.  Eyes: Pupils are equal, round, and reactive to light. Conjunctivae and EOM are normal. Right eye exhibits no discharge. Left eye exhibits no discharge. No scleral icterus.  Neck: Normal range of motion. Neck supple.  Cardiovascular: Normal rate, regular rhythm and normal heart sounds. Exam reveals no gallop and no friction rub.  No murmur heard. Pulses:      Radial pulses are 2+ on the right side, and 2+ on the left side.       Dorsalis pedis pulses are 2+ on the right side, and 2+ on the left side.  Pulmonary/Chest: Effort normal and breath sounds normal. No stridor. No respiratory distress. She has no wheezes.  Abdominal: Soft. She exhibits no distension. There is no tenderness.    Musculoskeletal: She exhibits no edema.       Cervical back: She exhibits no bony tenderness.       Thoracic back: She exhibits no bony tenderness.  Lumbar back: She exhibits no bony tenderness.       Hands:      Right lower leg: She exhibits tenderness. She exhibits no swelling, no edema and no deformity.       Left lower leg: She exhibits tenderness. She exhibits no bony tenderness, no swelling and no deformity.       Legs: Clavicles stable. Chest stable to AP/Lat compression. Pelvis stable to Lat compression. No obvious extremity deformity. No chest or abdominal wall contusion.  Neurological: She is alert and oriented to person, place, and time.  Moving all extremities  Skin: Skin is warm and dry. No rash noted. She is not diaphoretic. No erythema.  Psychiatric: She has a normal mood and affect.    ED Results and Treatments Labs (all labs ordered are listed, but only abnormal results are displayed) Labs Reviewed - No data to display                                                                                                                       EKG  EKG Interpretation  Date/Time:    Ventricular Rate:    PR Interval:    QRS Duration:   QT Interval:    QTC Calculation:   R Axis:     Text Interpretation:        Radiology Dg Tibia/fibula Left  Result Date: 06/24/2018 CLINICAL DATA:  Fall using walker tonight, skin tear to both lower extremities. EXAM: LEFT TIBIA AND FIBULA - 2 VIEW COMPARISON:  None. FINDINGS: Cortical margins of the tibia and fibula are intact. No acute fracture. Knee and ankle alignment is maintained. Minimal soft tissue edema about the distal lateral leg. There are vascular calcifications. IMPRESSION: Soft tissue edema.  No fracture or acute osseous abnormality. Electronically Signed   By: Rubye OaksMelanie  Ehinger M.D.   On: 06/24/2018 03:49   Dg Tibia/fibula Right  Result Date: 06/24/2018 CLINICAL DATA:  Fall using walker tonight, skin tear to both  lower extremities. EXAM: RIGHT TIBIA AND FIBULA - 2 VIEW COMPARISON:  Radiographs 01/12/2016 FINDINGS: Cortical margins of the tibia and fibula are intact. No acute fracture. Knee and ankle alignment is maintained. Skin irregularity noted about the lateral mid calf with mild soft tissue edema. There are vascular calcifications. IMPRESSION: Soft tissue edema and skin irregularity about the lateral mid calf. No fracture or acute bony abnormality. Electronically Signed   By: Rubye OaksMelanie  Ehinger M.D.   On: 06/24/2018 03:49   Dg Finger Ring Right  Result Date: 06/24/2018 CLINICAL DATA:  Larey SeatFell from [place].  Laceration. EXAM: RIGHT RING FINGER 2+V COMPARISON:  None. FINDINGS: Small fracture fragment dorsum of the middle phalanx soft tissues distally. Donor site base of distal phalanx. Severe PIP and mild DIP osteoarthrosis. Osteopenia without destructive bony lesions. Soft tissue swelling without subcutaneous gas or radiopaque foreign bodies. IMPRESSION: 1. Acute avulsion fracture distal phalanx.  No dislocation. 2. Severe PIP osteoarthrosis. Electronically Signed   By: Awilda Metroourtnay  Bloomer M.D.   On:  06/24/2018 01:19   Pertinent labs & imaging results that were available during my care of the patient were reviewed by me and considered in my medical decision making (see chart for details).  Medications Ordered in ED Medications  acetaminophen (TYLENOL) tablet 1,000 mg (1,000 mg Oral Given 06/24/18 0350)  Tdap (BOOSTRIX) injection 0.5 mL (0.5 mLs Intramuscular Given 06/24/18 0350)  cephALEXin (KEFLEX) capsule 500 mg (500 mg Oral Given 06/24/18 0429)                                                                                                                                    Procedures Procedures  (including critical care time)  Medical Decision Making / ED Course I have reviewed the nursing notes for this encounter and the patient's prior records (if available in EHR or on provided paperwork).  Clinical  Course as of Jun 24 529  Sun Jun 24, 2018  0350 Injury to the right ring finger.  Plain film notable for a avulsed fracture.  No displacement.  Finger splint applied.  Skin tears are hemostatic.  They were thoroughly irrigated and bandaged.  Tetanus vaccination updated.  Plain films of bilateral lower extremities negative for any acute fractures.  Given the adipose tissue involvement of the right lower extremity skin tear, will provide prophylactic antibiotics.    [PC]    Clinical Course User Index [PC] Lizet Kelso, Amadeo Garnet, MD     Final Clinical Impression(s) / ED Diagnoses Final diagnoses:  Fall  Closed avulsion fracture of distal phalanx of finger, initial encounter  Multiple skin tears   Disposition: Discharge  Condition: Good  I have discussed the results, Dx and Tx plan with the patient and family who expressed understanding and agree(s) with the plan. Discharge instructions discussed at great length. The patient and family was given strict return precautions who verbalized understanding of the instructions. No further questions at time of discharge.    ED Discharge Orders        Ordered    cephALEXin (KEFLEX) 500 MG capsule  3 times daily     06/24/18 0530    acetaminophen (TYLENOL) 500 MG tablet  Every 8 hours     06/24/18 0530       Follow Up: Blair Heys, MD [address] [place] [address] [phone]  Schedule an appointment as soon as possible for a visit  As needed      This chart was dictated using voice recognition software.  Despite best efforts to proofread,  errors can occur which can change the documentation meaning.   Nira Conn, MD 06/24/18 0530

## 2018-06-24 NOTE — ED Triage Notes (Signed)
Pt forgot to put both hands on her walker tonight and fell over. Skin tears to both shins. Pt also had bruised right ring finger.

## 2018-06-24 NOTE — ED Notes (Signed)
Soiled EMS dressings removed from bilat LEs. Skin tears covered with non-adherent bandages for MD assessment.

## 2018-06-24 NOTE — Discharge Instructions (Signed)
Continue to wear this finger splint for 2 weeks.  Follow-up with your primary care provider within those 2 weeks.

## 2018-07-11 ENCOUNTER — Telehealth: Payer: Self-pay | Admitting: Physical Medicine & Rehabilitation

## 2018-07-11 NOTE — Telephone Encounter (Signed)
Trey PaulaJeff therapist with [facility] needs to get an order to send out a Child psychotherapistocial Worker to home.  Family is requesting one to help with possibly placement at an Assisted Living Facility.  Please call Trey PaulaJeff at [phone].

## 2018-07-12 NOTE — Telephone Encounter (Signed)
Clinic note reviewed, Social work note reviewed, verbal orders given per office protocol

## 2018-09-28 ENCOUNTER — Encounter: Payer: Self-pay | Admitting: Adult Health

## 2018-09-28 ENCOUNTER — Ambulatory Visit: Payer: Medicare Other | Admitting: Adult Health

## 2018-09-28 VITALS — BP 140/67 | HR 52 | Ht 66.0 in | Wt 135.0 lb

## 2018-09-28 DIAGNOSIS — I10 Essential (primary) hypertension: Secondary | ICD-10-CM | POA: Diagnosis not present

## 2018-09-28 DIAGNOSIS — I614 Nontraumatic intracerebral hemorrhage in cerebellum: Secondary | ICD-10-CM | POA: Diagnosis not present

## 2018-09-28 DIAGNOSIS — E785 Hyperlipidemia, unspecified: Secondary | ICD-10-CM | POA: Diagnosis not present

## 2018-09-28 NOTE — Patient Instructions (Signed)
Continue aspirin 81 mg daily for secondary stroke prevention  Continue to follow up with PCP regarding cholesterol and blood pressure management   Trial wrist weight 1-3 lb on right wrist to see if this helps with tremors. If not after 2 weeks, please call and we can consider starting medication management at that time.   Continue therapy at this time for continued weakness  Continue to monitor blood pressure at home  Maintain strict control of hypertension with blood pressure goal below 130/90, diabetes with hemoglobin A1c goal below 6.5% and cholesterol with LDL cholesterol (bad cholesterol) goal below 70 mg/dL. I also advised the patient to eat a healthy diet with plenty of whole grains, cereals, fruits and vegetables, exercise regularly and maintain ideal body weight.  Followup in the future with me in 3 months or call earlier if needed       Thank you for coming to see [place] at [facility]. I hope we have been able to provide you high quality care today.  You may receive a patient satisfaction survey over the next few weeks. We would appreciate your feedback and comments so that we may continue to improve ourselves and the health of our patients.

## 2018-09-28 NOTE — Progress Notes (Signed)
[facility] [address] [place]. [place] [address] [phone]       OFFICE FOLLOW UP NOTE  Ms. [name] Date of Birth:  Apr 05, 1933 Medical Record Number:  [id number]   Reason for Referral:  hospital ICH follow up  CHIEF COMPLAINT:  Chief Complaint  Patient presents with  . 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 History obtained from patient and chart review. Reviewed all radiology images and labs personally.  Ms. [name] is a 84 y.o. female with no significant history who was admitted for  headache, confusion, vertigo and N/V.  CT scan reviewed and showed right cerebellar large ICH therefore no tPA given due to ICH.    Patient underwent emergent suboccipital craniectomy with evacuation of right cerebellar hematoma on 04/30/2018.  Patient tolerated procedure well without complications.  Repeat CT scan status post evacuation and mass-effect much improved.  Additional repeat CT scan showed stable right cerebellar edema without hydrocephalus.  MRI showed stable appearance of residual right cerebellar hemorrhage.  MRA of head showed multiple intracranial aneurysms but no posterior circulation aneurysms or vascular lesions were associated with recent hemorrhage.  2D echo showed an EF of 60 to 65%.  LDL 98 and A1c 5.8.  Patient was on aspirin 81 mg PTA and this was stopped due to ICH.  Etiology of his ICH most likely due to hypertension with some secondary ischemic injury as multiple intracranial aneurysms were not related to current ICH and risk of rupture is low due to their size but recommended to be monitored on a yearly basis.  Antihypertensives were managed during hospitalization to maintain SBP less than 160.  Due to continued dizziness as well as poor safety awareness CIR was recommended due to  functional deficits.  Patient was discharged home with recommendations of home PT/OT/ST on 05/21/2018.  06/19/2018 visit: Patient is being seen today for hospital follow-up and is accompanied by her daughter.  She states overall she is continuing to improve well with only residual right hand tremor and right foot drop but does continue PT/OT/ST at home.  Currently using rolling walker due to right foot drop and feeling off balance.  Has recently stopped taking meclizine after slowly weaning and denies dizziness since stopping.  Patient is currently living in her own home but does have family staying with her majority of the day for continued support, monitoring and safety.  Blood pressure at visit 151/63 but patient states she just took antihypertensives while waiting for appointment.  Patient did check this this morning and BP 137/62.  Patient was upset coming to this appointment as she did not understand reasoning behind this appointment as she has followed up with neurosurgery and primary care and states her primary care will continue to follow her for stroke risk modification and management.  Denies new or worsening stroke/TIA symptoms.  Interval history 09/28/2018: Patient returns today per patient request due to complaints of right hand tremor and is accompanied by her son.  In 06/2018, patient was admitted to [facility] assisted living where she is currently residing due to frequent falls.  Per inpatient neurology notes, patient was noted to have right-sided ataxia along with prior visit assessment showing right hand tremor.  Patient is unable to state whether her tremors have worsened but  her son believes that they have.  As she is right-hand dominant, patient is unable to write, use an eating utensil or do any type of activity with her right hand.  She is currently receiving PT to assist with gait and balance along with continued right foot drop.  She denies any other worsening or new neurological symptoms.   She states her primary doctor sent her here to ensure that this cannot be related to Parkinson's.  She is noted to have increased tremor with action and only mild tremoring at rest.  When she holds her right upper extremity towards her body, tremors will subside.  Also noted to have slight tremoring right face.  When patient becomes distracted, tremor at rest will subside.  She has continued to take aspirin with mild bruising but no bleeding.  Blood pressure today 140/67.  Since prior visit, patient was evaluated in the ED on 06/24/2018 due to a fall.  X-ray was obtained of right hand due to complaints of right ring finger pain with x-ray showing a avulsed fracture and finger splint applied.  Denies hitting her head upon fall therefore CT scan was not obtained.  Patient was discharged home in stable condition.    ROS:   14 system review of systems performed and negative with exception of walking difficulty  PMH:  Past Medical History:  Diagnosis Date  . Hyperlipidemia   . Hypothyroid   . Stroke [facility])     PSH:  Past Surgical History:  Procedure Laterality Date  . ABDOMINAL HYSTERECTOMY    . APPENDECTOMY    . CHOLECYSTECTOMY    . CRANIOTOMY N/A 04/30/2018   Procedure: CRANIOTOMY HEMATOMA EVACUATION SUBDURAL;  Surgeon: Julio Sicks, MD;  Location: [facility] OR;  Service: Neurosurgery;  Laterality: N/A;  suboccipital craniectomy for hematoma    Social History:  Social History   Socioeconomic History  . Marital status: Widowed    Spouse name: Not on file  . Number of children: Not on file  . Years of education: Not on file  . Highest education level: Not on file  Occupational History  . Not on file  Social Needs  . Financial resource strain: Not on file  . Food insecurity:    Worry: Not on file    Inability: Not on file  . Transportation needs:    Medical: Not on file    Non-medical: Not on file  Tobacco Use  . Smoking status: Current Some Day Smoker    Packs/day: 0.00    Years:  40.00    Pack years: 0.00    Types: Cigarettes    Start date: 05/07/1978  . Smokeless tobacco: Never Used  . Tobacco comment: 1 to 2 every other day  Substance and Sexual Activity  . Alcohol use: Never    Frequency: Never  . Drug use: Never  . Sexual activity: Not Currently    Partners: Male  Lifestyle  . Physical activity:    Days per week: Not on file    Minutes per session: Not on file  . Stress: Not on file  Relationships  . Social connections:    Talks on phone: Not on file    Gets together: Not on file    Attends religious service: Not on file    Active member of club or organization: Not on file    Attends meetings of clubs or organizations: Not on file    Relationship status: Not on file  . Intimate partner violence:  Fear of current or ex partner: Not on file    Emotionally abused: Not on file    Physically abused: Not on file    Forced sexual activity: Not on file  Other Topics Concern  . Not on file  Social History Narrative   Pt lives alone     Family History:  Family History  Problem Relation Age of Onset  . Diabetes Mother   . Lung cancer Father   . Diabetes Sister     Medications:   Current Outpatient Medications on File Prior to Visit  Medication Sig Dispense Refill  . amLODipine (NORVASC) 10 MG tablet Take 1 tablet (10 mg total) by mouth daily. 30 tablet 1  . aspirin 81 MG tablet Take 1 tablet (81 mg total) by mouth daily. 30 tablet 0  . hydrALAZINE (APRESOLINE) 25 MG tablet Take 1 tablet (25 mg total) by mouth every 8 (eight) hours. 90 tablet 1  . hydrochlorothiazide (MICROZIDE) 12.5 MG capsule Take 1 capsule (12.5 mg total) by mouth daily. 30 capsule 1  . levothyroxine (SYNTHROID, LEVOTHROID) 100 MCG tablet Take 1 tablet (100 mcg total) by mouth daily before breakfast. 30 tablet 0  . lisinopril (PRINIVIL,ZESTRIL) 20 MG tablet Take 1 tablet (20 mg total) by mouth 2 (two) times daily. 60 tablet 1  . meclizine (ANTIVERT) 12.5 MG tablet Take 1  tablet (12.5 mg total) by mouth 3 (three) times daily. 90 tablet 1  . metoprolol tartrate (LOPRESSOR) 25 MG tablet Take 1 tablet (25 mg total) by mouth 2 (two) times daily. 60 tablet 1  . Multiple Vitamin (MULTIVITAMIN WITH MINERALS) TABS tablet Take 1 tablet by mouth daily.    . rosuvastatin (CRESTOR) 10 MG tablet Take 10 mg by mouth daily.     No current facility-administered medications on file prior to visit.     Allergies:  No Known Allergies   Physical Exam  Vitals:   09/28/18 0912  BP: 140/67  Pulse: (!) 52  Weight: 135 lb (61.2 kg)  Height: 5\' 6"  (1.676 m)   Body mass index is 21.79 kg/m. No exam data present  General: well developed, well nourished, pleasant elderly Caucasian female, seated, in no evident distress Head: head normocephalic and atraumatic.   Neck: supple with no carotid or supraclavicular bruits Cardiovascular: regular rate and rhythm, no murmurs Musculoskeletal: no deformity Skin:  no rash/petichiae Vascular:  Normal pulses all extremities  Neurologic Exam Mental Status: Awake and fully alert. Oriented to place and time. Recent and remote memory intact. Attention span, concentration and fund of knowledge appropriate. Mood and affect appropriate.  Cranial Nerves: Fundoscopic exam reveals sharp disc margins. Pupils equal, briskly reactive to light. Extraocular movements full without nystagmus. Visual fields full to confrontation. Hearing intact. Facial sensation intact. Face, tongue, palate moves normally and symmetrically.  Motor: Normal bulk and tone. Normal strength in all tested extremity muscles except for weakness right ankle dorsiflexion Sensory.: intact to touch , pinprick , position and vibratory sensation.  Coordination: Rapid alternating movements normal in all extremities. Finger-to-nose and heel-to-shin performed accurately bilaterally.  Moderate action tremors right upper extremity distally with mild resting tremors.  Negative for cogwheel  rigidity. Gait and Station: Arises from chair without difficulty. Stance is normal. Gait demonstrates normal stride length and balance with assistance of rolling walker. Unable to heel, toe and tandem walk without difficulty due to right foot drop Reflexes: 1+ and symmetric. Toes downgoing.     Diagnostic Data (Labs, Imaging, Testing)  CT HEAD WO  CONTRAST 04/30/18 IMPRESSION: 4 x 3.9 x 2.6 cm right cerebellar hemorrhage with surrounding vasogenic edema and local mass effect. Compression of the fourth ventricle. Patient at risk for development of hydrocephalus. Mild superior displacement of the vermis. Minimal inferior displacement right cerebellar tonsil.  Cerebellar hemorrhage may be related to hypertensive bleed. Underlying lesion not excluded.  Remote left thalamic infarct. Moderate chronic microvascular Changes.  CT HEAD WO CONTRAST (REPEAT) 05/01/18 IMPRESSION: Post right occipital craniectomy for evacuation of right cerebellar hematoma. Residual blood collection in the right cerebellum now measures 0.6 x 1.5 x 2.1 cm versus prior 4 x 3.9 x 2.6 cm. Small amount of gas within the resection cavity. Mild surrounding vasogenic edema. Decrease mass effect upon the fourth ventricle.  Scattered small areas of pneumocephalus bilaterally greatest frontal region without associated mass effect.  No new intracranial hemorrhage or CT evidence of large acute Infarct.  MR MRA HEAD WO CONTRAST MR BRAIN W WO CONTRAST 05/05/18 IMPRESSION: 1. Stable appearance of residual right cerebellar hemorrhage status post suboccipital craniotomy. 2. Acute/subacute nonhemorrhagic infarct lateral to the cerebellar hemorrhage. 3. There is edema surrounding the area of infarct and hemorrhage with decreasing mass effect on the fourth ventricle and no hydrocephalus. 4. Multiple intracranial aneurysms. Two separate 3 mm left paraophthalmic artery aneurysms are present, 1 directed medially and 1  directed superiorly. 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 A 4 mm anterior communicating artery aneurysm is present. 5. No posterior circulation aneurysms or vascular lesions associated with the recent hemorrhage. 6. Moderate white matter changes bilaterally likely reflect the sequela of chronic microvascular ischemia.  CT HEAD WO CONTRAST (REPEAT) 05/16/17 IMPRESSION: 1. Recent right cerebellar hemorrhage and evacuation. No recurrent hemorrhage or mass effect. No hydrocephalus. 2. Stable suboccipital fluid collection.     ASSESSMENT: Tiffany Thornton is a 84 y.o. year old female here with right cerebellar ICH on 05/27/2018 secondary to hypertension. Vascular risk factors include HTN and HLD.  Patient returns today per patient request due to worsening right hand tremor.    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  No evidence of Parkinson's.  Recommended trialing wrist weights and attempts to minimize tremor.  If tremor does not resolve, recommended to call office and we can consider starting medication management at that time -Advised to continue to therapy for continued deficits -continue to monitor BP at home -Yearly scans for routine monitoring of intracranial aneurysms - per patient PCP will monitor this  -Maintain strict control of hypertension with SBP less than 160, diabetes with hemoglobin A1c goal below 6.5% and cholesterol with LDL cholesterol (bad cholesterol) goal below 70 mg/dL. I also advised the patient to eat a healthy diet with plenty of whole grains, cereals, fruits and vegetables, exercise regularly and maintain ideal body weight.  Follow up in 3 months or call earlier if needed   Greater than 50% of time during this 25 minute visit was spent on counseling,explanation of diagnosis of right cerebellar ICH,  reviewing risk factor management of HLD and HTN, planning of further management, discussion with patient and family and coordination of care    George Hugh, [facility]  [facility] [address] [place], [place] [address]  Phone [phone] Fax [phone]

## 2018-09-30 NOTE — Progress Notes (Signed)
I agree with the above plan 

## 2018-10-11 ENCOUNTER — Other Ambulatory Visit: Payer: Self-pay

## 2018-10-11 ENCOUNTER — Telehealth: Payer: Self-pay | Admitting: Adult Health

## 2018-10-11 MED ORDER — PROPRANOLOL HCL 20 MG PO TABS: 20.0000 mg | ORAL_TABLET | Freq: Two times a day (BID) | ORAL | 3 refills | Status: DC

## 2018-10-11 NOTE — Telephone Encounter (Signed)
RN receive a call from pts son [name] on dpr. [facility].The son states his mom [facility] her [facility]. RN stated Shanda BumpsJessica NP recommend propranolol 40mg  bid.She wants the son /pt to know that It can lower the blood pressure. RN stated she needs to monitor her blood pressure at home. Rn also explain to son if his mom is unable to tolerate or has not gain any benefit after two weeks to call the office. The dose can be increase or a change can be made in the medication. The son verbalized understanding.

## 2018-10-11 NOTE — Telephone Encounter (Signed)
Left vm for patients son [name] to call back to discuss Shanda BumpsJessica NP recommendations.Rn also left vm that we need to know which pharmacy pt uses for her medications.

## 2018-10-11 NOTE — Telephone Encounter (Signed)
Due to continued cerebellar tremors despite use of wrist weights, recommend to initiate propranolol 40 mg twice daily.  This will be called into pharmacy -please ask patient which pharmacy she would like this to be sent to as no pharmacy currently listed.    Please advise patient that with initiating this medication for her tremors, it can lower her blood pressure therefore advised her to continue to monitor at home.  Please advise her that if she is unable to tolerate or has not gain benefit after 2 weeks, to call office and we can consider increasing dose at that time or change in medication management.

## 2018-10-11 NOTE — Telephone Encounter (Signed)
[facility] to ask which pharmacy services their facility. They stated all p[facility]. Rn [facility].

## 2018-10-11 NOTE — Telephone Encounter (Addendum)
Rn Therapist, occupationalcall [name] at [facility].Kriste BasqueBecky stated [facility]. RN call [phone] and it was primary doctors office not the correct pharmacy.

## 2018-10-11 NOTE — Telephone Encounter (Signed)
Pts son ([name] on [place]) call stating the weight on the pt arms has not helped her tremors. Stating they tried the weights for 2 weeks requesting a cal to discuss the next step

## 2018-10-11 NOTE — Telephone Encounter (Signed)
[name] @[facility] [place] - , [place] [address] - [address] She has called stating pt's on stating pt's propranolol should have been sent to [facility]([phone]), son told Kriste BasqueBecky that [facility] packages pt's medication

## 2018-10-11 NOTE — Telephone Encounter (Signed)
Order placed for propranolol 20 mg twice daily for cerebellar tremors

## 2018-10-11 NOTE — Addendum Note (Signed)
Addended by: George HughVANSCHAICK, JESSICA on: 10/11/2018 12:49 PM   Modules accepted: Orders

## 2018-11-06 ENCOUNTER — Telehealth: Payer: Self-pay | Admitting: Adult Health

## 2018-11-06 NOTE — Telephone Encounter (Signed)
Pt son(on DPR-Mcdiarmid,[name] [phone]) has called to inform the medication has been put on for her shaking has not helped and he is asking for a call to discuss having pt taken off that medication

## 2018-11-06 NOTE — Telephone Encounter (Signed)
Left detail message on son [name] number. The vm stated its his confidential vm. Rn left vm that [facility] nursing staff will have to call us about any concerns or taking pt off medication.All orders or concerns of medication has to be deferred to them. Rn left [facility] number for son to have [facility] us a call if medication is not working.

## 2018-11-06 NOTE — Telephone Encounter (Signed)
[facility] called stating the pts shaking hasn't gotten better is willing to take pt off the medication they will just need an order faxed to [phone] telling them to do so.

## 2018-11-07 NOTE — Telephone Encounter (Signed)
Rn call [facility] at [phone]. Rn ask for pts nurse at facility. Raeanne GathersJacey RN stated pt told her she has tremors only when she is reaching for a cup or any object. When she is not reaching for anything the tremors are not occurring. Pt stated its not a constant tremor. Rn stated the son wanted pt off the medication. Message will be sent to [facility].

## 2018-11-07 NOTE — Telephone Encounter (Signed)
If her blood pressures have been stable, we can consider increasing propranolol dosage if patient and son are in agreement to this.  We could also trial other medication such as gabapentin and Topamax in the future if needed.  Please contact facility to assess blood pressure readings and if they have been stable, we will attempt to increase propranolol first if son and patient are in agreement to this.

## 2018-11-07 NOTE — Telephone Encounter (Signed)
Rn call Tiffany Thornton nurse for patient. Rn stated Tiffany BumpsJessica Thornton wanted to know how her blood pressure readings are. Tiffany Thornton stated its taken once a month. The last two readings were 135 to 140 systolic. Tiffany Thornton stated the son did state if it can be increase they would continue trying it. Rn stated message will be sent to [facility]. [facility] from [facility] given to Tiffany Thornton.

## 2018-11-09 ENCOUNTER — Telehealth: Payer: Self-pay

## 2018-11-09 ENCOUNTER — Other Ambulatory Visit: Payer: Self-pay | Admitting: Adult Health

## 2018-11-09 MED ORDER — PROPRANOLOL HCL 40 MG PO TABS: 40.0000 mg | ORAL_TABLET | Freq: Two times a day (BID) | ORAL | 3 refills | Status: DC

## 2018-11-09 NOTE — Progress Notes (Signed)
Increase propranolol dosage due to continued tremors.  [facility].

## 2018-11-09 NOTE — Telephone Encounter (Signed)
New orders for Propr[facility]. Confirmation fax has been received.

## 2018-11-12 NOTE — Telephone Encounter (Signed)
Inderal change to 40mg  bid.

## 2018-11-12 NOTE — Telephone Encounter (Signed)
Manuela SchwartzDuff, Brittany M, CMA       11/09/18 11:19 AM  Note    New orders for Pro[facility]. Confirmation fax has been received.

## 2018-12-27 ENCOUNTER — Telehealth: Payer: Self-pay | Admitting: Adult Health

## 2018-12-27 NOTE — Telephone Encounter (Signed)
I return [name] patients  son. He stated pt is getting discharge from [facility] living on Saturday. [name] stated his mom reported that her shaking is not get better while being on the inderal. Pts dosage was increase in 10/2018 by Shanda Bumps NP. He cancel the appt with Shanda Bumps NP on 01/04/2019 because she will be seeing her PCP. I recommend he speak with the MD, NP and nursing staff concerning the medication before discharge. The pts son verbalized understanding.

## 2018-12-27 NOTE — Telephone Encounter (Signed)
Pt son(on DPR-Litzenberger,[name]) has called to inform that he was told to call after pt took new medication for shaking.  Son feels pt is shaking more and he wants her taken off .  Son also stated pt wants to see her family doctor and has requested f./u for 02-07 be cancelled.

## 2018-12-28 NOTE — Telephone Encounter (Signed)
Noted! Thank you

## 2019-01-04 ENCOUNTER — Ambulatory Visit: Payer: Medicare Other | Admitting: Adult Health

## 2019-01-08 ENCOUNTER — Telehealth: Payer: Self-pay | Admitting: Adult Health

## 2019-01-08 NOTE — Telephone Encounter (Addendum)
I called patients son [name] about the inderal being discontinue. I stated per Shanda Bumps NP to decrease inderal to one pill 20mg  per day, and than discontinue completely.THe son verbalized understanding.

## 2019-01-08 NOTE — Telephone Encounter (Signed)
Revised. 

## 2019-01-08 NOTE — Telephone Encounter (Signed)
Patients son [name] on [place] calling to let Shanda Bumps know that propranolol (INDERAL) 40 MG tablet is not helping the shaking in her right hand & arm. He feels the shaking is getting worse. Needs advise on stopping her medication. Please call him at [phone]

## 2019-01-08 NOTE — Telephone Encounter (Signed)
Please advise him that he can decrease down to 20 mg daily for 1 week and then discontinue completely thereafter.

## 2019-09-28 IMAGING — CT CT HEAD W/O CM
4 series · 16 of 47 positions shown, 18 images · non-contrast
Comparison: None.

CLINICAL DATA: 85-year-old female with altered mental status.
Initial encounter.

EXAM:
CT HEAD WITHOUT CONTRAST
TECHNIQUE: Contiguous axial images were obtained from the base of the skull
through the vertex without intravenous contrast.

[Series 3: head without · axial · non-contrast · 0.39mm/px · z∈[+1192,+1312]mm · 7 of 32 slices shown, 9 images]
[im 4/32  brain]
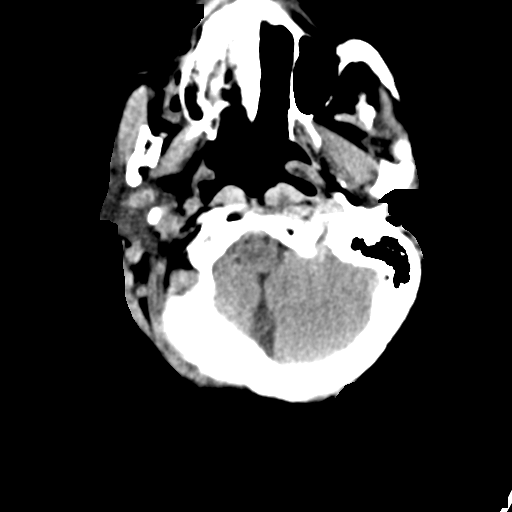
[im 4/32  bone]
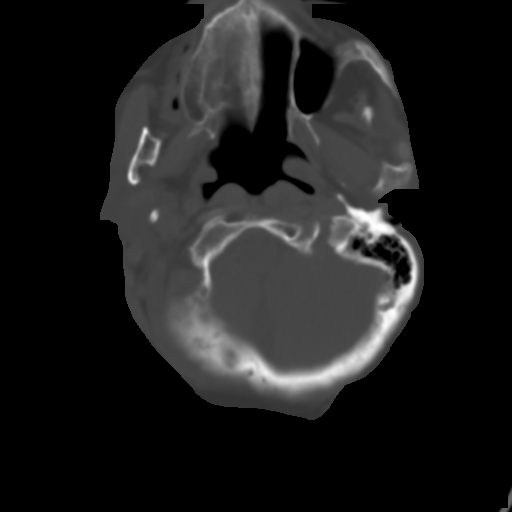
[im 8/32  brain]
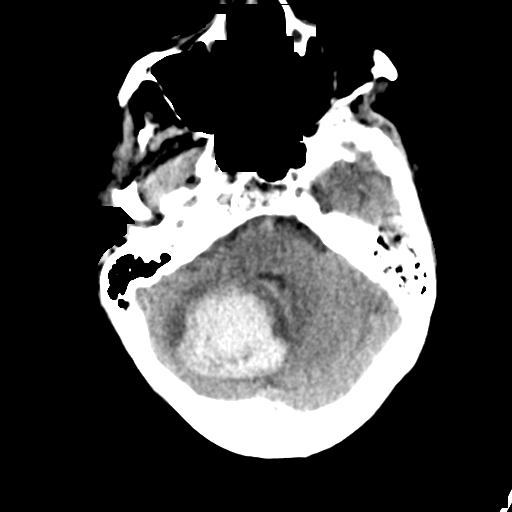
[im 12/32  brain]
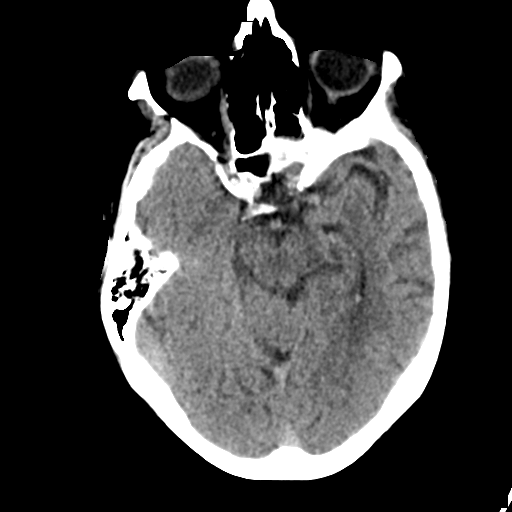
[im 16/32  brain]
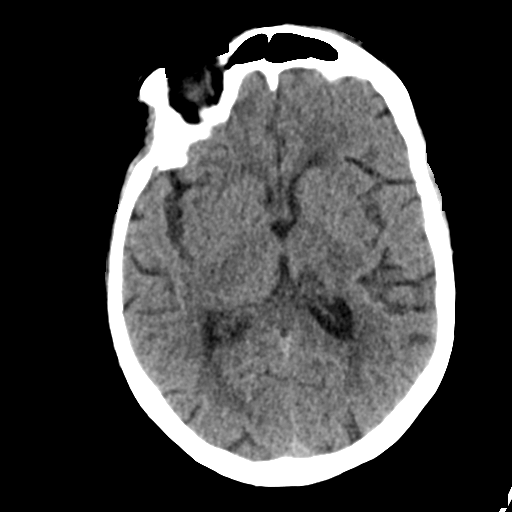
[im 20/32  brain]
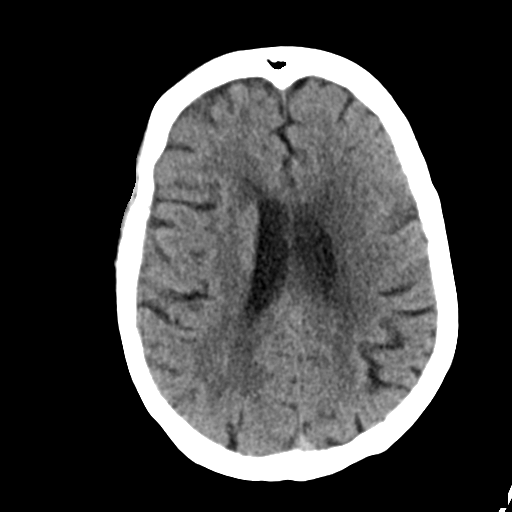
[im 20/32  bone]
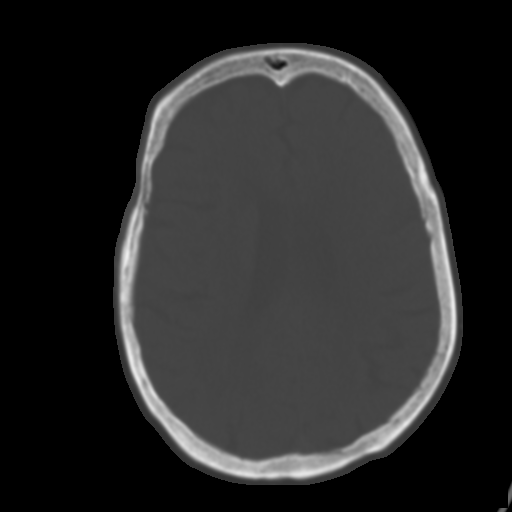
[im 24/32  brain]
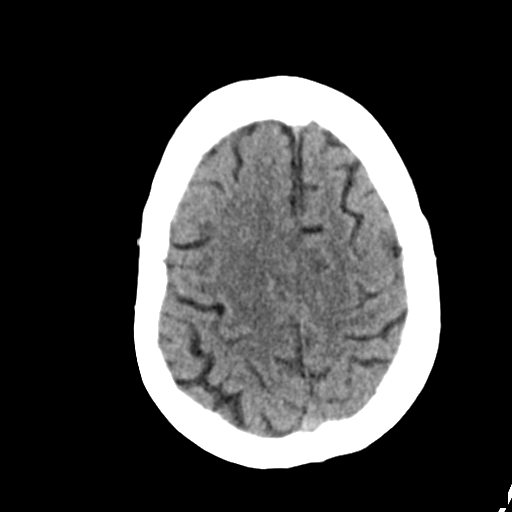
[im 28/32  brain]
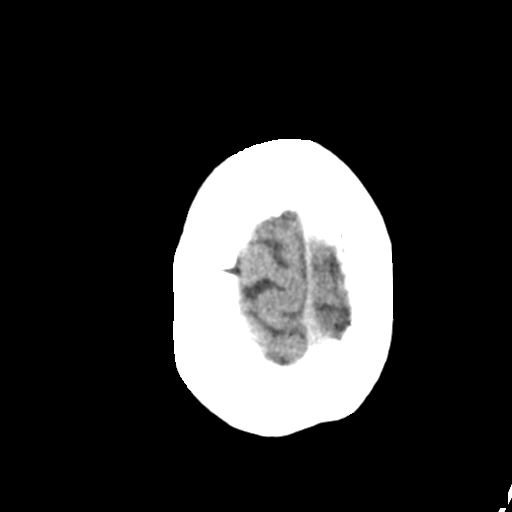

[Series 4: head bone · axial · 0.39mm/px · z∈[+1191,+1223]mm · 3 of 79 slices shown]
[im 8/79  bone]
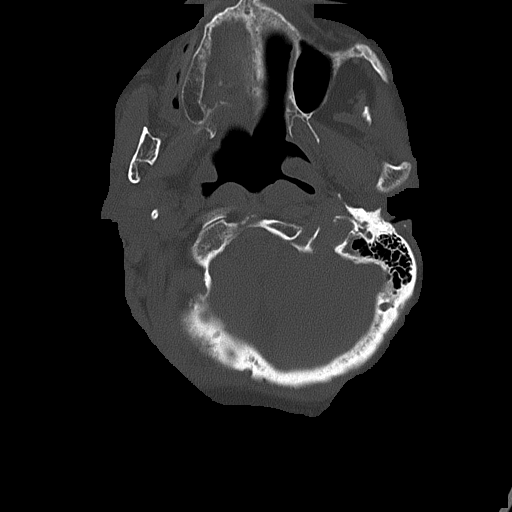
[im 16/79  bone]
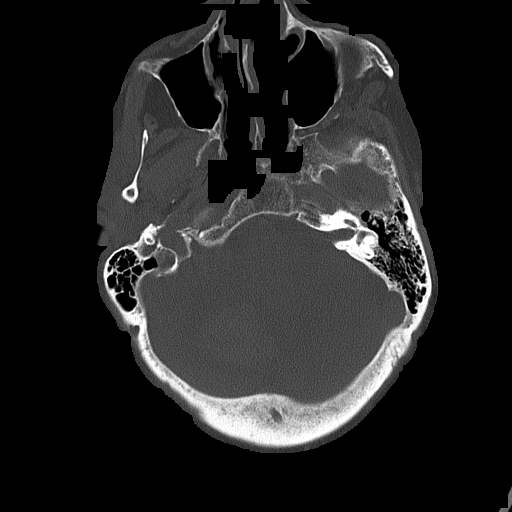
[im 24/79  bone]
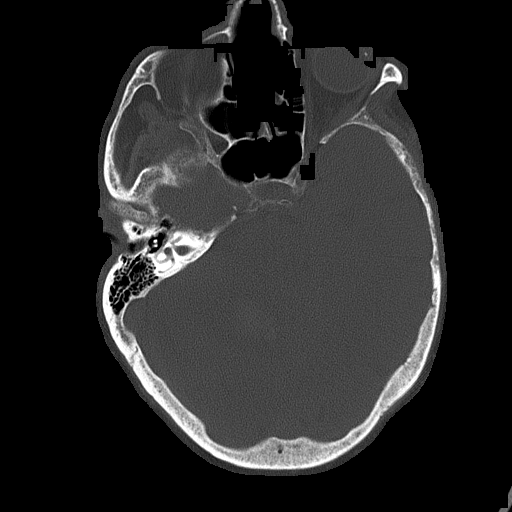

[Series 5: head without cor · coronal · non-contrast · 0.31mm/px · 3 of 67 slices shown]
[im 23/67  brain]
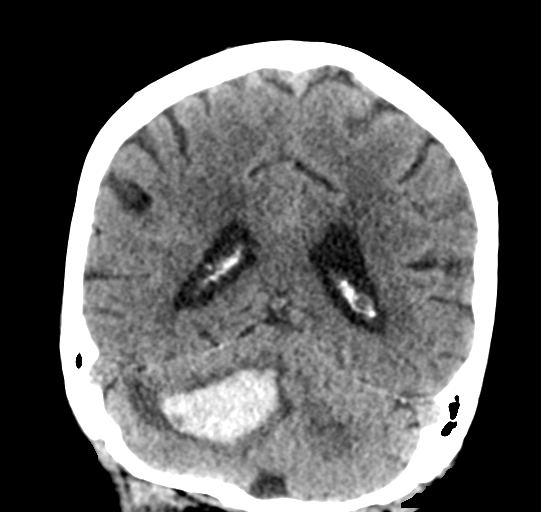
[im 30/67  brain]
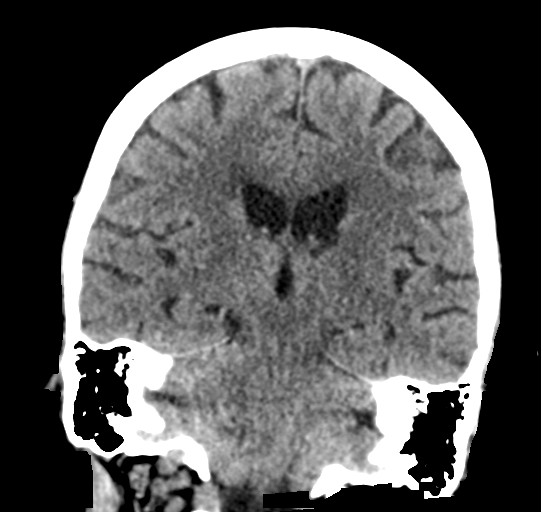
[im 37/67  brain]
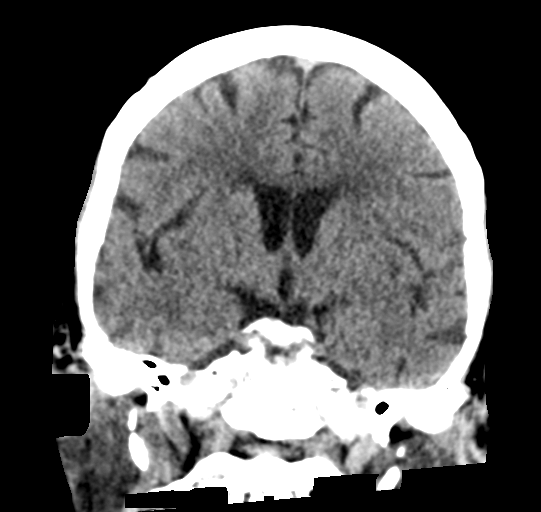

[Series 6: head without sag · sagittal · non-contrast · 0.31mm/px · 3 of 65 slices shown]
[im 25/65  brain]
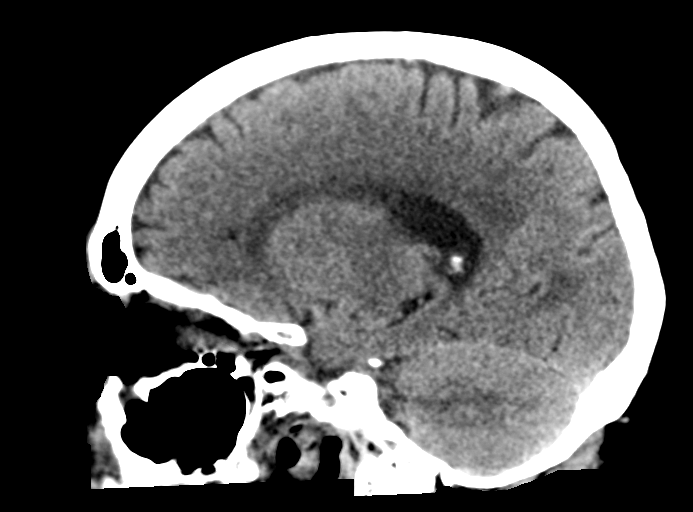
[im 33/65  brain]
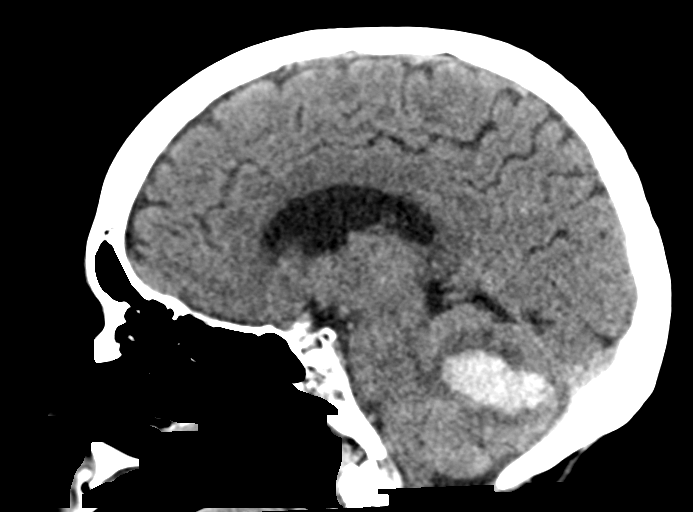
[im 41/65  brain]
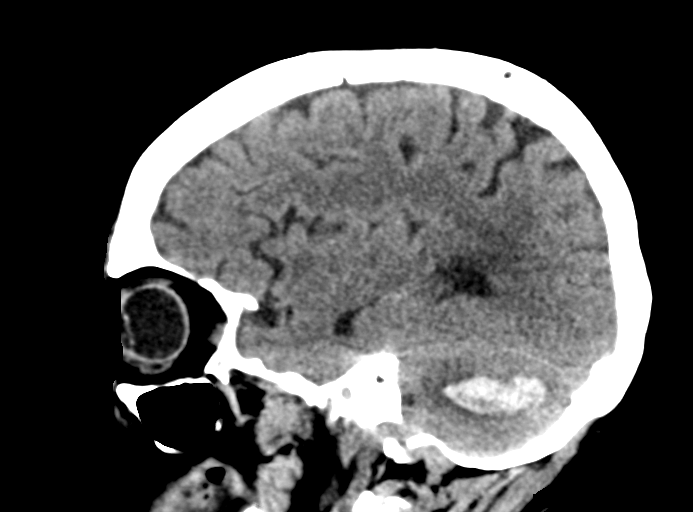

[16 of 47 positions shown; findings below may reference images not displayed]

FINDINGS: Brain: 4 x 3.9 x 2.6 cm right cerebellar hemorrhage with surrounding
vasogenic edema and local mass effect. Compression of the fourth
ventricle. Patient at risk for development of hydrocephalus. Mild
superior displacement of the vermis. Minimal inferior displacement
right cerebellar tonsil.

No other intracranial mass lesion seen on present unenhanced head
CT.

Remote left thalamic infarct. Moderate chronic microvascular
changes. Mild atrophy.

Vascular: Vascular calcifications.

Skull: No skull fracture.

Sinuses/Orbits: No acute orbital abnormality. Visualized paranasal
sinuses are clear.

Other: Mastoid air cells and middle ear cavities are clear.
IMPRESSION: 4 x 3.9 x 2.6 cm right cerebellar hemorrhage with surrounding
vasogenic edema and local mass effect. Compression of the fourth
ventricle. Patient at risk for development of hydrocephalus. Mild
superior displacement of the vermis. Minimal inferior displacement
right cerebellar tonsil.

Cerebellar hemorrhage may be related to hypertensive bleed.
Underlying lesion not excluded.

Remote left thalamic infarct. Moderate chronic microvascular
changes.

These results were called by telephone at the time of interpretation
on 04/30/2018 at [DATE] to Dr. Sol who verbally acknowledged
these results.

## 2020-04-07 ENCOUNTER — Encounter: Payer: Self-pay | Admitting: Adult Health

## 2020-04-07 ENCOUNTER — Ambulatory Visit: Payer: Medicare Other | Admitting: Adult Health

## 2020-04-07 ENCOUNTER — Other Ambulatory Visit: Payer: Self-pay

## 2020-04-07 VITALS — BP 115/54 | HR 46 | Temp 97.1°F | Ht 66.0 in | Wt 114.0 lb

## 2020-04-07 DIAGNOSIS — R260 Ataxic gait: Secondary | ICD-10-CM

## 2020-04-07 DIAGNOSIS — G252 Other specified forms of tremor: Secondary | ICD-10-CM

## 2020-04-07 DIAGNOSIS — I614 Nontraumatic intracerebral hemorrhage in cerebellum: Secondary | ICD-10-CM | POA: Diagnosis not present

## 2020-04-07 DIAGNOSIS — I10 Essential (primary) hypertension: Secondary | ICD-10-CM

## 2020-04-07 DIAGNOSIS — E785 Hyperlipidemia, unspecified: Secondary | ICD-10-CM

## 2020-04-07 NOTE — Progress Notes (Signed)
[facility] [address] [place]. [place] [address] [phone]       OFFICE FOLLOW UP NOTE  [name] Date of Birth:  1933/10/09 Medical Record Number:  [id number]   Reason for Referral: Cerebellar ICH follow up  CHIEF COMPLAINT:  Chief Complaint  Patient presents with  . Follow-up    tremors, hx of stroke, rm 9, with son, here to discuss tremors     HPI:   Today, 04/07/2020, Tiffany Thornton is being seen by request due to worsening tremors accompanied by her son.  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  At prior visit, discussion regarding likely cerebellar tremors post stroke vs essential tremor.  Attempted propranolol but per son, tremors worsened therefore discontinued.  Since prior visit over 1 year ago, RUE tremors have been slowly progressing and interfere with daily activity.  Occasional gait difficulties but ambulates within her home without assistive device and will use rolling walker outdoors.  Denies recent falls.  PCP recently initiated citalopram last week due to increased anxiety.  Denies new stroke/TIA symptoms.  Continues on aspirin 81 mg daily and Crestor 10 mg daily for secondary stroke prevention.  Blood pressure today 115/54.  She is on multiple antihypertensives including metoprolol, lisinopril, hydrochlorothiazide, hydralazine and amlodipine.  No further concerns at this time.    History provided for reference purposes only Update 09/28/2018 JM: Patient returns today per patient request due to complaints of right hand tremor and is accompanied by her son.  In 06/2018, patient was admitted to [facility] assisted living where she is currently residing due to frequent falls.  Per inpatient neurology notes, patient was noted to have right-sided ataxia along with prior visit assessment showing right hand tremor.  Patient is unable to state whether her tremors have worsened but her son  believes that they have.  As she is right-hand dominant, patient is unable to write, use an eating utensil or do any type of activity with her right hand.  She is currently receiving PT to assist with gait and balance along with continued right foot drop.  She denies any other worsening or new neurological symptoms.  She states her primary doctor sent her here to ensure that this cannot be related to Parkinson's.  She is noted to have increased tremor with action and only mild tremoring at rest.  When she holds her right upper extremity towards her body, tremors will subside.  Also noted to have slight tremoring right face.  When patient becomes distracted, tremor at rest will subside.  She has continued to take aspirin with mild bruising but no bleeding.  Blood pressure today 140/67.  Since prior visit, patient was evaluated in the ED on 06/24/2018 due to a fall.  X-ray was obtained of right hand due to complaints of right ring finger pain with x-ray showing a avulsed fracture and finger splint applied.  Denies hitting her head upon fall therefore CT scan was not obtained.  Patient was discharged home in stable condition.   06/19/2018 visit JM: Patient is being seen today for hospital follow-up and is accompanied by her daughter.  She states overall she is continuing to improve well with only residual right hand tremor and right foot drop but does continue PT/OT/ST at home.  Currently using rolling walker due to right foot drop and feeling off balance.  Has recently stopped taking meclizine after slowly weaning and denies dizziness since stopping.  Patient  is currently living in her own home but does have family staying with her majority of the day for continued support, monitoring and safety.  Blood pressure at visit 151/63 but patient states she just took antihypertensives while waiting for appointment.  Patient did check this this morning and BP 137/62.  Patient was upset coming to this appointment as she did  not understand reasoning behind this appointment as she has followed up with neurosurgery and primary care and states her primary care will continue to follow her for stroke risk modification and management.  Denies new or worsening stroke/TIA symptoms.  Stroke admission 04/30/2018: Tiffany Thornton is a 86 y.o. female with no significant history who was admitted for  headache, confusion, vertigo and N/V.  CT scan reviewed and showed right cerebellar large ICH therefore no tPA given due to ICH.    Patient underwent emergent suboccipital craniectomy with evacuation of right cerebellar hematoma on 04/30/2018.  Patient tolerated procedure well without complications.  Repeat CT scan status post evacuation and mass-effect much improved.  Additional repeat CT scan showed stable right cerebellar edema without hydrocephalus.  MRI showed stable appearance of residual right cerebellar hemorrhage.  MRA of head showed multiple intracranial aneurysms but no posterior circulation aneurysms or vascular lesions were associated with recent hemorrhage.  2D echo showed an EF of 60 to 65%.  LDL 98 and A1c 5.8.  Patient was on aspirin 81 mg PTA and this was stopped due to ICH.  Etiology of his ICH most likely due to hypertension with some secondary ischemic injury as multiple intracranial aneurysms were not related to current ICH and risk of rupture is low due to their size but recommended to be monitored on a yearly basis.  Antihypertensives were managed during hospitalization to maintain SBP less than 160.  Due to continued dizziness as well as poor safety awareness CIR was recommended due to functional deficits.  Patient was discharged home with recommendations of home PT/OT/ST on 05/21/2018.    ROS:   14 system review of systems performed and negative with exception of tremors  PMH:  Past Medical History:  Diagnosis Date  . Hyperlipidemia   . Hypothyroid   . Stroke [facility])     PSH:  Past Surgical History:  Procedure  Laterality Date  . ABDOMINAL HYSTERECTOMY    . APPENDECTOMY    . CHOLECYSTECTOMY    . CRANIOTOMY N/A 04/30/2018   Procedure: CRANIOTOMY HEMATOMA EVACUATION SUBDURAL;  Surgeon: Julio Sicks, MD;  Location: [facility] OR;  Service: Neurosurgery;  Laterality: N/A;  suboccipital craniectomy for hematoma    Social History:  Social History   Socioeconomic History  . Marital status: Widowed    Spouse name: Not on file  . Number of children: Not on file  . Years of education: Not on file  . Highest education level: Not on file  Occupational History  . Not on file  Tobacco Use  . Smoking status: Current Some Day Smoker    Packs/day: 0.00    Years: 40.00    Pack years: 0.00    Types: Cigarettes    Start date: 05/07/1978  . Smokeless tobacco: Never Used  . Tobacco comment: 1 to 2 every other day  Substance and Sexual Activity  . Alcohol use: Never  . Drug use: Never  . Sexual activity: Not Currently    Partners: Male  Other Topics Concern  . Not on file  Social History Narrative   Pt lives alone    Social  Determinants of Health   Financial Resource Strain:   . Difficulty of Paying Living Expenses:   Food Insecurity:   . Worried About Programme researcher, broadcasting/film/video in the Last Year:   . Barista in the Last Year:   Transportation Needs:   . Freight forwarder (Medical):   Marland Kitchen Lack of Transportation (Non-Medical):   Physical Activity:   . Days of Exercise per Week:   . Minutes of Exercise per Session:   Stress:   . Feeling of Stress :   Social Connections:   . Frequency of Communication with Friends and Family:   . Frequency of Social Gatherings with Friends and Family:   . Attends Religious Services:   . Active Member of Clubs or Organizations:   . Attends Banker Meetings:   Marland Kitchen Marital Status:   Intimate Partner Violence:   . Fear of Current or Ex-Partner:   . Emotionally Abused:   Marland Kitchen Physically Abused:   . Sexually Abused:     Family History:  Family History    Problem Relation Age of Onset  . Diabetes Mother   . Lung cancer Father   . Diabetes Sister     Medications:   Current Outpatient Medications on File Prior to Visit  Medication Sig Dispense Refill  . amLODipine (NORVASC) 10 MG tablet Take 1 tablet (10 mg total) by mouth daily. 30 tablet 1  . aspirin 81 MG tablet Take 1 tablet (81 mg total) by mouth daily. 30 tablet 0  . citalopram (CELEXA) 20 MG tablet Take 20 mg by mouth daily.    . hydrALAZINE (APRESOLINE) 25 MG tablet Take 1 tablet (25 mg total) by mouth every 8 (eight) hours. 90 tablet 1  . hydrochlorothiazide (MICROZIDE) 12.5 MG capsule Take 1 capsule (12.5 mg total) by mouth daily. 30 capsule 1  . levothyroxine (SYNTHROID, LEVOTHROID) 100 MCG tablet Take 1 tablet (100 mcg total) by mouth daily before breakfast. 30 tablet 0  . lisinopril (PRINIVIL,ZESTRIL) 20 MG tablet Take 1 tablet (20 mg total) by mouth 2 (two) times daily. 60 tablet 1  . metoprolol tartrate (LOPRESSOR) 25 MG tablet Take 1 tablet (25 mg total) by mouth 2 (two) times daily. 60 tablet 1  . Multiple Vitamin (MULTIVITAMIN WITH MINERALS) TABS tablet Take 1 tablet by mouth daily.    . rosuvastatin (CRESTOR) 10 MG tablet Take 10 mg by mouth daily.     No current facility-administered medications on file prior to visit.    Allergies:  No Known Allergies   Physical Exam  Vitals:   04/07/20 0733  BP: (!) 115/54  Pulse: (!) 46  Temp: (!) 97.1 F (36.2 C)  Weight: 114 lb (51.7 kg)  Height: 5\' 6"  (1.676 m)   Body mass index is 18.4 kg/m. No exam data present  General: well developed, well nourished, pleasant elderly Caucasian female, seated, in no evident distress Head: head normocephalic and atraumatic.   Neck: supple with no carotid or supraclavicular bruits Cardiovascular: regular rate and rhythm, no murmurs Musculoskeletal: no deformity Skin:  no rash/petichiae Vascular:  Normal pulses all extremities  Neurologic Exam Mental Status: Awake and fully  alert.  Vocal ataxia.  Oriented to place and time. Recent and remote memory intact. Attention span, concentration and fund of knowledge appropriate. Mood and affect mildly anxious but appropriate for situation.  Cranial Nerves: Pupils equal, briskly reactive to light. Extraocular movements full without nystagmus. Visual fields full to confrontation. Hearing intact. Facial sensation intact.  Face, tongue, palate moves normally and symmetrically.  Motor: Normal bulk and tone. Normal strength in all tested extremity muscles Sensory.: intact to touch , pinprick , position and vibratory sensation.  Coordination: Rapid alternating movements normal in all extremities. Finger-to-nose RUE moderate ataxia and heel-to-shin mild ataxia.  Moderate intention and postural RUE tremor Gait and Station: Arises from chair without difficulty. Stance is normal. Gait demonstrates ataxic type gait with assistance of rolling walker.  Reflexes: 1+ and symmetric. Toes downgoing.         ASSESSMENT: Tiffany Thornton is a 86 y.o. year old female here with right cerebellar ICH on 05/27/2018 secondary to hypertension. Vascular risk factors include HTN and HLD.  Patient returns today per PCP request due to progressive RUE tremor which has been present since cerebellar stroke.  Physical exam consistent with cerebellar ataxia and tremors post cerebellar ICH    PLAN: -Discussion regarding unfortunately lack of treatment options in regards to likely cerebellar tremors.  May potentially trial low-dose gabapentin or Topamax but advised unsure of benefit and possible side effects.  Provide additional information in which patient will review and call office if interested in trialing.  Referral placed to home health PT/OT for hopeful assistance/compensation strategies as tremors interfere with ADLs as well as cerebellar ataxia -continue aspirin 81 mg daily for secondary stroke prevention -F/u with PCP regarding your HLD and HTN management    -continue to monitor BP at home -Maintain strict control of hypertension with SBP less than 160, diabetes with hemoglobin A1c goal below 6.5% and cholesterol with LDL cholesterol (bad cholesterol) goal below 70 mg/dL. I also advised the patient to eat a healthy diet with plenty of whole grains, cereals, fruits and vegetables, exercise regularly and maintain ideal body weight.   Follow up in 4 months or call earlier if needed   I spent 40 minutes of face-to-face and non-face-to-face time with patient and son.  This included previsit chart review, lab review, study review, order entry, electronic health record documentation, patient education regarding likely cerebellar tremor post cerebellar ICH, importance of managing stroke risk factors and answered all questions to patient and son satisfaction    Frann Rider, [facility]  [facility] [address] [place], [place] [address]  Phone [phone] Fax [phone]

## 2020-04-07 NOTE — Patient Instructions (Addendum)
Your Plan:  You may trial use of gabapentin or topamax as these medications may help with the tremor. You tremors are more likely cerebellar tremors vs essential tremors - at times, medications may not help.   Would recommend doing occupational therapy as they can help with compensation strategies and being able to function more with your right arm   Call office if you would like to proceed with doing therapy either in the home or coming to office or if you would like to trial use of medication   Follow up in 4 months or call earlier if needed       Thank you for [facility]. I hope we have been able to provide you high quality care today.  You may receive a patient satisfaction survey over the next few weeks. We would appreciate your feedback and comments so that we may continue to improve ourselves and the health of our patients.   Topiramate tablets What is this medicine? TOPIRAMATE (toe PYRE a mate) is used to treat seizures in adults or children with epilepsy. It is also used for the prevention of migraine headaches. This medicine may be used for other purposes; ask your health care provider or pharmacist if you have questions. COMMON BRAND NAME(S): Topamax, Topiragen What should I tell my health care provider before I take this medicine? They need to know if you have any of these conditions:  bleeding disorders  kidney disease  lung or breathing disease, like asthma  suicidal thoughts, plans, or attempt; a previous suicide attempt by you or a family member  an unusual or allergic reaction to topiramate, other medicines, foods, dyes, or preservatives  pregnant or trying to get pregnant  breast-feeding How should I use this medicine? Take this medicine by mouth with a glass of water. Follow the directions on the prescription label. Do not cut, crush or chew this medicine. Swallow the tablets whole. You can take it with or without  food. If it upsets your stomach, take it with food. Take your medicine at regular intervals. Do not take it more often than directed. Do not stop taking except on your doctor's advice. A special MedGuide will be given to you by the pharmacist with each prescription and refill. Be sure to read this information carefully each time. Talk to your pediatrician regarding the use of this medicine in children. While this drug may be prescribed for children as young as 412 years of age for selected conditions, precautions do apply. Overdosage: If you think you have taken too much of this medicine contact a poison control center or emergency room at once. NOTE: This medicine is only for you. Do not share this medicine with others. What if I miss a dose? If you miss a dose, take it as soon as you can. If your next dose is to be taken in less than 6 hours, then do not take the missed dose. Take the next dose at your regular time. Do not take double or extra doses. What may interact with this medicine? This medicine may interact with the following medications:  acetazolamide  alcohol  antihistamines for allergy, cough, and cold  aspirin and aspirin-like medicines  atropine  birth control pills  certain medicines for anxiety or sleep  certain medicines for bladder problems like oxybutynin, tolterodine  certain medicines for depression like amitriptyline, fluoxetine, sertraline  certain medicines for seizures like carbamazepine, phenobarbital, phenytoin, primidone, valproic acid, zonisamide  certain  medicines for stomach problems like dicyclomine, hyoscyamine  certain medicines for travel sickness like scopolamine  certain medicines for Parkinson's disease like benztropine, trihexyphenidyl  certain medicines that treat or prevent blood clots like warfarin, enoxaparin, dalteparin, apixaban, dabigatran, and rivaroxaban  digoxin  general anesthetics like halothane, isoflurane, methoxyflurane,  propofol  hydrochlorothiazide  ipratropium  lithium  medicines that relax muscles for surgery  metformin  narcotic medicines for pain  NSAIDs, medicines for pain and inflammation, like ibuprofen or naproxen  phenothiazines like chlorpromazine, mesoridazine, prochlorperazine, thioridazine  pioglitazone This list may not describe all possible interactions. Give your health care provider a list of all the medicines, herbs, non-prescription drugs, or dietary supplements you use. Also tell them if you smoke, drink alcohol, or use illegal drugs. Some items may interact with your medicine. What should I watch for while using this medicine? Visit your doctor or health care professional for regular checks on your progress. Tell your health care professional if your symptoms do not start to get better or if they get worse. Do not stop taking except on your health care professional's advice. You may develop a severe reaction. Your health care professional will tell you how much medicine to take. Wear a medical ID bracelet or chain. Carry a card that describes your disease and details of your medicine and dosage times. This medicine can reduce the response of your body to heat or cold. Dress warm in cold weather and stay hydrated in hot weather. If possible, avoid extreme temperatures like saunas, hot tubs, very hot or cold showers, or activities that can cause dehydration such as vigorous exercise. Check with your health care professional if you have severe diarrhea, nausea, and vomiting, or if you sweat a lot. The loss of too much body fluid may make it dangerous for you to take this medicine. You may get drowsy or dizzy. Do not drive, use machinery, or do anything that needs mental alertness until you know how this medicine affects you. Do not stand up or sit up quickly, especially if you are an older patient. This reduces the risk of dizzy or fainting spells. Alcohol may interfere with the effect  of this medicine. Avoid alcoholic drinks. Tell your health care professional right away if you have any change in your eyesight. Patients and their families should watch out for new or worsening depression or thoughts of suicide. Also watch out for sudden changes in feelings such as feeling anxious, agitated, panicky, irritable, hostile, aggressive, impulsive, severely restless, overly excited and hyperactive, or not being able to sleep. If this happens, especially at the beginning of treatment or after a change in dose, call your healthcare professional. This medicine may cause serious skin reactions. They can happen weeks to months after starting the medicine. Contact your health care provider right away if you notice fevers or flu-like symptoms with a rash. The rash may be red or purple and then turn into blisters or peeling of the skin. Or, you might notice a red rash with swelling of the face, lips or lymph nodes in your neck or under your arms. Birth control may not work properly while you are taking this medicine. Talk to your health care professional about using an extra method of birth control. Women should inform their health care professional if they wish to become pregnant or think they might be pregnant. There is a potential for serious side effects and harm to an unborn child. Talk to your health care professional for more information.  What side effects may I notice from receiving this medicine? Side effects that you should report to your doctor or health care professional as soon as possible:  allergic reactions like skin rash, itching or hives, swelling of the face, lips, or tongue  blood in the urine  changes in vision  confusion  loss of memory  pain in lower back or side  pain when urinating  redness, blistering, peeling or loosening of the skin, including inside the mouth  signs and symptoms of bleeding such as bloody or black, tarry stools; red or dark brown urine;  spitting up blood or brown material that looks like coffee grounds; red spots on the skin; unusual bruising or bleeding from the eyes, gums, or nose  signs and symptoms of increased acid in the body like breathing fast; fast heartbeat; headache; confusion; unusually weak or tired; nausea, vomiting  suicidal thoughts, mood changes  trouble speaking or understanding  unusual sweating  unusually weak or tired Side effects that usually do not require medical attention (report to your doctor or health care professional if they continue or are bothersome):  dizziness  drowsiness  fever  loss of appetite  nausea, vomiting  pain, tingling, numbness in the hands or feet  stomach pain  tiredness  upset stomach This list may not describe all possible side effects. Call your doctor for medical advice about side effects. You may report side effects to FDA at 1-800-FDA-1088. Where should I keep my medicine? Keep out of the reach of children. Store at room temperature between 15 and 30 degrees C (59 and 86 degrees F). Throw away any unused medicine after the expiration date. NOTE: This sheet is a summary. It may not cover all possible information. If you have questions about this medicine, talk to your doctor, pharmacist, or health care provider.  2020 Elsevier/Gold Standard (2019-06-13 15:07:20)    Gabapentin capsules or tablets What is this medicine? GABAPENTIN (GA ba pen tin) is used to control seizures in certain types of epilepsy. It is also used to treat certain types of nerve pain. This medicine may be used for other purposes; ask your health care provider or pharmacist if you have questions. COMMON BRAND NAME(S): Active-PAC with Gabapentin, Gabarone, Neurontin What should I tell my health care provider before I take this medicine? They need to know if you have any of these conditions:  history of drug abuse or alcohol abuse problem  kidney disease  lung or breathing  disease  suicidal thoughts, plans, or attempt; a previous suicide attempt by you or a family member  an unusual or allergic reaction to gabapentin, other medicines, foods, dyes, or preservatives  pregnant or trying to get pregnant  breast-feeding How should I use this medicine? Take this medicine by mouth with a glass of water. Follow the directions on the prescription label. You can take it with or without food. If it upsets your stomach, take it with food. Take your medicine at regular intervals. Do not take it more often than directed. Do not stop taking except on your doctor's advice. If you are directed to break the 600 or 800 mg tablets in half as part of your dose, the extra half tablet should be used for the next dose. If you have not used the extra half tablet within 28 days, it should be thrown away. A special MedGuide will be given to you by the pharmacist with each prescription and refill. Be sure to read this information carefully each time.  Talk to your pediatrician regarding the use of this medicine in children. While this drug may be prescribed for children as young as 3 years for selected conditions, precautions do apply. Overdosage: If you think you have taken too much of this medicine contact a poison control center or emergency room at once. NOTE: This medicine is only for you. Do not share this medicine with others. What if I miss a dose? If you miss a dose, take it as soon as you can. If it is almost time for your next dose, take only that dose. Do not take double or extra doses. What may interact with this medicine? This medicine may interact with the following medications:  alcohol  antihistamines for allergy, cough, and cold  certain medicines for anxiety or sleep  certain medicines for depression like amitriptyline, fluoxetine, sertraline  certain medicines for seizures like phenobarbital, primidone  certain medicines for stomach problems  general  anesthetics like halothane, isoflurane, methoxyflurane, propofol  local anesthetics like lidocaine, pramoxine, tetracaine  medicines that relax muscles for surgery  narcotic medicines for pain  phenothiazines like chlorpromazine, mesoridazine, prochlorperazine, thioridazine This list may not describe all possible interactions. Give your health care provider a list of all the medicines, herbs, non-prescription drugs, or dietary supplements you use. Also tell them if you smoke, drink alcohol, or use illegal drugs. Some items may interact with your medicine. What should I watch for while using this medicine? Visit your doctor or health care provider for regular checks on your progress. You may want to keep a record at home of how you feel your condition is responding to treatment. You may want to share this information with your doctor or health care provider at each visit. You should contact your doctor or health care provider if your seizures get worse or if you have any new types of seizures. Do not stop taking this medicine or any of your seizure medicines unless instructed by your doctor or health care provider. Stopping your medicine suddenly can increase your seizures or their severity. This medicine may cause serious skin reactions. They can happen weeks to months after starting the medicine. Contact your health care provider right away if you notice fevers or flu-like symptoms with a rash. The rash may be red or purple and then turn into blisters or peeling of the skin. Or, you might notice a red rash with swelling of the face, lips or lymph nodes in your neck or under your arms. Wear a medical identification bracelet or chain if you are taking this medicine for seizures, and carry a card that lists all your medications. You may get drowsy, dizzy, or have blurred vision. Do not drive, use machinery, or do anything that needs mental alertness until you know how this medicine affects you. To reduce  dizzy or fainting spells, do not sit or stand up quickly, especially if you are an older patient. Alcohol can increase drowsiness and dizziness. Avoid alcoholic drinks. Your mouth may get dry. Chewing sugarless gum or sucking hard candy, and drinking plenty of water will help. The use of this medicine may increase the chance of suicidal thoughts or actions. Pay special attention to how you are responding while on this medicine. Any worsening of mood, or thoughts of suicide or dying should be reported to your health care provider right away. Women who become pregnant while using this medicine may enroll in the [place] Pregnancy Registry by calling [phone]. This registry collects information about  the safety of antiepileptic drug use during pregnancy. What side effects may I notice from receiving this medicine? Side effects that you should report to your doctor or health care professional as soon as possible:  allergic reactions like skin rash, itching or hives, swelling of the face, lips, or tongue  breathing problems  rash, fever, and swollen lymph nodes  redness, blistering, peeling or loosening of the skin, including inside the mouth  suicidal thoughts, mood changes Side effects that usually do not require medical attention (report to your doctor or health care professional if they continue or are bothersome):  dizziness  drowsiness  headache  nausea, vomiting  swelling of ankles, feet, hands  tiredness This list may not describe all possible side effects. Call your doctor for medical advice about side effects. You may report side effects to FDA at 1-800-FDA-1088. Where should I keep my medicine? Keep out of reach of children. This medicine may cause accidental overdose and death if it taken by other adults, children, or pets. Mix any unused medicine with a substance like cat litter or coffee grounds. Then throw the medicine away in a sealed  container like a sealed bag or a coffee can with a lid. Do not use the medicine after the expiration date. Store at room temperature between 15 and 30 degrees C (59 and 86 degrees F). NOTE: This sheet is a summary. It may not cover all possible information. If you have questions about this medicine, talk to your doctor, pharmacist, or health care provider.  2020 Elsevier/Gold Standard (2019-02-15 14:16:43)

## 2020-04-07 NOTE — Progress Notes (Signed)
I agree with the above plan 

## 2020-08-11 ENCOUNTER — Ambulatory Visit: Payer: Medicare Other | Admitting: Adult Health

## 2021-05-07 DIAGNOSIS — R7303 Prediabetes: Secondary | ICD-10-CM | POA: Diagnosis not present

## 2021-05-07 DIAGNOSIS — R609 Edema, unspecified: Secondary | ICD-10-CM | POA: Diagnosis not present

## 2021-05-07 DIAGNOSIS — E7800 Pure hypercholesterolemia, unspecified: Secondary | ICD-10-CM | POA: Diagnosis not present

## 2021-05-07 DIAGNOSIS — E871 Hypo-osmolality and hyponatremia: Secondary | ICD-10-CM | POA: Diagnosis not present

## 2021-05-07 DIAGNOSIS — R269 Unspecified abnormalities of gait and mobility: Secondary | ICD-10-CM | POA: Diagnosis not present

## 2021-05-07 DIAGNOSIS — I6930 Unspecified sequelae of cerebral infarction: Secondary | ICD-10-CM | POA: Diagnosis not present

## 2021-05-07 DIAGNOSIS — I10 Essential (primary) hypertension: Secondary | ICD-10-CM | POA: Diagnosis not present

## 2021-05-07 DIAGNOSIS — N183 Chronic kidney disease, stage 3 unspecified: Secondary | ICD-10-CM | POA: Diagnosis not present

## 2021-05-07 DIAGNOSIS — E039 Hypothyroidism, unspecified: Secondary | ICD-10-CM | POA: Diagnosis not present

## 2021-05-07 DIAGNOSIS — R251 Tremor, unspecified: Secondary | ICD-10-CM | POA: Diagnosis not present

## 2021-05-07 DIAGNOSIS — D692 Other nonthrombocytopenic purpura: Secondary | ICD-10-CM | POA: Diagnosis not present

## 2021-05-18 DIAGNOSIS — K279 Peptic ulcer, site unspecified, unspecified as acute or chronic, without hemorrhage or perforation: Secondary | ICD-10-CM | POA: Diagnosis not present

## 2021-05-18 DIAGNOSIS — E039 Hypothyroidism, unspecified: Secondary | ICD-10-CM | POA: Diagnosis not present

## 2021-05-18 DIAGNOSIS — E871 Hypo-osmolality and hyponatremia: Secondary | ICD-10-CM | POA: Diagnosis not present

## 2021-05-18 DIAGNOSIS — M8580 Other specified disorders of bone density and structure, unspecified site: Secondary | ICD-10-CM | POA: Diagnosis not present

## 2021-05-18 DIAGNOSIS — E7800 Pure hypercholesterolemia, unspecified: Secondary | ICD-10-CM | POA: Diagnosis not present

## 2021-05-18 DIAGNOSIS — N183 Chronic kidney disease, stage 3 unspecified: Secondary | ICD-10-CM | POA: Diagnosis not present

## 2021-05-18 DIAGNOSIS — K209 Esophagitis, unspecified without bleeding: Secondary | ICD-10-CM | POA: Diagnosis not present

## 2021-05-18 DIAGNOSIS — M810 Age-related osteoporosis without current pathological fracture: Secondary | ICD-10-CM | POA: Diagnosis not present

## 2021-05-18 DIAGNOSIS — I129 Hypertensive chronic kidney disease with stage 1 through stage 4 chronic kidney disease, or unspecified chronic kidney disease: Secondary | ICD-10-CM | POA: Diagnosis not present

## 2021-05-18 DIAGNOSIS — R7303 Prediabetes: Secondary | ICD-10-CM | POA: Diagnosis not present

## 2021-05-18 DIAGNOSIS — K449 Diaphragmatic hernia without obstruction or gangrene: Secondary | ICD-10-CM | POA: Diagnosis not present

## 2021-05-18 DIAGNOSIS — I69351 Hemiplegia and hemiparesis following cerebral infarction affecting right dominant side: Secondary | ICD-10-CM | POA: Diagnosis not present

## 2021-05-19 DIAGNOSIS — E7800 Pure hypercholesterolemia, unspecified: Secondary | ICD-10-CM | POA: Diagnosis not present

## 2021-05-19 DIAGNOSIS — E039 Hypothyroidism, unspecified: Secondary | ICD-10-CM | POA: Diagnosis not present

## 2021-05-19 DIAGNOSIS — N183 Chronic kidney disease, stage 3 unspecified: Secondary | ICD-10-CM | POA: Diagnosis not present

## 2021-05-19 DIAGNOSIS — I129 Hypertensive chronic kidney disease with stage 1 through stage 4 chronic kidney disease, or unspecified chronic kidney disease: Secondary | ICD-10-CM | POA: Diagnosis not present

## 2021-05-19 DIAGNOSIS — I69351 Hemiplegia and hemiparesis following cerebral infarction affecting right dominant side: Secondary | ICD-10-CM | POA: Diagnosis not present

## 2021-05-27 DIAGNOSIS — E7800 Pure hypercholesterolemia, unspecified: Secondary | ICD-10-CM | POA: Diagnosis not present

## 2021-05-27 DIAGNOSIS — N183 Chronic kidney disease, stage 3 unspecified: Secondary | ICD-10-CM | POA: Diagnosis not present

## 2021-05-27 DIAGNOSIS — I69351 Hemiplegia and hemiparesis following cerebral infarction affecting right dominant side: Secondary | ICD-10-CM | POA: Diagnosis not present

## 2021-05-27 DIAGNOSIS — E039 Hypothyroidism, unspecified: Secondary | ICD-10-CM | POA: Diagnosis not present

## 2021-05-27 DIAGNOSIS — I129 Hypertensive chronic kidney disease with stage 1 through stage 4 chronic kidney disease, or unspecified chronic kidney disease: Secondary | ICD-10-CM | POA: Diagnosis not present

## 2021-06-02 DIAGNOSIS — E039 Hypothyroidism, unspecified: Secondary | ICD-10-CM | POA: Diagnosis not present

## 2021-06-02 DIAGNOSIS — I69351 Hemiplegia and hemiparesis following cerebral infarction affecting right dominant side: Secondary | ICD-10-CM | POA: Diagnosis not present

## 2021-06-02 DIAGNOSIS — N183 Chronic kidney disease, stage 3 unspecified: Secondary | ICD-10-CM | POA: Diagnosis not present

## 2021-06-02 DIAGNOSIS — I129 Hypertensive chronic kidney disease with stage 1 through stage 4 chronic kidney disease, or unspecified chronic kidney disease: Secondary | ICD-10-CM | POA: Diagnosis not present

## 2021-06-02 DIAGNOSIS — E7800 Pure hypercholesterolemia, unspecified: Secondary | ICD-10-CM | POA: Diagnosis not present

## 2021-06-05 DIAGNOSIS — N183 Chronic kidney disease, stage 3 unspecified: Secondary | ICD-10-CM | POA: Diagnosis not present

## 2021-06-05 DIAGNOSIS — I69351 Hemiplegia and hemiparesis following cerebral infarction affecting right dominant side: Secondary | ICD-10-CM | POA: Diagnosis not present

## 2021-06-05 DIAGNOSIS — E039 Hypothyroidism, unspecified: Secondary | ICD-10-CM | POA: Diagnosis not present

## 2021-06-05 DIAGNOSIS — I129 Hypertensive chronic kidney disease with stage 1 through stage 4 chronic kidney disease, or unspecified chronic kidney disease: Secondary | ICD-10-CM | POA: Diagnosis not present

## 2021-06-05 DIAGNOSIS — E7800 Pure hypercholesterolemia, unspecified: Secondary | ICD-10-CM | POA: Diagnosis not present

## 2021-06-07 DIAGNOSIS — I69351 Hemiplegia and hemiparesis following cerebral infarction affecting right dominant side: Secondary | ICD-10-CM | POA: Diagnosis not present

## 2021-06-07 DIAGNOSIS — E7800 Pure hypercholesterolemia, unspecified: Secondary | ICD-10-CM | POA: Diagnosis not present

## 2021-06-07 DIAGNOSIS — N183 Chronic kidney disease, stage 3 unspecified: Secondary | ICD-10-CM | POA: Diagnosis not present

## 2021-06-07 DIAGNOSIS — E039 Hypothyroidism, unspecified: Secondary | ICD-10-CM | POA: Diagnosis not present

## 2021-06-07 DIAGNOSIS — I129 Hypertensive chronic kidney disease with stage 1 through stage 4 chronic kidney disease, or unspecified chronic kidney disease: Secondary | ICD-10-CM | POA: Diagnosis not present

## 2021-06-11 DIAGNOSIS — N183 Chronic kidney disease, stage 3 unspecified: Secondary | ICD-10-CM | POA: Diagnosis not present

## 2021-06-11 DIAGNOSIS — E7800 Pure hypercholesterolemia, unspecified: Secondary | ICD-10-CM | POA: Diagnosis not present

## 2021-06-11 DIAGNOSIS — E039 Hypothyroidism, unspecified: Secondary | ICD-10-CM | POA: Diagnosis not present

## 2021-06-11 DIAGNOSIS — I69351 Hemiplegia and hemiparesis following cerebral infarction affecting right dominant side: Secondary | ICD-10-CM | POA: Diagnosis not present

## 2021-06-11 DIAGNOSIS — I129 Hypertensive chronic kidney disease with stage 1 through stage 4 chronic kidney disease, or unspecified chronic kidney disease: Secondary | ICD-10-CM | POA: Diagnosis not present

## 2021-06-15 DIAGNOSIS — N183 Chronic kidney disease, stage 3 unspecified: Secondary | ICD-10-CM | POA: Diagnosis not present

## 2021-06-15 DIAGNOSIS — I69351 Hemiplegia and hemiparesis following cerebral infarction affecting right dominant side: Secondary | ICD-10-CM | POA: Diagnosis not present

## 2021-06-15 DIAGNOSIS — E7800 Pure hypercholesterolemia, unspecified: Secondary | ICD-10-CM | POA: Diagnosis not present

## 2021-06-15 DIAGNOSIS — E039 Hypothyroidism, unspecified: Secondary | ICD-10-CM | POA: Diagnosis not present

## 2021-06-15 DIAGNOSIS — I129 Hypertensive chronic kidney disease with stage 1 through stage 4 chronic kidney disease, or unspecified chronic kidney disease: Secondary | ICD-10-CM | POA: Diagnosis not present

## 2021-06-17 DIAGNOSIS — E7800 Pure hypercholesterolemia, unspecified: Secondary | ICD-10-CM | POA: Diagnosis not present

## 2021-06-17 DIAGNOSIS — N183 Chronic kidney disease, stage 3 unspecified: Secondary | ICD-10-CM | POA: Diagnosis not present

## 2021-06-17 DIAGNOSIS — E039 Hypothyroidism, unspecified: Secondary | ICD-10-CM | POA: Diagnosis not present

## 2021-06-17 DIAGNOSIS — I129 Hypertensive chronic kidney disease with stage 1 through stage 4 chronic kidney disease, or unspecified chronic kidney disease: Secondary | ICD-10-CM | POA: Diagnosis not present

## 2021-06-17 DIAGNOSIS — I69351 Hemiplegia and hemiparesis following cerebral infarction affecting right dominant side: Secondary | ICD-10-CM | POA: Diagnosis not present

## 2021-06-23 DIAGNOSIS — I69351 Hemiplegia and hemiparesis following cerebral infarction affecting right dominant side: Secondary | ICD-10-CM | POA: Diagnosis not present

## 2021-06-23 DIAGNOSIS — E7800 Pure hypercholesterolemia, unspecified: Secondary | ICD-10-CM | POA: Diagnosis not present

## 2021-06-23 DIAGNOSIS — E039 Hypothyroidism, unspecified: Secondary | ICD-10-CM | POA: Diagnosis not present

## 2021-06-23 DIAGNOSIS — I129 Hypertensive chronic kidney disease with stage 1 through stage 4 chronic kidney disease, or unspecified chronic kidney disease: Secondary | ICD-10-CM | POA: Diagnosis not present

## 2021-06-23 DIAGNOSIS — N183 Chronic kidney disease, stage 3 unspecified: Secondary | ICD-10-CM | POA: Diagnosis not present

## 2021-07-01 DIAGNOSIS — N183 Chronic kidney disease, stage 3 unspecified: Secondary | ICD-10-CM | POA: Diagnosis not present

## 2021-07-01 DIAGNOSIS — I129 Hypertensive chronic kidney disease with stage 1 through stage 4 chronic kidney disease, or unspecified chronic kidney disease: Secondary | ICD-10-CM | POA: Diagnosis not present

## 2021-07-01 DIAGNOSIS — E039 Hypothyroidism, unspecified: Secondary | ICD-10-CM | POA: Diagnosis not present

## 2021-07-01 DIAGNOSIS — I69351 Hemiplegia and hemiparesis following cerebral infarction affecting right dominant side: Secondary | ICD-10-CM | POA: Diagnosis not present

## 2021-07-01 DIAGNOSIS — E7800 Pure hypercholesterolemia, unspecified: Secondary | ICD-10-CM | POA: Diagnosis not present

## 2021-07-05 DIAGNOSIS — E7800 Pure hypercholesterolemia, unspecified: Secondary | ICD-10-CM | POA: Diagnosis not present

## 2021-07-05 DIAGNOSIS — I69351 Hemiplegia and hemiparesis following cerebral infarction affecting right dominant side: Secondary | ICD-10-CM | POA: Diagnosis not present

## 2021-07-05 DIAGNOSIS — N183 Chronic kidney disease, stage 3 unspecified: Secondary | ICD-10-CM | POA: Diagnosis not present

## 2021-07-05 DIAGNOSIS — I129 Hypertensive chronic kidney disease with stage 1 through stage 4 chronic kidney disease, or unspecified chronic kidney disease: Secondary | ICD-10-CM | POA: Diagnosis not present

## 2021-07-05 DIAGNOSIS — E039 Hypothyroidism, unspecified: Secondary | ICD-10-CM | POA: Diagnosis not present

## 2021-07-14 DIAGNOSIS — E039 Hypothyroidism, unspecified: Secondary | ICD-10-CM | POA: Diagnosis not present

## 2021-07-14 DIAGNOSIS — I69351 Hemiplegia and hemiparesis following cerebral infarction affecting right dominant side: Secondary | ICD-10-CM | POA: Diagnosis not present

## 2021-07-14 DIAGNOSIS — I129 Hypertensive chronic kidney disease with stage 1 through stage 4 chronic kidney disease, or unspecified chronic kidney disease: Secondary | ICD-10-CM | POA: Diagnosis not present

## 2021-07-14 DIAGNOSIS — N183 Chronic kidney disease, stage 3 unspecified: Secondary | ICD-10-CM | POA: Diagnosis not present

## 2021-07-14 DIAGNOSIS — E7800 Pure hypercholesterolemia, unspecified: Secondary | ICD-10-CM | POA: Diagnosis not present

## 2021-10-06 ENCOUNTER — Emergency Department (HOSPITAL_COMMUNITY): Payer: Medicare Other

## 2021-10-06 ENCOUNTER — Inpatient Hospital Stay (HOSPITAL_COMMUNITY): Payer: Medicare Other

## 2021-10-06 ENCOUNTER — Encounter (HOSPITAL_COMMUNITY)
Admission: EM | Disposition: A | Discharge status: Skilled Nursing Facility | Payer: Self-pay | Source: Home / Self Care | Attending: Family Medicine

## 2021-10-06 ENCOUNTER — Inpatient Hospital Stay (HOSPITAL_COMMUNITY): Payer: Medicare Other | Admitting: Certified Registered"

## 2021-10-06 ENCOUNTER — Encounter (HOSPITAL_COMMUNITY): Payer: Self-pay | Admitting: Internal Medicine

## 2021-10-06 ENCOUNTER — Other Ambulatory Visit: Payer: Self-pay

## 2021-10-06 ENCOUNTER — Inpatient Hospital Stay (HOSPITAL_COMMUNITY)
Admission: EM | Admit: 2021-10-06 | Discharge: 2021-10-12 | DRG: 522 | Disposition: A | Discharge status: Skilled Nursing Facility | Payer: Medicare Other | Attending: Family Medicine | Admitting: Family Medicine

## 2021-10-06 DIAGNOSIS — S72031A Displaced midcervical fracture of right femur, initial encounter for closed fracture: Principal | ICD-10-CM | POA: Diagnosis present

## 2021-10-06 DIAGNOSIS — W19XXXA Unspecified fall, initial encounter: Secondary | ICD-10-CM

## 2021-10-06 DIAGNOSIS — Z4789 Encounter for other orthopedic aftercare: Secondary | ICD-10-CM | POA: Diagnosis not present

## 2021-10-06 DIAGNOSIS — R42 Dizziness and giddiness: Secondary | ICD-10-CM | POA: Diagnosis present

## 2021-10-06 DIAGNOSIS — Z9889 Other specified postprocedural states: Secondary | ICD-10-CM

## 2021-10-06 DIAGNOSIS — R6889 Other general symptoms and signs: Secondary | ICD-10-CM | POA: Diagnosis not present

## 2021-10-06 DIAGNOSIS — Z20822 Contact with and (suspected) exposure to covid-19: Secondary | ICD-10-CM | POA: Diagnosis not present

## 2021-10-06 DIAGNOSIS — Z90710 Acquired absence of both cervix and uterus: Secondary | ICD-10-CM

## 2021-10-06 DIAGNOSIS — S79911A Unspecified injury of right hip, initial encounter: Secondary | ICD-10-CM | POA: Diagnosis not present

## 2021-10-06 DIAGNOSIS — Z96641 Presence of right artificial hip joint: Secondary | ICD-10-CM | POA: Diagnosis not present

## 2021-10-06 DIAGNOSIS — M47812 Spondylosis without myelopathy or radiculopathy, cervical region: Secondary | ICD-10-CM | POA: Diagnosis not present

## 2021-10-06 DIAGNOSIS — F17210 Nicotine dependence, cigarettes, uncomplicated: Secondary | ICD-10-CM | POA: Diagnosis not present

## 2021-10-06 DIAGNOSIS — S72001A Fracture of unspecified part of neck of right femur, initial encounter for closed fracture: Secondary | ICD-10-CM | POA: Diagnosis not present

## 2021-10-06 DIAGNOSIS — M25551 Pain in right hip: Secondary | ICD-10-CM | POA: Diagnosis not present

## 2021-10-06 DIAGNOSIS — Z8673 Personal history of transient ischemic attack (TIA), and cerebral infarction without residual deficits: Secondary | ICD-10-CM

## 2021-10-06 DIAGNOSIS — E871 Hypo-osmolality and hyponatremia: Secondary | ICD-10-CM | POA: Diagnosis present

## 2021-10-06 DIAGNOSIS — E785 Hyperlipidemia, unspecified: Secondary | ICD-10-CM | POA: Diagnosis not present

## 2021-10-06 DIAGNOSIS — R251 Tremor, unspecified: Secondary | ICD-10-CM | POA: Diagnosis not present

## 2021-10-06 DIAGNOSIS — M6281 Muscle weakness (generalized): Secondary | ICD-10-CM | POA: Diagnosis not present

## 2021-10-06 DIAGNOSIS — Z66 Do not resuscitate: Secondary | ICD-10-CM | POA: Diagnosis present

## 2021-10-06 DIAGNOSIS — Z741 Need for assistance with personal care: Secondary | ICD-10-CM | POA: Diagnosis not present

## 2021-10-06 DIAGNOSIS — Z79890 Hormone replacement therapy (postmenopausal): Secondary | ICD-10-CM | POA: Diagnosis not present

## 2021-10-06 DIAGNOSIS — Z7401 Bed confinement status: Secondary | ICD-10-CM | POA: Diagnosis not present

## 2021-10-06 DIAGNOSIS — D62 Acute posthemorrhagic anemia: Secondary | ICD-10-CM | POA: Diagnosis not present

## 2021-10-06 DIAGNOSIS — R279 Unspecified lack of coordination: Secondary | ICD-10-CM | POA: Diagnosis not present

## 2021-10-06 DIAGNOSIS — Z043 Encounter for examination and observation following other accident: Secondary | ICD-10-CM | POA: Diagnosis not present

## 2021-10-06 DIAGNOSIS — Z471 Aftercare following joint replacement surgery: Secondary | ICD-10-CM | POA: Diagnosis not present

## 2021-10-06 DIAGNOSIS — E039 Hypothyroidism, unspecified: Secondary | ICD-10-CM | POA: Diagnosis present

## 2021-10-06 DIAGNOSIS — Z7982 Long term (current) use of aspirin: Secondary | ICD-10-CM | POA: Diagnosis not present

## 2021-10-06 DIAGNOSIS — R410 Disorientation, unspecified: Secondary | ICD-10-CM | POA: Diagnosis not present

## 2021-10-06 DIAGNOSIS — I951 Orthostatic hypotension: Secondary | ICD-10-CM | POA: Diagnosis not present

## 2021-10-06 DIAGNOSIS — Z79899 Other long term (current) drug therapy: Secondary | ICD-10-CM | POA: Diagnosis not present

## 2021-10-06 DIAGNOSIS — S72001S Fracture of unspecified part of neck of right femur, sequela: Secondary | ICD-10-CM | POA: Diagnosis not present

## 2021-10-06 DIAGNOSIS — M2550 Pain in unspecified joint: Secondary | ICD-10-CM | POA: Diagnosis not present

## 2021-10-06 DIAGNOSIS — R2681 Unsteadiness on feet: Secondary | ICD-10-CM | POA: Diagnosis not present

## 2021-10-06 DIAGNOSIS — I10 Essential (primary) hypertension: Secondary | ICD-10-CM | POA: Diagnosis not present

## 2021-10-06 DIAGNOSIS — R1312 Dysphagia, oropharyngeal phase: Secondary | ICD-10-CM | POA: Diagnosis not present

## 2021-10-06 DIAGNOSIS — I614 Nontraumatic intracerebral hemorrhage in cerebellum: Secondary | ICD-10-CM | POA: Diagnosis not present

## 2021-10-06 DIAGNOSIS — R001 Bradycardia, unspecified: Secondary | ICD-10-CM | POA: Diagnosis not present

## 2021-10-06 DIAGNOSIS — F419 Anxiety disorder, unspecified: Secondary | ICD-10-CM | POA: Diagnosis present

## 2021-10-06 DIAGNOSIS — W010XXA Fall on same level from slipping, tripping and stumbling without subsequent striking against object, initial encounter: Secondary | ICD-10-CM | POA: Diagnosis present

## 2021-10-06 DIAGNOSIS — I440 Atrioventricular block, first degree: Secondary | ICD-10-CM | POA: Diagnosis present

## 2021-10-06 DIAGNOSIS — Z743 Need for continuous supervision: Secondary | ICD-10-CM | POA: Diagnosis not present

## 2021-10-06 DIAGNOSIS — I69393 Ataxia following cerebral infarction: Secondary | ICD-10-CM | POA: Diagnosis not present

## 2021-10-06 DIAGNOSIS — I69391 Dysphagia following cerebral infarction: Secondary | ICD-10-CM | POA: Diagnosis not present

## 2021-10-06 DIAGNOSIS — R600 Localized edema: Secondary | ICD-10-CM | POA: Diagnosis not present

## 2021-10-06 DIAGNOSIS — M79661 Pain in right lower leg: Secondary | ICD-10-CM | POA: Diagnosis not present

## 2021-10-06 DIAGNOSIS — S0990XA Unspecified injury of head, initial encounter: Secondary | ICD-10-CM | POA: Diagnosis not present

## 2021-10-06 HISTORY — PX: HIP ARTHROPLASTY: SHX981

## 2021-10-06 LAB — BASIC METABOLIC PANEL
Anion gap: 6 (ref 5–15)
BUN: 17 mg/dL (ref 8–23)
CO2: 26 mmol/L (ref 22–32)
Calcium: 9.4 mg/dL (ref 8.9–10.3)
Chloride: 97 mmol/L — ABNORMAL LOW (ref 98–111)
Creatinine, Ser: 0.84 mg/dL (ref 0.44–1.00)
GFR, Estimated: >60 mL/min (ref >60)
Glucose, Bld: 139 mg/dL — ABNORMAL HIGH (ref 70–99)
Potassium: 4.9 mmol/L (ref 3.5–5.1)
Sodium: 129 mmol/L — ABNORMAL LOW (ref 135–145)

## 2021-10-06 LAB — CBC WITH DIFFERENTIAL/PLATELET
Abs Immature Granulocytes: 0.04 10*3/uL (ref 0.00–0.07)
Basophils Absolute: 0.1 10*3/uL (ref 0.0–0.1)
Basophils Relative: 0 %
Eosinophils Absolute: 0.1 10*3/uL (ref 0.0–0.5)
Eosinophils Relative: 1 %
HCT: 37.2 % (ref 36.0–46.0)
Hemoglobin: 12.5 g/dL (ref 12.0–15.0)
Immature Granulocytes: 0 %
Lymphocytes Relative: 15 %
Lymphs Abs: 1.7 10*3/uL (ref 0.7–4.0)
MCH: 30.8 pg (ref 26.0–34.0)
MCHC: 33.6 g/dL (ref 30.0–36.0)
MCV: 91.6 fL (ref 80.0–100.0)
Monocytes Absolute: 1.1 10*3/uL — ABNORMAL HIGH (ref 0.1–1.0)
Monocytes Relative: 10 %
Neutro Abs: 8.5 10*3/uL — ABNORMAL HIGH (ref 1.7–7.7)
Neutrophils Relative %: 74 %
Platelets: 324 10*3/uL (ref 150–400)
RBC: 4.06 MIL/uL (ref 3.87–5.11)
RDW: 13.7 % (ref 11.5–15.5)
WBC: 11.5 10*3/uL — ABNORMAL HIGH (ref 4.0–10.5)
nRBC: 0 % (ref 0.0–0.2)

## 2021-10-06 LAB — RESP PANEL BY RT-PCR (FLU A&B, COVID) ARPGX2
Influenza A by PCR: NEGATIVE
Influenza B by PCR: NEGATIVE
SARS Coronavirus 2 by RT PCR: NEGATIVE

## 2021-10-06 SURGERY — HEMIARTHROPLASTY, HIP, DIRECT ANTERIOR APPROACH, FOR FRACTURE
Anesthesia: General | Site: Hip | Laterality: Right

## 2021-10-06 MED ORDER — ALBUMIN HUMAN 5 % IV SOLN: INTRAVENOUS | Status: DC | PRN

## 2021-10-06 MED ORDER — FENTANYL CITRATE PF 50 MCG/ML IJ SOSY
50.0000 ug | PREFILLED_SYRINGE | Freq: Once | INTRAMUSCULAR | Status: AC
  Administered 2021-10-06: 50 ug via INTRAVENOUS
  Filled 2021-10-06: qty 1

## 2021-10-06 MED ORDER — CHLORHEXIDINE GLUCONATE 0.12 % MT SOLN
15.0000 mL | OROMUCOSAL | Status: AC
  Filled 2021-10-06: qty 15

## 2021-10-06 MED ORDER — LIDOCAINE 2% (20 MG/ML) 5 ML SYRINGE
INTRAMUSCULAR | Status: DC | PRN
  Administered 2021-10-06: 40 mg via INTRAVENOUS

## 2021-10-06 MED ORDER — CEFAZOLIN SODIUM-DEXTROSE 2-3 GM-%(50ML) IV SOLR
INTRAVENOUS | Status: DC | PRN
  Administered 2021-10-06: 2 g via INTRAVENOUS

## 2021-10-06 MED ORDER — ONDANSETRON HCL 4 MG/2ML IJ SOLN
4.0000 mg | Freq: Once | INTRAMUSCULAR | Status: AC
  Administered 2021-10-06: 4 mg via INTRAVENOUS
  Filled 2021-10-06: qty 2

## 2021-10-06 MED ORDER — TRANEXAMIC ACID-NACL 1000-0.7 MG/100ML-% IV SOLN
INTRAVENOUS | Status: DC | PRN
  Administered 2021-10-06: 1000 mg via INTRAVENOUS

## 2021-10-06 MED ORDER — PHENYLEPHRINE 40 MCG/ML (10ML) SYRINGE FOR IV PUSH (FOR BLOOD PRESSURE SUPPORT)
PREFILLED_SYRINGE | INTRAVENOUS | Status: DC | PRN
  Administered 2021-10-06: 160 ug via INTRAVENOUS
  Administered 2021-10-06 (×3): 80 ug via INTRAVENOUS

## 2021-10-06 MED ORDER — ONDANSETRON HCL 4 MG/2ML IJ SOLN
INTRAMUSCULAR | Status: AC
  Filled 2021-10-06: qty 2

## 2021-10-06 MED ORDER — LACTATED RINGERS IV SOLN: INTRAVENOUS | Status: DC

## 2021-10-06 MED ORDER — EPHEDRINE SULFATE-NACL 50-0.9 MG/10ML-% IV SOSY
PREFILLED_SYRINGE | INTRAVENOUS | Status: DC | PRN
  Administered 2021-10-06 (×2): 5 mg via INTRAVENOUS
  Administered 2021-10-06: 10 mg via INTRAVENOUS

## 2021-10-06 MED ORDER — SODIUM CHLORIDE 0.9 % IR SOLN
Status: DC | PRN
  Administered 2021-10-06: 3000 mL

## 2021-10-06 MED ORDER — EPHEDRINE 5 MG/ML INJ
INTRAVENOUS | Status: AC
  Filled 2021-10-06: qty 10

## 2021-10-06 MED ORDER — BUPIVACAINE HCL 0.25 % IJ SOLN
INTRAMUSCULAR | Status: DC | PRN
  Administered 2021-10-06: 30 mL

## 2021-10-06 MED ORDER — 0.9 % SODIUM CHLORIDE (POUR BTL) OPTIME
TOPICAL | Status: DC | PRN
  Administered 2021-10-06: 1000 mL

## 2021-10-06 MED ORDER — TRANEXAMIC ACID-NACL 1000-0.7 MG/100ML-% IV SOLN
INTRAVENOUS | Status: AC
  Filled 2021-10-06: qty 100

## 2021-10-06 MED ORDER — ONDANSETRON HCL 4 MG/2ML IJ SOLN
INTRAMUSCULAR | Status: DC | PRN
  Administered 2021-10-06: 4 mg via INTRAVENOUS

## 2021-10-06 MED ORDER — FENTANYL CITRATE (PF) 100 MCG/2ML IJ SOLN
25.0000 ug | INTRAMUSCULAR | Status: DC | PRN
  Administered 2021-10-06 (×3): 25 ug via INTRAVENOUS

## 2021-10-06 MED ORDER — ACETAMINOPHEN 500 MG PO TABS
ORAL_TABLET | ORAL | Status: AC
  Administered 2021-10-06: 1000 mg via ORAL
  Filled 2021-10-06: qty 2

## 2021-10-06 MED ORDER — DEXAMETHASONE SODIUM PHOSPHATE 10 MG/ML IJ SOLN
INTRAMUSCULAR | Status: DC | PRN
  Administered 2021-10-06: 10 mg via INTRAVENOUS

## 2021-10-06 MED ORDER — PHENYLEPHRINE HCL-NACL 20-0.9 MG/250ML-% IV SOLN
INTRAVENOUS | Status: DC | PRN
  Administered 2021-10-06: 50 ug/min via INTRAVENOUS

## 2021-10-06 MED ORDER — BUPIVACAINE HCL (PF) 0.25 % IJ SOLN
INTRAMUSCULAR | Status: AC
  Filled 2021-10-06: qty 30

## 2021-10-06 MED ORDER — HYDROCODONE-ACETAMINOPHEN 5-325 MG PO TABS
1.0000 | ORAL_TABLET | Freq: Four times a day (QID) | ORAL | Status: DC | PRN
  Administered 2021-10-07: 2 via ORAL
  Administered 2021-10-07: 1 via ORAL
  Administered 2021-10-08: 2 via ORAL
  Administered 2021-10-08: 1 via ORAL
  Administered 2021-10-09 (×2): 2 via ORAL
  Administered 2021-10-10: 1 via ORAL
  Filled 2021-10-06: qty 1
  Filled 2021-10-06: qty 2
  Filled 2021-10-06: qty 1
  Filled 2021-10-06: qty 2
  Filled 2021-10-06: qty 1
  Filled 2021-10-06 (×2): qty 2
  Filled 2021-10-06: qty 1

## 2021-10-06 MED ORDER — SUCCINYLCHOLINE CHLORIDE 200 MG/10ML IV SOSY
PREFILLED_SYRINGE | INTRAVENOUS | Status: AC
  Filled 2021-10-06: qty 10

## 2021-10-06 MED ORDER — DEXAMETHASONE SODIUM PHOSPHATE 10 MG/ML IJ SOLN
INTRAMUSCULAR | Status: AC
  Filled 2021-10-06: qty 1

## 2021-10-06 MED ORDER — CEFAZOLIN SODIUM-DEXTROSE 2-4 GM/100ML-% IV SOLN
2.0000 g | Freq: Three times a day (TID) | INTRAVENOUS | Status: AC
  Administered 2021-10-07 (×2): 2 g via INTRAVENOUS
  Filled 2021-10-06 (×2): qty 100

## 2021-10-06 MED ORDER — MORPHINE SULFATE (PF) 2 MG/ML IV SOLN
0.5000 mg | INTRAVENOUS | Status: DC | PRN
  Administered 2021-10-06 – 2021-10-07 (×2): 0.5 mg via INTRAVENOUS
  Filled 2021-10-06 (×2): qty 1

## 2021-10-06 MED ORDER — SUGAMMADEX SODIUM 200 MG/2ML IV SOLN
INTRAVENOUS | Status: DC | PRN
Start: 2021-10-06 — End: 2021-10-06
  Administered 2021-10-06: 100 mg via INTRAVENOUS

## 2021-10-06 MED ORDER — PHENYLEPHRINE 40 MCG/ML (10ML) SYRINGE FOR IV PUSH (FOR BLOOD PRESSURE SUPPORT)
PREFILLED_SYRINGE | INTRAVENOUS | Status: AC
  Filled 2021-10-06: qty 20

## 2021-10-06 MED ORDER — AMISULPRIDE (ANTIEMETIC) 5 MG/2ML IV SOLN: 10.0000 mg | Freq: Once | INTRAVENOUS | Status: DC | PRN

## 2021-10-06 MED ORDER — FENTANYL CITRATE (PF) 250 MCG/5ML IJ SOLN
INTRAMUSCULAR | Status: DC | PRN
  Administered 2021-10-06: 50 ug via INTRAVENOUS
  Administered 2021-10-06: 25 ug via INTRAVENOUS
  Administered 2021-10-06: 50 ug via INTRAVENOUS

## 2021-10-06 MED ORDER — CHLORHEXIDINE GLUCONATE 0.12 % MT SOLN
OROMUCOSAL | Status: AC
  Administered 2021-10-06: 15 mL via OROMUCOSAL
  Filled 2021-10-06: qty 15

## 2021-10-06 MED ORDER — ACETAMINOPHEN 500 MG PO TABS: 1000.0000 mg | ORAL_TABLET | Freq: Once | ORAL | Status: AC

## 2021-10-06 MED ORDER — PROPOFOL 10 MG/ML IV BOLUS
INTRAVENOUS | Status: DC | PRN
  Administered 2021-10-06: 80 mg via INTRAVENOUS

## 2021-10-06 MED ORDER — SODIUM CHLORIDE 0.9 % IV SOLN: INTRAVENOUS | Status: DC | PRN

## 2021-10-06 MED ORDER — ENOXAPARIN SODIUM 40 MG/0.4ML IJ SOSY
40.0000 mg | PREFILLED_SYRINGE | INTRAMUSCULAR | Status: DC
  Administered 2021-10-07 – 2021-10-12 (×6): 40 mg via SUBCUTANEOUS
  Filled 2021-10-06 (×7): qty 0.4

## 2021-10-06 MED ORDER — SODIUM CHLORIDE 0.9 % IR SOLN
Status: DC | PRN
  Administered 2021-10-06: 500 mL

## 2021-10-06 MED ORDER — ROCURONIUM BROMIDE 10 MG/ML (PF) SYRINGE
PREFILLED_SYRINGE | INTRAVENOUS | Status: DC | PRN
  Administered 2021-10-06 (×2): 30 mg via INTRAVENOUS

## 2021-10-06 MED ORDER — FENTANYL CITRATE (PF) 100 MCG/2ML IJ SOLN
INTRAMUSCULAR | Status: AC
  Administered 2021-10-06: 25 ug via INTRAVENOUS
  Filled 2021-10-06: qty 2

## 2021-10-06 MED ORDER — ROCURONIUM BROMIDE 10 MG/ML (PF) SYRINGE
PREFILLED_SYRINGE | INTRAVENOUS | Status: AC
  Filled 2021-10-06: qty 10

## 2021-10-06 MED ORDER — FENTANYL CITRATE (PF) 250 MCG/5ML IJ SOLN
INTRAMUSCULAR | Status: AC
  Filled 2021-10-06: qty 5

## 2021-10-06 SURGICAL SUPPLY — 70 items
ADH SKN CLS APL DERMABOND .7 (GAUZE/BANDAGES/DRESSINGS) ×2
APL PRP STRL LF DISP 70% ISPRP (MISCELLANEOUS) ×2
BLADE SAW SAG HD 89.5X25X.94 (BLADE) ×1 IMPLANT
BRUSH FEMORAL CANAL (MISCELLANEOUS) ×2 IMPLANT
CEMENT BONE SIMPLEX SPEEDSET (Cement) ×2 IMPLANT
CEMENT RESTRICTOR BONE PREP ST (Miscellaneous) ×1 IMPLANT
CHLORAPREP W/TINT 26 (MISCELLANEOUS) ×4 IMPLANT
COVER SURGICAL LIGHT HANDLE (MISCELLANEOUS) ×2 IMPLANT
DERMABOND ADVANCED (GAUZE/BANDAGES/DRESSINGS) ×2
DERMABOND ADVANCED .7 DNX12 (GAUZE/BANDAGES/DRESSINGS) ×2 IMPLANT
DRAPE 3/4 80X56 (DRAPES) ×4 IMPLANT
DRAPE HIP W/POCKET STRL (MISCELLANEOUS) ×2 IMPLANT
DRAPE IMP U-DRAPE 54X76 (DRAPES) ×2 IMPLANT
DRAPE INCISE IOBAN 66X45 STRL (DRAPES) ×2 IMPLANT
DRAPE INCISE IOBAN 85X60 (DRAPES) ×2 IMPLANT
DRAPE POUCH INSTRU U-SHP 10X18 (DRAPES) ×2 IMPLANT
DRAPE SURG 17X11 SM STRL (DRAPES) ×2 IMPLANT
DRAPE U-SHAPE 47X51 STRL (DRAPES) ×2 IMPLANT
DRESSING AQUACEL AG SP 3.5X10 (GAUZE/BANDAGES/DRESSINGS) IMPLANT
DRSG AQUACEL AG ADV 3.5X 6 (GAUZE/BANDAGES/DRESSINGS) IMPLANT
DRSG AQUACEL AG ADV 3.5X10 (GAUZE/BANDAGES/DRESSINGS) ×1 IMPLANT
DRSG AQUACEL AG SP 3.5X10 (GAUZE/BANDAGES/DRESSINGS)
ELECT BLADE TIP CTD 4 INCH (ELECTRODE) ×2 IMPLANT
ELECT REM PT RETURN 15FT ADLT (MISCELLANEOUS) ×2 IMPLANT
FEMORAL HEAD LFIT V40 28MM PL0 (Orthopedic Implant) ×1 IMPLANT
GLOVE SRG 8 PF TXTR STRL LF DI (GLOVE) ×1 IMPLANT
GLOVE SURG ORTHO LTX SZ8 (GLOVE) ×4 IMPLANT
GLOVE SURG UNDER POLY LF SZ8 (GLOVE) ×2
GOWN STRL REUS W/TWL XL LVL3 (GOWN DISPOSABLE) ×2 IMPLANT
HANDPIECE INTERPULSE COAX TIP (DISPOSABLE) ×2
HEAD COMP BIPOLAR 28X45 (Head) ×1 IMPLANT
HOLDER FOLEY CATH W/STRAP (MISCELLANEOUS) ×2 IMPLANT
HOOD PEEL AWAY FLYTE STAYCOOL (MISCELLANEOUS) ×6 IMPLANT
IRRIGATION SURGIPHOR STRL (IV SOLUTION) IMPLANT
KIT BASIN OR (CUSTOM PROCEDURE TRAY) ×2 IMPLANT
KIT TURNOVER KIT A (KITS) ×2 IMPLANT
MANIFOLD NEPTUNE II (INSTRUMENTS) ×2 IMPLANT
MARKER SKIN DUAL TIP RULER LAB (MISCELLANEOUS) ×2 IMPLANT
NDL 18GX1X1/2 (RX/OR ONLY) (NEEDLE) ×1 IMPLANT
NEEDLE 18GX1X1/2 (RX/OR ONLY) (NEEDLE) ×2 IMPLANT
NS IRRIG 1000ML POUR BTL (IV SOLUTION) ×2 IMPLANT
PACK TOTAL JOINT (CUSTOM PROCEDURE TRAY) ×2 IMPLANT
PRESSURIZER FEMORAL UNIV (MISCELLANEOUS) ×1 IMPLANT
RETRACTOR YANK SUCT EIGR SABER (INSTRUMENTS) ×2 IMPLANT
RETRIEVER SUT HEWSON (MISCELLANEOUS) ×1 IMPLANT
SEALER BIPOLAR AQUA 6.0 (INSTRUMENTS) ×2 IMPLANT
SET HNDPC FAN SPRY TIP SCT (DISPOSABLE) ×1 IMPLANT
SET INTERPULSE LAVAGE W/TIP (ORTHOPEDIC DISPOSABLE SUPPLIES) ×2 IMPLANT
SLEEVE STOCKINETTE LIMB 4X8 (MISCELLANEOUS) IMPLANT
SLEEVE STOCKINETTE LIMB 6X9 (MISCELLANEOUS) IMPLANT
SPACER CENTRAL ACCOLADE 4/5X14 (Spacer) ×1 IMPLANT
SPONGE T-LAP 18X18 ~~LOC~~+RFID (SPONGE) ×4 IMPLANT
STAPLER VISISTAT 35W (STAPLE) ×2 IMPLANT
STEM HIP ACCOLADE SZ4 35X137 (Stem) ×1 IMPLANT
SUCTION FRAZIER HANDLE 10FR (MISCELLANEOUS) ×2
SUCTION TUBE FRAZIER 10FR DISP (MISCELLANEOUS) ×1 IMPLANT
SUT ETHIBOND 2 V 37 (SUTURE) ×2 IMPLANT
SUT MNCRL AB 3-0 PS2 18 (SUTURE) ×2 IMPLANT
SUT VIC AB 0 CT1 27 (SUTURE) ×4
SUT VIC AB 0 CT1 27XBRD ANBCTR (SUTURE) ×2 IMPLANT
SUT VIC AB 1 CT1 27 (SUTURE) ×4
SUT VIC AB 1 CT1 27XBRD ANBCTR (SUTURE) ×2 IMPLANT
SUT VIC AB 2-0 CT1 27 (SUTURE) ×8
SUT VIC AB 2-0 CT1 TAPERPNT 27 (SUTURE) ×2 IMPLANT
SYR 20ML LL LF (SYRINGE) ×2 IMPLANT
TOWEL GREEN STERILE (TOWEL DISPOSABLE) ×2 IMPLANT
TOWER CARTRIDGE SMART MIX (DISPOSABLE) ×2 IMPLANT
TRAY FOLEY MTR SLVR 16FR STAT (SET/KITS/TRAYS/PACK) ×2 IMPLANT
TUBE SUCT ARGYLE STRL (TUBING) ×2 IMPLANT
WATER STERILE IRR 1000ML POUR (IV SOLUTION) ×2 IMPLANT

## 2021-10-06 NOTE — Transfer of Care (Signed)
Immediate Anesthesia Transfer of Care Note  Patient: Tiffany Thornton  Procedure(s) Performed: ARTHROPLASTY BIPOLAR HIP (HEMIARTHROPLASTY) (Right: Hip)  Patient Location: PACU  Anesthesia Type:General  Level of Consciousness: awake and patient cooperative  Airway & Oxygen Therapy: Patient Spontanous Breathing and Patient connected to face mask oxygen  Post-op Assessment: Report given to RN and Post -op Vital signs reviewed and stable  Post vital signs: Reviewed and stable  Last Vitals:  Vitals Value Taken Time  BP    Temp    Pulse    Resp    SpO2      Last Pain:  Vitals:   10/06/21 1542  TempSrc:   PainSc: 7          Complications: No notable events documented.

## 2021-10-06 NOTE — Consult Note (Signed)
ORTHOPAEDIC CONSULTATION  REQUESTING PHYSICIAN: Teddy Spike, DO  Chief Complaint: Right hip fracture  HPI: Tiffany Thornton is a 87 y.o. female who sustained a fall at home this morning she has pain in the right leg following the fall x-rays emergency room demonstrated displaced femoral neck fracture.  She has at baseline difficulty with ambulation due to a prior stroke and uses a walker to assist with gait and balance.  She denies any distal numbness and tingling.  She lives in a independent living facility and goes on frequent walks with a walker during the day.  Past Medical History:  Diagnosis Date   Hyperlipidemia    Hypothyroid    Stroke [facility])    Past Surgical History:  Procedure Laterality Date   ABDOMINAL HYSTERECTOMY     APPENDECTOMY     CHOLECYSTECTOMY     CRANIOTOMY N/A 04/30/2018   Procedure: CRANIOTOMY HEMATOMA EVACUATION SUBDURAL;  Surgeon: Julio Sicks, MD;  Location: [facility] OR;  Service: Neurosurgery;  Laterality: N/A;  suboccipital craniectomy for hematoma   Social History   Socioeconomic History   Marital status: Widowed    Spouse name: Not on file   Number of children: Not on file   Years of education: Not on file   Highest education level: Not on file  Occupational History   Not on file  Tobacco Use   Smoking status: Some Days    Packs/day: 0.00    Years: 40.00    Pack years: 0.00    Types: Cigarettes    Start date: 05/07/1978   Smokeless tobacco: Never   Tobacco comments:    1 to 2 every other day  Vaping Use   Vaping Use: Never used  Substance and Sexual Activity   Alcohol use: Never   Drug use: Never   Sexual activity: Not Currently    Partners: Male  Other Topics Concern   Not on file  Social History Narrative   Pt lives alone    Social Determinants of Health   Financial Resource Strain: Not on file  Food Insecurity: Not on file  Transportation Needs: Not on file  Physical Activity: Not on file  Stress: Not on file  Social Connections: Not  on file   Family History  Problem Relation Age of Onset   Diabetes Mother    Lung cancer Father    Diabetes Sister    No Known Allergies   Positive ROS: All other systems have been reviewed and were otherwise negative with the exception of those mentioned in the HPI and as above.  Physical Exam: General: Alert, no acute distress Cardiovascular: No pedal edema Respiratory: No cyanosis, no use of accessory musculature Skin: Skin intact but significant bruising and ecchymosis to the bilateral lower extremities below the knees Neurologic: Sensation intact distally Psychiatric: Patient is competent for consent with normal mood and affect Lymphatic: No axillary or cervical lymphadenopathy  MUSCULOSKELETAL RLE No traumatic wounds or rash  Diffuse tenderness to the legs below the knees  No knee or ankle effusion  Sens DPN, SPN, TN intact  Motor EHL, ext, flex, evers 5/5  DP 2+, PT 2+, No significant edema  LLE No traumatic wounds or rash  Diffuse tenderness to the legs below the knees  No knee or ankle effusion  Sens DPN, SPN, TN intact  Motor EHL, ext, flex, evers 5/5  DP 2+, PT 2+, No significant edema   IMAGING: X-rays right hip and femur demonstrate displaced transcervical femoral neck fracture  Assessment:  Active Problems:   Closed right hip fracture (HCC)   Right displaced transcervical femoral neck fracture  Plan: The risks benefits and alternatives were discussed with the patient including but not limited to the risks of nonoperative treatment versus surgical intervention.  Given the patient's age, baseline mobility, and comorbidities I think she has a better candidate for hemiarthroplasty which is a shorter surgery with lower risk and better stability.  Risks of surgery were discussed including infection, bleeding, nerve injury, periprosthetic fracture, the need for revision surgery, dislocation, leg length discrepancy, blood clots, cardiopulmonary complications,  morbidity, mortality, among others, and they were willing to proceed.   Consent was signed by myself and the patient.  Right leg was marked.     Joen Laura, MD Cell [phone]

## 2021-10-06 NOTE — Anesthesia Procedure Notes (Signed)
Procedure Name: Intubation Date/Time: 10/06/2021 4:23 PM Performed by: Rosiland Oz, CRNA Pre-anesthesia Checklist: Patient identified, Emergency Drugs available, Suction available, Patient being monitored and Timeout performed Patient Re-evaluated:Patient Re-evaluated prior to induction Oxygen Delivery Method: Circle system utilized Preoxygenation: Pre-oxygenation with 100% oxygen Induction Type: IV induction Ventilation: Mask ventilation without difficulty Laryngoscope Size: Miller and 3 Grade View: Grade I Tube type: Oral Tube size: 7.0 mm Number of attempts: 1 Airway Equipment and Method: Stylet Placement Confirmation: ETT inserted through vocal cords under direct vision, positive ETCO2 and breath sounds checked- equal and bilateral Secured at: 21 cm Tube secured with: Tape Dental Injury: Teeth and Oropharynx as per pre-operative assessment

## 2021-10-06 NOTE — ED Notes (Signed)
[facility] bedside to transport pt.

## 2021-10-06 NOTE — H&P (Signed)
History and Physical    Tiffany Thornton NOI:[id number] DOB: [birth year] DOA: 10/06/2021  PCP: Blair Heys, MD  Patient coming from: [facility]  Chief Complaint: right hip pain  HPI: Tiffany Thornton is a 87 y.o. female with medical history significant of hypothyroidism, HTN, HLD, anxiety, ICH. Presenting with right hip pain after a fall. She was in her normal state of health until this morning. She got up from bed this morning and was trying to move to the living room. As she was walking, she lost her balance and fell on her right side. She did not hit her head. She did not suffer from LOC. She normally uses a walker, but was not using it this morning. She was unable to get up, so she crawled back to her bed. She then called for EMS. She denies any other aggravating or alleviating factors.   Of note, her last fall was 2.5 years ago. She has not noticed any new balance issues. She reports she is not 100% compliant with her walker use.  ED Course: She was found to have a femoral neck fracture. Ortho was consulted. Rec'd admission to [facility] for surgery today. [facility] was called for admission.   Review of Systems:  Denies CP, abdominal pain, dyspnea, N/V/D, syncopal episodes, seizure-like activity. Review of systems is otherwise negative for all not mentioned in HPI.   PMHx Past Medical History:  Diagnosis Date   Hyperlipidemia    Hypothyroid    Stroke [facility])     PSHx Past Surgical History:  Procedure Laterality Date   ABDOMINAL HYSTERECTOMY     APPENDECTOMY     CHOLECYSTECTOMY     CRANIOTOMY N/A 04/30/2018   Procedure: CRANIOTOMY HEMATOMA EVACUATION SUBDURAL;  Surgeon: Julio Sicks, MD;  Location: [facility] OR;  Service: Neurosurgery;  Laterality: N/A;  suboccipital craniectomy for hematoma    SocHx  reports that she has been smoking cigarettes. She started smoking about 43 years ago. She has never used smokeless tobacco. She reports that she does not drink alcohol and does not use drugs.  No Known  Allergies  FamHx Family History  Problem Relation Age of Onset   Diabetes Mother    Lung cancer Father    Diabetes Sister     Prior to Admission medications   Medication Sig Start Date End Date Taking? Authorizing Provider  amLODipine (NORVASC) 10 MG tablet Take 1 tablet (10 mg total) by mouth daily. 05/22/18   Angiulli, Mcarthur Rossetti, PA-C  aspirin 81 MG tablet Take 1 tablet (81 mg total) by mouth daily. 06/19/18   Ihor Austin, NP  citalopram (CELEXA) 20 MG tablet Take 20 mg by mouth daily. 03/27/20   [provider]  hydrALAZINE (APRESOLINE) 25 MG tablet Take 1 tablet (25 mg total) by mouth every 8 (eight) hours. 05/22/18   Angiulli, Mcarthur Rossetti, PA-C  hydrochlorothiazide (MICROZIDE) 12.5 MG capsule Take 1 capsule (12.5 mg total) by mouth daily. 05/22/18   Angiulli, Mcarthur Rossetti, PA-C  levothyroxine (SYNTHROID, LEVOTHROID) 100 MCG tablet Take 1 tablet (100 mcg total) by mouth daily before breakfast. 05/22/18   Angiulli, Mcarthur Rossetti, PA-C  lisinopril (PRINIVIL,ZESTRIL) 20 MG tablet Take 1 tablet (20 mg total) by mouth 2 (two) times daily. 05/22/18   Angiulli, Mcarthur Rossetti, PA-C  metoprolol tartrate (LOPRESSOR) 25 MG tablet Take 1 tablet (25 mg total) by mouth 2 (two) times daily. 05/22/18   Angiulli, Mcarthur Rossetti, PA-C  Multiple Vitamin (MULTIVITAMIN WITH MINERALS) TABS tablet Take 1 tablet by mouth daily.  [provider]  rosuvastatin (CRESTOR) 10 MG tablet Take 10 mg by mouth daily.    [provider]    Physical Exam: Vitals:   10/06/21 0945 10/06/21 1000 10/06/21 1030 10/06/21 1045  BP: 131/63  (!) 127/53 (!) 109/56  Pulse: 69 68  61  Resp:   (!) 21 (!) 21  Temp:      TempSrc:      SpO2: 93% 95%  92%  Weight:      Height:        General: 87 y.o. female resting in bed in NAD Eyes: PERRL, normal sclera ENMT: Nares patent w/o discharge, orophaynx clear, dentition normal, ears w/o discharge/lesions/ulcers Neck: Supple, trachea midline Cardiovascular: brady, +S1, S2, no  m/g/r, equal pulses throughout Respiratory: CTABL, no w/r/r, normal WOB GI: BS+, NDNT, no masses noted, no organomegaly noted MSK: No e/c/c; limited ROM of right hip d/t pain Skin: No rashes, bruises, ulcerations noted Neuro: A&O x 3, no focal deficits Psyc: Appropriate interaction and affect, calm/cooperative  Labs on Admission: I have personally reviewed following labs and imaging studies  CBC: Recent Labs  Lab 10/06/21 0925  WBC 11.5*  NEUTROABS 8.5*  HGB 12.5  HCT 37.2  MCV 91.6  PLT 324   Basic Metabolic Panel: Recent Labs  Lab 10/06/21 0925  NA 129*  K 4.9  CL 97*  CO2 26  GLUCOSE 139*  BUN 17  CREATININE 0.84  CALCIUM 9.4   GFR: Estimated Creatinine Clearance: 38.2 mL/min (by C-G formula based on SCr of 0.84 mg/dL). Liver Function Tests: No results for input(s): AST, ALT, ALKPHOS, BILITOT, PROT, ALBUMIN in the last 168 hours. No results for input(s): LIPASE, AMYLASE in the last 168 hours. No results for input(s): AMMONIA in the last 168 hours. Coagulation Profile: No results for input(s): INR, PROTIME in the last 168 hours. Cardiac Enzymes: No results for input(s): CKTOTAL, CKMB, CKMBINDEX, TROPONINI in the last 168 hours. BNP (last 3 results) No results for input(s): PROBNP in the last 8760 hours. HbA1C: No results for input(s): HGBA1C in the last 72 hours. CBG: No results for input(s): GLUCAP in the last 168 hours. Lipid Profile: No results for input(s): CHOL, HDL, LDLCALC, TRIG, CHOLHDL, LDLDIRECT in the last 72 hours. Thyroid Function Tests: No results for input(s): TSH, T4TOTAL, FREET4, T3FREE, THYROIDAB in the last 72 hours. Anemia Panel: No results for input(s): VITAMINB12, FOLATE, FERRITIN, TIBC, IRON, RETICCTPCT in the last 72 hours. Urine analysis:    Component Value Date/Time   COLORURINE STRAW (A) 05/09/2018 2013   APPEARANCEUR CLEAR 05/09/2018 2013   LABSPEC 1.006 05/09/2018 2013   PHURINE 8.0 05/09/2018 2013   GLUCOSEU NEGATIVE  05/09/2018 2013   HGBUR NEGATIVE 05/09/2018 2013   BILIRUBINUR NEGATIVE 05/09/2018 2013   KETONESUR NEGATIVE 05/09/2018 2013   PROTEINUR NEGATIVE 05/09/2018 2013   NITRITE NEGATIVE 05/09/2018 2013   LEUKOCYTESUR NEGATIVE 05/09/2018 2013    Radiological Exams on Admission: DG Chest 2 View  Result Date: 10/06/2021 CLINICAL DATA:  Fall, right hip pain, initial encounter. EXAM: CHEST - 2 VIEW COMPARISON:  04/30/2018. FINDINGS: Patient is rotated. Trachea is midline. Heart size stable. Thoracic aorta is calcified. Biapical pleuroparenchymal scarring. Lungs are otherwise clear. No pleural fluid. Degenerative changes in the spine. IMPRESSION: 1. No acute findings. 2.  Aortic atherosclerosis (ICD10-I70.0). Electronically Signed   By: Leanna Battles M.D.   On: 10/06/2021 09:27   DG Tibia/Fibula Right  Result Date: 10/06/2021 CLINICAL DATA:  Fall, pain EXAM: DG HIP (WITH OR  WITHOUT PELVIS) 2-3V RIGHT; RIGHT FEMUR 2 VIEWS; RIGHT TIBIA AND FIBULA - 2 VIEW COMPARISON:  None. FINDINGS: Osteopenia. There is an impacted transcervical fracture of the right femoral neck. No fracture or dislocation of the bony pelvis or partially imaged left hip. No fracture or dislocation of the distal right femur. Vascular calcinosis. No fracture or dislocation of the right tibia or fibula. IMPRESSION: 1. Impacted transcervical fracture of the right femoral neck. 2. No fracture or dislocation of the distal right femur. 3. No fracture or dislocation of the right tibia or fibula. 4. Osteopenia. Electronically Signed   By: Jearld Lesch M.D.   On: 10/06/2021 09:19   CT HEAD WO CONTRAST ( )  Result Date: 10/06/2021 CLINICAL DATA:  87 year old female with history of trauma from a fall with injury to the head. EXAM: CT HEAD WITHOUT CONTRAST CT CERVICAL SPINE WITHOUT CONTRAST TECHNIQUE: Multidetector CT imaging of the head and cervical spine was performed following the standard protocol without intravenous contrast. Multiplanar CT  image reconstructions of the cervical spine were also generated. COMPARISON:  Head CT 12/16/2017. FINDINGS: CT HEAD FINDINGS Brain: Mild cerebral atrophy. Patchy and confluent areas of decreased attenuation are noted throughout the deep and periventricular white matter of the cerebral hemispheres bilaterally, compatible with chronic microvascular ischemic disease. Volume loss and focal areas of low attenuation within the right cerebellar hemisphere where there is also a dystrophic calcification, presumably areas of encephalomalacia/gliosis from prior surgery. No evidence of acute infarction, hemorrhage, hydrocephalus, extra-axial collection or mass lesion/mass effect. Vascular: No hyperdense vessel or unexpected calcification. Skull: Status post suboccipital craniectomy. Negative for fracture or focal lesion. Sinuses/Orbits: No acute finding. Other: None. CT CERVICAL SPINE FINDINGS Alignment: Normal. Skull base and vertebrae: No acute fracture. No primary bone lesion or focal pathologic process. Soft tissues and spinal canal: No prevertebral fluid or swelling. No visible canal hematoma. Disc levels: Mild multilevel degenerative disc disease, most evident at C5-C6. Mild multilevel facet arthropathy. Upper chest: Negative. Other: None. IMPRESSION: 1. No evidence of significant acute traumatic injury to the skull, brain or cervical spine. 2. Mild cerebral atrophy with chronic microvascular ischemic changes in the cerebral white matter and postoperative changes in the right cerebellar hemisphere related to prior suboccipital craniectomy and evacuation of right cerebellar hemorrhage. 3. Mild multilevel degenerative disc disease and cervical spondylosis, as above. Electronically Signed   By: Trudie Reed M.D.   On: 10/06/2021 09:31   CT Cervical Spine Wo Contrast  Result Date: 10/06/2021 CLINICAL DATA:  87 year old female with history of trauma from a fall with injury to the head. EXAM: CT HEAD WITHOUT CONTRAST CT  CERVICAL SPINE WITHOUT CONTRAST TECHNIQUE: Multidetector CT imaging of the head and cervical spine was performed following the standard protocol without intravenous contrast. Multiplanar CT image reconstructions of the cervical spine were also generated. COMPARISON:  Head CT 12/16/2017. FINDINGS: CT HEAD FINDINGS Brain: Mild cerebral atrophy. Patchy and confluent areas of decreased attenuation are noted throughout the deep and periventricular white matter of the cerebral hemispheres bilaterally, compatible with chronic microvascular ischemic disease. Volume loss and focal areas of low attenuation within the right cerebellar hemisphere where there is also a dystrophic calcification, presumably areas of encephalomalacia/gliosis from prior surgery. No evidence of acute infarction, hemorrhage, hydrocephalus, extra-axial collection or mass lesion/mass effect. Vascular: No hyperdense vessel or unexpected calcification. Skull: Status post suboccipital craniectomy. Negative for fracture or focal lesion. Sinuses/Orbits: No acute finding. Other: None. CT CERVICAL SPINE FINDINGS Alignment: Normal. Skull base and vertebrae: No acute fracture.  No primary bone lesion or focal pathologic process. Soft tissues and spinal canal: No prevertebral fluid or swelling. No visible canal hematoma. Disc levels: Mild multilevel degenerative disc disease, most evident at C5-C6. Mild multilevel facet arthropathy. Upper chest: Negative. Other: None. IMPRESSION: 1. No evidence of significant acute traumatic injury to the skull, brain or cervical spine. 2. Mild cerebral atrophy with chronic microvascular ischemic changes in the cerebral white matter and postoperative changes in the right cerebellar hemisphere related to prior suboccipital craniectomy and evacuation of right cerebellar hemorrhage. 3. Mild multilevel degenerative disc disease and cervical spondylosis, as above. Electronically Signed   By: Trudie Reed M.D.   On: 10/06/2021 09:31    DG Hip Unilat W or Wo Pelvis 2-3 Views Right  Result Date: 10/06/2021 CLINICAL DATA:  Fall, pain EXAM: DG HIP (WITH OR WITHOUT PELVIS) 2-3V RIGHT; RIGHT FEMUR 2 VIEWS; RIGHT TIBIA AND FIBULA - 2 VIEW COMPARISON:  None. FINDINGS: Osteopenia. There is an impacted transcervical fracture of the right femoral neck. No fracture or dislocation of the bony pelvis or partially imaged left hip. No fracture or dislocation of the distal right femur. Vascular calcinosis. No fracture or dislocation of the right tibia or fibula. IMPRESSION: 1. Impacted transcervical fracture of the right femoral neck. 2. No fracture or dislocation of the distal right femur. 3. No fracture or dislocation of the right tibia or fibula. 4. Osteopenia. Electronically Signed   By: Jearld Lesch M.D.   On: 10/06/2021 09:19   DG Femur Min 2 Views Right  Result Date: 10/06/2021 CLINICAL DATA:  Fall, pain EXAM: DG HIP (WITH OR WITHOUT PELVIS) 2-3V RIGHT; RIGHT FEMUR 2 VIEWS; RIGHT TIBIA AND FIBULA - 2 VIEW COMPARISON:  None. FINDINGS: Osteopenia. There is an impacted transcervical fracture of the right femoral neck. No fracture or dislocation of the bony pelvis or partially imaged left hip. No fracture or dislocation of the distal right femur. Vascular calcinosis. No fracture or dislocation of the right tibia or fibula. IMPRESSION: 1. Impacted transcervical fracture of the right femoral neck. 2. No fracture or dislocation of the distal right femur. 3. No fracture or dislocation of the right tibia or fibula. 4. Osteopenia. Electronically Signed   By: Jearld Lesch M.D.   On: 10/06/2021 09:19    EKG: Independently reviewed. Sinus, prolonged PR  Assessment/Plan Right femoral neck fracture Fall     - admit to inpatient, tele @ [facility]     - ortho to take to OR this afternoon     - NPO for now     - PT/OT after surgery     - TOC consult     - story is mechanical fall; hold on syncope w/u for now  HTN      - continue home regimen w/ exception  of BB when off NPO status  Anxiety     - continue home regimen when off NPO status  HLD     - continue home statin when off NPO status  Hypothyroidism     - continue home regimen when off NPO status  Prolonged PR interval/1st degree heart block     - follow up outpt     - hold BB for now  DVT prophylaxis: SCDs  Code Status: DNR  Family Communication: w/ son at bedside  Consults called: EDPA spoke with ortho (Dr. Blanchie Dessert)   Status is: Inpatient  Remains inpatient appropriate because: severity of illness  Teddy Spike DO [facility]  If 7PM-7AM, please  contact night-coverage [email or web address]  10/06/2021, 11:36 AM

## 2021-10-06 NOTE — Progress Notes (Signed)
I've discussed case with my partner Dr. Blanchie Dessert who will take over care as patient would benefit from hemiarthroplasty.

## 2021-10-06 NOTE — ED Notes (Signed)
[facility] OR called, states they wanted pt to arrive at [facility] by 2:30PM. OR to call [facility] to setup transfer.

## 2021-10-06 NOTE — ED Provider Notes (Signed)
[facility]-EMERGENCY DEPT Provider Note   CSN: [id number] Arrival date & time: 10/06/21  0754     History Chief Complaint  Patient presents with   Hip Pain    Tiffany Thornton is a 87 y.o. female with past medical history significant for spontaneous brain bleed, cognitive deficit poststroke, hypertension who presents for evaluation of fall.  Patient states she woke up approximately 6 AM.  States she got up and had a mechanical fall.  She does not think she hit her head however she is not sure.  Denies anticoagulation use.  Patient states she immediately had pain to her right hip.  Pain is diffuse from her head all the way down through her distal tib-fib.  She rates her pain a 9/10.  She denies headache, back pain, chest pain, shortness of breath, abdominal pain, paresthesias or weakness.  Denies additional aggravating or alleviating factors. Denies syncope, lightheadedness, CP, SOB prior to fall.  She does not follow with orthopedics that she knows of.  History obtained from patient and past medical records.  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     Family History  Problem Relation Age of Onset   Diabetes Mother    Lung cancer Father    Diabetes Sister     Social History   Tobacco Use   Smoking status: Some Days    Packs/day: 0.00    Years: 40.00    Pack years: 0.00    Types: Cigarettes    Start date: 05/07/1978   Smokeless tobacco: Never   Tobacco comments:    1 to 2 every other day  Vaping Use   Vaping Use: Never used  Substance Use Topics   Alcohol use: Never   Drug use: Never    Home Medications Prior to Admission medications   Medication Sig Start Date End Date Taking? Authorizing Provider  amLODipine (NORVASC) 10 MG tablet Take 1 tablet (10 mg total) by mouth daily. 05/22/18   Angiulli, Mcarthur Rossetti, PA-C  aspirin 81 MG tablet Take 1 tablet (81 mg total) by mouth daily. 06/19/18   Ihor Austin, NP  citalopram (CELEXA) 20 MG tablet Take 20 mg by mouth daily. 03/27/20   [provider]  hydrALAZINE (APRESOLINE) 25 MG tablet Take 1 tablet (25 mg total) by mouth every 8 (eight) hours. 05/22/18   Angiulli, Mcarthur Rossetti, PA-C  hydrochlorothiazide (MICROZIDE) 12.5 MG capsule Take 1 capsule (12.5 mg total) by mouth daily. 05/22/18  Angiulli, Mcarthur Rossetti, PA-C  levothyroxine (SYNTHROID, LEVOTHROID) 100 MCG tablet Take 1 tablet (100 mcg total) by mouth daily before breakfast. 05/22/18   Angiulli, Mcarthur Rossetti, PA-C  lisinopril (PRINIVIL,ZESTRIL) 20 MG tablet Take 1 tablet (20 mg total) by mouth 2 (two) times daily. 05/22/18   Angiulli, Mcarthur Rossetti, PA-C  metoprolol tartrate (LOPRESSOR) 25 MG tablet Take 1 tablet (25 mg total) by mouth 2 (two) times daily. 05/22/18   Angiulli, Mcarthur Rossetti, PA-C  Multiple Vitamin (MULTIVITAMIN WITH MINERALS) TABS tablet Take 1 tablet by mouth daily.    [provider]  rosuvastatin (CRESTOR) 10 MG tablet Take 10 mg by mouth daily.    [provider]    Allergies    Patient has no known  allergies.  Review of Systems   Review of Systems  Constitutional: Negative.   HENT: Negative.    Respiratory: Negative.    Cardiovascular: Negative.   Gastrointestinal: Negative.   Genitourinary: Negative.   Musculoskeletal:        Right hip pain, femur, tib/fib pain  Skin: Negative.   Neurological: Negative.   All other systems reviewed and are negative.  Physical Exam Updated Vital Signs BP (!) 109/56   Pulse 61   Temp 98.7 F (37.1 C) (Oral)   Resp (!) 21   Ht 5\' 6"  (1.676 m)   Wt 50.3 kg   SpO2 92%   BMI 17.92 kg/m   Physical Exam Vitals and nursing note reviewed.  Constitutional:      General: She is not in acute distress.    Appearance: She is well-developed. She is not ill-appearing, toxic-appearing or diaphoretic.  HENT:     Head: Normocephalic and atraumatic.     Nose: Nose normal.     Mouth/Throat:     Mouth: Mucous membranes are moist.  Eyes:     Pupils: Pupils are equal, round, and reactive to light.  Cardiovascular:     Rate and Rhythm: Normal rate.     Pulses:          Dorsalis pedis pulses are 2+ on the right side and 2+ on the left side.       Posterior tibial pulses are 2+ on the right side and 2+ on the left side.     Heart sounds: Normal heart sounds.  Pulmonary:     Effort: Pulmonary effort is normal. No respiratory distress.     Breath sounds: Normal breath sounds.  Abdominal:     General: Bowel sounds are normal. There is no distension.     Palpations: Abdomen is soft.     Tenderness: There is no abdominal tenderness. There is no guarding or rebound.  Musculoskeletal:        General: Tenderness, deformity and signs of injury present. No swelling. Normal range of motion.     Cervical back: Normal range of motion.     Comments: Diffuse tenderness to right hip, femur, tib-fib.  Shortening right leg with some minimal external rotation.  Pelvis stable, nontender  Skin:    General: Skin is warm and dry.     Comments: Venous stasis skin  changes to bilateral lower extremities old scarring right calf, healed, no surrounding erythema or warmth  Neurological:     General: No focal deficit present.     Mental Status: She is alert and oriented to person, place, and time.     Comments: Wiggles toes without difficulty Intact sensation bilaterally A&O x3  Psychiatric:  Mood and Affect: Mood normal.    ED Results / Procedures / Treatments   Labs (all labs ordered are listed, but only abnormal results are displayed) Labs Reviewed  CBC WITH DIFFERENTIAL/PLATELET - Abnormal; Notable for the following components:      Result Value   WBC 11.5 (*)    Neutro Abs 8.5 (*)    Monocytes Absolute 1.1 (*)    All other components within normal limits  BASIC METABOLIC PANEL - Abnormal; Notable for the following components:   Sodium 129 (*)    Chloride 97 (*)    Glucose, Bld 139 (*)    All other components within normal limits  RESP PANEL BY RT-PCR (FLU A&B, COVID) [id number]    EKG None  Radiology DG Chest 2 View  Result Date: 10/06/2021 CLINICAL DATA:  Fall, right hip pain, initial encounter. EXAM: CHEST - 2 VIEW COMPARISON:  04/30/2018. FINDINGS: Patient is rotated. Trachea is midline. Heart size stable. Thoracic aorta is calcified. Biapical pleuroparenchymal scarring. Lungs are otherwise clear. No pleural fluid. Degenerative changes in the spine. IMPRESSION: 1. No acute findings. 2.  Aortic atherosclerosis (ICD10-I70.0). Electronically Signed   By: Leanna Battles M.D.   On: 10/06/2021 09:27   DG Tibia/Fibula Right  Result Date: 10/06/2021 CLINICAL DATA:  Fall, pain EXAM: DG HIP (WITH OR WITHOUT PELVIS) 2-3V RIGHT; RIGHT FEMUR 2 VIEWS; RIGHT TIBIA AND FIBULA - 2 VIEW COMPARISON:  None. FINDINGS: Osteopenia. There is an impacted transcervical fracture of the right femoral neck. No fracture or dislocation of the bony pelvis or partially imaged left hip. No fracture or dislocation of the distal right femur. Vascular calcinosis. No  fracture or dislocation of the right tibia or fibula. IMPRESSION: 1. Impacted transcervical fracture of the right femoral neck. 2. No fracture or dislocation of the distal right femur. 3. No fracture or dislocation of the right tibia or fibula. 4. Osteopenia. Electronically Signed   By: Jearld Lesch M.D.   On: 10/06/2021 09:19   CT HEAD WO CONTRAST ( )  Result Date: 10/06/2021 CLINICAL DATA:  87 year old female with history of trauma from a fall with injury to the head. EXAM: CT HEAD WITHOUT CONTRAST CT CERVICAL SPINE WITHOUT CONTRAST TECHNIQUE: Multidetector CT imaging of the head and cervical spine was performed following the standard protocol without intravenous contrast. Multiplanar CT image reconstructions of the cervical spine were also generated. COMPARISON:  Head CT 12/16/2017. FINDINGS: CT HEAD FINDINGS Brain: Mild cerebral atrophy. Patchy and confluent areas of decreased attenuation are noted throughout the deep and periventricular white matter of the cerebral hemispheres bilaterally, compatible with chronic microvascular ischemic disease. Volume loss and focal areas of low attenuation within the right cerebellar hemisphere where there is also a dystrophic calcification, presumably areas of encephalomalacia/gliosis from prior surgery. No evidence of acute infarction, hemorrhage, hydrocephalus, extra-axial collection or mass lesion/mass effect. Vascular: No hyperdense vessel or unexpected calcification. Skull: Status post suboccipital craniectomy. Negative for fracture or focal lesion. Sinuses/Orbits: No acute finding. Other: None. CT CERVICAL SPINE FINDINGS Alignment: Normal. Skull base and vertebrae: No acute fracture. No primary bone lesion or focal pathologic process. Soft tissues and spinal canal: No prevertebral fluid or swelling. No visible canal hematoma. Disc levels: Mild multilevel degenerative disc disease, most evident at C5-C6. Mild multilevel facet arthropathy. Upper chest: Negative.  Other: None. IMPRESSION: 1. No evidence of significant acute traumatic injury to the skull, brain or cervical spine. 2. Mild cerebral atrophy with chronic microvascular ischemic changes in the cerebral white matter and postoperative  changes in the right cerebellar hemisphere related to prior suboccipital craniectomy and evacuation of right cerebellar hemorrhage. 3. Mild multilevel degenerative disc disease and cervical spondylosis, as above. Electronically Signed   By: Trudie Reed M.D.   On: 10/06/2021 09:31   CT Cervical Spine Wo Contrast  Result Date: 10/06/2021 CLINICAL DATA:  87 year old female with history of trauma from a fall with injury to the head. EXAM: CT HEAD WITHOUT CONTRAST CT CERVICAL SPINE WITHOUT CONTRAST TECHNIQUE: Multidetector CT imaging of the head and cervical spine was performed following the standard protocol without intravenous contrast. Multiplanar CT image reconstructions of the cervical spine were also generated. COMPARISON:  Head CT 12/16/2017. FINDINGS: CT HEAD FINDINGS Brain: Mild cerebral atrophy. Patchy and confluent areas of decreased attenuation are noted throughout the deep and periventricular white matter of the cerebral hemispheres bilaterally, compatible with chronic microvascular ischemic disease. Volume loss and focal areas of low attenuation within the right cerebellar hemisphere where there is also a dystrophic calcification, presumably areas of encephalomalacia/gliosis from prior surgery. No evidence of acute infarction, hemorrhage, hydrocephalus, extra-axial collection or mass lesion/mass effect. Vascular: No hyperdense vessel or unexpected calcification. Skull: Status post suboccipital craniectomy. Negative for fracture or focal lesion. Sinuses/Orbits: No acute finding. Other: None. CT CERVICAL SPINE FINDINGS Alignment: Normal. Skull base and vertebrae: No acute fracture. No primary bone lesion or focal pathologic process. Soft tissues and spinal canal: No  prevertebral fluid or swelling. No visible canal hematoma. Disc levels: Mild multilevel degenerative disc disease, most evident at C5-C6. Mild multilevel facet arthropathy. Upper chest: Negative. Other: None. IMPRESSION: 1. No evidence of significant acute traumatic injury to the skull, brain or cervical spine. 2. Mild cerebral atrophy with chronic microvascular ischemic changes in the cerebral white matter and postoperative changes in the right cerebellar hemisphere related to prior suboccipital craniectomy and evacuation of right cerebellar hemorrhage. 3. Mild multilevel degenerative disc disease and cervical spondylosis, as above. Electronically Signed   By: Trudie Reed M.D.   On: 10/06/2021 09:31   DG Hip Unilat W or Wo Pelvis 2-3 Views Right  Result Date: 10/06/2021 CLINICAL DATA:  Fall, pain EXAM: DG HIP (WITH OR WITHOUT PELVIS) 2-3V RIGHT; RIGHT FEMUR 2 VIEWS; RIGHT TIBIA AND FIBULA - 2 VIEW COMPARISON:  None. FINDINGS: Osteopenia. There is an impacted transcervical fracture of the right femoral neck. No fracture or dislocation of the bony pelvis or partially imaged left hip. No fracture or dislocation of the distal right femur. Vascular calcinosis. No fracture or dislocation of the right tibia or fibula. IMPRESSION: 1. Impacted transcervical fracture of the right femoral neck. 2. No fracture or dislocation of the distal right femur. 3. No fracture or dislocation of the right tibia or fibula. 4. Osteopenia. Electronically Signed   By: Jearld Lesch M.D.   On: 10/06/2021 09:19   DG Femur Min 2 Views Right  Result Date: 10/06/2021 CLINICAL DATA:  Fall, pain EXAM: DG HIP (WITH OR WITHOUT PELVIS) 2-3V RIGHT; RIGHT FEMUR 2 VIEWS; RIGHT TIBIA AND FIBULA - 2 VIEW COMPARISON:  None. FINDINGS: Osteopenia. There is an impacted transcervical fracture of the right femoral neck. No fracture or dislocation of the bony pelvis or partially imaged left hip. No fracture or dislocation of the distal right femur.  Vascular calcinosis. No fracture or dislocation of the right tibia or fibula. IMPRESSION: 1. Impacted transcervical fracture of the right femoral neck. 2. No fracture or dislocation of the distal right femur. 3. No fracture or dislocation of the right tibia or  fibula. 4. Osteopenia. Electronically Signed   By: Jearld Lesch M.D.   On: 10/06/2021 09:19    Procedures Procedures   Medications Ordered in ED Medications  0.9 %  sodium chloride infusion (has no administration in time range)  fentaNYL (SUBLIMAZE) injection 50 mcg (50 mcg Intravenous Given 10/06/21 0930)  ondansetron (ZOFRAN) injection 4 mg (4 mg Intravenous Given 10/06/21 0929)  fentaNYL (SUBLIMAZE) injection 50 mcg (50 mcg Intravenous Given 10/06/21 1027)    ED Course  I have reviewed the triage vital signs and the nursing notes.  Pertinent labs & imaging results that were available during my care of the patient were reviewed by me and considered in my medical decision making (see chart for details).  Here for evaluation after mechanical fall.  She is unsure if she hit her head.  She is afebrile, nonseptic, not ill-appearing.  She is ANO x4.  Does have tenderness diffusely to her right lower extremity.  Has some shortening and mildly externally rotated at the right hip.  She is neurovascularly intact.  She has no overlying skin changes.  Plan on labs, imaging.  Suspect right hip fracture.  Labs and imaging personally reviewed and interpreted:  Right impacted femoral neck fracture CT head without significant findings CT cervical without significant findings X-ray right tib-fib without significant findings Chest x-ray without significant findings CBC leukocytosis at 11.5, no infectious symptoms Metabolic panel mild hyponatremia at 129 COVID, flu negative  Patient reassessed.  Pain controlled.  Discussed with family.  Will be admitted for further  CONSULT with Alfonse Alpers, APP with orthopedic surgery. Likely surgery at [facility]  this afternoon  CONSULT with Dr. Ronaldo Miyamoto with [facility] agrees who agrees to evaluate patient for admission  Discussed plan with patient, family in room.  Agreeable for admission.  The patient appears reasonably stabilized for admission considering the current resources, flow, and capabilities available in the ED at this time, and I doubt any other [facility] requiring further screening and/or treatment in the ED prior to admission.     MDM Rules/Calculators/A&P                            Final Clinical Impression(s) / ED Diagnoses Final diagnoses:  Fall, initial encounter  Closed displaced fracture of right femoral neck (HCC)  Hyponatremia    Rx / DC Orders ED Discharge Orders     None        Yovani Cogburn A, PA-C 10/06/21 1247    Horton, Clabe Seal, DO 10/06/21 1436

## 2021-10-06 NOTE — ED Triage Notes (Signed)
Pt BIB EMS from [facility] c/o trip and fell while walking. Pt states "I dreamt I was walking and walked from my room to my living room". Not on thinners, no LOC, ao x 4. Rt hip pain, + tenderness, + shortening.   EMS vs pta: 151/60 HR 58 96% RA

## 2021-10-06 NOTE — Anesthesia Preprocedure Evaluation (Signed)
Anesthesia Evaluation  Patient identified by MRN, date of birth, ID band Patient awake    Reviewed: Allergy & Precautions, NPO status , Patient's Chart, lab work & pertinent test results  Airway Mallampati: II  TM Distance: >3 FB     Dental  (+) Dental Advisory Given   Pulmonary Current Smoker,    breath sounds clear to auscultation       Cardiovascular hypertension, Pt. on medications and Pt. on home beta blockers  Rhythm:Regular Rate:Normal     Neuro/Psych CVA    GI/Hepatic negative GI ROS, Neg liver ROS,   Endo/Other  Hypothyroidism   Renal/GU negative Renal ROS     Musculoskeletal   Abdominal   Peds  Hematology negative hematology ROS (+)   Anesthesia Other Findings   Reproductive/Obstetrics                             Lab Results  Component Value Date   WBC 11.5 (H) 10/06/2021   HGB 12.5 10/06/2021   HCT 37.2 10/06/2021   MCV 91.6 10/06/2021   PLT 324 10/06/2021   Lab Results  Component Value Date   CREATININE 0.84 10/06/2021   BUN 17 10/06/2021   NA 129 (L) 10/06/2021   K 4.9 10/06/2021   CL 97 (L) 10/06/2021   CO2 26 10/06/2021    Anesthesia Physical Anesthesia Plan  ASA: 3  Anesthesia Plan: General   Post-op Pain Management:    Induction: Intravenous  PONV Risk Score and Plan: 2 and Dexamethasone, Ondansetron and Treatment may vary due to age or medical condition  Airway Management Planned: Oral ETT  Additional Equipment: None  Intra-op Plan:   Post-operative Plan:   Informed Consent: I have reviewed the patients History and Physical, chart, labs and discussed the procedure including the risks, benefits and alternatives for the proposed anesthesia with the patient or authorized representative who has indicated his/her understanding and acceptance.     Dental advisory given  Plan Discussed with: CRNA  Anesthesia Plan Comments:          Anesthesia Quick Evaluation

## 2021-10-06 NOTE — Op Note (Signed)
10/06/2021  6:16 PM  PATIENT:  Tiffany Thornton   MRN: [id number]  PRE-OPERATIVE DIAGNOSIS:  right hip fracture  POST-OPERATIVE DIAGNOSIS:  right hip fracture  PROCEDURE:  Procedure(s): ARTHROPLASTY BIPOLAR HIP (HEMIARTHROPLASTY)  PREOPERATIVE INDICATIONS:  Tiffany Thornton is an 87 y.o. female who was admitted 10/06/2021 with a diagnosis of <principal problem not specified> and elected for surgical management.  The risks benefits and alternatives were discussed with the patient including but not limited to the risks of nonoperative treatment, versus surgical intervention including infection, bleeding, nerve injury, periprosthetic fracture, the need for revision surgery, dislocation, leg length discrepancy, blood clots, cardiopulmonary complications, morbidity, mortality, among others, and they were willing to proceed.  Predicted outcome is good, although there will be at least a six to nine month expected recovery.   OPERATIVE REPORT     SURGEON:  Charlies Constable, MD    ASSISTANT SURGEON:  Fredonia Highland, MD  necessary for the timely completion of procedure given no other assistant available    ANESTHESIA:  general  ESTIMATED BLOOD LOSS: 456YB    COMPLICATIONS:  None     COMPONENTS:  Implant Name Type Inv. Item Serial No. Manufacturer Lot No. LRB No. Used Action  KIT BONE PREP STRYKER - [id number] Miscellaneous KIT BONE PREP STRYKER  STRYKER INSTRUMENTS [id number] Right 1 Implanted  CEMENT BONE SIMPLEX SPEEDSET - [id number] Cement CEMENT BONE SIMPLEX SPEEDSET  STRYKER ORTHOP[employer] [id number] Right 1 Implanted  CEMENT BONE SIMPLEX SPEEDSET - [id number] Cement CEMENT BONE SIMPLEX SPEEDSET  STRYKER ORTHOPEDICS [id number] Right 1 Implanted  STEM HIP ACCOLADE SZ4 35X137 - [id number] Stem STEM HIP ACCOLADE SZ4 35X137  STRYKER ORTHOPED[employer] [id number] Right 1 Implanted  SPACER CENTRAL ACCOLADE 4/5X14 - [id number] Spacer SPACER CENTRAL ACCOLADE 4/5X14  STRYKER ORTHOPEDICS [id number] Right 1 Implanted  FEMORAL HEAD LFIT V40  28MM PL0 - [id number] Orthopedic Implant FEMORAL HEAD LFIT V40 28MM PL0  STRYKER ORTHOPEDICS [id number] Right 1 Implanted  HEAD COMP BIPOLAR 28X45 - [id number] Head HEAD COMP BIPOLAR 28X45  STRYKER ORTHOPEDICS [id number] Right 1 Implanted      PROCEDURE IN DETAIL: The patient was met in the holding area and identified.  The appropriate hip  was marked at the operative site. The patient was then transported to the OR and  placed under anesthesia.  At that point, the patient was  placed in the lateral decubitus position with the operative side up and  secured to the operating room table and all bony prominences padded. A subaxillary role was placed.    The operative lower extremity was prepped from the iliac crest to the ankle.  Sterile draping was performed.  Time out was performed prior to incision.      A routine posterolateral approach was utilized via sharp dissection  carried down to the subcutaneous tissue.  Gross bleeders were Bovie  coagulated.  The iliotibial band was identified and incised  along the length of the skin incision.  A Charnley retractor was inserted with care to protect the sciatic nerve.  With the hip internally rotated, the short external rotators  were identified. The piriformis was tagged with #2 Ethibond, and the hip capsule released in a T-type fashion, and posterior sleeve of the capsule was also tagged.  The femoral neck was exposed, and I resected the femoral neck using the appropriate jig approximately 8 mm above the lesser trochanter.  The femoral neck fracture was noted to extend distal to the femoral neck cut along the calcar but without any further propagation thus  we elected to proceed with a cemented hemiarthroplasty.    I then exposed the deep acetabulum, cleared out any tissue including the ligamentum teres.    I then prepared the proximal femur using the box cutter, Charnley awl, and then sequentially broached.  A trial utilized, and I reduced the hip, leg lengths  were assessed clinically and felt to be equal. The hip was then taken through a full range of motion, the hip was stable at full extension and 90 degrees external rotation without anterior subluxation. The hip was also stable in the position of sleep, and in neutral abduction up to 90 degrees flexion, and 90 degrees IR. The trial components were then removed.   We then prepared canal for cementation.  The cement restrictor was measured and inserted distally.  The canal was then irrigated with the pulse lavage and 3 L of normal saline.  2 bags of Simplex cement were prepared.  Using the cement gun the cement was inserted distally and the canal was filled.  We then pressurized the canal. The real implant was then inserted with anteversion of approximately 30 degrees.  We then waited for 12 minutes for the cement to be fully set.  Excess cement was removed.  A lap was placed in the acetabulum prior to cementing was also removed and the acetabulum was assessed to make sure there was no cement or bone fragments.  The hip was then reduced and taken through functional range of motion and found to have excellent stability. Leg lengths were restored.  The capsule was then repaired with #2 Ethibond., and the piriformis was repaired to the abductor tendon.  His. Excellent posterior capsular repair was achieved.   I then irrigated the hip copiously again with pulse lavage. The wounds were injected with 30cc of quarter percent Marcaine plain. The fascia and IT band was repaired with #1 vicryl, followed by 0 vicryl  fat layer followed by 2-0 Vicryl and running 3-0 Monocryl for the skin, Dermabond was applied and an Aquacel dressing was placed.  The patient was then awakened and returned to PACU in stable and satisfactory condition. There were no complications.  Post op recs: WB: WBAT with posterior hip precautions x6 weeks Abx: ancef x23 hours post op Imaging: PACU xrays Dressing: Aquacel dressing to be kept intact  until follow-up DVT prophylaxis: lovenox starting POD1 x4 weeks Follow up: 2 weeks after surgery for a wound check with Dr. Zachery Dakins at [facility].  Address: [address] [place], [place], [place] [address]  Office Phone: [phone]   Charlies Constable, MD Orthopedic Surgeon  10/06/2021 6:16 PM

## 2021-10-06 NOTE — ED Notes (Signed)
Pt to xray

## 2021-10-07 ENCOUNTER — Encounter (HOSPITAL_COMMUNITY): Payer: Self-pay | Admitting: Orthopedic Surgery

## 2021-10-07 ENCOUNTER — Inpatient Hospital Stay (HOSPITAL_COMMUNITY): Payer: Medicare Other

## 2021-10-07 DIAGNOSIS — S72001A Fracture of unspecified part of neck of right femur, initial encounter for closed fracture: Secondary | ICD-10-CM | POA: Diagnosis not present

## 2021-10-07 DIAGNOSIS — E871 Hypo-osmolality and hyponatremia: Secondary | ICD-10-CM | POA: Diagnosis not present

## 2021-10-07 LAB — BASIC METABOLIC PANEL
Anion gap: 7 (ref 5–15)
BUN: 10 mg/dL (ref 8–23)
CO2: 25 mmol/L (ref 22–32)
Calcium: 8.4 mg/dL — ABNORMAL LOW (ref 8.9–10.3)
Chloride: 98 mmol/L (ref 98–111)
Creatinine, Ser: 0.8 mg/dL (ref 0.44–1.00)
GFR, Estimated: >60 mL/min (ref >60)
Glucose, Bld: 172 mg/dL — ABNORMAL HIGH (ref 70–99)
Potassium: 4.5 mmol/L (ref 3.5–5.1)
Sodium: 130 mmol/L — ABNORMAL LOW (ref 135–145)

## 2021-10-07 LAB — OSMOLALITY: Osmolality: 281 mOsm/kg (ref 275–295)

## 2021-10-07 LAB — CBC
HCT: 30.2 % — ABNORMAL LOW (ref 36.0–46.0)
Hemoglobin: 10.2 g/dL — ABNORMAL LOW (ref 12.0–15.0)
MCH: 30.6 pg (ref 26.0–34.0)
MCHC: 33.8 g/dL (ref 30.0–36.0)
MCV: 90.7 fL (ref 80.0–100.0)
Platelets: 278 10*3/uL (ref 150–400)
RBC: 3.33 MIL/uL — ABNORMAL LOW (ref 3.87–5.11)
RDW: 13.3 % (ref 11.5–15.5)
WBC: 14.1 10*3/uL — ABNORMAL HIGH (ref 4.0–10.5)
nRBC: 0 % (ref 0.0–0.2)

## 2021-10-07 LAB — TSH: TSH: 0.489 u[IU]/mL (ref 0.350–4.500)

## 2021-10-07 MED ORDER — ASPIRIN EC 81 MG PO TBEC
81.0000 mg | DELAYED_RELEASE_TABLET | Freq: Every day | ORAL | Status: DC
  Administered 2021-10-07 – 2021-10-12 (×6): 81 mg via ORAL
  Filled 2021-10-07 (×7): qty 1

## 2021-10-07 MED ORDER — METOPROLOL TARTRATE 25 MG PO TABS
25.0000 mg | ORAL_TABLET | Freq: Two times a day (BID) | ORAL | Status: DC
  Administered 2021-10-07: 25 mg via ORAL
  Filled 2021-10-07 (×2): qty 1

## 2021-10-07 MED ORDER — ENSURE ENLIVE PO LIQD
237.0000 mL | Freq: Two times a day (BID) | ORAL | Status: DC
  Administered 2021-10-07 – 2021-10-11 (×3): 237 mL via ORAL

## 2021-10-07 MED ORDER — ADULT MULTIVITAMIN W/MINERALS CH
1.0000 | ORAL_TABLET | Freq: Every day | ORAL | Status: DC
  Administered 2021-10-07 – 2021-10-12 (×6): 1 via ORAL
  Filled 2021-10-07 (×6): qty 1

## 2021-10-07 MED ORDER — ROSUVASTATIN CALCIUM 5 MG PO TABS
10.0000 mg | ORAL_TABLET | Freq: Every day | ORAL | Status: DC
  Administered 2021-10-07 – 2021-10-11 (×5): 10 mg via ORAL
  Filled 2021-10-07 (×5): qty 2

## 2021-10-07 MED ORDER — ONDANSETRON HCL 4 MG/2ML IJ SOLN
4.0000 mg | Freq: Four times a day (QID) | INTRAMUSCULAR | Status: DC | PRN
  Administered 2021-10-07 – 2021-10-09 (×2): 4 mg via INTRAVENOUS
  Filled 2021-10-07 (×2): qty 2

## 2021-10-07 MED ORDER — LEVOTHYROXINE SODIUM 88 MCG PO TABS
88.0000 ug | ORAL_TABLET | Freq: Every day | ORAL | Status: DC
  Administered 2021-10-08: 88 ug via ORAL
  Filled 2021-10-07: qty 1

## 2021-10-07 NOTE — Progress Notes (Signed)
     Subjective:  Patient reports pain as well controlled. She tried to work with PT but difficulty with sit to stand. Eager to continue to progress and hopes to go to rehab. Deneis distal n/t. Denies pain in other joints or extremities.  Objective:   VITALS:   Vitals:   10/06/21 2009 10/07/21 0439 10/07/21 0720 10/07/21 1438  BP: (!) 136/58 (!) 127/56 (!) 127/55 (!) 128/51  Pulse: 80 74 78 82  Resp: 20 18 17 16   Temp: 98.6 F (37 C) 98.2 F (36.8 C) 97.8 F (36.6 C) 99 F (37.2 C)  TempSrc:  Oral Oral Oral  SpO2: 97% 96% 94% 98%  Weight:      Height:        Sensation intact distally Intact pulses distally Dorsiflexion/Plantar flexion intact Incision: dressing C/D/I Compartment soft   Lab Results  Component Value Date   WBC 14.1 (H) 10/07/2021   HGB 10.2 (L) 10/07/2021   HCT 30.2 (L) 10/07/2021   MCV 90.7 10/07/2021   PLT 278 10/07/2021   BMET    Component Value Date/Time   NA 130 (L) 10/07/2021 0334   K 4.5 10/07/2021 0334   CL 98 10/07/2021 0334   CO2 25 10/07/2021 0334   GLUCOSE 172 (H) 10/07/2021 0334   BUN 10 10/07/2021 0334   CREATININE 0.80 10/07/2021 0334   CALCIUM 8.4 (L) 10/07/2021 0334   GFRNONAA >60 10/07/2021 0334     Assessment/Plan: 1 Day Post-Op   Active Problems:   Closed right hip fracture (HCC)   Post op recs: WB: WBAT with posterior hip precautions x6 weeks Abx: ancef x23 hours post op Imaging: PACU xrays Dressing: Aquacel dressing to be kept intact until follow-up DVT prophylaxis: lovenox starting POD1 x4 weeks Follow up: 2 weeks after surgery for a wound check with Dr. 13/08/2021 at [facility].  Address: [address], [place], [place]  Office Phone: [phone]    Ryka Beighley A Azeneth Carbonell 10/07/2021, 2:40 PM   13/08/2021, MD Cell [phone]  Contact information:   Weekdays 7am-5pm epic message Dr. 08-01-2006, or call office for patient follow up: [phone] After  hours and holidays please check [email or web address] for group call information for [facility]

## 2021-10-07 NOTE — Progress Notes (Signed)
Initial Nutrition Assessment  DOCUMENTATION CODES:   Underweight  INTERVENTION:  -transition to appropriate for meal service with assistance -Ensure Enlive po BID, each supplement provides 350 kcal and 20 grams of protein -MVI with minerals daily  NUTRITION DIAGNOSIS:   Increased nutrient needs related to post-op healing as evidenced by estimated needs.  GOAL:   Patient will meet greater than or equal to 90% of their needs  MONITOR:   PO intake, Supplement acceptance, Labs, Weight trends, I & O's, Skin  REASON FOR ASSESSMENT:   Consult Hip fracture protocol  ASSESSMENT:   Pt with PMH significant of hypothyroidism, HTN, HLD, anxiety, ICH admitted with R femoral neck fracture s/p fall.  Pt is POD#1 hemiarthroplasty. Pt unavailable at time of RD visit, but per H&P, reported being in her usual state of health until yesterday morning when she fell. Discussed pt with RN.   Limited weight history available for review.   No PO intake documented.  Medications reviewed. Labs: Na 130 (L)   UOP: x24 hours EBL: x24 hours I/O: +2047ml since admit  NUTRITION - FOCUSED PHYSICAL EXAM: Unable to perform at this time  Diet Order:   Diet Order             Diet regular Room service appropriate? Yes with Assist; Fluid consistency: Thin  Diet effective now                   EDUCATION NEEDS:   No education needs have been identified at this time  Skin:  Skin Assessment: Skin Integrity Issues: Skin Integrity Issues:: Incisions Incisions: closed R hip  Last BM:  11/8  Height:   Ht Readings from Last 1 Encounters:  10/06/21 5\' 6"  (1.676 m)    Weight:   Wt Readings from Last 2 Encounters:  10/06/21 50.8 kg  04/07/20 51.7 kg    BMI:  Body mass index is 18.08 kg/m.  Estimated Nutritional Needs:   Kcal:  1300-1500  Protein:  65-75 grams  Fluid:  >1.3L/d     06/07/20., MS, RD, LDN (she/her/hers) RD pager number and weekend/on-call pager  number located in Amion.

## 2021-10-07 NOTE — Evaluation (Addendum)
Occupational Therapy Evaluation Patient Details Name: Tiffany Thornton MRN: [id number] DOB: 1933-01-12 Today's Date: 10/07/2021   History of Present Illness 87 yo female [facility] from [facility] after fall while walking. Pt sustained R displaced femoral neck fracture, s/p R hemiarthroplasty posterolateral approach. PMH includes HLD, hypothyroidism, CVA with chronic R deficits, craniotomy for evacuation of [facility] 2019, most recent fall 2.5 years ago.   Clinical Impression   Pt independent with ADLs and functional mobility at baseline, lives alone in ILF. Despite premedication before session, pt in increased pain with movement, states pain is manageable at rest. Pt mod -max A +2 for bed mobility and sit to stand transfers with RW, R sided ataxia further impeding on pt's functional mobility. Pt RLE frequently rotating inward/adducting during session, requesting adductor wedge. Educated pt on posterior hip precautions and WB status for RLE, handout already provided by PT. Pt tearful during session and anxious about postacute rehab, does not want to burden her family. Currently limited by decreased balance, activity tolerance, strength, coordination. Will continue to follow to address impairments listed above. Pt prefers CIR, however recommend SNF given pt lives in ILF, does not have assistance at d/c.     Recommendations for follow up therapy are one component of a multi-disciplinary discharge planning process, led by the attending physician.  Recommendations may be updated based on patient status, additional functional criteria and insurance authorization.   Follow Up Recommendations  Skilled nursing-short term rehab (<3 hours/day)    Assistance Recommended at Discharge    Functional Status Assessment  Patient has had a recent decline in their functional status and demonstrates the ability to make significant improvements in function in a reasonable and predictable amount  of time.  Equipment Recommendations  None recommended by OT    Recommendations for Other Services PT consult     Precautions / Restrictions Precautions Precautions: Fall;Posterior Hip Precaution Booklet Issued: No (booklet already in room from PT) Precaution Comments: reviewed no hip flexion >90 degrees, no hip IR past midline, no hip adduction past midline. Ordered abduction pillow, stacked pillows being used betwen pt legs in the meantime. Restrictions Weight Bearing Restrictions: No RLE Weight Bearing: Weight bearing as tolerated      Mobility Bed Mobility Overal bed mobility: Needs Assistance Bed Mobility: Sit to Supine;Supine to Sit     Supine to sit: +2 for physical assistance;Mod assist;HOB elevated Sit to supine: Mod assist;+2 for physical assistance;HOB elevated   General bed mobility comments: difficulty adhering to precautions, RLE internally rotates/adducts often during bed mobility    Transfers Overall transfer level: Needs assistance Equipment used: Rolling walker (2 wheels) Transfers: Sit to/from Stand Sit to Stand: Mod assist;Max assist;+2 physical assistance;+2 safety/equipment;From elevated surface           General transfer comment: initally tried sit-to-stand with RW, R knee buckling during session, reattempted transfer with knee adduction block without RW, pt able to stand x2 fatigued quickly      Balance Overall balance assessment: Needs assistance;History of Falls Sitting-balance support: No upper extremity supported;Feet supported Sitting balance-Leahy Scale: Fair Sitting balance - Comments: able to sit EOB without support Postural control: Posterior lean Standing balance support: Bilateral upper extremity supported;During functional activity;Reliant on assistive device for balance Standing balance-Leahy Scale: Poor Standing balance comment: reliant on external assist, HHA +2 or RW  ADL either performed  or assessed with clinical judgement   ADL Overall ADL's : Needs assistance/impaired Eating/Feeding: Set up;Sitting   Grooming: Set up;Sitting   Upper Body Bathing: Minimal assistance;Sitting   Lower Body Bathing: Maximal assistance;Sitting/lateral leans;Bed level   Upper Body Dressing : Sitting;Minimal assistance   Lower Body Dressing: Bed level;Sitting/lateral leans;Maximal assistance;Moderate assistance;Cueing for compensatory techniques;Adhering to hip precautions   Toilet Transfer: Maximal assistance;+2 for physical assistance;Adhering to hip precautions;Rolling walker (2 wheels);BSC/3in1;Requires drop arm   Toileting- Clothing Manipulation and Hygiene: Maximal assistance;Sit to/from stand               Vision   Vision Assessment?: No apparent visual deficits     Perception     Praxis      Pertinent Vitals/Pain Pain Assessment: 0-10 Pain Score: 8  Faces Pain Scale: Hurts whole lot Pain Location: R hip, bilat LEs Pain Descriptors / Indicators: Sore;Discomfort;Grimacing;Guarding Pain Intervention(s): Limited activity within patient's tolerance;Monitored during session;Utilized relaxation techniques;Repositioned;Premedicated before session     Hand Dominance Right   Extremity/Trunk Assessment Upper Extremity Assessment Upper Extremity Assessment: Overall WFL for tasks assessed;RUE deficits/detail RUE Deficits / Details: 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   Lower Extremity Assessment Lower Extremity Assessment: Defer to PT evaluation RLE Deficits / Details: ataxic movements, incoordinated secondary to chronic CVA; tendency towards hip adduction and flexion RLE: Unable to fully assess due to pain   Cervical / Trunk Assessment Cervical / Trunk Assessment: Kyphotic   Communication Communication Communication: No difficulties   Cognition Arousal/Alertness: Awake/alert Behavior During Therapy: WFL for tasks assessed/performed Overall Cognitive Status: No  family/caregiver present to determine baseline cognitive functioning                                 General Comments: pt tearful during session, worried about being burden to family     General Comments       Exercises     Shoulder Instructions      Home Living Family/patient expects to be discharged to:: Skilled nursing facility                                 Additional Comments: pt perseverative on CIR, however lives alone, lacks help at home      Prior Functioning/Environment Prior Level of Function : Independent/Modified Independent             Mobility Comments: uses rollator at baseline ADLs Comments: lives actively, exercises several times per week        OT Problem List: Decreased strength;Decreased range of motion;Decreased activity tolerance;Impaired balance (sitting and/or standing);Decreased knowledge of use of DME or AE;Decreased knowledge of precautions;Pain;Decreased coordination      OT Treatment/Interventions: Self-care/ADL training;Therapeutic exercise;DME and/or AE instruction;Therapeutic activities;Patient/family education;Balance training    OT Goals(Current goals can be found in the care plan section) Acute Rehab OT Goals Patient Stated Goal: return home OT Goal Formulation: With patient Time For Goal Achievement: 10/21/21 Potential to Achieve Goals: Good  OT Frequency: Min 2X/week   Barriers to D/C: Decreased caregiver support          Co-evaluation   Reason for Co-Treatment: For patient/therapist safety;To address functional/ADL transfers;Complexity of the patient's impairments (multi-system involvement) PT goals addressed during session: Mobility/safety with mobility;Balance;Proper use of DME        AM-PAC OT "6 Clicks" Daily Activity  Outcome Measure Help from another person eating meals?: None Help from another person taking care of personal grooming?: A Little Help from another person toileting,  which includes using toliet, bedpan, or urinal?: A Lot Help from another person bathing (including washing, rinsing, drying)?: A Lot Help from another person to put on and taking off regular upper body clothing?: A Lot Help from another person to put on and taking off regular lower body clothing?: Total 6 Click Score: 14   End of Session Equipment Utilized During Treatment: Gait belt;Rolling walker (2 wheels) Nurse Communication: Mobility status  Activity Tolerance: Patient tolerated treatment well;Patient limited by pain Patient left: in bed;with call bell/phone within reach;with bed alarm set  OT Visit Diagnosis: Unsteadiness on feet (R26.81);Repeated falls (R29.6);Other abnormalities of gait and mobility (R26.89);Muscle weakness (generalized) (M62.81);History of falling (Z91.81);Pain;Ataxia, unspecified (R27.0)                Time: 2025-4270 OT Time Calculation (min): 50 min Charges:  OT General Charges $OT Visit: 1 Visit OT Evaluation $OT Eval Low Complexity: 1 Low OT Treatments $Self Care/Home Management : 23-37 mins  Alfonzo Beers, OTD, OTR/L Acute Rehab [phone]   Mayer Masker 10/07/2021, 1:48 PM

## 2021-10-07 NOTE — Plan of Care (Signed)

## 2021-10-07 NOTE — Evaluation (Signed)
Physical Therapy Evaluation Patient Details Name: Tiffany Thornton MRN: [id number] DOB: Nov 26, 1933 Today's Date: 10/07/2021  History of Present Illness  87 yo female presents to [facility] on 11/9 from [facility] after fall while walking. Pt sustained R displaced femoral neck fracture, s/p R hemiarthroplasty posterolateral approach. PMH includes HLD, hypothyroidism, CVA with chronic R deficits, craniotomy for evacuation of SDH 2019, most recent fall 2.5 years ago.  Clinical Impression   Pt presents with impaired strength, R hip pain, decreased knowledge and application of hip precautions, mod-max difficulty performing mobility tasks, inability to ambulate on eval, impaired coordination RUE/LE (chronic), and decreased activity tolerance. Pt to benefit from acute PT to address deficits. Pt requiring mod-max +2 for bed mobility and repeated transfer to standing, pt with difficulty maintaining R hip in abduction in standing requiring max facilitation. Pt expressed interest in CIR, PT feels SNF is more appropriate pace for pt and given pt is from ILF may take longer to reach mod I/supervision level. PT to progress mobility as tolerated, and will continue to follow acutely.         Recommendations for follow up therapy are one component of a multi-disciplinary discharge planning process, led by the attending physician.  Recommendations may be updated based on patient status, additional functional criteria and insurance authorization.  Follow Up Recommendations Skilled nursing-short term rehab (<3 hours/day)    Assistance Recommended at Discharge Frequent or constant Supervision/Assistance  Functional Status Assessment Patient has had a recent decline in their functional status and demonstrates the ability to make significant improvements in function in a reasonable and predictable amount of time.  Equipment Recommendations  None recommended by PT    Recommendations for Other Services        Precautions / Restrictions Precautions Precautions: Fall;Posterior Hip Precaution Booklet Issued: Yes (comment) Precaution Comments: reviewed no hip flexion >90 degrees, no hip IR past midline, no hip adduction past midline. OT ordered abduction pillow, stacked pillows being used betwen pt legs in the meantime. Restrictions Weight Bearing Restrictions: No RLE Weight Bearing: Weight bearing as tolerated      Mobility  Bed Mobility Overal bed mobility: Needs Assistance Bed Mobility: Sit to Supine       Sit to supine: +2 for physical assistance;HOB elevated;Max assist   General bed mobility comments: pt sitting EOB with OT upon PT arrival to room. Max +2 for return to supine for LE lifting into bed, maintaining hip precautions, trunk lowering, boost up in bed.    Transfers Overall transfer level: Needs assistance Equipment used: Rolling walker (2 wheels);2 person hand held assist Transfers: Sit to/from Stand Sit to Stand: Mod assist;Max assist;+2 physical assistance;+2 safety/equipment;From elevated surface           General transfer comment: initially max +2 for power up, hip rise, facilitating neutral hip position via R knee medial blocking, steadying. transitioning to mod +2 with repeated standing x3, pt with tendency for posterior leaning with fatigue.    Ambulation/Gait               General Gait Details: unable today, standing tolerance x20 seconds  Stairs            Wheelchair Mobility    Modified Rankin (Stroke Patients Only)       Balance Overall balance assessment: Needs assistance;History of Falls Sitting-balance support: No upper extremity supported;Feet supported Sitting balance-Leahy Scale: Fair Sitting balance - Comments: able to sit EOB without support Postural control: Posterior lean Standing balance support: Bilateral  upper extremity supported;During functional activity;Reliant on assistive device for balance Standing balance-Leahy  Scale: Poor Standing balance comment: reliant on external assist, HHA +2 or RW                             Pertinent Vitals/Pain Pain Assessment: Faces Faces Pain Scale: Hurts even more Pain Location: R hip, bilat LEs Pain Descriptors / Indicators: Sore;Discomfort;Grimacing;Guarding Pain Intervention(s): Limited activity within patient's tolerance;Monitored during session;Repositioned    Home Living Family/patient expects to be discharged to:: Skilled nursing facility                        Prior Function Prior Level of Function : Independent/Modified Independent             Mobility Comments: pt reports using rollator for mobility, walking up to 3-4 miles a day. Pt states she stays active, enjoys exercising       Hand Dominance   Dominant Hand: Right    Extremity/Trunk Assessment   Upper Extremity Assessment Upper Extremity Assessment: Defer to OT evaluation    Lower Extremity Assessment Lower Extremity Assessment: RLE deficits/detail;Generalized weakness RLE Deficits / Details: ataxic movements, incoordinated secondary to chronic CVA; tendency towards hip adduction and flexion RLE: Unable to fully assess due to pain    Cervical / Trunk Assessment Cervical / Trunk Assessment: Kyphotic  Communication   Communication: No difficulties  Cognition Arousal/Alertness: Awake/alert Behavior During Therapy: WFL for tasks assessed/performed Overall Cognitive Status: No family/caregiver present to determine baseline cognitive functioning                                 General Comments: pt follows commands well during session, pleasant and motivated. Pt slightly lacks insight into deficits, difficulty understanding she may need SNF vs CIR.        General Comments      Exercises     Assessment/Plan    PT Assessment Patient needs continued PT services  PT Problem List Decreased strength;Decreased mobility;Decreased activity  tolerance;Decreased balance;Decreased knowledge of use of DME;Pain;Decreased coordination;Decreased safety awareness;Decreased range of motion;Decreased knowledge of precautions       PT Treatment Interventions DME instruction;Therapeutic activities;Gait training;Therapeutic exercise;Patient/family education;Balance training;Functional mobility training;Neuromuscular re-education    PT Goals (Current goals can be found in the Care Plan section)  Acute Rehab PT Goals Patient Stated Goal: back to walking PT Goal Formulation: With patient Time For Goal Achievement: 10/21/21 Potential to Achieve Goals: Good    Frequency Min 3X/week   Barriers to discharge        Co-evaluation PT/OT/SLP Co-Evaluation/Treatment: Yes Reason for Co-Treatment: For patient/therapist safety;To address functional/ADL transfers;Complexity of the patient's impairments (multi-system involvement) PT goals addressed during session: Mobility/safety with mobility;Balance;Proper use of DME         AM-PAC PT "6 Clicks" Mobility  Outcome Measure Help needed turning from your back to your side while in a flat bed without using bedrails?: A Lot Help needed moving from lying on your back to sitting on the side of a flat bed without using bedrails?: A Lot Help needed moving to and from a bed to a chair (including a wheelchair)?: Total Help needed standing up from a chair using your arms (e.g., wheelchair or bedside chair)?: Total Help needed to walk in hospital room?: Total Help needed climbing 3-5 steps with a railing? : Total 6  Click Score: 8    End of Session Equipment Utilized During Treatment: Gait belt Activity Tolerance: Patient limited by pain;Patient limited by fatigue Patient left: in bed;with call bell/phone within reach;with bed alarm set Nurse Communication: Mobility status;Other (comment) (need for hip abduction pillow) PT Visit Diagnosis: Other abnormalities of gait and mobility (R26.89);Muscle  weakness (generalized) (M62.81);Pain Pain - Right/Left: Right Pain - part of body: Hip    Time: 1101-1118 PT Time Calculation (min) (ACUTE ONLY): 17 min   Charges:   PT Evaluation $PT Eval Low Complexity: 1 Low         Caliana Spires S, PT DPT Acute Rehabilitation Services Pager [phone]  Office [phone]   Charlett Merkle E Christain [place] 10/07/2021, 12:06 PM

## 2021-10-07 NOTE — Progress Notes (Addendum)
[facility]  PROGRESS NOTE  Tiffany Thornton [id number] DOB: 02/25/1933 DOA: 10/06/2021 PCP: Gaynelle Arabian, MD   Brief HPI:   87 year old female with medical history of hypothyroidism, hypertension, hyperlipidemia, anxiety, ICH presented with right hip pain after a fall.  She was in normal state of health that morning.  She got up from the bed was trying to move to the living room.  As she was walking, lost her balance and fell on her right side. In the ED she was found to have femoral neck fracture.  Orthopedics was consulted.    Subjective   Patient seen and examined, denies any complaints.  Underwent hemiarthroplasty yesterday.   Assessment/Plan:    Right femoral neck fracture -Patient underwent hemiarthroplasty yesterday -Orthopedics has signed off -Lovenox 40 mg subcu daily for 4 weeks for DVT prophylaxis -Patient to go to skilled nursing facility for rehab  Hypertension -Blood pressure is stable -We will restart metoprolol 25 mg p.o. twice daily -Hold other medications for now  Hyperlipidemia -Continue statin  Hypothyroidism -Continue Synthroid  Hyponatremia -Mild, sodium 130 today -Check TSH, serum osmolality -Patient is on HCTZ at home; HCTZ currently on hold         Scheduled medications:    enoxaparin  40 mg Subcutaneous Q24H   feeding supplement  237 mL Oral BID BM   multivitamin with minerals  1 tablet Oral Daily     Data Reviewed:   CBG:  No results for input(s): GLUCAP in the last 168 hours.  SpO2: 98 %    Vitals:   10/06/21 2009 10/07/21 0439 10/07/21 0720 10/07/21 1438  BP: (!) 136/58 (!) 127/56 (!) 127/55 (!) 128/51  Pulse: 80 74 78 82  Resp: 20 18 17 16   Temp: 98.6 F (37 C) 98.2 F (36.8 C) 97.8 F (36.6 C) 99 F (37.2 C)  TempSrc:  Oral Oral Oral  SpO2: 97% 96% 94% 98%  Weight:      Height:         Intake/Output Summary (Last 24 hours) at 10/07/2021 1636 Last data filed at 10/07/2021 1503 Gross per 24 hour   Intake 3246.17 ml  Output 1175 ml  Net 2071.17 ml    11/08 1901 - 11/10 0700 In: 2886.2 [I.V.:2636.2] Out: [id number] [Urine:525]  Filed Weights   10/06/21 0812 10/06/21 1542  Weight: 50.3 kg 50.8 kg    Data Reviewed: Basic Metabolic Panel: Recent Labs  Lab 10/06/21 0925 10/07/21 0334  NA 129* 130*  K 4.9 4.5  CL 97* 98  CO2 26 25  GLUCOSE 139* 172*  BUN 17 10  CREATININE 0.84 0.80  CALCIUM 9.4 8.4*   Liver Function Tests: No results for input(s): AST, ALT, ALKPHOS, BILITOT, PROT, ALBUMIN in the last 168 hours. No results for input(s): LIPASE, AMYLASE in the last 168 hours. No results for input(s): AMMONIA in the last 168 hours. CBC: Recent Labs  Lab 10/06/21 0925 10/07/21 0334  WBC 11.5* 14.1*  NEUTROABS 8.5*  --   HGB 12.5 10.2*  HCT 37.2 30.2*  MCV 91.6 90.7  PLT 324 278   Cardiac Enzymes: No results for input(s): CKTOTAL, CKMB, CKMBINDEX, TROPONINI in the last 168 hours. BNP (last 3 results) No results for input(s): BNP in the last 8760 hours.  ProBNP (last 3 results) No results for input(s): PROBNP in the last 8760 hours.  CBG: No results for input(s): GLUCAP in the last 168 hours.     Radiology Reports  DG Chest 2 View  Result  Date: 10/06/2021 CLINICAL DATA:  Fall, right hip pain, initial encounter. EXAM: CHEST - 2 VIEW COMPARISON:  04/30/2018. FINDINGS: Patient is rotated. Trachea is midline. Heart size stable. Thoracic aorta is calcified. Biapical pleuroparenchymal scarring. Lungs are otherwise clear. No pleural fluid. Degenerative changes in the spine. IMPRESSION: 1. No acute findings. 2.  Aortic atherosclerosis (ICD10-I70.0). Electronically Signed   By: Lorin Picket M.D.   On: 10/06/2021 09:27   DG Tibia/Fibula Right  Result Date: 10/06/2021 CLINICAL DATA:  Fall, pain EXAM: DG HIP (WITH OR WITHOUT PELVIS) 2-3V RIGHT; RIGHT FEMUR 2 VIEWS; RIGHT TIBIA AND FIBULA - 2 VIEW COMPARISON:  None. FINDINGS: Osteopenia. There is an impacted transcervical  fracture of the right femoral neck. No fracture or dislocation of the bony pelvis or partially imaged left hip. No fracture or dislocation of the distal right femur. Vascular calcinosis. No fracture or dislocation of the right tibia or fibula. IMPRESSION: 1. Impacted transcervical fracture of the right femoral neck. 2. No fracture or dislocation of the distal right femur. 3. No fracture or dislocation of the right tibia or fibula. 4. Osteopenia. Electronically Signed   By: Delanna Ahmadi M.D.   On: 10/06/2021 09:19   CT HEAD WO CONTRAST (5MM)  Result Date: 10/06/2021 CLINICAL DATA:  87 year old female with history of trauma from a fall with injury to the head. EXAM: CT HEAD WITHOUT CONTRAST CT CERVICAL SPINE WITHOUT CONTRAST TECHNIQUE: Multidetector CT imaging of the head and cervical spine was performed following the standard protocol without intravenous contrast. Multiplanar CT image reconstructions of the cervical spine were also generated. COMPARISON:  Head CT 12/16/2017. FINDINGS: CT HEAD FINDINGS Brain: Mild cerebral atrophy. Patchy and confluent areas of decreased attenuation are noted throughout the deep and periventricular white matter of the cerebral hemispheres bilaterally, compatible with chronic microvascular ischemic disease. Volume loss and focal areas of low attenuation within the right cerebellar hemisphere where there is also a dystrophic calcification, presumably areas of encephalomalacia/gliosis from prior surgery. No evidence of acute infarction, hemorrhage, hydrocephalus, extra-axial collection or mass lesion/mass effect. Vascular: No hyperdense vessel or unexpected calcification. Skull: Status post suboccipital craniectomy. Negative for fracture or focal lesion. Sinuses/Orbits: No acute finding. Other: None. CT CERVICAL SPINE FINDINGS Alignment: Normal. Skull base and vertebrae: No acute fracture. No primary bone lesion or focal pathologic process. Soft tissues and spinal canal: No  prevertebral fluid or swelling. No visible canal hematoma. Disc levels: Mild multilevel degenerative disc disease, most evident at C5-C6. Mild multilevel facet arthropathy. Upper chest: Negative. Other: None. IMPRESSION: 1. No evidence of significant acute traumatic injury to the skull, brain or cervical spine. 2. Mild cerebral atrophy with chronic microvascular ischemic changes in the cerebral white matter and postoperative changes in the right cerebellar hemisphere related to prior suboccipital craniectomy and evacuation of right cerebellar hemorrhage. 3. Mild multilevel degenerative disc disease and cervical spondylosis, as above. Electronically Signed   By: Vinnie Langton M.D.   On: 10/06/2021 09:31   CT Cervical Spine Wo Contrast  Result Date: 10/06/2021 CLINICAL DATA:  87 year old female with history of trauma from a fall with injury to the head. EXAM: CT HEAD WITHOUT CONTRAST CT CERVICAL SPINE WITHOUT CONTRAST TECHNIQUE: Multidetector CT imaging of the head and cervical spine was performed following the standard protocol without intravenous contrast. Multiplanar CT image reconstructions of the cervical spine were also generated. COMPARISON:  Head CT 12/16/2017. FINDINGS: CT HEAD FINDINGS Brain: Mild cerebral atrophy. Patchy and confluent areas of decreased attenuation are noted throughout the deep and  periventricular white matter of the cerebral hemispheres bilaterally, compatible with chronic microvascular ischemic disease. Volume loss and focal areas of low attenuation within the right cerebellar hemisphere where there is also a dystrophic calcification, presumably areas of encephalomalacia/gliosis from prior surgery. No evidence of acute infarction, hemorrhage, hydrocephalus, extra-axial collection or mass lesion/mass effect. Vascular: No hyperdense vessel or unexpected calcification. Skull: Status post suboccipital craniectomy. Negative for fracture or focal lesion. Sinuses/Orbits: No acute finding.  Other: None. CT CERVICAL SPINE FINDINGS Alignment: Normal. Skull base and vertebrae: No acute fracture. No primary bone lesion or focal pathologic process. Soft tissues and spinal canal: No prevertebral fluid or swelling. No visible canal hematoma. Disc levels: Mild multilevel degenerative disc disease, most evident at C5-C6. Mild multilevel facet arthropathy. Upper chest: Negative. Other: None. IMPRESSION: 1. No evidence of significant acute traumatic injury to the skull, brain or cervical spine. 2. Mild cerebral atrophy with chronic microvascular ischemic changes in the cerebral white matter and postoperative changes in the right cerebellar hemisphere related to prior suboccipital craniectomy and evacuation of right cerebellar hemorrhage. 3. Mild multilevel degenerative disc disease and cervical spondylosis, as above. Electronically Signed   By: Trudie Reed M.D.   On: 10/06/2021 09:31   DG Pelvis Portable  Result Date: 10/06/2021 CLINICAL DATA:  Postoperative hip EXAM: PORTABLE PELVIS 1-2 VIEWS COMPARISON:  X-ray right femur 10/06/2021 FINDINGS: Partially visualized total right hip arthroplasty. There is no evidence of pelvic fracture or diastasis. No hip fracture or dislocation of left. No pelvic bone lesions are seen. Subcutaneus soft tissue edema and emphysema. IMPRESSION: Partially visualized total right hip arthroplasty with overlying surgical changes of the soft tissues. Electronically Signed   By: Tish Frederickson M.D.   On: 10/06/2021 19:45   DG Hip Unilat W or Wo Pelvis 2-3 Views Right  Result Date: 10/06/2021 CLINICAL DATA:  Fall, pain EXAM: DG HIP (WITH OR WITHOUT PELVIS) 2-3V RIGHT; RIGHT FEMUR 2 VIEWS; RIGHT TIBIA AND FIBULA - 2 VIEW COMPARISON:  None. FINDINGS: Osteopenia. There is an impacted transcervical fracture of the right femoral neck. No fracture or dislocation of the bony pelvis or partially imaged left hip. No fracture or dislocation of the distal right femur. Vascular  calcinosis. No fracture or dislocation of the right tibia or fibula. IMPRESSION: 1. Impacted transcervical fracture of the right femoral neck. 2. No fracture or dislocation of the distal right femur. 3. No fracture or dislocation of the right tibia or fibula. 4. Osteopenia. Electronically Signed   By: Jearld Lesch M.D.   On: 10/06/2021 09:19   DG Femur Min 2 Views Right  Result Date: 10/06/2021 CLINICAL DATA:  Fall, pain EXAM: DG HIP (WITH OR WITHOUT PELVIS) 2-3V RIGHT; RIGHT FEMUR 2 VIEWS; RIGHT TIBIA AND FIBULA - 2 VIEW COMPARISON:  None. FINDINGS: Osteopenia. There is an impacted transcervical fracture of the right femoral neck. No fracture or dislocation of the bony pelvis or partially imaged left hip. No fracture or dislocation of the distal right femur. Vascular calcinosis. No fracture or dislocation of the right tibia or fibula. IMPRESSION: 1. Impacted transcervical fracture of the right femoral neck. 2. No fracture or dislocation of the distal right femur. 3. No fracture or dislocation of the right tibia or fibula. 4. Osteopenia. Electronically Signed   By: Jearld Lesch M.D.   On: 10/06/2021 09:19       Antibiotics: Anti-infectives (From admission, onward)    Start     Dose/Rate Route Frequency Ordered Stop   10/07/21 0030  ceFAZolin (  ANCEF) IVPB 2g/100 mL premix        2 g 200 mL/hr over 30 Minutes Intravenous Every 8 hours 10/06/21 2039 10/07/21 0934         DVT prophylaxis: Lovenox  Code Status: DNR  Family Communication: No family at bedside   Consultants: Orthopedics  Procedures: Hemiarthroplasty right hip    Objective    Physical Examination:   General-appears in no acute distress Heart-S1-S2, regular, no murmur auscultated Lungs-clear to auscultation bilaterally, no wheezing or crackles auscultated Abdomen-soft, nontender, no organomegaly Extremities-no edema in the lower extremities Neuro-alert, oriented x3, no focal deficit noted  Status is:  Inpatient  Dispo: The patient is from: Home              Anticipated d/c is to: Skilled nursing facility              Anticipated d/c date is: 10/10/2021              Patient currently not stable for discharge  Barrier to discharge-awaiting bed at skilled nursing facility  COVID-19 Labs  No results for input(s): DDIMER, FERRITIN, LDH, CRP in the last 72 hours.  Lab Results  Component Value Date   [place] NEGATIVE 10/06/2021            Recent Results (from the past 240 hour(s))  Resp Panel by RT-PCR (Flu A&B, Covid) Nasopharyngeal Swab     Status: None   Collection Time: 10/06/21  9:25 AM   Specimen: Nasopharyngeal Swab; Nasopharyngeal(NP) swabs in vial transport medium  Result Value Ref Range Status   SARS Coronavirus 2 by RT PCR NEGATIVE NEGATIVE Final    Comment: (NOTE) SARS-CoV-2 target nucleic acids are NOT DETECTED.  The SARS-CoV-2 RNA is generally detectable in upper respiratory specimens during the acute phase of infection. The lowest concentration of SARS-CoV-2 viral copies this assay can detect is 138 copies/mL. A negative result does not preclude SARS-Cov-2 infection and should not be used as the sole basis for treatment or other patient management decisions. A negative result may occur with  improper specimen collection/handling, submission of specimen other than nasopharyngeal swab, presence of viral mutation(s) within the areas targeted by this assay, and inadequate number of viral copies(<138 copies/mL). A negative result must be combined with clinical observations, patient history, and epidemiological information. The expected result is Negative.  Fact Sheet for Patients:  EntrepreneurPulse.com.au  Fact Sheet for Healthcare Providers:  IncredibleEmployment.be  This test is no t yet approved or cleared by the [place] FDA and  has been authorized for detection and/or diagnosis of SARS-CoV-2 by FDA under  an Emergency Use Authorization (EUA). This EUA will remain  in effect (meaning this test can be used) for the duration of the COVID-19 declaration under Section 564(b)(1) of the Act, 21 U.S.C.section 360bbb-3(b)(1), unless the authorization is terminated  or revoked sooner.       Influenza A by PCR NEGATIVE NEGATIVE Final   Influenza B by PCR NEGATIVE NEGATIVE Final    Comment: (NOTE) The Xpert Xpress SARS-CoV-2/FLU/RSV plus assay is intended as an aid in the diagnosis of influenza from Nasopharyngeal swab specimens and should not be used as a sole basis for treatment. Nasal washings and aspirates are unacceptable for Xpert Xpress SARS-CoV-2/FLU/RSV testing.  Fact Sheet for Patients: EntrepreneurPulse.com.au  Fact Sheet for Healthcare Providers: IncredibleEmployment.be  This test is not yet approved or cleared by the [place] FDA and has been authorized for detection and/or diagnosis of SARS-CoV-2 by FDA under  an Emergency Use Authorization (EUA). This EUA will remain in effect (meaning this test can be used) for the duration of the COVID-19 declaration under Section 564(b)(1) of the Act, 21 U.S.C. section 360bbb-3(b)(1), unless the authorization is terminated or revoked.  Performed at [facility], [place] [address], [place], [place] [address]     Oswald Hillock   [facility] If 7PM-7AM, please contact night-coverage at [email or web address], Office  [phone]   10/07/2021, 4:36 PM  LOS: 1 day

## 2021-10-07 NOTE — TOC CAGE-AID Note (Signed)
Transition of Care [facility]) - CAGE-AID Screening   Patient Details  Name: Tiffany Thornton MRN: [id number] Date of Birth: 20-Oct-1933  Transition of [facility]) CM/SW Contact:    Katha Hamming, RN Phone Number: [phone] 10/07/2021, 11:45 PM   Clinical Narrative:  Patient presents to hospital after a ground level fall resulting in R femoral neck fx. No history of alcohol or drug use, no need for resources at this time.  CAGE-AID Screening:    Have You Ever Felt You Ought to Cut Down on Your Drinking or Drug Use?: No Have People Annoyed You By Critizing Your Drinking Or Drug Use?: No Have You Felt Bad Or Guilty About Your Drinking Or Drug Use?: No Have You Ever Had a Drink or Used Drugs First Thing In The Morning to Steady Your Nerves or to Get Rid of a Hangover?: No CAGE-AID Score: 0  Substance Abuse Education Offered: No (no alcohol or drug use, no need for resources at this time)

## 2021-10-08 DIAGNOSIS — R251 Tremor, unspecified: Secondary | ICD-10-CM

## 2021-10-08 DIAGNOSIS — E871 Hypo-osmolality and hyponatremia: Secondary | ICD-10-CM | POA: Diagnosis not present

## 2021-10-08 DIAGNOSIS — S72001A Fracture of unspecified part of neck of right femur, initial encounter for closed fracture: Secondary | ICD-10-CM | POA: Diagnosis not present

## 2021-10-08 LAB — BASIC METABOLIC PANEL
Anion gap: 6 (ref 5–15)
BUN: 13 mg/dL (ref 8–23)
CO2: 28 mmol/L (ref 22–32)
Calcium: 8.3 mg/dL — ABNORMAL LOW (ref 8.9–10.3)
Chloride: 99 mmol/L (ref 98–111)
Creatinine, Ser: 0.97 mg/dL (ref 0.44–1.00)
GFR, Estimated: 57 mL/min — ABNORMAL LOW (ref >60)
Glucose, Bld: 126 mg/dL — ABNORMAL HIGH (ref 70–99)
Potassium: 4.4 mmol/L (ref 3.5–5.1)
Sodium: 133 mmol/L — ABNORMAL LOW (ref 135–145)

## 2021-10-08 MED ORDER — PROPRANOLOL HCL ER 80 MG PO CP24
80.0000 mg | ORAL_CAPSULE | Freq: Every day | ORAL | Status: DC
  Administered 2021-10-08 – 2021-10-11 (×4): 80 mg via ORAL
  Filled 2021-10-08 (×4): qty 1

## 2021-10-08 MED ORDER — LEVOTHYROXINE SODIUM 75 MCG PO TABS
75.0000 ug | ORAL_TABLET | Freq: Every day | ORAL | Status: DC
  Administered 2021-10-09 – 2021-10-12 (×4): 75 ug via ORAL
  Filled 2021-10-08 (×4): qty 1

## 2021-10-08 NOTE — Progress Notes (Signed)
     Subjective:  Patient reports pain as well controlled. Worked well with PT today mobilizing 20 feet. Denies distal n/t. Eager to go to rehab. No new issues.  Objective:   VITALS:   Vitals:   10/07/21 2046 10/08/21 0407 10/08/21 1230 10/08/21 1257  BP: (!) 152/61 (!) 127/57 (!) 162/62 (!) 155/66  Pulse: 90 70  100  Resp: 18 18  15   Temp: 98.1 F (36.7 C) 98.3 F (36.8 C)  98.2 F (36.8 C)  TempSrc: Oral Oral  Oral  SpO2: 93% 94%  100%  Weight:      Height:        Sensation intact distally Intact pulses distally Dorsiflexion/Plantar flexion intact Incision: dressing C/D/I Compartment soft   Lab Results  Component Value Date   WBC 14.1 (H) 10/07/2021   HGB 10.2 (L) 10/07/2021   HCT 30.2 (L) 10/07/2021   MCV 90.7 10/07/2021   PLT 278 10/07/2021   BMET    Component Value Date/Time   NA 133 (L) 10/08/2021 0220   K 4.4 10/08/2021 0220   CL 99 10/08/2021 0220   CO2 28 10/08/2021 0220   GLUCOSE 126 (H) 10/08/2021 0220   BUN 13 10/08/2021 0220   CREATININE 0.97 10/08/2021 0220   CALCIUM 8.3 (L) 10/08/2021 0220   GFRNONAA 57 (L) 10/08/2021 0220     Assessment/Plan: 2 Days Post-Op   Active Problems:   Closed right hip fracture (HCC)   Post op recs: WB: WBAT with posterior hip precautions x6 weeks Abx: ancef x23 hours post op Imaging: PACU xrays Dressing: Aquacel dressing to be kept intact until follow-up DVT prophylaxis: lovenox starting POD1 x4 weeks Follow up: 2 weeks after surgery for a wound check with Dr. 13/09/2021 at [facility].  Address: [address], [place], [place]  Office Phone: [phone]    Geo Slone A Madox Corkins 10/08/2021, 1:23 PM   13/09/2021, MD Cell [phone]  Contact information:   [phone] 7am-5pm epic message Dr. [email or web address], or call office for patient follow up: [phone] After hours and holidays please check [email or web address] for group call information for [facility]

## 2021-10-08 NOTE — Progress Notes (Signed)
Physical Therapy Treatment Patient Details Name: Tiffany Thornton MRN: [id number] DOB: Mar 03, 1933 Today's Date: 10/08/2021   History of Present Illness 87 yo female presents [facility] on 11/9 from [facility] after fall while walking. Pt sustained R displaced femoral neck fracture, s/p R hemiarthroplasty posterolateral approach. PMH includes HLD, hypothyroidism, CVA with chronic R deficits, craniotomy for evacuation of SDH 2019, most recent fall 2.5 years ago.    PT Comments    Pt supine in bed on arrival this session.  Pt continues to benefit from skilled rehab in a post acute setting as she lacks support at home.  She is showing improvement and remains motivated.  Will continue to follow during hospitalization.     Recommendations for follow up therapy are one component of a multi-disciplinary discharge planning process, led by the attending physician.  Recommendations may be updated based on patient status, additional functional criteria and insurance authorization.  Follow Up Recommendations  Skilled nursing-short term rehab (<3 hours/day)     Assistance Recommended at Discharge Frequent or constant Supervision/Assistance  Equipment Recommendations  None recommended by PT    Recommendations for Other Services       Precautions / Restrictions Precautions Precautions: Fall;Posterior Hip Precaution Booklet Issued: No Precaution Comments: reviewed no hip flexion >90 degrees, no hip IR past midline, no hip adduction past midline. Ordered abduction pillow, stacked pillows being used betwen pt legs in the meantime. Restrictions Weight Bearing Restrictions: Yes RLE Weight Bearing: Weight bearing as tolerated     Mobility  Bed Mobility Overal bed mobility: Needs Assistance Bed Mobility: Supine to Sit     Supine to sit: Mod assist     General bed mobility comments: Pt supine in bed on arrival this session.  Performed with assistance to move LEs to edge of bed.  Pt  required assistance to move trunk into sitting.    Transfers Overall transfer level: Needs assistance Equipment used: Rolling walker (2 wheels) Transfers: Sit to/from Stand Sit to Stand: Mod assist;From elevated surface           General transfer comment: Cues for hand and foot placement to rise into sitting from elevated surface.    Ambulation/Gait Ambulation/Gait assistance: Min assist;+2 safety/equipment Gait Distance (Feet): 20 Feet Assistive device: Rolling walker (2 wheels) Gait Pattern/deviations: Step-to pattern;Antalgic;Decreased weight shift to left       General Gait Details: Cues for sequencing and safety with RW.  Pt slow and guarded and required close chair follow for safety.   Stairs             Wheelchair Mobility    Modified Rankin (Stroke Patients Only)       Balance Overall balance assessment: Needs assistance;History of Falls Sitting-balance support: No upper extremity supported;Feet supported Sitting balance-Leahy Scale: Fair Sitting balance - Comments: able to sit EOB without support     Standing balance-Leahy Scale: Poor                              Cognition Arousal/Alertness: Awake/alert Behavior During Therapy: WFL for tasks assessed/performed Overall Cognitive Status: Within Functional Limits for tasks assessed                                          Exercises      General Comments  Pertinent Vitals/Pain Pain Assessment: 0-10 Pain Score: 5  Pain Location: R hip, bilat LEs Pain Descriptors / Indicators: Sore;Discomfort;Grimacing;Guarding Pain Intervention(s): Monitored during session;Repositioned    Home Living                          Prior Function            PT Goals (current goals can now be found in the care plan section) Acute Rehab PT Goals Patient Stated Goal: back to walking Potential to Achieve Goals: Good Progress towards PT goals: Progressing toward  goals    Frequency    Min 3X/week      PT Plan Current plan remains appropriate    Co-evaluation              AM-PAC PT "6 Clicks" Mobility   Outcome Measure  Help needed turning from your back to your side while in a flat bed without using bedrails?: A Lot Help needed moving from lying on your back to sitting on the side of a flat bed without using bedrails?: A Lot Help needed moving to and from a bed to a chair (including a wheelchair)?: Total Help needed standing up from a chair using your arms (e.g., wheelchair or bedside chair)?: Total Help needed to walk in hospital room?: Total Help needed climbing 3-5 steps with a railing? : Total 6 Click Score: 8    End of Session Equipment Utilized During Treatment: Gait belt Activity Tolerance: Patient limited by pain;Patient limited by fatigue Patient left: with call bell/phone within reach;in chair;with chair alarm set Nurse Communication: Mobility status PT Visit Diagnosis: Other abnormalities of gait and mobility (R26.89);Muscle weakness (generalized) (M62.81);Pain Pain - Right/Left: Right Pain - part of body: Hip     Time: 4496-7591 PT Time Calculation (min) (ACUTE ONLY): 27 min  Charges:  $Gait Training: 8-22 mins $Therapeutic Activity: 8-22 mins                     Bonney Leitz , PTA Acute Rehabilitation Services Pager [phone] Office [phone]    Dequann Vandervelden Artis Delay 10/08/2021, 12:19 PM

## 2021-10-08 NOTE — TOC Initial Note (Addendum)
Transition of Care [facility]) - Initial/Assessment Note    Patient Details  Name: Tiffany Thornton MRN: [id number] Date of Birth: Jan 11, 1933  Transition of Care [facility]) CM/SW Contact:    Coralee Pesa, [place] Phone Number: 10/08/2021, 11:23 AM  Clinical Narrative:                 CSW spoke with son who is agreeable to DC plan and would like to be informed of any bed offers that come available.  CSW spoke with pt again and let her know that [facility] is unable to offer a bed at this time due to insurance issues. Pt is upset, but states she will have to do what she has to do. She has a preference for a facility that is closest to the hospital, is interested in [place]. She has had 2 vaccines and 1 booster. She requested CSW call her son ron, left a Advertising account executive. CSW will complete workup and faxout. TOC will continue to follow.  CSW met with pt at bedside to discuss SNF recommendation. Pt states she has been to [facility] before and would only consider going back there. CSW advised that this was not the recommendation at this time and pt asked if she could be assessed for it.  CSW spoke with Aimee with PT who stated she would have someone take a look, but was unsure if pt would qualify. Pt noted her family would be in later, CSW will attempt to speak with them and pt to discuss next steps.        Patient Goals and CMS Choice        Expected Discharge Plan and Services                                                Prior Living Arrangements/Services                       Activities of Daily Living Home Assistive Devices/Equipment: Eyeglasses, Gilford Rile (specify type), Shower chair with back (reading glasses) ADL Screening (condition at time of admission) Patient's cognitive ability adequate to safely complete daily activities?: Yes Is the patient deaf or have difficulty hearing?: No Does the patient have difficulty seeing, even when wearing glasses/contacts?: No Does the patient have  difficulty concentrating, remembering, or making decisions?: No Patient able to express need for assistance with ADLs?: Yes Does the patient have difficulty dressing or bathing?: No Independently performs ADLs?: No Communication: Independent Dressing (OT): Needs assistance Is this a change from baseline?: Change from baseline, expected to last >3 days Grooming: Independent Feeding: Independent Bathing: Needs assistance Is this a change from baseline?: Change from baseline, expected to last >3 days Toileting: Needs assistance Is this a change from baseline?: Change from baseline, expected to last >3days In/Out Bed: Needs assistance Is this a change from baseline?: Change from baseline, expected to last >3 days Walks in Home: Needs assistance Is this a change from baseline?: Change from baseline, expected to last >3 days Does the patient have difficulty walking or climbing stairs?: Yes Weakness of Legs: Right Weakness of Arms/Hands: Right (right hand weak since brain bleed per pt)  Permission Sought/Granted                  Emotional Assessment  Admission diagnosis:  Hyponatremia [E87.1] Closed right hip fracture ([place]) [S72.001A] Fall, initial encounter [W19.XXXA] Closed displaced fracture of right femoral neck ([place]) [S72.001A] Patient Active Problem List   Diagnosis Date Noted   Closed right hip fracture ([place]) 10/06/2021   Cerebellar hemorrhage, nontraumatic ([place]) 05/07/2018   Ataxia, post-stroke    Essential hypertension    Prediabetes    Hypokalemia    Dizziness and giddiness    Cognitive deficit, post-stroke    Hypertensive emergency    Hyperlipidemia    Cerebellar edema (HCC)    Leukocytosis    S/P craniotomy 04/30/2018   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

## 2021-10-08 NOTE — Anesthesia Postprocedure Evaluation (Signed)
Anesthesia Post Note  Patient: Tiffany Thornton  Procedure(s) Performed: ARTHROPLASTY BIPOLAR HIP (HEMIARTHROPLASTY) (Right: Hip)     Patient location during evaluation: PACU Anesthesia Type: General Level of consciousness: awake and alert Pain management: pain level controlled Vital Signs Assessment: post-procedure vital signs reviewed and stable Respiratory status: spontaneous breathing, nonlabored ventilation, respiratory function stable and patient connected to nasal cannula oxygen Cardiovascular status: blood pressure returned to baseline and stable Postop Assessment: no apparent nausea or vomiting Anesthetic complications: no   No notable events documented.  Last Vitals:  Vitals:   10/07/21 2046 10/08/21 0407  BP: (!) 152/61 (!) 127/57  Pulse: 90 70  Resp: 18 18  Temp: 36.7 C 36.8 C  SpO2: 93% 94%    Last Pain:  Vitals:   10/08/21 0407  TempSrc: Oral  PainSc:                  [name]

## 2021-10-08 NOTE — Progress Notes (Signed)
Occupational Therapy Treatment Patient Details Name: MAKYNA NIEHOFF MRN: [id number] DOB: 06-Oct-1933 Today's Date: 10/08/2021   History of present illness 87 yo female presents to [facility] on 11/9 from [facility] after fall while walking. Pt sustained R displaced femoral neck fracture, s/p R hemiarthroplasty posterolateral approach. PMH includes HLD, hypothyroidism, CVA with chronic R deficits, craniotomy for evacuation of SDH 2019, most recent fall 2.5 years ago.   OT comments  Pt in chair upon arrival, wanting to return to bed. Pt completed seated ADLs with set up A and mod A for bed mobility. Pt unable to successfully complete sit to stand transfer with RW. Able to stand with 2 person hand held assist and R knee block. Pt states she feels weaker than she did this morning during PT session, reports mild pain more in calves/feet than R hip. Pt limited by decreased balance, activity tolerance, ROM, strength, and pain at this time, will continue to follow acutely to address impairments listed. Pt to benefit from SNF at d/c.   Recommendations for follow up therapy are one component of a multi-disciplinary discharge planning process, led by the attending physician.  Recommendations may be updated based on patient status, additional functional criteria and insurance authorization.    Follow Up Recommendations  Skilled nursing-short term rehab (<3 hours/day)    Assistance Recommended at Discharge Frequent or constant Supervision/Assistance  Equipment Recommendations  None recommended by OT    Recommendations for Other Services PT consult    Precautions / Restrictions Precautions Precautions: Fall;Posterior Hip Precaution Booklet Issued: No Precaution Comments: pt in bed with abduction pillow in place Restrictions Weight Bearing Restrictions: Yes RLE Weight Bearing: Weight bearing as tolerated       Mobility Bed Mobility Overal bed mobility: Needs Assistance Bed Mobility: Sit  to Supine     Supine to sit: Mod assist     General bed mobility comments: Mod A to bring LE's onto bed from sitting    Transfers Overall transfer level: Needs assistance Equipment used: Rolling walker (2 wheels) Transfers: Sit to/from Stand;Bed to chair/wheelchair/BSC Sit to Stand: Max assist;+2 physical assistance Stand pivot transfers: Max assist;+2 physical assistance         General transfer comment: Cues for hand and foot placement to rise into sitting from elevated surface.     Balance Overall balance assessment: Needs assistance;History of Falls Sitting-balance support: No upper extremity supported;Feet supported Sitting balance-Leahy Scale: Fair Sitting balance - Comments: able to sit EOB without support     Standing balance-Leahy Scale: Poor                             ADL either performed or assessed with clinical judgement   ADL Overall ADL's : Needs assistance/impaired     Grooming: Oral care;Set up;Sitting Grooming Details (indicate cue type and reason): completed sitting in recliner                 Toilet Transfer: Maximal assistance;+2 for physical assistance;Stand-pivot Toilet Transfer Details (indicate cue type and reason): simulated toilet transfer from chair > bed         Functional mobility during ADLs: Maximal assistance;+2 for physical assistance      Extremity/Trunk Assessment Upper Extremity Assessment Upper Extremity Assessment: Overall WFL for tasks assessed RUE Deficits / Details: 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   Lower Extremity Assessment Lower Extremity Assessment: Defer to PT evaluation  Vision   Vision Assessment?: No apparent visual deficits   Perception     Praxis      Cognition Arousal/Alertness: Awake/alert Behavior During Therapy: WFL for tasks assessed/performed Overall Cognitive Status: Within Functional Limits for tasks assessed                                             Exercises     Shoulder Instructions       General Comments      Pertinent Vitals/ Pain       Pain Assessment: Faces Pain Score: 3  Faces Pain Scale: Hurts a little bit Pain Location: states pain more in bilateral LE's than hip Pain Descriptors / Indicators: Sore;Discomfort;Grimacing;Guarding Pain Intervention(s): Limited activity within patient's tolerance;Monitored during session;Repositioned;Relaxation  Home Living                                          Prior Functioning/Environment              Frequency  Min 2X/week        Progress Toward Goals  OT Goals(current goals can now be found in the care plan section)  Progress towards OT goals: Progressing toward goals  Acute Rehab OT Goals Patient Stated Goal: return home OT Goal Formulation: With patient Time For Goal Achievement: 10/21/21 Potential to Achieve Goals: Good  Plan Discharge plan remains appropriate;Frequency remains appropriate    Co-evaluation          OT goals addressed during session: ADL's and self-care      AM-PAC OT "6 Clicks" Daily Activity     Outcome Measure   Help from another person eating meals?: None Help from another person taking care of personal grooming?: A Little Help from another person toileting, which includes using toliet, bedpan, or urinal?: A Lot Help from another person bathing (including washing, rinsing, drying)?: A Lot Help from another person to put on and taking off regular upper body clothing?: A Lot Help from another person to put on and taking off regular lower body clothing?: Total 6 Click Score: 14    End of Session Equipment Utilized During Treatment: Gait belt;Rolling walker (2 wheels)  OT Visit Diagnosis: Unsteadiness on feet (R26.81);Repeated falls (R29.6);Other abnormalities of gait and mobility (R26.89);Muscle weakness (generalized) (M62.81);History of falling (Z91.81);Pain;Ataxia, unspecified (R27.0)    Activity Tolerance Patient tolerated treatment well;Patient limited by pain   Patient Left in bed;with call bell/phone within reach;with bed alarm set   Nurse Communication Mobility status        Time: 5176-1607 OT Time Calculation (min): 28 min  Charges: OT General Charges $OT Visit: 1 Visit OT Treatments $Self Care/Home Management : 23-37 mins  Alfonzo Beers, OTD, OTR/L Acute Rehab [phone]) [phone]   Mayer Masker 10/08/2021, 2:58 PM

## 2021-10-08 NOTE — NC FL2 (Signed)
[place] MEDICAID FL2 LEVEL OF CARE SCREENING TOOL     IDENTIFICATION  Patient Name: Tiffany Thornton Birthdate: May 26, 1933 Sex: female Admission Date (Current Location): 10/06/2021  [facility] and [place] Number:  Producer, television/film/video and Address:  [facility], [address] [place], [place] [address]      Provider Number: [id number]  Attending Physician Name and Address:  Meredeth Ide, MD  Relative Name and Phone Number:  Shazia Mitchener [phone]    Current Level of Care: Hospital Recommended Level of Care: Skilled Nursing Facility Prior Approval Number:    Date Approved/Denied:   PASRR Number: [id number]  Discharge Plan: SNF    Current Diagnoses: Patient Active Problem List   Diagnosis Date Noted   Closed right hip fracture (HCC) 10/06/2021   Cerebellar hemorrhage, nontraumatic (HCC) 05/07/2018   Ataxia, post-stroke    Essential hypertension    Prediabetes    Hypokalemia    Dizziness and giddiness    Cognitive deficit, post-stroke    Hypertensive emergency    Hyperlipidemia    Cerebellar edema (HCC)    Leukocytosis    S/P craniotomy 04/30/2018   Left-sided nontraumatic intracerebral hemorrhage of cerebellum (HCC) 04/30/2018    Orientation RESPIRATION BLADDER Height & Weight     Self, Time, Situation, Place  Normal Continent, Indwelling catheter Weight: 112 lb (50.8 kg) Height:  5\' 6"  (167.6 cm)  BEHAVIORAL SYMPTOMS/MOOD NEUROLOGICAL BOWEL NUTRITION STATUS      Continent Diet (See DC summary)  AMBULATORY STATUS COMMUNICATION OF NEEDS Skin   Extensive Assist Verbally Surgical wounds (R hip SI)                       Personal Care Assistance Level of Assistance  Bathing, Feeding, Dressing Bathing Assistance: Maximum assistance Feeding assistance: Independent Dressing Assistance: Maximum assistance     Functional Limitations Info  Sight, Hearing, Speech Sight Info: Adequate Hearing Info: Adequate Speech Info: Adequate     SPECIAL CARE FACTORS FREQUENCY  PT (By licensed PT), OT (By licensed OT)     PT Frequency: 5x week OT Frequency: 5x week            Contractures Contractures Info: Not present    Additional Factors Info  Code Status, Allergies Code Status Info: DNR Allergies Info: NKA           Current Medications (10/08/2021):  This is the current hospital active medication list Current Facility-Administered Medications  Medication Dose Route Frequency Provider Last Rate Last Admin   0.9 %  sodium chloride infusion   Intravenous PRN 13/09/2021 A, DO 75 mL/hr at 10/06/21 2032 Restarted at 10/06/21 2032   aspirin EC tablet 81 mg  81 mg Oral Daily 2033, MD   81 mg at 10/08/21 1006   enoxaparin (LOVENOX) injection 40 mg  40 mg Subcutaneous Q24H 13/11/22, MD   40 mg at 10/08/21 1006   feeding supplement (ENSURE ENLIVE / ENSURE PLUS) liquid 237 mL  237 mL Oral BID BM 13/11/22, MD   237 mL at 10/07/21 1302   HYDROcodone-acetaminophen (NORCO/VICODIN) 5-325 MG per tablet 1-2 tablet  1-2 tablet Oral Q6H PRN 13/10/22 A, DO   1 tablet at 10/08/21 0158   lactated ringers infusion   Intravenous Continuous 13/11/22, MD 10 mL/hr at 10/07/21 1145 New Bag at 10/07/21 1145   [START ON 10/09/2021] levothyroxine (SYNTHROID) tablet 75 mcg  75  mcg Oral QAC breakfast Meredeth Ide, MD       morphine 2 MG/ML injection 0.5 mg  0.5 mg Intravenous Q2H PRN Ronaldo Miyamoto, Tyrone A, DO   0.5 mg at 10/07/21 0147   multivitamin with minerals tablet 1 tablet  1 tablet Oral Daily Meredeth Ide, MD   1 tablet at 10/08/21 1006   ondansetron (ZOFRAN) injection 4 mg  4 mg Intravenous Q6H PRN Carollee Herter, DO   4 mg at 10/07/21 0146   propranolol ER (INDERAL LA) 24 hr capsule 80 mg  80 mg Oral Daily Meredeth Ide, MD   80 mg at 10/08/21 1230   rosuvastatin (CRESTOR) tablet 10 mg  10 mg Oral q1800 Meredeth Ide, MD   10 mg at 10/07/21 1744     Discharge Medications: Please see discharge  summary for a list of discharge medications.  Relevant Imaging Results:  Relevant Lab Results:   Additional Information SS# [id number]  Carley Hammed, LCSWA

## 2021-10-08 NOTE — Progress Notes (Addendum)
[facility]  PROGRESS NOTE  Tiffany Thornton [id number] DOB: 16-Dec-1932 DOA: 10/06/2021 PCP: Gaynelle Arabian, MD   Brief HPI:   87 year old female with medical history of hypothyroidism, hypertension, hyperlipidemia, anxiety, ICH presented with right hip pain after a fall.  She was in normal state of health that morning.  She got up from the bed was trying to move to the living room.  As she was walking, lost her balance and fell on her right side. In the ED she was found to have femoral neck fracture.  Orthopedics was consulted.    Subjective   Patient seen and examined, underwent right hemiarthroplasty yesterday.  Awaiting transfer to skilled nursing facility for rehab.  She complains of right upper extremity tremors since she had her brain surgery.   Assessment/Plan:    Right femoral neck fracture -Patient underwent hemiarthroplasty yesterday -Orthopedics has signed off -Lovenox 40 mg subcu daily for 4 weeks for DVT prophylaxis -Patient to go to skilled nursing facility for rehab  Hypertension -Blood pressure is stable -Metoprolol changed to propanolol LA 80 mg daily  Hyperlipidemia -Continue statin  Hypothyroidism -Continue Synthroid  Hyponatremia -Mild, sodium improved to 133 today -Check TSH-0.489, serum osmolality-281 -Patient is on HCTZ at home; HCTZ currently on hold  Right upper extremity action tremor -Patient says that she has right upper extremity tremor especially when she starts to use her hand -This started after patient had craniotomy for intracranial hemorrhage in 2019 -She can follow-up with neurosurgery as outpatient -In the meantime I will change her metoprolol to propranolol LA 80 mg daily to see if that improves her tremor -Also patient's TSH is on the lower side 0.489, we will cut down the dose of Synthroid to 75 mcg to see if this helps her tremor  DVT prophylaxis -Patient is on Lovenox 40 mg subcu daily for DVT prophylaxis for 4  weeks     Scheduled medications:    aspirin EC  81 mg Oral Daily   enoxaparin  40 mg Subcutaneous Q24H   feeding supplement  237 mL Oral BID BM   [START ON 10/09/2021] levothyroxine  75 mcg Oral QAC breakfast   multivitamin with minerals  1 tablet Oral Daily   propranolol ER  80 mg Oral Daily   rosuvastatin  10 mg Oral q1800     Data Reviewed:   CBG:  No results for input(s): GLUCAP in the last 168 hours.  SpO2: 94 %    Vitals:   10/07/21 1438 10/07/21 2037 10/07/21 2046 10/08/21 0407  BP: (!) 128/51  (!) 152/61 (!) 127/57  Pulse: 82 84 90 70  Resp: 16  18 18   Temp: 99 F (37.2 C)  98.1 F (36.7 C) 98.3 F (36.8 C)  TempSrc: Oral  Oral Oral  SpO2: 98%  93% 94%  Weight:      Height:         Intake/Output Summary (Last 24 hours) at 10/08/2021 0954 Last data filed at 10/08/2021 0500 Gross per 24 hour  Intake 458.84 ml  Output 1950 ml  Net -1491.16 ml    11/09 1901 - 11/11 0700 In: 2135 [P.O.:480; I.V.:1655] Out: 1950 [Urine:1950]  Filed Weights   10/06/21 0812 10/06/21 1542  Weight: 50.3 kg 50.8 kg    Data Reviewed: Basic Metabolic Panel: Recent Labs  Lab 10/06/21 0925 10/07/21 0334 10/08/21 0220  NA 129* 130* 133*  K 4.9 4.5 4.4  CL 97* 98 99  CO2 26 25 28   GLUCOSE 139* 172*  126*  BUN 17 10 13   CREATININE 0.84 0.80 0.97  CALCIUM 9.4 8.4* 8.3*   Liver Function Tests: No results for input(s): AST, ALT, ALKPHOS, BILITOT, PROT, ALBUMIN in the last 168 hours. No results for input(s): LIPASE, AMYLASE in the last 168 hours. No results for input(s): AMMONIA in the last 168 hours. CBC: Recent Labs  Lab 10/06/21 0925 10/07/21 0334  WBC 11.5* 14.1*  NEUTROABS 8.5*  --   HGB 12.5 10.2*  HCT 37.2 30.2*  MCV 91.6 90.7  PLT 324 278   Cardiac Enzymes: No results for input(s): CKTOTAL, CKMB, CKMBINDEX, TROPONINI in the last 168 hours. BNP (last 3 results) No results for input(s): BNP in the last 8760 hours.  ProBNP (last 3 results) No  results for input(s): PROBNP in the last 8760 hours.  CBG: No results for input(s): GLUCAP in the last 168 hours.     Radiology Reports  DG Pelvis Portable  Result Date: 10/06/2021 CLINICAL DATA:  Postoperative hip EXAM: PORTABLE PELVIS 1-2 VIEWS COMPARISON:  X-ray right femur 10/06/2021 FINDINGS: Partially visualized total right hip arthroplasty. There is no evidence of pelvic fracture or diastasis. No hip fracture or dislocation of left. No pelvic bone lesions are seen. Subcutaneus soft tissue edema and emphysema. IMPRESSION: Partially visualized total right hip arthroplasty with overlying surgical changes of the soft tissues. Electronically Signed   By: 13/07/2021 M.D.   On: 10/06/2021 19:45   DG HIP PORT UNILAT WITH PELVIS 1V RIGHT  Result Date: 10/07/2021 CLINICAL DATA:  Right hip replacement EXAM: DG HIP (WITH OR WITHOUT PELVIS) 1V PORT RIGHT COMPARISON:  10/06/2020 FINDINGS: Right hip hemiarthroplasty. Prosthesis in satisfactory position. No complication or acute fracture. IMPRESSION: Satisfactory right hip hemiarthroplasty Electronically Signed   By: 13/07/2020 M.D.   On: 10/07/2021 16:36       Antibiotics: Anti-infectives (From admission, onward)    Start     Dose/Rate Route Frequency Ordered Stop   10/07/21 0030  ceFAZolin (ANCEF) IVPB 2g/100 mL premix        2 g 200 mL/hr over 30 Minutes Intravenous Every 8 hours 10/06/21 2039 10/07/21 0934         DVT prophylaxis: Lovenox  Code Status: DNR  Family Communication: No family at bedside   Consultants: Orthopedics  Procedures: Hemiarthroplasty right hip    Objective    Physical Examination:   General-appears in no acute distress Heart-S1-S2, regular, no murmur auscultated Lungs-clear to auscultation bilaterally, no wheezing or crackles auscultated Abdomen-soft, nontender, no organomegaly Extremities-no edema in the lower extremities, action tremor noted in right upper extremity Neuro-alert,  oriented x3, no focal deficit noted  Status is: Inpatient  Dispo: The patient is from: Home              Anticipated d/c is to: Skilled nursing facility              Anticipated d/c date is: 10/10/2021              Patient currently not stable for discharge  Barrier to discharge-awaiting bed at skilled nursing facility  COVID-19 Labs  No results for input(s): DDIMER, FERRITIN, LDH, CRP in the last 72 hours.  Lab Results  Component Value Date   SARSCOV2NAA NEGATIVE 10/06/2021            Recent Results (from the past 240 hour(s))  Resp Panel by RT-PCR (Flu A&B, Covid) Nasopharyngeal Swab     Status: None   Collection Time: 10/06/21  9:25  AM   Specimen: Nasopharyngeal Swab; Nasopharyngeal(NP) swabs in vial transport medium  Result Value Ref Range Status   SARS Coronavirus 2 by RT PCR NEGATIVE NEGATIVE Final    Comment: (NOTE) SARS-CoV-2 target nucleic acids are NOT DETECTED.  The SARS-CoV-2 RNA is generally detectable in upper respiratory specimens during the acute phase of infection. The lowest concentration of SARS-CoV-2 viral copies this assay can detect is 138 copies/mL. A negative result does not preclude SARS-Cov-2 infection and should not be used as the sole basis for treatment or other patient management decisions. A negative result may occur with  improper specimen collection/handling, submission of specimen other than nasopharyngeal swab, presence of viral mutation(s) within the areas targeted by this assay, and inadequate number of viral copies(<138 copies/mL). A negative result must be combined with clinical observations, patient history, and epidemiological information. The expected result is Negative.  Fact Sheet for Patients:  EntrepreneurPulse.com.au  Fact Sheet for Healthcare Providers:  IncredibleEmployment.be  This test is no t yet approved or cleared by the [place] FDA and  has been authorized for  detection and/or diagnosis of SARS-CoV-2 by FDA under an Emergency Use Authorization (EUA). This EUA will remain  in effect (meaning this test can be used) for the duration of the COVID-19 declaration under Section 564(b)(1) of the Act, 21 U.S.C.section 360bbb-3(b)(1), unless the authorization is terminated  or revoked sooner.       Influenza A by PCR NEGATIVE NEGATIVE Final   Influenza B by PCR NEGATIVE NEGATIVE Final    Comment: (NOTE) The Xpert Xpress SARS-CoV-2/FLU/RSV plus assay is intended as an aid in the diagnosis of influenza from Nasopharyngeal swab specimens and should not be used as a sole basis for treatment. Nasal washings and aspirates are unacceptable for Xpert Xpress SARS-CoV-2/FLU/RSV testing.  Fact Sheet for Patients: EntrepreneurPulse.com.au  Fact Sheet for Healthcare Providers: IncredibleEmployment.be  This test is not yet approved or cleared by the [place] FDA and has been authorized for detection and/or diagnosis of SARS-CoV-2 by FDA under an Emergency Use Authorization (EUA). This EUA will remain in effect (meaning this test can be used) for the duration of the COVID-19 declaration under Section 564(b)(1) of the Act, 21 U.S.C. section 360bbb-3(b)(1), unless the authorization is terminated or revoked.  Performed at [facility], [place] [address] [place], [place] [address]     Oswald Hillock   [facility] If 7PM-7AM, please contact night-coverage at [email or web address], Office  [phone]   10/08/2021, 9:54 AM  LOS: 2 days

## 2021-10-09 MED ORDER — SENNOSIDES-DOCUSATE SODIUM 8.6-50 MG PO TABS
1.0000 | ORAL_TABLET | Freq: Two times a day (BID) | ORAL | Status: DC | PRN
  Administered 2021-10-09 – 2021-10-11 (×3): 1 via ORAL
  Filled 2021-10-09 (×3): qty 1

## 2021-10-09 NOTE — Plan of Care (Signed)
  Problem: Activity: Goal: Ability to ambulate and perform ADLs will improve Outcome: Progressing   Problem: Clinical Measurements: Goal: Postoperative complications will be avoided or minimized Outcome: Progressing   Problem: Pain Management: Goal: Pain level will decrease Outcome: Progressing   

## 2021-10-09 NOTE — Progress Notes (Signed)
[facility]  PROGRESS NOTE  Tiffany Thornton [id number] DOB: [birth year] DOA: 10/06/2021 PCP: Gaynelle Arabian, MD   Brief HPI:   87 year old female with medical history of hypothyroidism, hypertension, hyperlipidemia, anxiety, ICH presented with right hip pain after a fall.  She was in normal state of health that morning.  She got up from the bed was trying to move to the living room.  As she was walking, lost her balance and fell on her right side. In the ED she was found to have femoral neck fracture.  Orthopedics was consulted.    Subjective   Patient seen and examined, denies any complaints.  Awaiting to go to skilled nursing facility for rehab.   Assessment/Plan:    Right femoral neck fracture -Patient underwent hemiarthroplasty yesterday -Orthopedics has signed off -Lovenox 40 mg subcu daily for 4 weeks for DVT prophylaxis -Patient to go to skilled nursing facility for rehab  Hypertension -Blood pressure is stable -Metoprolol changed to propanolol LA 80 mg daily  Hyperlipidemia -Continue statin  Hypothyroidism -Continue Synthroid  Hyponatremia -Mild, sodium improved to 133  -Check TSH-0.489, serum osmolality-281 -Patient is on HCTZ at home; HCTZ currently on hold  Right upper extremity action tremor -Patient says that she has right upper extremity tremor especially when she starts to use her hand -This started after patient had craniotomy for intracranial hemorrhage in 2019 -She can follow-up with neurosurgery as outpatient -In the meantime I will change her metoprolol to propranolol LA 80 mg daily to see if that improves her tremor -Also patient's TSH is on the lower side 0.489, we will cut down the dose of Synthroid to 75 mcg to see if this helps her tremor  DVT prophylaxis -Patient is on Lovenox 40 mg subcu daily for DVT prophylaxis for 4 weeks     Scheduled medications:    aspirin EC  81 mg Oral Daily   enoxaparin  40 mg Subcutaneous Q24H    feeding supplement  237 mL Oral BID BM   levothyroxine  75 mcg Oral QAC breakfast   multivitamin with minerals  1 tablet Oral Daily   propranolol ER  80 mg Oral Daily   rosuvastatin  10 mg Oral q1800     Data Reviewed:   CBG:  No results for input(s): GLUCAP in the last 168 hours.  SpO2: 99 %    Vitals:   10/08/21 1257 10/08/21 2051 10/09/21 0529 10/09/21 0754  BP: (!) 155/66 (!) 114/42 (!) 165/70 140/60  Pulse: 100 61 74 67  Resp: 15   18  Temp: 98.2 F (36.8 C) 97.8 F (36.6 C) 97.8 F (36.6 C) 98.8 F (37.1 C)  TempSrc: Oral   Oral  SpO2: 100% 93% 97% 99%  Weight:      Height:         Intake/Output Summary (Last 24 hours) at 10/09/2021 1603 Last data filed at 10/09/2021 1300 Gross per 24 hour  Intake 480 ml  Output 600 ml  Net -120 ml    11/10 1901 - 11/12 0700 In: 254.6 [I.V.:254.6] Out: 2400 [Urine:2400]  Filed Weights   10/06/21 0812 10/06/21 1542  Weight: 50.3 kg 50.8 kg    Data Reviewed: Basic Metabolic Panel: Recent Labs  Lab 10/06/21 0925 10/07/21 0334 10/08/21 0220  NA 129* 130* 133*  K 4.9 4.5 4.4  CL 97* 98 99  CO2 26 25 28   GLUCOSE 139* 172* 126*  BUN 17 10 13   CREATININE 0.84 0.80 0.97  CALCIUM 9.4 8.4*  8.3*   Liver Function Tests: No results for input(s): AST, ALT, ALKPHOS, BILITOT, PROT, ALBUMIN in the last 168 hours. No results for input(s): LIPASE, AMYLASE in the last 168 hours. No results for input(s): AMMONIA in the last 168 hours. CBC: Recent Labs  Lab 10/06/21 0925 10/07/21 0334  WBC 11.5* 14.1*  NEUTROABS 8.5*  --   HGB 12.5 10.2*  HCT 37.2 30.2*  MCV 91.6 90.7  PLT 324 278   Cardiac Enzymes: No results for input(s): CKTOTAL, CKMB, CKMBINDEX, TROPONINI in the last 168 hours. BNP (last 3 results) No results for input(s): BNP in the last 8760 hours.  ProBNP (last 3 results) No results for input(s): PROBNP in the last 8760 hours.  CBG: No results for input(s): GLUCAP in the last 168 hours.      Radiology Reports  DG HIP PORT UNILAT WITH PELVIS 1V RIGHT  Result Date: 10/07/2021 CLINICAL DATA:  Right hip replacement EXAM: DG HIP (WITH OR WITHOUT PELVIS) 1V PORT RIGHT COMPARISON:  10/06/2020 FINDINGS: Right hip hemiarthroplasty. Prosthesis in satisfactory position. No complication or acute fracture. IMPRESSION: Satisfactory right hip hemiarthroplasty Electronically Signed   By: Marlan Palau M.D.   On: 10/07/2021 16:36       Antibiotics: Anti-infectives (From admission, onward)    Start     Dose/Rate Route Frequency Ordered Stop   10/07/21 0030  ceFAZolin (ANCEF) IVPB 2g/100 mL premix        2 g 200 mL/hr over 30 Minutes Intravenous Every 8 hours 10/06/21 2039 10/08/21 0959         DVT prophylaxis: Lovenox  Code Status: DNR  Family Communication: No family at bedside   Consultants: Orthopedics  Procedures: Hemiarthroplasty right hip    Objective    Physical Examination:   General-appears in no acute distress Heart-S1-S2, regular, no murmur auscultated Lungs-clear to auscultation bilaterally, no wheezing or crackles auscultated Abdomen-soft, nontender, no organomegaly Extremities-no edema in the lower extremities Neuro-alert, oriented x3, no focal deficit noted  Status is: Inpatient  Dispo: The patient is from: Home              Anticipated d/c is to: Skilled nursing facility              Anticipated d/c date is: 10/10/2021              Patient currently not stable for discharge  Barrier to discharge-awaiting bed at skilled nursing facility  COVID-19 Labs  No results for input(s): DDIMER, FERRITIN, LDH, CRP in the last 72 hours.  Lab Results  Component Value Date   SARSCOV2NAA NEGATIVE 10/06/2021            Recent Results (from the past 240 hour(s))  Resp Panel by RT-PCR (Flu A&B, Covid) Nasopharyngeal Swab     Status: None   Collection Time: 10/06/21  9:25 AM   Specimen: Nasopharyngeal Swab; Nasopharyngeal(NP) swabs in vial  transport medium  Result Value Ref Range Status   SARS Coronavirus 2 by RT PCR NEGATIVE NEGATIVE Final    Comment: (NOTE) SARS-CoV-2 target nucleic acids are NOT DETECTED.  The SARS-CoV-2 RNA is generally detectable in upper respiratory specimens during the acute phase of infection. The lowest concentration of SARS-CoV-2 viral copies this assay can detect is 138 copies/mL. A negative result does not preclude SARS-Cov-2 infection and should not be used as the sole basis for treatment or other patient management decisions. A negative result may occur with  improper specimen collection/handling, submission of specimen other than  nasopharyngeal swab, presence of viral mutation(s) within the areas targeted by this assay, and inadequate number of viral copies(<138 copies/mL). A negative result must be combined with clinical observations, patient history, and epidemiological information. The expected result is Negative.  Fact Sheet for Patients:  EntrepreneurPulse.com.au  Fact Sheet for Healthcare Providers:  IncredibleEmployment.be  This test is no t yet approved or cleared by the [place] FDA and  has been authorized for detection and/or diagnosis of SARS-CoV-2 by FDA under an Emergency Use Authorization (EUA). This EUA will remain  in effect (meaning this test can be used) for the duration of the COVID-19 declaration under Section 564(b)(1) of the Act, 21 U.S.C.section 360bbb-3(b)(1), unless the authorization is terminated  or revoked sooner.       Influenza A by PCR NEGATIVE NEGATIVE Final   Influenza B by PCR NEGATIVE NEGATIVE Final    Comment: (NOTE) The Xpert Xpress SARS-CoV-2/FLU/RSV plus assay is intended as an aid in the diagnosis of influenza from Nasopharyngeal swab specimens and should not be used as a sole basis for treatment. Nasal washings and aspirates are unacceptable for Xpert Xpress SARS-CoV-2/FLU/RSV testing.  Fact  Sheet for Patients: EntrepreneurPulse.com.au  Fact Sheet for Healthcare Providers: IncredibleEmployment.be  This test is not yet approved or cleared by the [place] FDA and has been authorized for detection and/or diagnosis of SARS-CoV-2 by FDA under an Emergency Use Authorization (EUA). This EUA will remain in effect (meaning this test can be used) for the duration of the COVID-19 declaration under Section 564(b)(1) of the Act, 21 U.S.C. section 360bbb-3(b)(1), unless the authorization is terminated or revoked.  Performed at [facility], [place] [address], [place], [place] [address]     Oswald Hillock   [facility] If 7PM-7AM, please contact night-coverage at [email or web address], Office  [phone]   10/09/2021, 4:03 PM  LOS: 3 days

## 2021-10-10 LAB — BASIC METABOLIC PANEL
Anion gap: 8 (ref 5–15)
BUN: 19 mg/dL (ref 8–23)
CO2: 26 mmol/L (ref 22–32)
Calcium: 8.5 mg/dL — ABNORMAL LOW (ref 8.9–10.3)
Chloride: 95 mmol/L — ABNORMAL LOW (ref 98–111)
Creatinine, Ser: 0.97 mg/dL (ref 0.44–1.00)
GFR, Estimated: 57 mL/min — ABNORMAL LOW (ref >60)
Glucose, Bld: 110 mg/dL — ABNORMAL HIGH (ref 70–99)
Potassium: 4.4 mmol/L (ref 3.5–5.1)
Sodium: 129 mmol/L — ABNORMAL LOW (ref 135–145)

## 2021-10-10 MED ORDER — ACETAMINOPHEN 500 MG PO TABS
1000.0000 mg | ORAL_TABLET | Freq: Three times a day (TID) | ORAL | Status: DC
  Administered 2021-10-10 – 2021-10-12 (×7): 1000 mg via ORAL
  Filled 2021-10-10 (×7): qty 2

## 2021-10-10 NOTE — Progress Notes (Signed)
[facility]  PROGRESS NOTE  Tiffany Thornton [id number] DOB: 30-May-1933 DOA: 10/06/2021 PCP: Gaynelle Arabian, MD   Brief HPI:   87 year old female with medical history of hypothyroidism, hypertension, hyperlipidemia, anxiety, ICH presented with right hip pain after a fall.  She was in normal state of health that morning.  She got up from the bed was trying to move to the living room.  As she was walking, lost her balance and fell on her right side. In the ED she was found to have femoral neck fracture.  Orthopedics was consulted.    Subjective   Patient seen and examined, denies any complaints.  Hand tremors has improved after starting propranolol.   Assessment/Plan:    Right femoral neck fracture -Patient underwent hemiarthroplasty yesterday -Orthopedics has signed off -Lovenox 40 mg subcu daily for 4 weeks for DVT prophylaxis -Patient to go to skilled nursing facility for rehab  Hypertension -Blood pressure is stable -Metoprolol changed to propanolol LA 80 mg daily  Hyperlipidemia -Continue statin  Hypothyroidism -Continue Synthroid  Hyponatremia -Mild, sodium 129 today -Check TSH-0.489, serum osmolality-281 -Patient is on HCTZ at home; HCTZ currently on hold  Right upper extremity action tremor -Patient says that she has right upper extremity tremor especially when she starts to use her hand -This started after patient had craniotomy for intracranial hemorrhage in 2019 -She can follow-up with neurosurgery as outpatient -In the meantime her metoprolol was changed to propranolol LA 80 mg daily with some improvement in hand tremor -Also patient's TSH is on the lower side 0.489, the dose of Synthroid was cut down to to 75 mcg to see if this helps her tremor   DVT prophylaxis -Patient is on Lovenox 40 mg subcu daily for DVT prophylaxis for 4 weeks   Scheduled medications:    acetaminophen  1,000 mg Oral Q8H   aspirin EC  81 mg Oral Daily   enoxaparin  40 mg  Subcutaneous Q24H   feeding supplement  237 mL Oral BID BM   levothyroxine  75 mcg Oral QAC breakfast   multivitamin with minerals  1 tablet Oral Daily   propranolol ER  80 mg Oral Daily   rosuvastatin  10 mg Oral q1800     Data Reviewed:   CBG:  No results for input(s): GLUCAP in the last 168 hours.  SpO2: 98 %    Vitals:   10/08/21 2051 10/09/21 0529 10/09/21 0754 10/10/21 0513  BP: (!) 114/42 (!) 165/70 140/60 (!) 143/66  Pulse: 61 74 67 69  Resp:   18   Temp: 97.8 F (36.6 C) 97.8 F (36.6 C) 98.8 F (37.1 C) 98 F (36.7 C)  TempSrc:   Oral Oral  SpO2: 93% 97% 99% 98%  Weight:      Height:         Intake/Output Summary (Last 24 hours) at 10/10/2021 1501 Last data filed at 10/10/2021 0900 Gross per 24 hour  Intake 240 ml  Output 1000 ml  Net -760 ml    11/11 1901 - 11/13 0700 In: 960 [P.O.:960] Out: 600 [Urine:600]  Filed Weights   10/06/21 0812 10/06/21 1542  Weight: 50.3 kg 50.8 kg    Data Reviewed: Basic Metabolic Panel: Recent Labs  Lab 10/06/21 0925 10/07/21 0334 10/08/21 0220 10/10/21 0131  NA 129* 130* 133* 129*  K 4.9 4.5 4.4 4.4  CL 97* 98 99 95*  CO2 26 25 28 26   GLUCOSE 139* 172* 126* 110*  BUN 17 10 13  19  CREATININE 0.84 0.80 0.97 0.97  CALCIUM 9.4 8.4* 8.3* 8.5*   Liver Function Tests: No results for input(s): AST, ALT, ALKPHOS, BILITOT, PROT, ALBUMIN in the last 168 hours. No results for input(s): LIPASE, AMYLASE in the last 168 hours. No results for input(s): AMMONIA in the last 168 hours. CBC: Recent Labs  Lab 10/06/21 0925 10/07/21 0334  WBC 11.5* 14.1*  NEUTROABS 8.5*  --   HGB 12.5 10.2*  HCT 37.2 30.2*  MCV 91.6 90.7  PLT 324 278   Cardiac Enzymes: No results for input(s): CKTOTAL, CKMB, CKMBINDEX, TROPONINI in the last 168 hours. BNP (last 3 results) No results for input(s): BNP in the last 8760 hours.  ProBNP (last 3 results) No results for input(s): PROBNP in the last 8760 hours.  CBG: No results  for input(s): GLUCAP in the last 168 hours.     Radiology Reports  No results found.     Antibiotics: Anti-infectives (From admission, onward)    Start     Dose/Rate Route Frequency Ordered Stop   10/07/21 0030  ceFAZolin (ANCEF) IVPB 2g/100 mL premix        2 g 200 mL/hr over 30 Minutes Intravenous Every 8 hours 10/06/21 2039 10/08/21 0959         DVT prophylaxis: Lovenox  Code Status: DNR  Family Communication: No family at bedside   Consultants: Orthopedics  Procedures: Hemiarthroplasty right hip    Objective    Physical Examination:   General-appears in no acute distress Heart-S1-S2, regular, no murmur auscultated Lungs-clear to auscultation bilaterally, no wheezing or crackles auscultated Abdomen-soft, nontender, no organomegaly Extremities-no edema in the lower extremities Neuro-alert, oriented x3, no focal deficit noted  Status is: Inpatient  Dispo: The patient is from: Home              Anticipated d/c is to: Skilled nursing facility              Anticipated d/c date is: 10/11/2021              Patient currently not stable for discharge  Barrier to discharge-awaiting bed at skilled nursing facility  COVID-19 Labs  No results for input(s): DDIMER, FERRITIN, LDH, CRP in the last 72 hours.  Lab Results  Component Value Date   [place] NEGATIVE 10/06/2021            Recent Results (from the past 240 hour(s))  Resp Panel by RT-PCR (Flu A&B, Covid) Nasopharyngeal Swab     Status: None   Collection Time: 10/06/21  9:25 AM   Specimen: Nasopharyngeal Swab; Nasopharyngeal(NP) swabs in vial transport medium  Result Value Ref Range Status   SARS Coronavirus 2 by RT PCR NEGATIVE NEGATIVE Final    Comment: (NOTE) SARS-CoV-2 target nucleic acids are NOT DETECTED.  The SARS-CoV-2 RNA is generally detectable in upper respiratory specimens during the acute phase of infection. The lowest concentration of SARS-CoV-2 viral copies this  assay can detect is 138 copies/mL. A negative result does not preclude SARS-Cov-2 infection and should not be used as the sole basis for treatment or other patient management decisions. A negative result may occur with  improper specimen collection/handling, submission of specimen other than nasopharyngeal swab, presence of viral mutation(s) within the areas targeted by this assay, and inadequate number of viral copies(<138 copies/mL). A negative result must be combined with clinical observations, patient history, and epidemiological information. The expected result is Negative.  Fact Sheet for Patients:  EntrepreneurPulse.com.au  Fact Sheet for Healthcare Providers:  SeriousBroker.it  This test is no t yet approved or cleared by the [place] and  has been authorized for detection and/or diagnosis of SARS-CoV-2 by FDA under an Emergency Use Authorization (EUA). This EUA will remain  in effect (meaning this test can be used) for the duration of the COVID-19 declaration under Section 564(b)(1) of the Act, 21 U.S.C.section 360bbb-3(b)(1), unless the authorization is terminated  or revoked sooner.       Influenza A by PCR NEGATIVE NEGATIVE Final   Influenza B by PCR NEGATIVE NEGATIVE Final    Comment: (NOTE) The Xpert Xpress SARS-CoV-2/FLU/RSV plus assay is intended as an aid in the diagnosis of influenza from Nasopharyngeal swab specimens and should not be used as a sole basis for treatment. Nasal washings and aspirates are unacceptable for Xpert Xpress SARS-CoV-2/FLU/RSV testing.  Fact Sheet for Patients: BloggerCourse.com  Fact Sheet for Healthcare Providers: SeriousBroker.it  This test is not yet approved or cleared by the [place] FDA and has been authorized for detection and/or diagnosis of SARS-CoV-2 by FDA under an Emergency Use Authorization (EUA). This EUA will  remain in effect (meaning this test can be used) for the duration of the COVID-19 declaration under Section 564(b)(1) of the Act, 21 U.S.C. section 360bbb-3(b)(1), unless the authorization is terminated or revoked.  Performed at [facility], [address] [place], [place] [address]     Meredeth Ide   [facility] If 7PM-7AM, please contact night-coverage at [email or web address], Office  [phone]   10/10/2021, 3:01 PM  LOS: 4 days

## 2021-10-10 NOTE — TOC Progression Note (Signed)
Transition of Care [facility]) - Progression Note    Patient Details  Name: Tiffany Thornton MRN: [id number] Date of Birth: Jul 25, 1933  Transition of Care [facility]) CM/SW [place], [place] Phone Number: 10/10/2021, 10:55 AM  Clinical Narrative:     CSW met with pt at bedside to provide available offers. Pt asked about [facility] and was advised there was no admissions on the weekend, so TOC will need to follow up tomorrow for a bed offer. Pt asked that CSW follow up with her son Edd Arbour on any new information. TOC will follow up with [facility] in AM and give son available offers.  Expected Discharge Plan: Skilled Nursing Facility Barriers to Discharge: Ship broker, SNF Pending bed offer, Continued Medical Work up  Expected Discharge Plan and Services Expected Discharge Plan: [place] Choice: [place] arrangements for the past 2 months: Single Family Home                                       Social Determinants of Health (SDOH) Interventions    Readmission Risk Interventions No flowsheet data found.

## 2021-10-10 NOTE — Plan of Care (Signed)
  Problem: Education: Goal: Verbalization of understanding the information provided (i.e., activity precautions, restrictions, etc) will improve Outcome: Progressing   Problem: Activity: Goal: Ability to ambulate and perform ADLs will improve Outcome: Progressing   Problem: Pain Management: Goal: Pain level will decrease Outcome: Progressing   

## 2021-10-11 LAB — RESP PANEL BY RT-PCR (FLU A&B, COVID) ARPGX2
Influenza A by PCR: NEGATIVE
Influenza B by PCR: NEGATIVE
SARS Coronavirus 2 by RT PCR: NEGATIVE

## 2021-10-11 MED ORDER — AMLODIPINE BESYLATE 5 MG PO TABS: 5.0000 mg | ORAL_TABLET | Freq: Every day | ORAL | Status: DC

## 2021-10-11 MED ORDER — PROPRANOLOL HCL ER 60 MG PO CP24
60.0000 mg | ORAL_CAPSULE | Freq: Every day | ORAL | Status: DC
  Filled 2021-10-11: qty 1

## 2021-10-11 MED ORDER — MILK AND MOLASSES ENEMA
1.0000 | Freq: Once | RECTAL | Status: AC
Start: 2021-10-11 — End: 2021-10-11
  Administered 2021-10-11: 240 mL via RECTAL
  Filled 2021-10-11: qty 240

## 2021-10-11 MED ORDER — AMLODIPINE BESYLATE 5 MG PO TABS
5.0000 mg | ORAL_TABLET | Freq: Every day | ORAL | Status: DC
  Administered 2021-10-12: 5 mg via ORAL
  Filled 2021-10-11: qty 1

## 2021-10-11 NOTE — Care Management Important Message (Signed)
Important Message  Patient Details  Name: ROSLYN ELSE MRN: [id number] Date of Birth: 1933/10/09   Medicare Important Message Given:  Yes     Bryanne Riquelme 10/11/2021, 4:25 PM

## 2021-10-11 NOTE — TOC Progression Note (Signed)
Transition of Care [facility]) - Initial/Assessment Note    Patient Details  Name: Tiffany Thornton MRN: [id number] Date of Birth: 07/11/1933  Transition [facility] Care [facility]) CM/SW Contact:    Ralene Bathe, LCSWA Phone Number: 10/11/2021, 12:26 PM  Clinical Narrative:                 CSW spoke with Kitty in admissions at [facility].  The facility can accept the patient once insurance Berkley Harvey is received.    CSW asked TOC CMA to begin insurance auth.  Expected Discharge Plan: Skilled Nursing Facility Barriers to Discharge: Insurance Authorization, SNF Pending bed offer, Continued Medical Work up   Patient Goals and CMS Choice Patient states their goals for this hospitalization and ongoing recovery are:: Pt would like to be independent again. CMS Medicare.gov Compare Post Acute Care list provided to:: Patient Choice offered to / list presented to : Patient  Expected Discharge Plan and Services Expected Discharge Plan: Skilled Nursing Facility     Post Acute Care Choice: Skilled Nursing Facility Living arrangements for the past 2 months: Single Family Home                                      Prior Living Arrangements/Services Living arrangements for the past 2 months: Single Family Home Lives with:: Self Patient language and need for interpreter reviewed:: Yes Do you feel safe going back to the place where you live?: Yes      Need for Family Participation in Patient Care: Yes (Comment) Care giver support system in place?: Yes (comment)   Criminal Activity/Legal Involvement Pertinent to Current Situation/Hospitalization: No - Comment as needed  Activities of Daily Living Home Assistive Devices/Equipment: Eyeglasses, Environmental consultant (specify type), Shower chair with back (reading glasses) ADL Screening (condition at time of admission) Patient's cognitive ability adequate to safely complete daily activities?: Yes Is the patient deaf or have difficulty hearing?: No Does the patient have  difficulty seeing, even when wearing glasses/contacts?: No Does the patient have difficulty concentrating, remembering, or making decisions?: No Patient able to express need for assistance with ADLs?: Yes Does the patient have difficulty dressing or bathing?: No Independently performs ADLs?: No Communication: Independent Dressing (OT): Needs assistance Is this a change from baseline?: Change from baseline, expected to last >3 days Grooming: Independent Feeding: Independent Bathing: Needs assistance Is this a change from baseline?: Change from baseline, expected to last >3 days Toileting: Needs assistance Is this a change from baseline?: Change from baseline, expected to last >3days In/Out Bed: Needs assistance Is this a change from baseline?: Change from baseline, expected to last >3 days Walks in Home: Needs assistance Is this a change from baseline?: Change from baseline, expected to last >3 days Does the patient have difficulty walking or climbing stairs?: Yes Weakness of Legs: Right Weakness of Arms/Hands: Right (right hand weak since brain bleed per pt)  Permission Sought/Granted Permission sought to share information with : Family Supports Permission granted to share information with : Yes, Verbal Permission Granted  Share Information with NAME: Tamika Shropshire     Permission granted to share info w Relationship: Son  Permission granted to share info w Contact Information: [phone]  Emotional Assessment Appearance:: Appears stated age Attitude/Demeanor/Rapport: Engaged Affect (typically observed): Tearful/Crying, Anxious Orientation: : Oriented to Self, Oriented to Place, Oriented to  Time, Oriented to Situation Alcohol / Substance Use: Not Applicable Psych Involvement: No (comment)  Admission diagnosis:  Hyponatremia [E87.1] Closed right hip fracture (HCC) [S72.001A] Fall, initial encounter [W19.XXXA] Closed displaced fracture of right femoral neck (HCC)  [S72.001A] Patient Active Problem List   Diagnosis Date Noted   Closed right hip fracture (HCC) 10/06/2021   Cerebellar hemorrhage, nontraumatic (HCC) 05/07/2018   Ataxia, post-stroke    Essential hypertension    Prediabetes    Hypokalemia    Dizziness and giddiness    Cognitive deficit, post-stroke    Hypertensive emergency    Hyperlipidemia    Cerebellar edema (HCC)    Leukocytosis    S/P craniotomy 04/30/2018   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 AT [address] OF [facility] [address] [place] [address] Phone: [phone] Fax: [phone]     Social Determinants of Health (SDOH) Interventions    Readmission Risk Interventions No flowsheet data found.

## 2021-10-11 NOTE — Progress Notes (Signed)
Physical Therapy Treatment Patient Details Name: Tiffany Thornton MRN: [id number] DOB: [birth year] Today's Date: 10/11/2021   History of Present Illness 87 yo female presents to [facility] on 11/9 from [facility] independent [facility] after fall while walking. Pt sustained R displaced femoral neck fracture, s/p R hemiarthroplasty posterolateral approach. PMH includes HLD, hypothyroidism, CVA with chronic R deficits, craniotomy for evacuation of SDH 2019, most recent fall 2.5 years ago.    PT Comments    Patient progressing well towards PT goals. Requires less assist for mobility today, Mod A to stand from EOB and Min A for gait training with use of RW for support. Continues to fatigue needing standing rest break but improved distance from prior session. Tolerated there ex sitting in chair. Able to recall 3/3 posterior hip precautions this session but needs cues to adhere to them during functional mobility. Eager to get in the shower and to rehab. Will follow.    Recommendations for follow up therapy are one component of a multi-disciplinary discharge planning process, led by the attending physician.  Recommendations may be updated based on patient status, additional functional criteria and insurance authorization.  Follow Up Recommendations  Skilled nursing-short term rehab (<3 hours/day)     Assistance Recommended at Discharge Frequent or constant Supervision/Assistance  Equipment Recommendations  None recommended by PT    Recommendations for Other Services       Precautions / Restrictions Precautions Precautions: Fall;Posterior Hip Precaution Booklet Issued: No Required Braces or Orthoses: Other Brace Other Brace: hip abduction pillow Restrictions Weight Bearing Restrictions: Yes RLE Weight Bearing: Weight bearing as tolerated     Mobility  Bed Mobility Overal bed mobility: Needs Assistance Bed Mobility: Supine to Sit     Supine to sit: Mod assist;HOB elevated     General bed mobility  comments: Mod A to bring LE's to EOB. Cues to adhere to precautions on RLE.    Transfers Overall transfer level: Needs assistance Equipment used: Rolling walker (2 wheels) Transfers: Sit to/from Stand Sit to Stand: Mod assist           General transfer comment: Mod A to power to standing with cues for hand placement/technique. Transferred to chair post ambulation.    Ambulation/Gait Ambulation/Gait assistance: Min assist Gait Distance (Feet): 50 Feet Assistive device: Rolling walker (2 wheels) Gait Pattern/deviations: Step-to pattern;Antalgic;Step-through pattern Gait velocity: decreased     General Gait Details: Step to progressing to step through gait with decreased stance time RLE, slow and guarded with cues to increase step lengths. 1 standing rest break.   Stairs             Wheelchair Mobility    Modified Rankin (Stroke Patients Only)       Balance Overall balance assessment: Needs assistance;History of Falls Sitting-balance support: No upper extremity supported;Feet supported Sitting balance-Leahy Scale: Fair     Standing balance support: During functional activity Standing balance-Leahy Scale: Poor Standing balance comment: reliant on external assist                            Cognition Arousal/Alertness: Awake/alert Behavior During Therapy: WFL for tasks assessed/performed Overall Cognitive Status: Within Functional Limits for tasks assessed                                          Exercises Total Joint Exercises Ankle Circles/Pumps:  AROM;Both;10 reps;Seated Quad Sets: AROM;Both;10 reps;Seated (5 sec hold) Heel Slides: AROM;Right;Seated;10 reps Hip ABduction/ADduction: AROM;Right;10 reps;Seated    General Comments        Pertinent Vitals/Pain Pain Assessment: Faces Faces Pain Scale: Hurts even more Pain Location: hip Pain Descriptors / Indicators: Sore;Discomfort;Grimacing;Guarding Pain Intervention(s):  Monitored during session;Limited activity within patient's tolerance;Repositioned    Home Living                          Prior Function            PT Goals (current goals can now be found in the care plan section) Progress towards PT goals: Progressing toward goals    Frequency    Min 3X/week      PT Plan Current plan remains appropriate    Co-evaluation              AM-PAC PT "6 Clicks" Mobility   Outcome Measure  Help needed turning from your back to your side while in a flat bed without using bedrails?: A Little Help needed moving from lying on your back to sitting on the side of a flat bed without using bedrails?: A Lot Help needed moving to and from a bed to a chair (including a wheelchair)?: A Lot Help needed standing up from a chair using your arms (e.g., wheelchair or bedside chair)?: A Lot Help needed to walk in hospital room?: A Little Help needed climbing 3-5 steps with a railing? : A Lot 6 Click Score: 14    End of Session Equipment Utilized During Treatment: Gait belt Activity Tolerance: Patient tolerated treatment well Patient left: in chair;with call bell/phone within reach;with chair alarm set Nurse Communication: Mobility status PT Visit Diagnosis: Other abnormalities of gait and mobility (R26.89);Muscle weakness (generalized) (M62.81);Pain Pain - Right/Left: Right Pain - part of body: Hip     Time: 1201-1223 PT Time Calculation (min) (ACUTE ONLY): 22 min  Charges:  $Gait Training: 8-22 mins                     Vale Haven, PT, DPT Acute Rehabilitation Services Pager [phone] Office [phone]      Blake Divine A Chaya Dehaan 10/11/2021, 1:16 PM

## 2021-10-11 NOTE — Progress Notes (Signed)
Occupational Therapy Treatment Patient Details Name: Tiffany Thornton MRN: [id number] DOB: 09-16-1933 Today's Date: 10/11/2021   History of present illness 87 yo female presents to [facility] on 11/9 from [facility] after fall while walking. Pt sustained R displaced femoral neck fracture, s/p R hemiarthroplasty posterolateral approach. PMH includes HLD, hypothyroidism, CVA with chronic R deficits, craniotomy for evacuation of SDH 2019, most recent fall 2.5 years ago.   OT comments  Pt in chair requesting to return to bed, denies significant pain at this time. Pt mod-max A for stand-pivot transfer, min-mod A for bed mobility, requiring assistance bringing LE's onto bed due to pain. Doffed gown sitting unsupported EOB with supervision, max A for LE dressing at bed level. Pt still presenting with decreased balance, activity tolerance, and pain limiting her ability to perform ADLs and functional mobility, will continue to follow acutely. Pt to benefit from SNF at d/c.   Recommendations for follow up therapy are one component of a multi-disciplinary discharge planning process, led by the attending physician.  Recommendations may be updated based on patient status, additional functional criteria and insurance authorization.    Follow Up Recommendations  Skilled nursing-short term rehab (<3 hours/day)    Assistance Recommended at Discharge Intermittent Supervision/Assistance  Equipment Recommendations  None recommended by OT    Recommendations for Other Services PT consult    Precautions / Restrictions Precautions Precautions: Fall;Posterior Hip Precaution Booklet Issued: No Precaution Comments: pt in bed with abduction pillow in place Required Braces or Orthoses: Other Brace Other Brace: hip abduction pillow Restrictions Weight Bearing Restrictions: Yes RLE Weight Bearing: Weight bearing as tolerated       Mobility Bed Mobility Overal bed mobility: Needs Assistance Bed  Mobility: Sidelying to Sit;Supine to Sit     Supine to sit: Min assist;Mod assist     General bed mobility comments: min-mod A to bring LE's onto bed due to pain    Transfers Overall transfer level: Needs assistance Equipment used: Rolling walker (2 wheels) Transfers: Sit to/from Stand;Bed to chair/wheelchair/BSC Sit to Stand: Mod assist Stand pivot transfers: Mod assist;Max assist         General transfer comment: cues and physical assistance to block R knee from coming inward     Balance Overall balance assessment: Needs assistance;History of Falls Sitting-balance support: No upper extremity supported;Feet supported Sitting balance-Leahy Scale: Good Sitting balance - Comments: able to sit EOB without support   Standing balance support: During functional activity Standing balance-Leahy Scale: Poor Standing balance comment: reliant on external assist                           ADL either performed or assessed with clinical judgement   ADL                   Upper Body Dressing : Supervision/safety;Sitting   Lower Body Dressing: Maximal assistance;Bed level   Toilet Transfer: Moderate assistance Toilet Transfer Details (indicate cue type and reason): simulated toilet transfer from chair > bed           General ADL Comments: pt able to heel-toe pivot with feet to complete stand-pivot transfer    Extremity/Trunk Assessment Upper Extremity Assessment Upper Extremity Assessment: Overall WFL for tasks assessed RUE Deficits / Details: 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   Lower Extremity Assessment Lower Extremity Assessment: Defer to PT evaluation        Vision   Vision Assessment?: No apparent  visual deficits   Perception Perception Perception: Within Functional Limits   Praxis Praxis Praxis: Not tested    Cognition Arousal/Alertness: Awake/alert Behavior During Therapy: WFL for tasks assessed/performed Overall Cognitive Status:  Within Functional Limits for tasks assessed                                            Exercises Total Joint Exercises Ankle Circles/Pumps: AROM;Both;10 reps;Seated Quad Sets: AROM;Both;10 reps;Seated (5 sec hold) Heel Slides: AROM;Right;Seated;10 reps Hip ABduction/ADduction: AROM;Right;10 reps;Seated   Shoulder Instructions       General Comments      Pertinent Vitals/ Pain       Pain Assessment: Faces Pain Score: 1  Faces Pain Scale: Hurts a little bit Pain Location: R hip Pain Descriptors / Indicators: Sore;Discomfort;Grimacing;Guarding Pain Intervention(s): Limited activity within patient's tolerance;Monitored during session;Repositioned  Home Living                                          Prior Functioning/Environment              Frequency  Min 2X/week        Progress Toward Goals  OT Goals(current goals can now be found in the care plan section)  Progress towards OT goals: Progressing toward goals  Acute Rehab OT Goals Patient Stated Goal: return home OT Goal Formulation: With patient Time For Goal Achievement: 10/21/21 Potential to Achieve Goals: Good  Plan Discharge plan remains appropriate;Frequency remains appropriate    Co-evaluation          OT goals addressed during session: ADL's and self-care      AM-PAC OT "6 Clicks" Daily Activity     Outcome Measure   Help from another person eating meals?: None Help from another person taking care of personal grooming?: A Little Help from another person toileting, which includes using toliet, bedpan, or urinal?: A Little Help from another person bathing (including washing, rinsing, drying)?: A Lot Help from another person to put on and taking off regular upper body clothing?: A Little Help from another person to put on and taking off regular lower body clothing?: A Lot 6 Click Score: 17    End of Session Equipment Utilized During Treatment: Gait  belt  OT Visit Diagnosis: Unsteadiness on feet (R26.81);Repeated falls (R29.6);Other abnormalities of gait and mobility (R26.89);Muscle weakness (generalized) (M62.81);History of falling (Z91.81);Pain;Ataxia, unspecified (R27.0)   Activity Tolerance Patient tolerated treatment well;No increased pain   Patient Left in bed;with call bell/phone within reach;with bed alarm set   Nurse Communication Mobility status        Time: 4166-0630 OT Time Calculation (min): 17 min  Charges: OT General Charges $OT Visit: 1 Visit OT Treatments $Self Care/Home Management : 8-22 mins  Alfonzo Beers, OTD, OTR/L Acute Rehab [phone]) [phone]   Mayer Masker 10/11/2021, 2:25 PM

## 2021-10-11 NOTE — Progress Notes (Signed)
[facility]  PROGRESS NOTE  Tiffany Thornton TDH:[id number] DOB: 01/14/1933 DOA: 10/06/2021 PCP: Blair Heys, MD   Brief HPI:   87 year old female with medical history of hypothyroidism, hypertension, hyperlipidemia, anxiety, ICH presented with right hip pain after a fall.  She was in normal state of health that morning.  She got up from the bed was trying to move to the living room.  As she was walking, lost her balance and fell on her right side. In the ED she was found to have femoral neck fracture.  Orthopedics was consulted.    Subjective   Patient seen and examined, had episode of dizziness while sitting on commode.  Metoprolol was switched to propanolol yesterday to help with tremors in right upper extremity.     Assessment/Plan:    Right femoral neck fracture -Patient underwent hemiarthroplasty yesterday -Orthopedics has signed off -Lovenox 40 mg subcu daily for 4 weeks for DVT prophylaxis -Patient to go to skilled nursing facility for rehab  Dizziness/presyncope -Patient said that she had dizziness episode while getting up from commode -?  Orthostatic hypotension; will check orthostatic vital signs -She was started on propranolol as above -We will cut down the dose of propranolol to 60 mg daily  Hypertension -Blood pressure is stable -Metoprolol changed to propanolol LA 80 mg daily -Heart rate has been upper 50s and low 60s -We will cut down the dose of propranolol LA to 60 mg daily due to bradycardia and possible orthostatic hypotension -Blood pressure is still mildly elevated, will start amlodipine 5 mg daily from tomorrow morning  Hyperlipidemia -Continue statin  Hypothyroidism -Continue Synthroid  Hyponatremia -Mild, sodium 129 today -Check TSH-0.489, serum osmolality-281 -Patient is on HCTZ at home; HCTZ currently on hold  Right upper extremity action tremor -Patient says that she has right upper extremity tremor especially when she starts to use  her hand -This started after patient had craniotomy for intracranial hemorrhage in 2019 -She can follow-up with neurosurgery as outpatient -In the meantime her metoprolol was changed to propranolol LA 80 mg daily with some improvement in hand tremor -Also patient's TSH is on the lower side 0.489, the dose of Synthroid was cut down to to 75 mcg to see if this helps her tremor   DVT prophylaxis -Patient is on Lovenox 40 mg subcu daily for DVT prophylaxis for 4 weeks   Scheduled medications:    acetaminophen  1,000 mg Oral Q8H   [START ON 10/12/2021] amLODipine  5 mg Oral Daily   aspirin EC  81 mg Oral Daily   enoxaparin  40 mg Subcutaneous Q24H   feeding supplement  237 mL Oral BID BM   levothyroxine  75 mcg Oral QAC breakfast   milk and molasses  1 enema Rectal Once   multivitamin with minerals  1 tablet Oral Daily   [START ON 10/12/2021] propranolol ER  60 mg Oral Daily   rosuvastatin  10 mg Oral q1800     Data Reviewed:   CBG:  No results for input(s): GLUCAP in the last 168 hours.  SpO2: 98 %    Vitals:   10/10/21 1500 10/10/21 2056 10/11/21 0453 10/11/21 0825  BP: (!) 104/58 (!) 114/42 (!) 137/50 (!) 156/59  Pulse:  71 (!) 57 63  Resp: 20 16 16 17   Temp:  98.1 F (36.7 C) 98.3 F (36.8 C) 97.7 F (36.5 C)  TempSrc:  Oral Oral   SpO2: 98% 93% 97% 98%  Weight:      Height:  Intake/Output Summary (Last 24 hours) at 10/11/2021 1614 Last data filed at 10/11/2021 1235 Gross per 24 hour  Intake 240 ml  Output 2250 ml  Net -2010 ml    11/12 1901 - 11/14 0700 In: 720 [P.O.:720] Out: 2550 [Urine:2550]  Filed Weights   10/06/21 0812 10/06/21 1542  Weight: 50.3 kg 50.8 kg    Data Reviewed: Basic Metabolic Panel: Recent Labs  Lab 10/06/21 0925 10/07/21 0334 10/08/21 0220 10/10/21 0131  NA 129* 130* 133* 129*  K 4.9 4.5 4.4 4.4  CL 97* 98 99 95*  CO2 26 25 28 26   GLUCOSE 139* 172* 126* 110*  BUN 17 10 13 19   CREATININE 0.84 0.80 0.97 0.97   CALCIUM 9.4 8.4* 8.3* 8.5*   Liver Function Tests: No results for input(s): AST, ALT, ALKPHOS, BILITOT, PROT, ALBUMIN in the last 168 hours. No results for input(s): LIPASE, AMYLASE in the last 168 hours. No results for input(s): AMMONIA in the last 168 hours. CBC: Recent Labs  Lab 10/06/21 0925 10/07/21 0334  WBC 11.5* 14.1*  NEUTROABS 8.5*  --   HGB 12.5 10.2*  HCT 37.2 30.2*  MCV 91.6 90.7  PLT 324 278   Cardiac Enzymes: No results for input(s): CKTOTAL, CKMB, CKMBINDEX, TROPONINI in the last 168 hours. BNP (last 3 results) No results for input(s): BNP in the last 8760 hours.  ProBNP (last 3 results) No results for input(s): PROBNP in the last 8760 hours.  CBG: No results for input(s): GLUCAP in the last 168 hours.     Radiology Reports  No results found.     Antibiotics: Anti-infectives (From admission, onward)    Start     Dose/Rate Route Frequency Ordered Stop   10/07/21 0030  ceFAZolin (ANCEF) IVPB 2g/100 mL premix        2 g 200 mL/hr over 30 Minutes Intravenous Every 8 hours 10/06/21 2039 10/08/21 0959         DVT prophylaxis: Lovenox  Code Status: DNR  Family Communication: No family at bedside   Consultants: Orthopedics  Procedures: Hemiarthroplasty right hip    Objective    Physical Examination:   General-appears in no acute distress Heart-S1-S2, regular, no murmur auscultated Lungs-clear to auscultation bilaterally, no wheezing or crackles auscultated Abdomen-soft, nontender, no organomegaly Extremities-no edema in the lower extremities Neuro-alert, oriented x3, no focal deficit noted  Status is: Inpatient  Dispo: The patient is from: Home              Anticipated d/c is to: Skilled nursing facility              Anticipated d/c date is: 10/11/2021              Patient currently not stable for discharge  Barrier to discharge-awaiting bed at skilled nursing facility  COVID-19 Labs  No results for input(s): DDIMER,  FERRITIN, LDH, CRP in the last 72 hours.  Lab Results  Component Value Date   [place] NEGATIVE 10/06/2021            Recent Results (from the past 240 hour(s))  Resp Panel by RT-PCR (Flu A&B, Covid) Nasopharyngeal Swab     Status: None   Collection Time: 10/06/21  9:25 AM   Specimen: Nasopharyngeal Swab; Nasopharyngeal(NP) swabs in vial transport medium  Result Value Ref Range Status   SARS Coronavirus 2 by RT PCR NEGATIVE NEGATIVE Final    Comment: (NOTE) SARS-CoV-2 target nucleic acids are NOT DETECTED.  The SARS-CoV-2 RNA is generally  detectable in upper respiratory specimens during the acute phase of infection. The lowest concentration of SARS-CoV-2 viral copies this assay can detect is 138 copies/mL. A negative result does not preclude SARS-Cov-2 infection and should not be used as the sole basis for treatment or other patient management decisions. A negative result may occur with  improper specimen collection/handling, submission of specimen other than nasopharyngeal swab, presence of viral mutation(s) within the areas targeted by this assay, and inadequate number of viral copies(<138 copies/mL). A negative result must be combined with clinical observations, patient history, and epidemiological information. The expected result is Negative.  Fact Sheet for Patients:  BloggerCourse.com  Fact Sheet for Healthcare Providers:  SeriousBroker.it  This test is no t yet approved or cleared by the [place] FDA and  has been authorized for detection and/or diagnosis of SARS-CoV-2 by FDA under an Emergency Use Authorization (EUA). This EUA will remain  in effect (meaning this test can be used) for the duration of the COVID-19 declaration under Section 564(b)(1) of the Act, 21 U.S.C.section 360bbb-3(b)(1), unless the authorization is terminated  or revoked sooner.       Influenza A by PCR NEGATIVE NEGATIVE Final    Influenza B by PCR NEGATIVE NEGATIVE Final    Comment: (NOTE) The Xpert Xpress SARS-CoV-2/FLU/RSV plus assay is intended as an aid in the diagnosis of influenza from Nasopharyngeal swab specimens and should not be used as a sole basis for treatment. Nasal washings and aspirates are unacceptable for Xpert Xpress SARS-CoV-2/FLU/RSV testing.  Fact Sheet for Patients: BloggerCourse.com  Fact Sheet for Healthcare Providers: SeriousBroker.it  This test is not yet approved or cleared by the [place] FDA and has been authorized for detection and/or diagnosis of SARS-CoV-2 by FDA under an Emergency Use Authorization (EUA). This EUA will remain in effect (meaning this test can be used) for the duration of the COVID-19 declaration under Section 564(b)(1) of the Act, 21 U.S.C. section 360bbb-3(b)(1), unless the authorization is terminated or revoked.  Performed at [facility], [address] [place], [place] [address]     Meredeth Ide   [facility] If 7PM-7AM, please contact night-coverage at [email or web address], Office  [phone]   10/11/2021, 4:14 PM  LOS: 5 days

## 2021-10-12 DIAGNOSIS — R2681 Unsteadiness on feet: Secondary | ICD-10-CM | POA: Diagnosis not present

## 2021-10-12 DIAGNOSIS — R279 Unspecified lack of coordination: Secondary | ICD-10-CM | POA: Diagnosis not present

## 2021-10-12 DIAGNOSIS — S72001A Fracture of unspecified part of neck of right femur, initial encounter for closed fracture: Secondary | ICD-10-CM | POA: Diagnosis not present

## 2021-10-12 DIAGNOSIS — D62 Acute posthemorrhagic anemia: Secondary | ICD-10-CM | POA: Diagnosis not present

## 2021-10-12 DIAGNOSIS — R1312 Dysphagia, oropharyngeal phase: Secondary | ICD-10-CM | POA: Diagnosis not present

## 2021-10-12 DIAGNOSIS — Z4789 Encounter for other orthopedic aftercare: Secondary | ICD-10-CM | POA: Diagnosis not present

## 2021-10-12 DIAGNOSIS — E039 Hypothyroidism, unspecified: Secondary | ICD-10-CM | POA: Diagnosis not present

## 2021-10-12 DIAGNOSIS — G252 Other specified forms of tremor: Secondary | ICD-10-CM | POA: Diagnosis not present

## 2021-10-12 DIAGNOSIS — R410 Disorientation, unspecified: Secondary | ICD-10-CM | POA: Diagnosis not present

## 2021-10-12 DIAGNOSIS — M2550 Pain in unspecified joint: Secondary | ICD-10-CM | POA: Diagnosis not present

## 2021-10-12 DIAGNOSIS — Z7401 Bed confinement status: Secondary | ICD-10-CM | POA: Diagnosis not present

## 2021-10-12 DIAGNOSIS — I10 Essential (primary) hypertension: Secondary | ICD-10-CM | POA: Diagnosis not present

## 2021-10-12 DIAGNOSIS — Z9889 Other specified postprocedural states: Secondary | ICD-10-CM

## 2021-10-12 DIAGNOSIS — I69391 Dysphagia following cerebral infarction: Secondary | ICD-10-CM | POA: Diagnosis not present

## 2021-10-12 DIAGNOSIS — R42 Dizziness and giddiness: Secondary | ICD-10-CM | POA: Diagnosis not present

## 2021-10-12 DIAGNOSIS — S72001S Fracture of unspecified part of neck of right femur, sequela: Secondary | ICD-10-CM | POA: Diagnosis not present

## 2021-10-12 DIAGNOSIS — I614 Nontraumatic intracerebral hemorrhage in cerebellum: Secondary | ICD-10-CM | POA: Diagnosis not present

## 2021-10-12 DIAGNOSIS — E871 Hypo-osmolality and hyponatremia: Secondary | ICD-10-CM | POA: Diagnosis not present

## 2021-10-12 DIAGNOSIS — M6281 Muscle weakness (generalized): Secondary | ICD-10-CM | POA: Diagnosis not present

## 2021-10-12 DIAGNOSIS — R251 Tremor, unspecified: Secondary | ICD-10-CM | POA: Diagnosis not present

## 2021-10-12 DIAGNOSIS — S72001D Fracture of unspecified part of neck of right femur, subsequent encounter for closed fracture with routine healing: Secondary | ICD-10-CM | POA: Diagnosis not present

## 2021-10-12 DIAGNOSIS — Z741 Need for assistance with personal care: Secondary | ICD-10-CM | POA: Diagnosis not present

## 2021-10-12 DIAGNOSIS — I69393 Ataxia following cerebral infarction: Secondary | ICD-10-CM | POA: Diagnosis not present

## 2021-10-12 DIAGNOSIS — E785 Hyperlipidemia, unspecified: Secondary | ICD-10-CM | POA: Diagnosis not present

## 2021-10-12 LAB — BASIC METABOLIC PANEL
Anion gap: 8 (ref 5–15)
BUN: 22 mg/dL (ref 8–23)
CO2: 25 mmol/L (ref 22–32)
Calcium: 8.4 mg/dL — ABNORMAL LOW (ref 8.9–10.3)
Chloride: 98 mmol/L (ref 98–111)
Creatinine, Ser: 0.89 mg/dL (ref 0.44–1.00)
GFR, Estimated: >60 mL/min (ref >60)
Glucose, Bld: 113 mg/dL — ABNORMAL HIGH (ref 70–99)
Potassium: 4.6 mmol/L (ref 3.5–5.1)
Sodium: 131 mmol/L — ABNORMAL LOW (ref 135–145)

## 2021-10-12 LAB — CBC
HCT: 29.1 % — ABNORMAL LOW (ref 36.0–46.0)
Hemoglobin: 9.6 g/dL — ABNORMAL LOW (ref 12.0–15.0)
MCH: 30.3 pg (ref 26.0–34.0)
MCHC: 33 g/dL (ref 30.0–36.0)
MCV: 91.8 fL (ref 80.0–100.0)
Platelets: 361 10*3/uL (ref 150–400)
RBC: 3.17 MIL/uL — ABNORMAL LOW (ref 3.87–5.11)
RDW: 13.6 % (ref 11.5–15.5)
WBC: 8.6 10*3/uL (ref 4.0–10.5)
nRBC: 0 % (ref 0.0–0.2)

## 2021-10-12 MED ORDER — SENNOSIDES-DOCUSATE SODIUM 8.6-50 MG PO TABS: 1.0000 | ORAL_TABLET | Freq: Two times a day (BID) | ORAL | Status: AC | PRN

## 2021-10-12 MED ORDER — METOPROLOL TARTRATE 25 MG PO TABS
25.0000 mg | ORAL_TABLET | Freq: Two times a day (BID) | ORAL | Status: DC
  Administered 2021-10-12: 25 mg via ORAL
  Filled 2021-10-12: qty 1

## 2021-10-12 MED ORDER — ACETAMINOPHEN 500 MG PO TABS: 1000.0000 mg | ORAL_TABLET | Freq: Three times a day (TID) | ORAL | 0 refills | Status: DC | PRN

## 2021-10-12 MED ORDER — ENOXAPARIN SODIUM 40 MG/0.4ML IJ SOSY: 40.0000 mg | PREFILLED_SYRINGE | INTRAMUSCULAR | 0 refills | Status: DC

## 2021-10-12 MED ORDER — AMLODIPINE BESYLATE 5 MG PO TABS: 5.0000 mg | ORAL_TABLET | Freq: Every day | ORAL | 3 refills | Status: DC

## 2021-10-12 MED ORDER — LEVOTHYROXINE SODIUM 75 MCG PO TABS
75.0000 ug | ORAL_TABLET | Freq: Every day | ORAL | Status: DC
Start: 2021-10-13 — End: 2021-10-29

## 2021-10-12 NOTE — TOC Transition Note (Signed)
Transition of Care [facility]) - CM/SW Discharge Note   Patient Details  Name: Tiffany Thornton MRN: [id number] Date of Birth: [birth year]  Transition of [facility]) CM/SW Contact:  Ralene Bathe, LCSWA Phone Number: 10/12/2021, 11:51 AM   Clinical Narrative:     Patient will DC to:  [facility] Anticipated DC date: 10/12/2021 Family notified: Yes Transport by: Sharin Mons   Per MD patient ready for DC to SNF. RN to call report prior to discharge [phone] room 304A. RN, patient, patient's family, and facility notified of DC. Discharge Summary and FL2 sent to facility. DC packet on chart. Ambulance transport requested for patient.   CSW will sign off for now as social work intervention is no longer needed. Please consult [place] again if new needs arise.      Barriers to Discharge: English as a second language teacher, SNF Pending bed offer, Continued Medical Work up   Patient Goals and CMS Choice Patient states their goals for this hospitalization and ongoing recovery are:: Pt would like to be independent again. CMS Medicare.gov Compare Post Acute Care list provided to:: Patient Choice offered to / list presented to : Patient  Discharge Placement                       Discharge Plan and Services     Post Acute Care Choice: Skilled Nursing Facility                               Social Determinants of Health (SDOH) Interventions     Readmission Risk Interventions No flowsheet data found.

## 2021-10-12 NOTE — Progress Notes (Signed)
[facility] here to transport pt to [facility]. 

## 2021-10-12 NOTE — Progress Notes (Signed)
Pt report given to Scientist, research (medical) for [facility].Pt going to [phone]-A.

## 2021-10-12 NOTE — Progress Notes (Signed)
Tried to call report to [facility] [phone] twice but nobody's answering .Will try again later.

## 2021-10-12 NOTE — Discharge Summary (Addendum)
Physician Discharge Summary  Tiffany Thornton YNW:[id number] DOB: June 04, 1933 DOA: 10/06/2021  PCP: Blair Heys, MD  Admit date: 10/06/2021 Discharge date: 10/12/2021  Time spent: 60 minutes  Recommendations for Outpatient Follow-up:   Follow-up orthopedic surgery in 2 weeks Check CBC in 1 week        Post op recs: WB: WBAT with posterior hip precautions x6 weeks Abx: ancef x23 hours post op Imaging: PACU xrays Dressing: Aquacel dressing to be kept intact until follow-up DVT prophylaxis: lovenox starting POD1 x4 weeks till 11/06/21 Follow up: 2 weeks after surgery for a wound check with Dr. Blanchie Dessert at [facility].  Address: [address], [place], [place] [address]   Discharge Diagnoses:  Active Problems:   Closed right hip fracture [facility])   Discharge Condition: Stable  Diet recommendation: Heart healthy diet  Filed Weights   10/06/21 0812 10/06/21 1542  Weight: 50.3 kg 50.8 kg    History of present illness:   87 year old female with medical history of hypothyroidism, hypertension, hyperlipidemia, anxiety, ICH presented with right hip pain after a fall.  She was in normal state of health that morning.  She got up from the bed was trying to move to the living room.  As she was walking, lost her balance and fell on her right side. In the ED she was found to have femoral neck fracture.  Orthopedics was consulted.    Hospital Course:   Right femoral neck fracture -Patient underwent hemiarthroplasty yesterday -Orthopedics has signed off -Lovenox 40 mg subcu daily for 4 weeks for DVT prophylaxis -Patient to go to skilled nursing facility for rehab   Dizziness/presyncope -Patient said that she had dizziness episode while getting up from commode -Denies passing out - resolved after cutting down doe of propranolol -will discontinue propranolol -We will restart metoprolol 25 mg p.o. twice daily which she was taking at home.   Hypertension -Blood  pressure is stable -We will restart home medications including metoprolol 25 mg p.o. twice daily -We will cut down the dose of amlodipine to 5 mg daily -Continue hydralazine 25 mg p.o. every 8 hours -We will discontinue HCTZ 12.5 mg daily, lisinopril 20 mg p.o. twice daily    Hyperlipidemia -Continue statin   Hypothyroidism -Continue Synthroid at lower-dose of 75 mcg daily   Hyponatremia -Mild, sodium 131 improved today -Check TSH-0.489, serum osmolality-281 -HCTZ has been discontinued   Right upper extremity action tremor -Patient says that she has right upper extremity tremor especially when she starts to use her hand -This started after patient had craniotomy for intracranial hemorrhage in 2019 -She can follow-up with neurosurgery as outpatient -dose of Synthroid changed as above  Acute blood loss anemia -Hemoglobin dropped from 12.5-9.6 -Likely dilutional and acute blood loss anemia -Follow CBC in 1 week     DVT prophylaxis -Patient is on Lovenox 40 mg subcu daily for DVT prophylaxis for 4 weeks starting 10/07/2021 until 11/06/2021      Procedures: Hemiarthroplasty  Consultations: Orthopedics  Discharge Exam: Vitals:   10/11/21 1933 10/12/21 0821  BP: (!) 154/62 (!) 151/59  Pulse: 65 60  Resp: 15 17  Temp: 98.5 F (36.9 C) 98.3 F (36.8 C)  SpO2: 95% 95%    General: Appears in no acute distress Cardiovascular: S1-S2, regular, no murmur auscultated Respiratory: Clear to auscultation bilaterally  Discharge Instructions   Discharge Instructions     Diet - low sodium heart healthy   Complete by: As directed    Increase activity  slowly   Complete by: As directed    No wound care   Complete by: As directed       Allergies as of 10/12/2021   No Known Allergies      Medication List     STOP taking these medications    hydrochlorothiazide 12.5 MG capsule Commonly known as: MICROZIDE   lisinopril 20 MG tablet Commonly known as: ZESTRIL        TAKE these medications    acetaminophen 500 MG tablet Commonly known as: TYLENOL Take 2 tablets (1,000 mg total) by mouth every 8 (eight) hours as needed.   amLODipine 5 MG tablet Commonly known as: NORVASC Take 1 tablet (5 mg total) by mouth daily. What changed:  medication strength how much to take   aspirin 81 MG tablet Take 1 tablet (81 mg total) by mouth daily.   enoxaparin 40 MG/0.4ML injection Commonly known as: LOVENOX Inject 0.4 mLs (40 mg total) into the skin daily for 25 days.   hydrALAZINE 25 MG tablet Commonly known as: APRESOLINE Take 1 tablet (25 mg total) by mouth every 8 (eight) hours.   levothyroxine 75 MCG tablet Commonly known as: SYNTHROID Take 1 tablet (75 mcg total) by mouth daily before breakfast. Start taking on: October 13, 2021 What changed:  medication strength how much to take when to take this   metoprolol tartrate 25 MG tablet Commonly known as: LOPRESSOR Take 1 tablet (25 mg total) by mouth 2 (two) times daily.   multivitamin with minerals Tabs tablet Take 1 tablet by mouth daily.   rosuvastatin 10 MG tablet Commonly known as: CRESTOR Take 10 mg by mouth daily.   senna-docusate 8.6-50 MG tablet Commonly known as: Senokot-S Take 1 tablet by mouth 2 (two) times daily as needed for mild constipation.       No Known Allergies  Follow-up Information     Joen Laura, MD Follow up in 2 week(s).   Specialty: Orthopedic Surgery Contact information: [address] [place] [address] [phone]                  The results of significant diagnostics from this hospitalization (including imaging, microbiology, ancillary and laboratory) are listed below for reference.    Significant Diagnostic Studies: DG Chest 2 View  Result Date: 10/06/2021 CLINICAL DATA:  Fall, right hip pain, initial encounter. EXAM: CHEST - 2 VIEW COMPARISON:  04/30/2018. FINDINGS: Patient is rotated. Trachea is  midline. Heart size stable. Thoracic aorta is calcified. Biapical pleuroparenchymal scarring. Lungs are otherwise clear. No pleural fluid. Degenerative changes in the spine. IMPRESSION: 1. No acute findings. 2.  Aortic atherosclerosis (ICD10-I70.0). Electronically Signed   By: Leanna Battles M.D.   On: 10/06/2021 09:27   DG Tibia/Fibula Right  Result Date: 10/06/2021 CLINICAL DATA:  Fall, pain EXAM: DG HIP (WITH OR WITHOUT PELVIS) 2-3V RIGHT; RIGHT FEMUR 2 VIEWS; RIGHT TIBIA AND FIBULA - 2 VIEW COMPARISON:  None. FINDINGS: Osteopenia. There is an impacted transcervical fracture of the right femoral neck. No fracture or dislocation of the bony pelvis or partially imaged left hip. No fracture or dislocation of the distal right femur. Vascular calcinosis. No fracture or dislocation of the right tibia or fibula. IMPRESSION: 1. Impacted transcervical fracture of the right femoral neck. 2. No fracture or dislocation of the distal right femur. 3. No fracture or dislocation of the right tibia or fibula. 4. Osteopenia. Electronically Signed   By: Bonna Gains.D.  On: 10/06/2021 09:19   CT HEAD WO CONTRAST ( )  Result Date: 10/06/2021 CLINICAL DATA:  87 year old female with history of trauma from a fall with injury to the head. EXAM: CT HEAD WITHOUT CONTRAST CT CERVICAL SPINE WITHOUT CONTRAST TECHNIQUE: Multidetector CT imaging of the head and cervical spine was performed following the standard protocol without intravenous contrast. Multiplanar CT image reconstructions of the cervical spine were also generated. COMPARISON:  Head CT 12/16/2017. FINDINGS: CT HEAD FINDINGS Brain: Mild cerebral atrophy. Patchy and confluent areas of decreased attenuation are noted throughout the deep and periventricular white matter of the cerebral hemispheres bilaterally, compatible with chronic microvascular ischemic disease. Volume loss and focal areas of low attenuation within the right cerebellar hemisphere where there is also  a dystrophic calcification, presumably areas of encephalomalacia/gliosis from prior surgery. No evidence of acute infarction, hemorrhage, hydrocephalus, extra-axial collection or mass lesion/mass effect. Vascular: No hyperdense vessel or unexpected calcification. Skull: Status post suboccipital craniectomy. Negative for fracture or focal lesion. Sinuses/Orbits: No acute finding. Other: None. CT CERVICAL SPINE FINDINGS Alignment: Normal. Skull base and vertebrae: No acute fracture. No primary bone lesion or focal pathologic process. Soft tissues and spinal canal: No prevertebral fluid or swelling. No visible canal hematoma. Disc levels: Mild multilevel degenerative disc disease, most evident at C5-C6. Mild multilevel facet arthropathy. Upper chest: Negative. Other: None. IMPRESSION: 1. No evidence of significant acute traumatic injury to the skull, brain or cervical spine. 2. Mild cerebral atrophy with chronic microvascular ischemic changes in the cerebral white matter and postoperative changes in the right cerebellar hemisphere related to prior suboccipital craniectomy and evacuation of right cerebellar hemorrhage. 3. Mild multilevel degenerative disc disease and cervical spondylosis, as above. Electronically Signed   By: Trudie Reed M.D.   On: 10/06/2021 09:31   CT Cervical Spine Wo Contrast  Result Date: 10/06/2021 CLINICAL DATA:  87 year old female with history of trauma from a fall with injury to the head. EXAM: CT HEAD WITHOUT CONTRAST CT CERVICAL SPINE WITHOUT CONTRAST TECHNIQUE: Multidetector CT imaging of the head and cervical spine was performed following the standard protocol without intravenous contrast. Multiplanar CT image reconstructions of the cervical spine were also generated. COMPARISON:  Head CT 12/16/2017. FINDINGS: CT HEAD FINDINGS Brain: Mild cerebral atrophy. Patchy and confluent areas of decreased attenuation are noted throughout the deep and periventricular white matter of the  cerebral hemispheres bilaterally, compatible with chronic microvascular ischemic disease. Volume loss and focal areas of low attenuation within the right cerebellar hemisphere where there is also a dystrophic calcification, presumably areas of encephalomalacia/gliosis from prior surgery. No evidence of acute infarction, hemorrhage, hydrocephalus, extra-axial collection or mass lesion/mass effect. Vascular: No hyperdense vessel or unexpected calcification. Skull: Status post suboccipital craniectomy. Negative for fracture or focal lesion. Sinuses/Orbits: No acute finding. Other: None. CT CERVICAL SPINE FINDINGS Alignment: Normal. Skull base and vertebrae: No acute fracture. No primary bone lesion or focal pathologic process. Soft tissues and spinal canal: No prevertebral fluid or swelling. No visible canal hematoma. Disc levels: Mild multilevel degenerative disc disease, most evident at C5-C6. Mild multilevel facet arthropathy. Upper chest: Negative. Other: None. IMPRESSION: 1. No evidence of significant acute traumatic injury to the skull, brain or cervical spine. 2. Mild cerebral atrophy with chronic microvascular ischemic changes in the cerebral white matter and postoperative changes in the right cerebellar hemisphere related to prior suboccipital craniectomy and evacuation of right cerebellar hemorrhage. 3. Mild multilevel degenerative disc disease and cervical spondylosis, as above. Electronically Signed   By: Reuel Boom  Entrikin M.D.   On: 10/06/2021 09:31   DG Pelvis Portable  Result Date: 10/06/2021 CLINICAL DATA:  Postoperative hip EXAM: PORTABLE PELVIS 1-2 VIEWS COMPARISON:  X-ray right femur 10/06/2021 FINDINGS: Partially visualized total right hip arthroplasty. There is no evidence of pelvic fracture or diastasis. No hip fracture or dislocation of left. No pelvic bone lesions are seen. Subcutaneus soft tissue edema and emphysema. IMPRESSION: Partially visualized total right hip arthroplasty with  overlying surgical changes of the soft tissues. Electronically Signed   By: Tish Frederickson M.D.   On: 10/06/2021 19:45   DG HIP PORT UNILAT WITH PELVIS 1V RIGHT  Result Date: 10/07/2021 CLINICAL DATA:  Right hip replacement EXAM: DG HIP (WITH OR WITHOUT PELVIS) 1V PORT RIGHT COMPARISON:  10/06/2020 FINDINGS: Right hip hemiarthroplasty. Prosthesis in satisfactory position. No complication or acute fracture. IMPRESSION: Satisfactory right hip hemiarthroplasty Electronically Signed   By: Marlan Palau M.D.   On: 10/07/2021 16:36   DG Hip Unilat W or Wo Pelvis 2-3 Views Right  Result Date: 10/06/2021 CLINICAL DATA:  Fall, pain EXAM: DG HIP (WITH OR WITHOUT PELVIS) 2-3V RIGHT; RIGHT FEMUR 2 VIEWS; RIGHT TIBIA AND FIBULA - 2 VIEW COMPARISON:  None. FINDINGS: Osteopenia. There is an impacted transcervical fracture of the right femoral neck. No fracture or dislocation of the bony pelvis or partially imaged left hip. No fracture or dislocation of the distal right femur. Vascular calcinosis. No fracture or dislocation of the right tibia or fibula. IMPRESSION: 1. Impacted transcervical fracture of the right femoral neck. 2. No fracture or dislocation of the distal right femur. 3. No fracture or dislocation of the right tibia or fibula. 4. Osteopenia. Electronically Signed   By: Jearld Lesch M.D.   On: 10/06/2021 09:19   DG Femur Min 2 Views Right  Result Date: 10/06/2021 CLINICAL DATA:  Fall, pain EXAM: DG HIP (WITH OR WITHOUT PELVIS) 2-3V RIGHT; RIGHT FEMUR 2 VIEWS; RIGHT TIBIA AND FIBULA - 2 VIEW COMPARISON:  None. FINDINGS: Osteopenia. There is an impacted transcervical fracture of the right femoral neck. No fracture or dislocation of the bony pelvis or partially imaged left hip. No fracture or dislocation of the distal right femur. Vascular calcinosis. No fracture or dislocation of the right tibia or fibula. IMPRESSION: 1. Impacted transcervical fracture of the right femoral neck. 2. No fracture or  dislocation of the distal right femur. 3. No fracture or dislocation of the right tibia or fibula. 4. Osteopenia. Electronically Signed   By: Jearld Lesch M.D.   On: 10/06/2021 09:19    Microbiology: Recent Results (from the past 240 hour(s))  Resp Panel by RT-PCR (Flu A&B, Covid) Nasopharyngeal Swab     Status: None   Collection Time: 10/06/21  9:25 AM   Specimen: Nasopharyngeal Swab; Nasopharyngeal(NP) swabs in vial transport medium  Result Value Ref Range Status   SARS Coronavirus 2 by RT PCR NEGATIVE NEGATIVE Final    Comment: (NOTE) SARS-CoV-2 target nucleic acids are NOT DETECTED.  The SARS-CoV-2 RNA is generally detectable in upper respiratory specimens during the acute phase of infection. The lowest concentration of SARS-CoV-2 viral copies this assay can detect is 138 copies/mL. A negative result does not preclude SARS-Cov-2 infection and should not be used as the sole basis for treatment or other patient management decisions. A negative result may occur with  improper specimen collection/handling, submission of specimen other than nasopharyngeal swab, presence of viral mutation(s) within the areas targeted by this assay, and inadequate number of viral copies(<138 copies/mL).  A negative result must be combined with clinical observations, patient history, and epidemiological information. The expected result is Negative.  Fact Sheet for Patients:  BloggerCourse.com  Fact Sheet for Healthcare Providers:  SeriousBroker.it  This test is no t yet approved or cleared by the [place] FDA and  has been authorized for detection and/or diagnosis of SARS-CoV-2 by FDA under an Emergency Use Authorization (EUA). This EUA will remain  in effect (meaning this test can be used) for the duration of the COVID-19 declaration under Section 564(b)(1) of the Act, 21 U.S.C.section 360bbb-3(b)(1), unless the authorization is terminated  or  revoked sooner.       Influenza A by PCR NEGATIVE NEGATIVE Final   Influenza B by PCR NEGATIVE NEGATIVE Final    Comment: (NOTE) The Xpert Xpress SARS-CoV-2/FLU/RSV plus assay is intended as an aid in the diagnosis of influenza from Nasopharyngeal swab specimens and should not be used as a sole basis for treatment. Nasal washings and aspirates are unacceptable for Xpert Xpress SARS-CoV-2/FLU/RSV testing.  Fact Sheet for Patients: BloggerCourse.com  Fact Sheet for Healthcare Providers: SeriousBroker.it  This test is not yet approved or cleared by the [place] FDA and has been authorized for detection and/or diagnosis of SARS-CoV-2 by FDA under an Emergency Use Authorization (EUA). This EUA will remain in effect (meaning this test can be used) for the duration of the COVID-19 declaration under Section 564(b)(1) of the Act, 21 U.S.C. section 360bbb-3(b)(1), unless the authorization is terminated or revoked.  Performed at [facility], [address] [place], [place] [address]   Resp Panel by RT-PCR (Flu A&B, Covid) Nasopharyngeal Swab     Status: None   Collection Time: 10/11/21  5:09 PM   Specimen: Nasopharyngeal Swab; Nasopharyngeal(NP) swabs in vial transport medium  Result Value Ref Range Status   SARS Coronavirus 2 by RT PCR NEGATIVE NEGATIVE Final    Comment: (NOTE) SARS-CoV-2 target nucleic acids are NOT DETECTED.  The SARS-CoV-2 RNA is generally detectable in upper respiratory specimens during the acute phase of infection. The lowest concentration of SARS-CoV-2 viral copies this assay can detect is 138 copies/mL. A negative result does not preclude SARS-Cov-2 infection and should not be used as the sole basis for treatment or other patient management decisions. A negative result may occur with  improper specimen collection/handling, submission of specimen other than nasopharyngeal swab,  presence of viral mutation(s) within the areas targeted by this assay, and inadequate number of viral copies(<138 copies/mL). A negative result must be combined with clinical observations, patient history, and epidemiological information. The expected result is Negative.  Fact Sheet for Patients:  BloggerCourse.com  Fact Sheet for Healthcare Providers:  SeriousBroker.it  This test is no t yet approved or cleared by the [place] FDA and  has been authorized for detection and/or diagnosis of SARS-CoV-2 by FDA under an Emergency Use Authorization (EUA). This EUA will remain  in effect (meaning this test can be used) for the duration of the COVID-19 declaration under Section 564(b)(1) of the Act, 21 U.S.C.section 360bbb-3(b)(1), unless the authorization is terminated  or revoked sooner.       Influenza A by PCR NEGATIVE NEGATIVE Final   Influenza B by PCR NEGATIVE NEGATIVE Final    Comment: (NOTE) The Xpert Xpress SARS-CoV-2/FLU/RSV plus assay is intended as an aid in the diagnosis of influenza from Nasopharyngeal swab specimens and should not be used as a sole basis for treatment. Nasal washings and aspirates are unacceptable for Xpert Xpress  SARS-CoV-2/FLU/RSV testing.  Fact Sheet for Patients: BloggerCourse.com  Fact Sheet for Healthcare Providers: SeriousBroker.it  This test is not yet approved or cleared by the [place] FDA and has been authorized for detection and/or diagnosis of SARS-CoV-2 by FDA under an Emergency Use Authorization (EUA). This EUA will remain in effect (meaning this test can be used) for the duration of the COVID-19 declaration under Section 564(b)(1) of the Act, 21 U.S.C. section 360bbb-3(b)(1), unless the authorization is terminated or revoked.  Performed at [facility], [address], [place], [place] [address]      Labs: Basic  Metabolic Panel: Recent Labs  Lab 10/06/21 0925 10/07/21 0334 10/08/21 0220 10/10/21 0131 10/12/21 0252  NA 129* 130* 133* 129* 131*  K 4.9 4.5 4.4 4.4 4.6  CL 97* 98 99 95* 98  CO2 GLUCOSE 139* 172* 126* 110* 113*  BUN CREATININE 0.84 0.80 0.97 0.97 0.89  CALCIUM 9.4 8.4* 8.3* 8.5* 8.4*   Liver Function Tests: No results for input(s): AST, ALT, ALKPHOS, BILITOT, PROT, ALBUMIN in the last 168 hours. No results for input(s): LIPASE, AMYLASE in the last 168 hours. No results for input(s): AMMONIA in the last 168 hours. CBC: Recent Labs  Lab 10/06/21 0925 10/07/21 0334 10/12/21 0252  WBC 11.5* 14.1* 8.6  NEUTROABS 8.5*  --   --   HGB 12.5 10.2* 9.6*  HCT 37.2 30.2* 29.1*  MCV 91.6 90.7 91.8  PLT 324 278 361   Cardiac Enzymes: No results for input(s): CKTOTAL, CKMB, CKMBINDEX, TROPONINI in the last 168 hours. BNP: BNP (last 3 results) No results for input(s): BNP in the last 8760 hours.  ProBNP (last 3 results) No results for input(s): PROBNP in the last 8760 hours.  CBG: No results for input(s): GLUCAP in the last 168 hours.     Signed:  Meredeth Ide MD.  [facility] 10/12/2021, 9:21 AM

## 2021-10-13 ENCOUNTER — Encounter: Payer: Self-pay | Admitting: Adult Health

## 2021-10-13 ENCOUNTER — Non-Acute Institutional Stay (SKILLED_NURSING_FACILITY): Payer: Medicare Other | Admitting: Adult Health

## 2021-10-13 DIAGNOSIS — R42 Dizziness and giddiness: Secondary | ICD-10-CM

## 2021-10-13 DIAGNOSIS — I10 Essential (primary) hypertension: Secondary | ICD-10-CM

## 2021-10-13 DIAGNOSIS — R251 Tremor, unspecified: Secondary | ICD-10-CM | POA: Diagnosis not present

## 2021-10-13 DIAGNOSIS — E785 Hyperlipidemia, unspecified: Secondary | ICD-10-CM | POA: Diagnosis not present

## 2021-10-13 DIAGNOSIS — S72001S Fracture of unspecified part of neck of right femur, sequela: Secondary | ICD-10-CM | POA: Diagnosis not present

## 2021-10-13 DIAGNOSIS — D62 Acute posthemorrhagic anemia: Secondary | ICD-10-CM | POA: Diagnosis not present

## 2021-10-13 NOTE — Progress Notes (Signed)
Location:  [facility] Room Number: 304 A Place of Service:  SNF (31) Provider:  Kenard Gower, DNP, FNP-BC  Patient Care Team: Blair Heys, MD as PCP - General (Family Medicine)  Extended Emergency Contact Information Primary Emergency Contact: Opdahl,[name] Address: [address]          [place] Home Phone: [phone] Mobile Phone: [phone] Relation: Son Secondary Emergency Contact: Roulhac,james Mobile Phone: [phone] Relation: Son  Code Status:  DNR  Goals of care: Advanced Directive information Advanced Directives 10/06/2021  Does Patient Have a Medical Advance Directive? Yes  Type of Estate agent of [place];Living will  Does patient want to make changes to medical advance directive? No - Patient declined  Copy of Healthcare Power of Attorney in Chart? No - copy requested  Would patient like information on creating a medical advance directive? No - Patient declined     Chief Complaint  Patient presents with   Hospitalization Follow-up    Admitted to [facility] on 10/12/21    HPI:  Pt is a 87 y.o. female who was admitted to [facility] on 10/12/21 post hospital admission 10/06/21 to 10/12/21. She has a PMH of hypothyroidism, hypertension, hyperlipidemia, anxiety and ICH. She had a fall at home and sustained a right femoral neck fracture for which she had hemiarthroplasty on 10/06/21. She denied passing out. She had dizziness so propanolol was discontinued. She, also, had hyponatremia  with Na down to 129 so HCTZ was discontinued. Blood pressure medications were adjusted -  Lisinopril was discontinued. 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   She was seen in her room today. Right hip surgical hip incision is covered with aquacel  dressing, no redness noted. She rated her pain as 5/10.   Past Medical History:  Diagnosis Date   Hyperlipidemia    Hypothyroid    Stroke [facility])    Past Surgical History:  Procedure Laterality Date   ABDOMINAL HYSTERECTOMY     APPENDECTOMY     CHOLECYSTECTOMY     CRANIOTOMY N/A 04/30/2018   Procedure: CRANIOTOMY HEMATOMA EVACUATION SUBDURAL;  Surgeon: Julio Sicks, MD;  Location: [facility] OR;  Service: Neurosurgery;  Laterality: N/A;  suboccipital craniectomy for hematoma   HIP ARTHROPLASTY Right 10/06/2021   Procedure: ARTHROPLASTY BIPOLAR HIP (HEMIARTHROPLASTY);  Surgeon: Joen Laura, MD;  Location: [facility] OR;  Service: Orthopedics;  Laterality: Right;    No Known Allergies  Outpatient Encounter Medications as of 10/13/2021  Medication Sig   acetaminophen (TYLENOL) 500 MG tablet Take 2 tablets (1,000 mg total) by mouth every 8 (eight) hours as needed.   amLODipine (NORVASC) 5 MG tablet Take 1 tablet (5 mg total) by mouth daily.   aspirin 81 MG tablet Take 1 tablet (81 mg total) by mouth daily.   enoxaparin (LOVENOX) 40 MG/0.4ML injection Inject 0.4 mLs (40 mg total) into the skin daily for 25 days.   hydrALAZINE (APRESOLINE) 25 MG tablet Take 1 tablet (25 mg total) by mouth every 8 (eight) hours.   levothyroxine (SYNTHROID) 75 MCG tablet Take 1 tablet (75 mcg total) by mouth daily before breakfast.   metoprolol tartrate (LOPRESSOR) 25 MG tablet Take 1 tablet (25 mg total) by mouth 2 (two) times daily.   Multiple Vitamin (MULTIVITAMIN WITH MINERALS) TABS tablet Take 1 tablet by mouth daily.   rosuvastatin (  CRESTOR) 10 MG tablet Take 10 mg by mouth daily.   senna-docusate (SENOKOT-S) 8.6-50 MG tablet Take 1 tablet by mouth 2 (two) times daily as needed for mild constipation.   No facility-administered encounter medications on file as of 10/13/2021.    Review of Systems  GENERAL: No change in appetite, no fatigue, no weight changes, no fever or chills  MOUTH and THROAT: Denies oral  discomfort, gingival pain or bleeding RESPIRATORY: no cough, SOB, DOE, wheezing, hemoptysis CARDIAC: No chest pain, edema or palpitations GI: No abdominal pain, diarrhea, constipation, heart burn, nausea or vomiting GU: Denies dysuria, frequency, hematuria, incontinence, or discharge NEUROLOGICAL: Denies dizziness, syncope, numbness, or headache PSYCHIATRIC: Denies feelings of depression or anxiety. No report of hallucinations, insomnia, paranoia, or agitation   Immunization History  Administered Date(s) Administered   Tdap 06/24/2018   Pertinent  Health Maintenance Due  Topic Date Due   DEXA SCAN  Never done   INFLUENZA VACCINE  Never done   Fall Risk 10/10/2021 10/10/2021 10/11/2021 10/11/2021 10/12/2021  Falls in the past year? - - - - -  Was there an injury with Fall? - - - - -  Fall Risk Category Calculator - - - - -  Fall Risk Category - - - - -  Patient Fall Risk Level High fall risk High fall risk High fall risk High fall risk High fall risk     Vitals:   10/13/21 1250  BP: (!) 141/64  Pulse: 80  Resp: 20  Temp: (!) 97.5 F (36.4 C)  SpO2: 96%  Weight: 112 lb (50.8 kg)  Height: 5\' 6"  (1.676 m)   Body mass index is 18.08 kg/m.  Physical Exam  GENERAL APPEARANCE:  In no acute distress. Thin. SKIN:  Right hip surgical incision is dry, no erythema, covered with aquacel dressing MOUTH and THROAT: Lips are without lesions. Oral mucosa is moist and without lesions.  RESPIRATORY: Breathing is even & unlabored, BS CTAB CARDIAC: RRR, no murmur,no extra heart sounds, no edema GI: Abdomen soft, normal BS, no masses, no tenderness, no hepatomegaly NEUROLOGICAL: RUE tremor. Speech is slurred.  PSYCHIATRIC:  Affect and behavior are appropriate  Labs reviewed: Recent Labs    10/08/21 0220 10/10/21 0131 10/12/21 0252  NA 133* 129* 131*  K 4.4 4.4 4.6  CL 99 95* 98  CO2 28 26 25   GLUCOSE 126* 110* 113*  BUN 13 19 22   CREATININE 0.97 0.97 0.89  CALCIUM 8.3* 8.5*  8.4*   No results for input(s): AST, ALT, ALKPHOS, BILITOT, PROT, ALBUMIN in the last 8760 hours. Recent Labs    10/06/21 0925 10/07/21 0334 10/12/21 0252  WBC 11.5* 14.1* 8.6  NEUTROABS 8.5*  --   --   HGB 12.5 10.2* 9.6*  HCT 37.2 30.2* 29.1*  MCV 91.6 90.7 91.8  PLT 324 278 361   Lab Results  Component Value Date   TSH 0.489 10/07/2021   Lab Results  Component Value Date   HGBA1C 5.8 (H) 05/02/2018   Lab Results  Component Value Date   CHOL 183 05/02/2018   HDL 48 05/02/2018   LDLCALC 98 05/02/2018   TRIG 186 (H) 05/02/2018   CHOLHDL 3.8 05/02/2018    Significant Diagnostic Results in last 30 days:  DG Chest 2 View  Result Date: 10/06/2021 CLINICAL DATA:  Fall, right hip pain, initial encounter. EXAM: CHEST - 2 VIEW COMPARISON:  04/30/2018. FINDINGS: Patient is rotated. Trachea is midline. Heart size stable. Thoracic aorta is calcified. Biapical pleuroparenchymal  scarring. Lungs are otherwise clear. No pleural fluid. Degenerative changes in the spine. IMPRESSION: 1. No acute findings. 2.  Aortic atherosclerosis (ICD10-I70.0). Electronically Signed   By: Lorin Picket M.D.   On: 10/06/2021 09:27   DG Tibia/Fibula Right  Result Date: 10/06/2021 CLINICAL DATA:  Fall, pain EXAM: DG HIP (WITH OR WITHOUT PELVIS) 2-3V RIGHT; RIGHT FEMUR 2 VIEWS; RIGHT TIBIA AND FIBULA - 2 VIEW COMPARISON:  None. FINDINGS: Osteopenia. There is an impacted transcervical fracture of the right femoral neck. No fracture or dislocation of the bony pelvis or partially imaged left hip. No fracture or dislocation of the distal right femur. Vascular calcinosis. No fracture or dislocation of the right tibia or fibula. IMPRESSION: 1. Impacted transcervical fracture of the right femoral neck. 2. No fracture or dislocation of the distal right femur. 3. No fracture or dislocation of the right tibia or fibula. 4. Osteopenia. Electronically Signed   By: Delanna Ahmadi M.D.   On: 10/06/2021 09:19   CT HEAD WO  CONTRAST (5MM)  Result Date: 10/06/2021 CLINICAL DATA:  87 year old female with history of trauma from a fall with injury to the head. EXAM: CT HEAD WITHOUT CONTRAST CT CERVICAL SPINE WITHOUT CONTRAST TECHNIQUE: Multidetector CT imaging of the head and cervical spine was performed following the standard protocol without intravenous contrast. Multiplanar CT image reconstructions of the cervical spine were also generated. COMPARISON:  Head CT 12/16/2017. FINDINGS: CT HEAD FINDINGS Brain: Mild cerebral atrophy. Patchy and confluent areas of decreased attenuation are noted throughout the deep and periventricular white matter of the cerebral hemispheres bilaterally, compatible with chronic microvascular ischemic disease. Volume loss and focal areas of low attenuation within the right cerebellar hemisphere where there is also a dystrophic calcification, presumably areas of encephalomalacia/gliosis from prior surgery. No evidence of acute infarction, hemorrhage, hydrocephalus, extra-axial collection or mass lesion/mass effect. Vascular: No hyperdense vessel or unexpected calcification. Skull: Status post suboccipital craniectomy. Negative for fracture or focal lesion. Sinuses/Orbits: No acute finding. Other: None. CT CERVICAL SPINE FINDINGS Alignment: Normal. Skull base and vertebrae: No acute fracture. No primary bone lesion or focal pathologic process. Soft tissues and spinal canal: No prevertebral fluid or swelling. No visible canal hematoma. Disc levels: Mild multilevel degenerative disc disease, most evident at C5-C6. Mild multilevel facet arthropathy. Upper chest: Negative. Other: None. IMPRESSION: 1. No evidence of significant acute traumatic injury to the skull, brain or cervical spine. 2. Mild cerebral atrophy with chronic microvascular ischemic changes in the cerebral white matter and postoperative changes in the right cerebellar hemisphere related to prior suboccipital craniectomy and evacuation of right  cerebellar hemorrhage. 3. Mild multilevel degenerative disc disease and cervical spondylosis, as above. Electronically Signed   By: Vinnie Langton M.D.   On: 10/06/2021 09:31   CT Cervical Spine Wo Contrast  Result Date: 10/06/2021 CLINICAL DATA:  87 year old female with history of trauma from a fall with injury to the head. EXAM: CT HEAD WITHOUT CONTRAST CT CERVICAL SPINE WITHOUT CONTRAST TECHNIQUE: Multidetector CT imaging of the head and cervical spine was performed following the standard protocol without intravenous contrast. Multiplanar CT image reconstructions of the cervical spine were also generated. COMPARISON:  Head CT 12/16/2017. FINDINGS: CT HEAD FINDINGS Brain: Mild cerebral atrophy. Patchy and confluent areas of decreased attenuation are noted throughout the deep and periventricular white matter of the cerebral hemispheres bilaterally, compatible with chronic microvascular ischemic disease. Volume loss and focal areas of low attenuation within the right cerebellar hemisphere where there is also a dystrophic calcification, presumably  areas of encephalomalacia/gliosis from prior surgery. No evidence of acute infarction, hemorrhage, hydrocephalus, extra-axial collection or mass lesion/mass effect. Vascular: No hyperdense vessel or unexpected calcification. Skull: Status post suboccipital craniectomy. Negative for fracture or focal lesion. Sinuses/Orbits: No acute finding. Other: None. CT CERVICAL SPINE FINDINGS Alignment: Normal. Skull base and vertebrae: No acute fracture. No primary bone lesion or focal pathologic process. Soft tissues and spinal canal: No prevertebral fluid or swelling. No visible canal hematoma. Disc levels: Mild multilevel degenerative disc disease, most evident at C5-C6. Mild multilevel facet arthropathy. Upper chest: Negative. Other: None. IMPRESSION: 1. No evidence of significant acute traumatic injury to the skull, brain or cervical spine. 2. Mild cerebral atrophy with  chronic microvascular ischemic changes in the cerebral white matter and postoperative changes in the right cerebellar hemisphere related to prior suboccipital craniectomy and evacuation of right cerebellar hemorrhage. 3. Mild multilevel degenerative disc disease and cervical spondylosis, as above. Electronically Signed   By: Vinnie Langton M.D.   On: 10/06/2021 09:31   DG Pelvis Portable  Result Date: 10/06/2021 CLINICAL DATA:  Postoperative hip EXAM: PORTABLE PELVIS 1-2 VIEWS COMPARISON:  X-ray right femur 10/06/2021 FINDINGS: Partially visualized total right hip arthroplasty. There is no evidence of pelvic fracture or diastasis. No hip fracture or dislocation of left. No pelvic bone lesions are seen. Subcutaneus soft tissue edema and emphysema. IMPRESSION: Partially visualized total right hip arthroplasty with overlying surgical changes of the soft tissues. Electronically Signed   By: Iven Finn M.D.   On: 10/06/2021 19:45   DG HIP PORT UNILAT WITH PELVIS 1V RIGHT  Result Date: 10/07/2021 CLINICAL DATA:  Right hip replacement EXAM: DG HIP (WITH OR WITHOUT PELVIS) 1V PORT RIGHT COMPARISON:  10/06/2020 FINDINGS: Right hip hemiarthroplasty. Prosthesis in satisfactory position. No complication or acute fracture. IMPRESSION: Satisfactory right hip hemiarthroplasty Electronically Signed   By: Franchot Gallo M.D.   On: 10/07/2021 16:36   DG Hip Unilat W or Wo Pelvis 2-3 Views Right  Result Date: 10/06/2021 CLINICAL DATA:  Fall, pain EXAM: DG HIP (WITH OR WITHOUT PELVIS) 2-3V RIGHT; RIGHT FEMUR 2 VIEWS; RIGHT TIBIA AND FIBULA - 2 VIEW COMPARISON:  None. FINDINGS: Osteopenia. There is an impacted transcervical fracture of the right femoral neck. No fracture or dislocation of the bony pelvis or partially imaged left hip. No fracture or dislocation of the distal right femur. Vascular calcinosis. No fracture or dislocation of the right tibia or fibula. IMPRESSION: 1. Impacted transcervical fracture of the  right femoral neck. 2. No fracture or dislocation of the distal right femur. 3. No fracture or dislocation of the right tibia or fibula. 4. Osteopenia. Electronically Signed   By: Delanna Ahmadi M.D.   On: 10/06/2021 09:19   DG Femur Min 2 Views Right  Result Date: 10/06/2021 CLINICAL DATA:  Fall, pain EXAM: DG HIP (WITH OR WITHOUT PELVIS) 2-3V RIGHT; RIGHT FEMUR 2 VIEWS; RIGHT TIBIA AND FIBULA - 2 VIEW COMPARISON:  None. FINDINGS: Osteopenia. There is an impacted transcervical fracture of the right femoral neck. No fracture or dislocation of the bony pelvis or partially imaged left hip. No fracture or dislocation of the distal right femur. Vascular calcinosis. No fracture or dislocation of the right tibia or fibula. IMPRESSION: 1. Impacted transcervical fracture of the right femoral neck. 2. No fracture or dislocation of the distal right femur. 3. No fracture or dislocation of the right tibia or fibula. 4. Osteopenia. Electronically Signed   By: Delanna Ahmadi M.D.   On: 10/06/2021 09:19  Assessment/Plan  1. Closed fracture of right hip, sequela -  S/P hemiarthroplasty on 10/06/21 -   WBAT -    Continue Lovenox 40 mg SQ daily for 4 weeks for DVT prophylaxis -    Follow-up with orthopedics -    for PT and OT, for therapeutic strengthening exercises  2. Dizziness -   Propanolol was discontinued and metoprolol 25 mg twice a day was restarted   3. Tremor -    RUE tremors started post craniotomy for intracranial hemorrhage in 2019 -    Follow-up with neurosurgery  4. Essential hypertension -   Stable, continue metoprolol 25 mg twice a day and amlodipine dosage decreased to 5 mg daily -   HCTZ and lisinopril were discontinued  5. Hyperlipidemia, unspecified hyperlipidemia type Lab Results  Component Value Date   CHOL 183 05/02/2018   HDL 48 05/02/2018   LDLCALC 98 05/02/2018   TRIG 186 (H) 05/02/2018   CHOLHDL 3.8 05/02/2018   -   Continue Crestor  6. Acute blood loss anemia Lab  Results  Component Value Date   HGB 9.6 (L) 10/12/2021   -   Will monitor     Family/ staff Communication: Discussed plan of care with resident and charge nurse.  Labs/tests ordered:  CBC and BMP  Goals of care:   Short-term care   Durenda Age, DNP, MSN, FNP-BC [facility] [phone] (Monday-Friday 8:00 a.m. - 5:00 p.m.) [phone] (after hours)

## 2021-10-14 ENCOUNTER — Non-Acute Institutional Stay (SKILLED_NURSING_FACILITY): Payer: Medicare Other | Admitting: Internal Medicine

## 2021-10-14 ENCOUNTER — Encounter: Payer: Self-pay | Admitting: Internal Medicine

## 2021-10-14 DIAGNOSIS — R42 Dizziness and giddiness: Secondary | ICD-10-CM | POA: Diagnosis not present

## 2021-10-14 DIAGNOSIS — E871 Hypo-osmolality and hyponatremia: Secondary | ICD-10-CM

## 2021-10-14 DIAGNOSIS — D62 Acute posthemorrhagic anemia: Secondary | ICD-10-CM

## 2021-10-14 DIAGNOSIS — S72001S Fracture of unspecified part of neck of right femur, sequela: Secondary | ICD-10-CM | POA: Diagnosis not present

## 2021-10-14 DIAGNOSIS — G252 Other specified forms of tremor: Secondary | ICD-10-CM

## 2021-10-14 NOTE — Assessment & Plan Note (Signed)
PT/OT at SNF as tolerated. ?

## 2021-10-14 NOTE — Assessment & Plan Note (Signed)
No recurrence of the postural hypotension symptoms.  Off propranolol she does exhibit an intermittent coarse intention tremor.

## 2021-10-14 NOTE — Progress Notes (Signed)
NURSING HOME LOCATION:  [facility] ROOM NUMBER:  304 A  CODE STATUS:  DNR  PCP:  Gaynelle Arabian MD  This is a comprehensive admission note to this SNFperformed on this date less than 30 days from date of admission. Included are preadmission medical/surgical history; reconciled medication list; family history; social history and comprehensive review of systems.  Corrections and additions to the records were documented. Comprehensive physical exam was also performed. Additionally a clinical summary was entered for each active diagnosis pertinent to this admission in the Problem List to enhance continuity of care.  HPI: She was hospitalized 11/9 - 10/12/2021 presenting with right hip pain after fall.  There was no definite neuro or cardiac prodrome and she stated that after arising from bed and beginning to ambulate to the living room she lost her balance and fell on her right side.  Imaging documented femoral neck fracture. Hemiarthroplasty was completed 11/9 by Dr Charlies Constable; postop Lovenox 40 mg subcu daily for 4 weeks was recommended as DVT prophylaxis in context of PMH of cns bleed. Acute blood loss anemia was present; admission H/H 12.5/37.2.  There was slight progression and H/H at discharge were 9.6/29.1. Patient experienced some postural dizziness/presyncope while hospitalized.  After getting up from the commode she noted the dizziness.  Symptoms resolved after propranolol was discontinued.  Metoprolol 25 mg twice daily was reinitiated.  HCTZ 12.5 mg and lisinopril 20 mg twice daily were also discontinued. Hyponatremia was documented during hospitalization with sodium range of 129-133 in the context of the HCTZ therapy. TSH was 0.489;L-thyroxine dose was decreased to 75 mcg daily. She was deemed stable enough to be discharged to SNF for PT/OT rehab with WBAT.  Past medical and surgical history: Includes hypothyroidism, essential hypertension,  dyslipidemia, and history of stroke.  She also has a history of intention tremor of the right upper extremity which began following the craniotomy for cerebellar bleed in 2019. Surgeries and procedures include cholecystectomy, hysterectomy, and craniotomy for hematoma evacuation.  Social history: Nondrinker; "social smoker".  Family history: Noncontributory due to advanced age.   Review of systems: She confirms that there was no neurologic or cardiac prodrome prior to the fall at home.  She states that she simply turned too quickly and lost her balance resulting in the fall.  She states that she typically is very active walking up to 2-3 miles per day.  She also does arm and leg exercises at least 5 times a month with [employer] at the [facility]. She validates some residual pain at the hip.  Constitutional: No fever, significant weight change  Eyes: No redness, discharge, pain, vision change ENT/mouth: No nasal congestion, purulent discharge, earache, change in hearing, sore throat  Cardiovascular: No chest pain, palpitations, paroxysmal nocturnal dyspnea, claudication, edema  Respiratory: No cough, sputum production, hemoptysis, DOE, significant snoring, apnea Gastrointestinal: No heartburn, dysphagia, abdominal pain, nausea /vomiting, rectal bleeding, melena, change in bowels Genitourinary: No dysuria, hematuria, pyuria, incontinence, nocturia Dermatologic: No rash, pruritus, change in appearance of skin Neurologic: No present dizziness, headache, syncope, seizures, numbness, tingling Psychiatric: No significant anxiety, depression, insomnia, anorexia Endocrine: No change in hair/skin/nails, excessive thirst, excessive hunger, excessive urination  Hematologic/lymphatic: No significant bruising, lymphadenopathy, abnormal bleeding Allergy/immunology: No itchy/watery eyes, significant sneezing, urticaria, angioedema  Physical exam:  Pertinent or positive findings: She is thin and appears  somewhat suboptimally nourished.  She exhibits a slight hyponasal, slightly dysarthric speech pattern.  Eyebrows are decreased.  The right pupil is  larger than the left.  The left nasolabial fold is decreased and the corner of the mouth sags on the left.  A slight tremor of the lip is noted intermittently.  She has an upper plate.  There appears to be some respiratory variation to the heart rhythm.  Breath sounds are decreased.  Dorsalis pedis pulses are stronger than posterior tibial pulses.  Limb atrophy is present along with marked interosseous wasting.  She has intermittent side to side rolling type tremor of the right lower extremity and intermittent gross spastic tremor of the right upper extremity.  She has mixed PIP/DIP arthritic changes.  There is marked hyperpigmentation of the shins with some resolving ecchymosis as well as a reverse "C" pattern of vitiligo over the right shin and small patches of vitiligo at the left distal shin.  She also has some ecchymosis of the left forearm.  General appearance: in no distress; normal respiratory effort Lymphatic: No lymphadenopathy about the head, neck, axilla. Eyes: No conjunctival inflammation or lid edema is present. There is no scleral icterus. Ears:  External ear exam shows no significant lesions or deformities.   Nose:  External nasal examination shows no deformity or inflammation. Nasal mucosa are pink and moist without lesions, exudates Oral exam: Lips and gums are healthy appearing.There is no oropharyngeal erythema or exudate. Neck:  No thyromegaly, masses, tenderness noted.    Heart:  No gallop, murmur, click, rub.  Lungs: without wheezes, rhonchi, rales, rubs. Abdomen: Bowel sounds are normal.  Abdomen is soft and nontender with no organomegaly, hernias, masses. GU: Deferred  Extremities:  No cyanosis, clubbing, edema. Neurologic exam:  Balance, Rhomberg, finger to nose testing could not be completed due to clinical state Skin: Warm & dry  w/o tenting. No significant rash.  See clinical summary under each active problem in the Problem List with associated updated therapeutic plan

## 2021-10-14 NOTE — Assessment & Plan Note (Signed)
Monitor sodium off HCTZ. 

## 2021-10-14 NOTE — Patient Instructions (Signed)
See assessment and plan under each diagnosis in the problem list and acutely for this visit 

## 2021-10-14 NOTE — Assessment & Plan Note (Signed)
No bleeding dyscrasias noted on Lovenox prophylaxis postop.

## 2021-10-14 NOTE — Assessment & Plan Note (Signed)
Evaluate transitioning back to propanolol if intention tremor progressive and intolerable

## 2021-10-20 ENCOUNTER — Non-Acute Institutional Stay (SKILLED_NURSING_FACILITY): Payer: Medicare Other | Admitting: Adult Health

## 2021-10-20 ENCOUNTER — Encounter: Payer: Self-pay | Admitting: Adult Health

## 2021-10-20 DIAGNOSIS — S72001S Fracture of unspecified part of neck of right femur, sequela: Secondary | ICD-10-CM

## 2021-10-20 DIAGNOSIS — E039 Hypothyroidism, unspecified: Secondary | ICD-10-CM

## 2021-10-20 DIAGNOSIS — I10 Essential (primary) hypertension: Secondary | ICD-10-CM | POA: Diagnosis not present

## 2021-10-20 NOTE — Progress Notes (Signed)
Location:  [facility] Room Number: 304 Place of Service:  SNF (31) Provider:  Kenard GowerMedina-Vargas, Lamond Glantz, DNP, FNP-BC  Patient Care Team: Blair HeysEhinger, Robert, MD as PCP - General (Family Medicine)  Extended Emergency Contact Information Primary Emergency Contact: Rumple,[name] Address[address]          [place] Home Phone: [phone] Mobile Phone: [phone] Relation: Son Secondary Emergency Contact: Yorke,james Mobile Phone: [phone] Relation: Son  Code Status:  DNR  Goals of care: Advanced Directive information Advanced Directives 10/27/2021  Does Patient Have a Medical Advance Directive? Yes  Type of Advance Directive Out of facility DNR (pink MOST or yellow form)  Does patient want to make changes to medical advance directive? No - Patient declined  Copy of Healthcare Power of Attorney in Chart? -  Would patient like information on creating a medical advance directive? -     Chief Complaint  Patient presents with   Acute Visit    Short term rehabilitation follow up    HPI:  Pt is a 87 y.o. female seen today for an acute visit. She is currently having PT, OT and ST. SBPs ranging from  122 to 148. She takes amlodipine 5 mg daily, metoprolol tartrate 25 mg twice a day and hydralazine 25 mg every 8 hours for hypertension.  She takes Synthroid 75 mcg 1 tab daily for hypothyroidism.  Latest TSH 0.489, 10/07/21.      Past Medical History:  Diagnosis Date   Hyperlipidemia    Hypothyroid    Stroke [facility](HCC)    Past Surgical History:  Procedure Laterality Date   ABDOMINAL HYSTERECTOMY     APPENDECTOMY     CHOLECYSTECTOMY     CRANIOTOMY N/A 04/30/2018   Procedure: CRANIOTOMY HEMATOMA EVACUATION SUBDURAL;  Surgeon: Julio SicksPool, Henry, MD;  Location: [facility] OR;  Service: Neurosurgery;  Laterality: N/A;  suboccipital craniectomy for hematoma   HIP ARTHROPLASTY Right 10/06/2021   Procedure: ARTHROPLASTY BIPOLAR HIP (HEMIARTHROPLASTY);  Surgeon:  Joen LauraMarchwiany, Daniel A, MD;  [facility];  Service: Orthopedics;  Laterality: Right;    Allergies  Allergen Reactions   Hydrochlorothiazide     Hyponatremia    Outpatient Encounter Medications as of 10/20/2021  Medication Sig   acetaminophen (TYLENOL) 500 MG tablet Take 2 tablets (1,000 mg total) by mouth every 8 (eight) hours as needed.   aspirin 81 MG tablet Take 1 tablet (81 mg total) by mouth daily.   bisacodyl (DULCOLAX) 10 MG suppository Place 10 mg rectally as needed for moderate constipation.   enoxaparin (LOVENOX) 40 MG/0.4ML injection Inject 0.4 mLs (40 mg total) into the skin daily for 25 days.   Magnesium Hydroxide (MILK OF MAGNESIA PO) Take 30 mLs by mouth as needed.   Multiple Vitamin (MULTIVITAMIN WITH MINERALS) TABS tablet Take 1 tablet by mouth daily.   senna-docusate (SENOKOT-S) 8.6-50 MG tablet Take 1 tablet by mouth 2 (two) times daily as needed for mild constipation.   [DISCONTINUED] amLODipine (NORVASC) 5 MG tablet Take 1 tablet (5 mg total) by mouth daily.   [DISCONTINUED] hydrALAZINE (APRESOLINE) 25 MG tablet Take 1 tablet (25 mg total) by mouth every 8 (eight) hours.   [DISCONTINUED] levothyroxine (SYNTHROID) 75 MCG tablet Take 1 tablet (75 mcg total) by mouth daily before breakfast.   [DISCONTINUED] metoprolol tartrate (LOPRESSOR) 25 MG tablet Take 1 tablet (25 mg total) by mouth 2 (two) times daily.   [DISCONTINUED] rosuvastatin (CRESTOR) 10 MG tablet Take 10 mg by mouth daily.   No  facility-administered encounter medications on file as of 10/20/2021.    Review of Systems  GENERAL: No change in appetite, no fatigue, no weight changes, no fever, chills or weakness MOUTH and THROAT: Denies oral discomfort, gingival pain or bleeding RESPIRATORY: no cough, SOB, DOE, wheezing, hemoptysis CARDIAC: No chest pain, edema or palpitations GI: No abdominal pain, diarrhea, constipation, heart burn, nausea or vomiting GU: Denies dysuria, frequency, hematuria,  incontinence, or discharge NEUROLOGICAL: Denies dizziness, syncope, numbness, or headache PSYCHIATRIC: Denies feelings of depression or anxiety. No report of hallucinations, insomnia, paranoia, or agitation    Immunization History  Administered Date(s) Administered   Influenza, High Dose Seasonal PF 08/18/2017   PPD Test 07/17/2018   Tdap 06/24/2018   Pertinent  Health Maintenance Due  Topic Date Due   DEXA SCAN  Never done   INFLUENZA VACCINE  06/28/2021   Fall Risk 10/10/2021 10/10/2021 10/11/2021 10/11/2021 10/12/2021  Falls in the past year? - - - - -  Was there an injury with Fall? - - - - -  Fall Risk Category Calculator - - - - -  Fall Risk Category - - - - -  Patient Fall Risk Level High fall risk High fall risk High fall risk High fall risk High fall risk     Vitals:   10/20/21 0959  BP: (!) 145/52  Pulse: 77  Temp: 97.7 F (36.5 C)  Weight: 109 lb 6.4 oz (49.6 kg)  Height: 5\' 6"  (1.676 m)   Body mass index is 17.66 kg/m.  Physical Exam  GENERAL APPEARANCE:  In no acute distress.  SKIN:  Skin is warm and dry.  MOUTH and THROAT: Lips are without lesions. Oral mucosa is moist and without lesions.  RESPIRATORY: Breathing is even & unlabored, BS CTAB CARDIAC: RRR, no murmur,no extra heart sounds, no edema GI: Abdomen soft, normal BS, no masses, no tenderness EXTREMITIES:  Able to move X 4 extremities NEUROLOGICAL: + tremor. Speech is clear. Alert and oriented X 3. PSYCHIATRIC:  Affect and behavior are appropriate  Labs reviewed: Recent Labs    10/08/21 0220 10/10/21 0131 10/12/21 0252  NA 133* 129* 131*  K 4.4 4.4 4.6  CL 99 95* 98  CO2 28 26 25   GLUCOSE 126* 110* 113*  BUN 13 19 22   CREATININE 0.97 0.97 0.89  CALCIUM 8.3* 8.5* 8.4*   No results for input(s): AST, ALT, ALKPHOS, BILITOT, PROT, ALBUMIN in the last 8760 hours. Recent Labs    10/06/21 0925 10/07/21 0334 10/12/21 0252  WBC 11.5* 14.1* 8.6  NEUTROABS 8.5*  --   --   HGB 12.5  10.2* 9.6*  HCT 37.2 30.2* 29.1*  MCV 91.6 90.7 91.8  PLT 324 278 361   Lab Results  Component Value Date   TSH 0.489 10/07/2021   Lab Results  Component Value Date   HGBA1C 5.8 (H) 05/02/2018   Lab Results  Component Value Date   CHOL 183 05/02/2018   HDL 48 05/02/2018   LDLCALC 98 05/02/2018   TRIG 186 (H) 05/02/2018   CHOLHDL 3.8 05/02/2018    Significant Diagnostic Results in last 30 days:  DG Chest 2 View  Result Date: 10/06/2021 CLINICAL DATA:  Fall, right hip pain, initial encounter. EXAM: CHEST - 2 VIEW COMPARISON:  04/30/2018. FINDINGS: Patient is rotated. Trachea is midline. Heart size stable. Thoracic aorta is calcified. Biapical pleuroparenchymal scarring. Lungs are otherwise clear. No pleural fluid. Degenerative changes in the spine. IMPRESSION: 1. No acute findings. 2.  Aortic atherosclerosis (  ICD10-I70.0). Electronically Signed   By: Leanna Battles M.D.   On: 10/06/2021 09:27   DG Tibia/Fibula Right  Result Date: 10/06/2021 CLINICAL DATA:  Fall, pain EXAM: DG HIP (WITH OR WITHOUT PELVIS) 2-3V RIGHT; RIGHT FEMUR 2 VIEWS; RIGHT TIBIA AND FIBULA - 2 VIEW COMPARISON:  None. FINDINGS: Osteopenia. There is an impacted transcervical fracture of the right femoral neck. No fracture or dislocation of the bony pelvis or partially imaged left hip. No fracture or dislocation of the distal right femur. Vascular calcinosis. No fracture or dislocation of the right tibia or fibula. IMPRESSION: 1. Impacted transcervical fracture of the right femoral neck. 2. No fracture or dislocation of the distal right femur. 3. No fracture or dislocation of the right tibia or fibula. 4. Osteopenia. Electronically Signed   By: Jearld Lesch M.D.   On: 10/06/2021 09:19   CT HEAD WO CONTRAST ( )  Result Date: 10/06/2021 CLINICAL DATA:  87 year old female with history of trauma from a fall with injury to the head. EXAM: CT HEAD WITHOUT CONTRAST CT CERVICAL SPINE WITHOUT CONTRAST TECHNIQUE:  Multidetector CT imaging of the head and cervical spine was performed following the standard protocol without intravenous contrast. Multiplanar CT image reconstructions of the cervical spine were also generated. COMPARISON:  Head CT 12/16/2017. FINDINGS: CT HEAD FINDINGS Brain: Mild cerebral atrophy. Patchy and confluent areas of decreased attenuation are noted throughout the deep and periventricular white matter of the cerebral hemispheres bilaterally, compatible with chronic microvascular ischemic disease. Volume loss and focal areas of low attenuation within the right cerebellar hemisphere where there is also a dystrophic calcification, presumably areas of encephalomalacia/gliosis from prior surgery. No evidence of acute infarction, hemorrhage, hydrocephalus, extra-axial collection or mass lesion/mass effect. Vascular: No hyperdense vessel or unexpected calcification. Skull: Status post suboccipital craniectomy. Negative for fracture or focal lesion. Sinuses/Orbits: No acute finding. Other: None. CT CERVICAL SPINE FINDINGS Alignment: Normal. Skull base and vertebrae: No acute fracture. No primary bone lesion or focal pathologic process. Soft tissues and spinal canal: No prevertebral fluid or swelling. No visible canal hematoma. Disc levels: Mild multilevel degenerative disc disease, most evident at C5-C6. Mild multilevel facet arthropathy. Upper chest: Negative. Other: None. IMPRESSION: 1. No evidence of significant acute traumatic injury to the skull, brain or cervical spine. 2. Mild cerebral atrophy with chronic microvascular ischemic changes in the cerebral white matter and postoperative changes in the right cerebellar hemisphere related to prior suboccipital craniectomy and evacuation of right cerebellar hemorrhage. 3. Mild multilevel degenerative disc disease and cervical spondylosis, as above. Electronically Signed   By: Trudie Reed M.D.   On: 10/06/2021 09:31   CT Cervical Spine Wo  Contrast  Result Date: 10/06/2021 CLINICAL DATA:  87 year old female with history of trauma from a fall with injury to the head. EXAM: CT HEAD WITHOUT CONTRAST CT CERVICAL SPINE WITHOUT CONTRAST TECHNIQUE: Multidetector CT imaging of the head and cervical spine was performed following the standard protocol without intravenous contrast. Multiplanar CT image reconstructions of the cervical spine were also generated. COMPARISON:  Head CT 12/16/2017. FINDINGS: CT HEAD FINDINGS Brain: Mild cerebral atrophy. Patchy and confluent areas of decreased attenuation are noted throughout the deep and periventricular white matter of the cerebral hemispheres bilaterally, compatible with chronic microvascular ischemic disease. Volume loss and focal areas of low attenuation within the right cerebellar hemisphere where there is also a dystrophic calcification, presumably areas of encephalomalacia/gliosis from prior surgery. No evidence of acute infarction, hemorrhage, hydrocephalus, extra-axial collection or mass lesion/mass effect. Vascular: No hyperdense  vessel or unexpected calcification. Skull: Status post suboccipital craniectomy. Negative for fracture or focal lesion. Sinuses/Orbits: No acute finding. Other: None. CT CERVICAL SPINE FINDINGS Alignment: Normal. Skull base and vertebrae: No acute fracture. No primary bone lesion or focal pathologic process. Soft tissues and spinal canal: No prevertebral fluid or swelling. No visible canal hematoma. Disc levels: Mild multilevel degenerative disc disease, most evident at C5-C6. Mild multilevel facet arthropathy. Upper chest: Negative. Other: None. IMPRESSION: 1. No evidence of significant acute traumatic injury to the skull, brain or cervical spine. 2. Mild cerebral atrophy with chronic microvascular ischemic changes in the cerebral white matter and postoperative changes in the right cerebellar hemisphere related to prior suboccipital craniectomy and evacuation of right cerebellar  hemorrhage. 3. Mild multilevel degenerative disc disease and cervical spondylosis, as above. Electronically Signed   By: Vinnie Langton M.D.   On: 10/06/2021 09:31   DG Pelvis Portable  Result Date: 10/06/2021 CLINICAL DATA:  Postoperative hip EXAM: PORTABLE PELVIS 1-2 VIEWS COMPARISON:  X-ray right femur 10/06/2021 FINDINGS: Partially visualized total right hip arthroplasty. There is no evidence of pelvic fracture or diastasis. No hip fracture or dislocation of left. No pelvic bone lesions are seen. Subcutaneus soft tissue edema and emphysema. IMPRESSION: Partially visualized total right hip arthroplasty with overlying surgical changes of the soft tissues. Electronically Signed   By: Iven Finn M.D.   On: 10/06/2021 19:45   DG HIP PORT UNILAT WITH PELVIS 1V RIGHT  Result Date: 10/07/2021 CLINICAL DATA:  Right hip replacement EXAM: DG HIP (WITH OR WITHOUT PELVIS) 1V PORT RIGHT COMPARISON:  10/06/2020 FINDINGS: Right hip hemiarthroplasty. Prosthesis in satisfactory position. No complication or acute fracture. IMPRESSION: Satisfactory right hip hemiarthroplasty Electronically Signed   By: Franchot Gallo M.D.   On: 10/07/2021 16:36   DG Hip Unilat W or Wo Pelvis 2-3 Views Right  Result Date: 10/06/2021 CLINICAL DATA:  Fall, pain EXAM: DG HIP (WITH OR WITHOUT PELVIS) 2-3V RIGHT; RIGHT FEMUR 2 VIEWS; RIGHT TIBIA AND FIBULA - 2 VIEW COMPARISON:  None. FINDINGS: Osteopenia. There is an impacted transcervical fracture of the right femoral neck. No fracture or dislocation of the bony pelvis or partially imaged left hip. No fracture or dislocation of the distal right femur. Vascular calcinosis. No fracture or dislocation of the right tibia or fibula. IMPRESSION: 1. Impacted transcervical fracture of the right femoral neck. 2. No fracture or dislocation of the distal right femur. 3. No fracture or dislocation of the right tibia or fibula. 4. Osteopenia. Electronically Signed   By: Delanna Ahmadi M.D.   On:  10/06/2021 09:19   DG Femur Min 2 Views Right  Result Date: 10/06/2021 CLINICAL DATA:  Fall, pain EXAM: DG HIP (WITH OR WITHOUT PELVIS) 2-3V RIGHT; RIGHT FEMUR 2 VIEWS; RIGHT TIBIA AND FIBULA - 2 VIEW COMPARISON:  None. FINDINGS: Osteopenia. There is an impacted transcervical fracture of the right femoral neck. No fracture or dislocation of the bony pelvis or partially imaged left hip. No fracture or dislocation of the distal right femur. Vascular calcinosis. No fracture or dislocation of the right tibia or fibula. IMPRESSION: 1. Impacted transcervical fracture of the right femoral neck. 2. No fracture or dislocation of the distal right femur. 3. No fracture or dislocation of the right tibia or fibula. 4. Osteopenia. Electronically Signed   By: Delanna Ahmadi M.D.   On: 10/06/2021 09:19    Assessment/Plan  1. Essential hypertension   BPs stable, continue amlodipine, hydralazine and metoprolol tartrate  2. Closed fracture  of right hip, sequela -  S/P hemiarthroplasty on 10/06/2021 -    Continue Lovenox 40 mg daily for DVT prophylaxis -    WBAT -   Follow-up with orthopedics  3. Acquired hypothyroidism Lab Results  Component Value Date   TSH 0.489 10/07/2021   -    Continue Synthroid 75 mcg 1 tab daily     Family/ staff Communication:   Discussed plan of care with resident and charge nurse.  Labs/tests ordered:   None  Goals of care:   Short-term care   Durenda Age, DNP, MSN, FNP-BC [facility] [phone] (Monday-Friday 8:00 a.m. - 5:00 p.m.) [phone] (after hours)

## 2021-10-26 DIAGNOSIS — S72001D Fracture of unspecified part of neck of right femur, subsequent encounter for closed fracture with routine healing: Secondary | ICD-10-CM | POA: Diagnosis not present

## 2021-10-27 ENCOUNTER — Encounter: Payer: Self-pay | Admitting: Adult Health

## 2021-10-27 ENCOUNTER — Non-Acute Institutional Stay (SKILLED_NURSING_FACILITY): Payer: Medicare Other | Admitting: Adult Health

## 2021-10-27 DIAGNOSIS — E785 Hyperlipidemia, unspecified: Secondary | ICD-10-CM

## 2021-10-27 DIAGNOSIS — E039 Hypothyroidism, unspecified: Secondary | ICD-10-CM

## 2021-10-27 DIAGNOSIS — R251 Tremor, unspecified: Secondary | ICD-10-CM | POA: Diagnosis not present

## 2021-10-27 DIAGNOSIS — S72001S Fracture of unspecified part of neck of right femur, sequela: Secondary | ICD-10-CM

## 2021-10-27 DIAGNOSIS — I10 Essential (primary) hypertension: Secondary | ICD-10-CM | POA: Diagnosis not present

## 2021-10-27 NOTE — Progress Notes (Addendum)
Location:  [facility] Room Number: 304-A Place of Service:  SNF (31) Provider:  Kenard Gower, DNP, FNP-BC  Patient Care Team: Blair Heys, MD as PCP - General (Family Medicine)  Extended Emergency Contact Information Primary Emergency Contact: Tamburo,[name] Address: [address]          [place] Home Phone: [phone] Mobile Phone: [phone] Relation: Son Secondary Emergency Contact: Mcdade,james Mobile Phone: [phone] Relation: Son  Code Status: DNR   Goals of care: Advanced Directive information Advanced Directives 10/27/2021  Does Patient Have a Medical Advance Directive? Yes  Type of Advance Directive Out of facility DNR (pink MOST or yellow form)  Does patient want to make changes to medical advance directive? No - Patient declined  Copy of Healthcare Power of Attorney in Chart? -  Would patient like information on creating a medical advance directive? -     Chief Complaint  Patient presents with   Discharge Note    For discharge on 10/29/21 to [facility]    HPI:  Pt is a 87 y.o. female who is for discharge to [facility] on 10/29/21.  She was admitted to [facility] on 10/12/2021 post hospital admission 10/06/2021 to 10/12/2021.  She has a PMH of hypothyroidism, hypertension, hyperlipidemia, anxiety and ICH.  She had a fall at home and sustained a right femoral neck fracture for which she had hemiarthroplasty on 10/06/2021.  She denied passing out.  She had dizziness so propanolol was discontinued.  She, also, had hyponatremia with sodium down to 129 so HCTZ was discontinued.  Blood pressure medications were adjusted -lisinopril was discontinued.  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  She will discharge to [facility], to be closer to family and for continued  rehabilitation.   Past Medical History:  Diagnosis Date   Hyperlipidemia    Hypothyroid    Stroke [facility])    Past Surgical History:  Procedure Laterality Date   ABDOMINAL HYSTERECTOMY     APPENDECTOMY     CHOLECYSTECTOMY     CRANIOTOMY N/A 04/30/2018   Procedure: CRANIOTOMY HEMATOMA EVACUATION SUBDURAL;  Surgeon: Julio Sicks, MD;  Location: [facility] OR;  Service: Neurosurgery;  Laterality: N/A;  suboccipital craniectomy for hematoma   HIP ARTHROPLASTY Right 10/06/2021   Procedure: ARTHROPLASTY BIPOLAR HIP (HEMIARTHROPLASTY);  Surgeon: Joen Laura, MD;  Location: [facility] OR;  Service: Orthopedics;  Laterality: Right;    Allergies  Allergen Reactions   Hydrochlorothiazide     Hyponatremia    Outpatient Encounter Medications as of 10/27/2021  Medication Sig   acetaminophen (TYLENOL) 500 MG tablet Take 2 tablets (1,000 mg total) by mouth every 8 (eight) hours as needed.   aspirin 81 MG tablet Take 1 tablet (81 mg total) by mouth daily.   bisacodyl (DULCOLAX) 10 MG suppository Place 10 mg rectally as needed for moderate constipation.   enoxaparin (LOVENOX) 40 MG/0.4ML injection Inject 0.4 mLs (40 mg total) into the skin daily for 25 days.   Magnesium Hydroxide (MILK OF MAGNESIA PO) Take 30 mLs by mouth as needed.   Multiple Vitamin (MULTIVITAMIN WITH MINERALS) TABS tablet Take 1 tablet by mouth daily.   senna-docusate (SENOKOT-S) 8.6-50 MG tablet Take 1 tablet by mouth 2 (two) times daily as needed for mild constipation.   [DISCONTINUED] amLODipine (NORVASC) 5 MG tablet Take 1 tablet (5 mg total) by mouth  daily.   [DISCONTINUED] hydrALAZINE (APRESOLINE) 25 MG tablet Take 1 tablet (25 mg total) by mouth every 8 (eight) hours.   [DISCONTINUED] levothyroxine (SYNTHROID) 75 MCG tablet Take 1 tablet (75 mcg total) by mouth daily before breakfast.   [DISCONTINUED] metoprolol tartrate (LOPRESSOR) 25 MG tablet Take 1 tablet (25 mg total) by mouth 2 (two) times daily.   [DISCONTINUED] rosuvastatin  (CRESTOR) 10 MG tablet Take 10 mg by mouth daily.   amLODipine (NORVASC) 5 MG tablet Take 1 tablet (5 mg total) by mouth daily.   hydrALAZINE (APRESOLINE) 25 MG tablet Take 1 tablet (25 mg total) by mouth every 8 (eight) hours.   levothyroxine (SYNTHROID) 75 MCG tablet Take 1 tablet (75 mcg total) by mouth daily before breakfast.   metoprolol tartrate (LOPRESSOR) 25 MG tablet Take 1 tablet (25 mg total) by mouth 2 (two) times daily.   rosuvastatin (CRESTOR) 10 MG tablet Take 1 tablet (10 mg total) by mouth daily.   No facility-administered encounter medications on file as of 10/27/2021.    Review of Systems  GENERAL: No change in appetite, no fatigue, no weight changes, no fever, chills or weakness MOUTH and THROAT: Denies oral discomfort, gingival pain or bleeding RESPIRATORY: no cough, SOB, DOE, wheezing, hemoptysis CARDIAC: No chest pain, edema or palpitations GI: No abdominal pain, diarrhea, constipation, heart burn, nausea or vomiting GU: Denies dysuria, frequency, hematuria, incontinence, or discharge NEUROLOGICAL: Denies dizziness, syncope, numbness, or headache PSYCHIATRIC: Denies feelings of depression or anxiety. No report of hallucinations, insomnia, paranoia, or agitation   Immunization History  Administered Date(s) Administered   Influenza, High Dose Seasonal PF 08/18/2017   PPD Test 07/17/2018   Tdap 06/24/2018   Pertinent  Health Maintenance Due  Topic Date Due   DEXA SCAN  Never done   INFLUENZA VACCINE  06/28/2021   Fall Risk 10/10/2021 10/10/2021 10/11/2021 10/11/2021 10/12/2021  Falls in the past year? - - - - -  Was there an injury with Fall? - - - - -  Fall Risk Category Calculator - - - - -  Fall Risk Category - - - - -  Patient Fall Risk Level High fall risk High fall risk High fall risk High fall risk High fall risk     Vitals:   10/27/21 1319  BP: 132/61  Pulse: 89  Resp: 19  Temp: 97.7 F (36.5 C)  Weight: 111 lb 9.6 oz (50.6 kg)  Height: 5'  6" (1.676 m)   Body mass index is 18.01 kg/m.  Physical Exam  GENERAL APPEARANCE:  In no acute distress.  SKIN:  Right femoral surgical wound is healed, dry and no redness MOUTH and THROAT: Lips are without lesions. Oral mucosa is moist and without lesions.  RESPIRATORY: Breathing is even & unlabored, BS CTAB CARDIAC: RRR, no murmur,no extra heart sounds, no edema GI: Abdomen soft, normal BS, no masses, no tenderness EXTREMITIES: Able to move X 4 extremities NEUROLOGICAL: There is no tremor. Speech is clear. Alert and oriented X 3. PSYCHIATRIC:  Affect and behavior are appropriate  Labs reviewed: Recent Labs    10/08/21 0220 10/10/21 0131 10/12/21 0252  NA 133* 129* 131*  K 4.4 4.4 4.6  CL 99 95* 98  CO2 28 26 25   GLUCOSE 126* 110* 113*  BUN 13 19 22   CREATININE 0.97 0.97 0.89  CALCIUM 8.3* 8.5* 8.4*   No results for input(s): AST, ALT, ALKPHOS, BILITOT, PROT, ALBUMIN in the last 8760 hours. Recent Labs    10/06/21 [phone]  10/07/21 0334 10/12/21 0252  WBC 11.5* 14.1* 8.6  NEUTROABS 8.5*  --   --   HGB 12.5 10.2* 9.6*  HCT 37.2 30.2* 29.1*  MCV 91.6 90.7 91.8  PLT 324 278 361   Lab Results  Component Value Date   TSH 0.489 10/07/2021   Lab Results  Component Value Date   HGBA1C 5.8 (H) 05/02/2018   Lab Results  Component Value Date   CHOL 183 05/02/2018   HDL 48 05/02/2018   LDLCALC 98 05/02/2018   TRIG 186 (H) 05/02/2018   CHOLHDL 3.8 05/02/2018    Significant Diagnostic Results in last 30 days:  DG Chest 2 View  Result Date: 10/06/2021 CLINICAL DATA:  Fall, right hip pain, initial encounter. EXAM: CHEST - 2 VIEW COMPARISON:  04/30/2018. FINDINGS: Patient is rotated. Trachea is midline. Heart size stable. Thoracic aorta is calcified. Biapical pleuroparenchymal scarring. Lungs are otherwise clear. No pleural fluid. Degenerative changes in the spine. IMPRESSION: 1. No acute findings. 2.  Aortic atherosclerosis (ICD10-I70.0). Electronically Signed   By:  Lorin Picket M.D.   On: 10/06/2021 09:27   DG Tibia/Fibula Right  Result Date: 10/06/2021 CLINICAL DATA:  Fall, pain EXAM: DG HIP (WITH OR WITHOUT PELVIS) 2-3V RIGHT; RIGHT FEMUR 2 VIEWS; RIGHT TIBIA AND FIBULA - 2 VIEW COMPARISON:  None. FINDINGS: Osteopenia. There is an impacted transcervical fracture of the right femoral neck. No fracture or dislocation of the bony pelvis or partially imaged left hip. No fracture or dislocation of the distal right femur. Vascular calcinosis. No fracture or dislocation of the right tibia or fibula. IMPRESSION: 1. Impacted transcervical fracture of the right femoral neck. 2. No fracture or dislocation of the distal right femur. 3. No fracture or dislocation of the right tibia or fibula. 4. Osteopenia. Electronically Signed   By: Delanna Ahmadi M.D.   On: 10/06/2021 09:19   CT HEAD WO CONTRAST (5MM)  Result Date: 10/06/2021 CLINICAL DATA:  87 year old female with history of trauma from a fall with injury to the head. EXAM: CT HEAD WITHOUT CONTRAST CT CERVICAL SPINE WITHOUT CONTRAST TECHNIQUE: Multidetector CT imaging of the head and cervical spine was performed following the standard protocol without intravenous contrast. Multiplanar CT image reconstructions of the cervical spine were also generated. COMPARISON:  Head CT 12/16/2017. FINDINGS: CT HEAD FINDINGS Brain: Mild cerebral atrophy. Patchy and confluent areas of decreased attenuation are noted throughout the deep and periventricular white matter of the cerebral hemispheres bilaterally, compatible with chronic microvascular ischemic disease. Volume loss and focal areas of low attenuation within the right cerebellar hemisphere where there is also a dystrophic calcification, presumably areas of encephalomalacia/gliosis from prior surgery. No evidence of acute infarction, hemorrhage, hydrocephalus, extra-axial collection or mass lesion/mass effect. Vascular: No hyperdense vessel or unexpected calcification. Skull: Status  post suboccipital craniectomy. Negative for fracture or focal lesion. Sinuses/Orbits: No acute finding. Other: None. CT CERVICAL SPINE FINDINGS Alignment: Normal. Skull base and vertebrae: No acute fracture. No primary bone lesion or focal pathologic process. Soft tissues and spinal canal: No prevertebral fluid or swelling. No visible canal hematoma. Disc levels: Mild multilevel degenerative disc disease, most evident at C5-C6. Mild multilevel facet arthropathy. Upper chest: Negative. Other: None. IMPRESSION: 1. No evidence of significant acute traumatic injury to the skull, brain or cervical spine. 2. Mild cerebral atrophy with chronic microvascular ischemic changes in the cerebral white matter and postoperative changes in the right cerebellar hemisphere related to prior suboccipital craniectomy and evacuation of right cerebellar hemorrhage. 3. Mild multilevel  degenerative disc disease and cervical spondylosis, as above. Electronically Signed   By: Vinnie Langton M.D.   On: 10/06/2021 09:31   CT Cervical Spine Wo Contrast  Result Date: 10/06/2021 CLINICAL DATA:  87 year old female with history of trauma from a fall with injury to the head. EXAM: CT HEAD WITHOUT CONTRAST CT CERVICAL SPINE WITHOUT CONTRAST TECHNIQUE: Multidetector CT imaging of the head and cervical spine was performed following the standard protocol without intravenous contrast. Multiplanar CT image reconstructions of the cervical spine were also generated. COMPARISON:  Head CT 12/16/2017. FINDINGS: CT HEAD FINDINGS Brain: Mild cerebral atrophy. Patchy and confluent areas of decreased attenuation are noted throughout the deep and periventricular white matter of the cerebral hemispheres bilaterally, compatible with chronic microvascular ischemic disease. Volume loss and focal areas of low attenuation within the right cerebellar hemisphere where there is also a dystrophic calcification, presumably areas of encephalomalacia/gliosis from prior  surgery. No evidence of acute infarction, hemorrhage, hydrocephalus, extra-axial collection or mass lesion/mass effect. Vascular: No hyperdense vessel or unexpected calcification. Skull: Status post suboccipital craniectomy. Negative for fracture or focal lesion. Sinuses/Orbits: No acute finding. Other: None. CT CERVICAL SPINE FINDINGS Alignment: Normal. Skull base and vertebrae: No acute fracture. No primary bone lesion or focal pathologic process. Soft tissues and spinal canal: No prevertebral fluid or swelling. No visible canal hematoma. Disc levels: Mild multilevel degenerative disc disease, most evident at C5-C6. Mild multilevel facet arthropathy. Upper chest: Negative. Other: None. IMPRESSION: 1. No evidence of significant acute traumatic injury to the skull, brain or cervical spine. 2. Mild cerebral atrophy with chronic microvascular ischemic changes in the cerebral white matter and postoperative changes in the right cerebellar hemisphere related to prior suboccipital craniectomy and evacuation of right cerebellar hemorrhage. 3. Mild multilevel degenerative disc disease and cervical spondylosis, as above. Electronically Signed   By: Vinnie Langton M.D.   On: 10/06/2021 09:31   DG Pelvis Portable  Result Date: 10/06/2021 CLINICAL DATA:  Postoperative hip EXAM: PORTABLE PELVIS 1-2 VIEWS COMPARISON:  X-ray right femur 10/06/2021 FINDINGS: Partially visualized total right hip arthroplasty. There is no evidence of pelvic fracture or diastasis. No hip fracture or dislocation of left. No pelvic bone lesions are seen. Subcutaneus soft tissue edema and emphysema. IMPRESSION: Partially visualized total right hip arthroplasty with overlying surgical changes of the soft tissues. Electronically Signed   By: Iven Finn M.D.   On: 10/06/2021 19:45   DG HIP PORT UNILAT WITH PELVIS 1V RIGHT  Result Date: 10/07/2021 CLINICAL DATA:  Right hip replacement EXAM: DG HIP (WITH OR WITHOUT PELVIS) 1V PORT RIGHT  COMPARISON:  10/06/2020 FINDINGS: Right hip hemiarthroplasty. Prosthesis in satisfactory position. No complication or acute fracture. IMPRESSION: Satisfactory right hip hemiarthroplasty Electronically Signed   By: Franchot Gallo M.D.   On: 10/07/2021 16:36   DG Hip Unilat W or Wo Pelvis 2-3 Views Right  Result Date: 10/06/2021 CLINICAL DATA:  Fall, pain EXAM: DG HIP (WITH OR WITHOUT PELVIS) 2-3V RIGHT; RIGHT FEMUR 2 VIEWS; RIGHT TIBIA AND FIBULA - 2 VIEW COMPARISON:  None. FINDINGS: Osteopenia. There is an impacted transcervical fracture of the right femoral neck. No fracture or dislocation of the bony pelvis or partially imaged left hip. No fracture or dislocation of the distal right femur. Vascular calcinosis. No fracture or dislocation of the right tibia or fibula. IMPRESSION: 1. Impacted transcervical fracture of the right femoral neck. 2. No fracture or dislocation of the distal right femur. 3. No fracture or dislocation of the right tibia or  fibula. 4. Osteopenia. Electronically Signed   By: Delanna Ahmadi M.D.   On: 10/06/2021 09:19   DG Femur Min 2 Views Right  Result Date: 10/06/2021 CLINICAL DATA:  Fall, pain EXAM: DG HIP (WITH OR WITHOUT PELVIS) 2-3V RIGHT; RIGHT FEMUR 2 VIEWS; RIGHT TIBIA AND FIBULA - 2 VIEW COMPARISON:  None. FINDINGS: Osteopenia. There is an impacted transcervical fracture of the right femoral neck. No fracture or dislocation of the bony pelvis or partially imaged left hip. No fracture or dislocation of the distal right femur. Vascular calcinosis. No fracture or dislocation of the right tibia or fibula. IMPRESSION: 1. Impacted transcervical fracture of the right femoral neck. 2. No fracture or dislocation of the distal right femur. 3. No fracture or dislocation of the right tibia or fibula. 4. Osteopenia. Electronically Signed   By: Delanna Ahmadi M.D.   On: 10/06/2021 09:19    Assessment/Plan  1. Closed fracture of right hip, sequela -  S/P hemiarthroplasty on 10/06/2021 -     WBAT -   Continue Lovenox 40 mg SQ daily with end date 11/06/21 for DVT prophylaxis -    Follow-up with orthopedics  2. Essential hypertension - hydrALAZINE (APRESOLINE) 25 MG tablet; Take 1 tablet (25 mg total) by mouth every 8 (eight) hours.  Dispense: 90 tablet; Refill: 0 - metoprolol tartrate (LOPRESSOR) 25 MG tablet; Take 1 tablet (25 mg total) by mouth 2 (two) times daily.  Dispense: 60 tablet; Refill: 0 - amLODipine (NORVASC) 5 MG tablet; Take 1 tablet (5 mg total) by mouth daily.  Dispense: 30 tablet; Refill: 0  3. Acquired hypothyroidism - levothyroxine (SYNTHROID) 75 MCG tablet; Take 1 tablet (75 mcg total) by mouth daily before breakfast.  Dispense: 30 tablet; Refill: 0  4. Hyperlipidemia, unspecified hyperlipidemia type - rosuvastatin (CRESTOR) 10 MG tablet; Take 1 tablet (10 mg total) by mouth daily.  Dispense: 30 tablet; Refill: 0  5.  Tremor -    follow-up with neurosurgery   I have filled out patient's discharge paperwork and e-prescribed medications.    DME provided:  None  Total discharge time: Less than 30 minutes  Discharge time involved coordination of the discharge process with social worker, nursing staff and therapy department.     Durenda Age, DNP, MSN, FNP-BC [facility] [phone] (Monday-Friday 8:00 a.m. - 5:00 p.m.) [phone] (after hours)

## 2021-10-29 MED ORDER — AMLODIPINE BESYLATE 5 MG PO TABS: 5.0000 mg | ORAL_TABLET | Freq: Every day | ORAL | 0 refills | Status: AC

## 2021-10-29 MED ORDER — METOPROLOL TARTRATE 25 MG PO TABS: 25.0000 mg | ORAL_TABLET | Freq: Two times a day (BID) | ORAL | 0 refills | Status: AC

## 2021-10-29 MED ORDER — LEVOTHYROXINE SODIUM 75 MCG PO TABS: 75.0000 ug | ORAL_TABLET | Freq: Every day | ORAL | 0 refills | Status: DC

## 2021-10-29 MED ORDER — ROSUVASTATIN CALCIUM 10 MG PO TABS: 10.0000 mg | ORAL_TABLET | Freq: Every day | ORAL | 0 refills | Status: DC

## 2021-10-29 MED ORDER — HYDRALAZINE HCL 25 MG PO TABS: 25.0000 mg | ORAL_TABLET | Freq: Three times a day (TID) | ORAL | 0 refills | Status: AC

## 2021-10-30 DIAGNOSIS — Z79899 Other long term (current) drug therapy: Secondary | ICD-10-CM | POA: Diagnosis not present

## 2021-10-31 DIAGNOSIS — I10 Essential (primary) hypertension: Secondary | ICD-10-CM | POA: Diagnosis not present

## 2021-10-31 DIAGNOSIS — S72001D Fracture of unspecified part of neck of right femur, subsequent encounter for closed fracture with routine healing: Secondary | ICD-10-CM | POA: Diagnosis not present

## 2021-10-31 DIAGNOSIS — E039 Hypothyroidism, unspecified: Secondary | ICD-10-CM | POA: Diagnosis not present

## 2021-11-01 DIAGNOSIS — R279 Unspecified lack of coordination: Secondary | ICD-10-CM | POA: Diagnosis not present

## 2021-11-01 DIAGNOSIS — M6281 Muscle weakness (generalized): Secondary | ICD-10-CM | POA: Diagnosis not present

## 2021-11-01 DIAGNOSIS — I639 Cerebral infarction, unspecified: Secondary | ICD-10-CM | POA: Diagnosis not present

## 2021-11-01 DIAGNOSIS — S72001D Fracture of unspecified part of neck of right femur, subsequent encounter for closed fracture with routine healing: Secondary | ICD-10-CM | POA: Diagnosis not present

## 2021-11-01 DIAGNOSIS — I614 Nontraumatic intracerebral hemorrhage in cerebellum: Secondary | ICD-10-CM | POA: Diagnosis not present

## 2021-11-01 DIAGNOSIS — R2681 Unsteadiness on feet: Secondary | ICD-10-CM | POA: Diagnosis not present

## 2021-11-01 DIAGNOSIS — Z9181 History of falling: Secondary | ICD-10-CM | POA: Diagnosis not present

## 2021-11-02 DIAGNOSIS — M6281 Muscle weakness (generalized): Secondary | ICD-10-CM | POA: Diagnosis not present

## 2021-11-02 DIAGNOSIS — Z9181 History of falling: Secondary | ICD-10-CM | POA: Diagnosis not present

## 2021-11-02 DIAGNOSIS — I614 Nontraumatic intracerebral hemorrhage in cerebellum: Secondary | ICD-10-CM | POA: Diagnosis not present

## 2021-11-02 DIAGNOSIS — R2681 Unsteadiness on feet: Secondary | ICD-10-CM | POA: Diagnosis not present

## 2021-11-02 DIAGNOSIS — I639 Cerebral infarction, unspecified: Secondary | ICD-10-CM | POA: Diagnosis not present

## 2021-11-02 DIAGNOSIS — S72001D Fracture of unspecified part of neck of right femur, subsequent encounter for closed fracture with routine healing: Secondary | ICD-10-CM | POA: Diagnosis not present

## 2021-11-02 DIAGNOSIS — R279 Unspecified lack of coordination: Secondary | ICD-10-CM | POA: Diagnosis not present

## 2021-11-03 DIAGNOSIS — R279 Unspecified lack of coordination: Secondary | ICD-10-CM | POA: Diagnosis not present

## 2021-11-03 DIAGNOSIS — I639 Cerebral infarction, unspecified: Secondary | ICD-10-CM | POA: Diagnosis not present

## 2021-11-03 DIAGNOSIS — R2681 Unsteadiness on feet: Secondary | ICD-10-CM | POA: Diagnosis not present

## 2021-11-03 DIAGNOSIS — Z9181 History of falling: Secondary | ICD-10-CM | POA: Diagnosis not present

## 2021-11-03 DIAGNOSIS — I614 Nontraumatic intracerebral hemorrhage in cerebellum: Secondary | ICD-10-CM | POA: Diagnosis not present

## 2021-11-03 DIAGNOSIS — S72001D Fracture of unspecified part of neck of right femur, subsequent encounter for closed fracture with routine healing: Secondary | ICD-10-CM | POA: Diagnosis not present

## 2021-11-03 DIAGNOSIS — M6281 Muscle weakness (generalized): Secondary | ICD-10-CM | POA: Diagnosis not present

## 2021-11-04 DIAGNOSIS — E039 Hypothyroidism, unspecified: Secondary | ICD-10-CM | POA: Diagnosis not present

## 2021-11-04 DIAGNOSIS — R2681 Unsteadiness on feet: Secondary | ICD-10-CM | POA: Diagnosis not present

## 2021-11-04 DIAGNOSIS — I614 Nontraumatic intracerebral hemorrhage in cerebellum: Secondary | ICD-10-CM | POA: Diagnosis not present

## 2021-11-04 DIAGNOSIS — S72001D Fracture of unspecified part of neck of right femur, subsequent encounter for closed fracture with routine healing: Secondary | ICD-10-CM | POA: Diagnosis not present

## 2021-11-04 DIAGNOSIS — E7800 Pure hypercholesterolemia, unspecified: Secondary | ICD-10-CM | POA: Diagnosis not present

## 2021-11-04 DIAGNOSIS — R269 Unspecified abnormalities of gait and mobility: Secondary | ICD-10-CM | POA: Diagnosis not present

## 2021-11-04 DIAGNOSIS — I10 Essential (primary) hypertension: Secondary | ICD-10-CM | POA: Diagnosis not present

## 2021-11-04 DIAGNOSIS — R279 Unspecified lack of coordination: Secondary | ICD-10-CM | POA: Diagnosis not present

## 2021-11-04 DIAGNOSIS — I639 Cerebral infarction, unspecified: Secondary | ICD-10-CM | POA: Diagnosis not present

## 2021-11-04 DIAGNOSIS — S72001A Fracture of unspecified part of neck of right femur, initial encounter for closed fracture: Secondary | ICD-10-CM | POA: Diagnosis not present

## 2021-11-04 DIAGNOSIS — Z9181 History of falling: Secondary | ICD-10-CM | POA: Diagnosis not present

## 2021-11-04 DIAGNOSIS — M6281 Muscle weakness (generalized): Secondary | ICD-10-CM | POA: Diagnosis not present

## 2021-11-05 DIAGNOSIS — R2681 Unsteadiness on feet: Secondary | ICD-10-CM | POA: Diagnosis not present

## 2021-11-05 DIAGNOSIS — Z9181 History of falling: Secondary | ICD-10-CM | POA: Diagnosis not present

## 2021-11-05 DIAGNOSIS — M6281 Muscle weakness (generalized): Secondary | ICD-10-CM | POA: Diagnosis not present

## 2021-11-05 DIAGNOSIS — I614 Nontraumatic intracerebral hemorrhage in cerebellum: Secondary | ICD-10-CM | POA: Diagnosis not present

## 2021-11-05 DIAGNOSIS — R279 Unspecified lack of coordination: Secondary | ICD-10-CM | POA: Diagnosis not present

## 2021-11-05 DIAGNOSIS — S72001D Fracture of unspecified part of neck of right femur, subsequent encounter for closed fracture with routine healing: Secondary | ICD-10-CM | POA: Diagnosis not present

## 2021-11-05 DIAGNOSIS — I639 Cerebral infarction, unspecified: Secondary | ICD-10-CM | POA: Diagnosis not present

## 2021-11-10 DIAGNOSIS — M47812 Spondylosis without myelopathy or radiculopathy, cervical region: Secondary | ICD-10-CM | POA: Diagnosis not present

## 2021-11-10 DIAGNOSIS — Z9181 History of falling: Secondary | ICD-10-CM | POA: Diagnosis not present

## 2021-11-10 DIAGNOSIS — M80851D Other osteoporosis with current pathological fracture, right femur, subsequent encounter for fracture with routine healing: Secondary | ICD-10-CM | POA: Diagnosis not present

## 2021-11-10 DIAGNOSIS — Z96641 Presence of right artificial hip joint: Secondary | ICD-10-CM | POA: Diagnosis not present

## 2021-11-10 DIAGNOSIS — Z7982 Long term (current) use of aspirin: Secondary | ICD-10-CM | POA: Diagnosis not present

## 2021-11-10 DIAGNOSIS — R7303 Prediabetes: Secondary | ICD-10-CM | POA: Diagnosis not present

## 2021-11-10 DIAGNOSIS — I700 Atherosclerosis of aorta: Secondary | ICD-10-CM | POA: Diagnosis not present

## 2021-11-10 DIAGNOSIS — I129 Hypertensive chronic kidney disease with stage 1 through stage 4 chronic kidney disease, or unspecified chronic kidney disease: Secondary | ICD-10-CM | POA: Diagnosis not present

## 2021-11-10 DIAGNOSIS — I69393 Ataxia following cerebral infarction: Secondary | ICD-10-CM | POA: Diagnosis not present

## 2021-11-10 DIAGNOSIS — N183 Chronic kidney disease, stage 3 unspecified: Secondary | ICD-10-CM | POA: Diagnosis not present

## 2021-11-10 DIAGNOSIS — J439 Emphysema, unspecified: Secondary | ICD-10-CM | POA: Diagnosis not present

## 2021-11-10 DIAGNOSIS — E039 Hypothyroidism, unspecified: Secondary | ICD-10-CM | POA: Diagnosis not present

## 2021-11-10 DIAGNOSIS — E7800 Pure hypercholesterolemia, unspecified: Secondary | ICD-10-CM | POA: Diagnosis not present

## 2021-11-10 DIAGNOSIS — M8580 Other specified disorders of bone density and structure, unspecified site: Secondary | ICD-10-CM | POA: Diagnosis not present

## 2021-11-10 DIAGNOSIS — Z87891 Personal history of nicotine dependence: Secondary | ICD-10-CM | POA: Diagnosis not present

## 2021-11-11 DIAGNOSIS — M80851D Other osteoporosis with current pathological fracture, right femur, subsequent encounter for fracture with routine healing: Secondary | ICD-10-CM | POA: Diagnosis not present

## 2021-11-11 DIAGNOSIS — M8580 Other specified disorders of bone density and structure, unspecified site: Secondary | ICD-10-CM | POA: Diagnosis not present

## 2021-11-11 DIAGNOSIS — E7800 Pure hypercholesterolemia, unspecified: Secondary | ICD-10-CM | POA: Diagnosis not present

## 2021-11-11 DIAGNOSIS — I69393 Ataxia following cerebral infarction: Secondary | ICD-10-CM | POA: Diagnosis not present

## 2021-11-11 DIAGNOSIS — M47812 Spondylosis without myelopathy or radiculopathy, cervical region: Secondary | ICD-10-CM | POA: Diagnosis not present

## 2021-11-11 DIAGNOSIS — R7303 Prediabetes: Secondary | ICD-10-CM | POA: Diagnosis not present

## 2021-11-11 DIAGNOSIS — Z7982 Long term (current) use of aspirin: Secondary | ICD-10-CM | POA: Diagnosis not present

## 2021-11-11 DIAGNOSIS — E039 Hypothyroidism, unspecified: Secondary | ICD-10-CM | POA: Diagnosis not present

## 2021-11-11 DIAGNOSIS — Z96641 Presence of right artificial hip joint: Secondary | ICD-10-CM | POA: Diagnosis not present

## 2021-11-11 DIAGNOSIS — I700 Atherosclerosis of aorta: Secondary | ICD-10-CM | POA: Diagnosis not present

## 2021-11-11 DIAGNOSIS — Z9181 History of falling: Secondary | ICD-10-CM | POA: Diagnosis not present

## 2021-11-11 DIAGNOSIS — Z87891 Personal history of nicotine dependence: Secondary | ICD-10-CM | POA: Diagnosis not present

## 2021-11-11 DIAGNOSIS — J439 Emphysema, unspecified: Secondary | ICD-10-CM | POA: Diagnosis not present

## 2021-11-11 DIAGNOSIS — I129 Hypertensive chronic kidney disease with stage 1 through stage 4 chronic kidney disease, or unspecified chronic kidney disease: Secondary | ICD-10-CM | POA: Diagnosis not present

## 2021-11-11 DIAGNOSIS — N183 Chronic kidney disease, stage 3 unspecified: Secondary | ICD-10-CM | POA: Diagnosis not present

## 2021-11-12 DIAGNOSIS — E7800 Pure hypercholesterolemia, unspecified: Secondary | ICD-10-CM | POA: Diagnosis not present

## 2021-11-12 DIAGNOSIS — I69393 Ataxia following cerebral infarction: Secondary | ICD-10-CM | POA: Diagnosis not present

## 2021-11-12 DIAGNOSIS — I129 Hypertensive chronic kidney disease with stage 1 through stage 4 chronic kidney disease, or unspecified chronic kidney disease: Secondary | ICD-10-CM | POA: Diagnosis not present

## 2021-11-12 DIAGNOSIS — E039 Hypothyroidism, unspecified: Secondary | ICD-10-CM | POA: Diagnosis not present

## 2021-11-12 DIAGNOSIS — J439 Emphysema, unspecified: Secondary | ICD-10-CM | POA: Diagnosis not present

## 2021-11-12 DIAGNOSIS — Z7982 Long term (current) use of aspirin: Secondary | ICD-10-CM | POA: Diagnosis not present

## 2021-11-12 DIAGNOSIS — I700 Atherosclerosis of aorta: Secondary | ICD-10-CM | POA: Diagnosis not present

## 2021-11-12 DIAGNOSIS — M8580 Other specified disorders of bone density and structure, unspecified site: Secondary | ICD-10-CM | POA: Diagnosis not present

## 2021-11-12 DIAGNOSIS — R7303 Prediabetes: Secondary | ICD-10-CM | POA: Diagnosis not present

## 2021-11-12 DIAGNOSIS — Z9181 History of falling: Secondary | ICD-10-CM | POA: Diagnosis not present

## 2021-11-12 DIAGNOSIS — Z96641 Presence of right artificial hip joint: Secondary | ICD-10-CM | POA: Diagnosis not present

## 2021-11-12 DIAGNOSIS — Z87891 Personal history of nicotine dependence: Secondary | ICD-10-CM | POA: Diagnosis not present

## 2021-11-12 DIAGNOSIS — M47812 Spondylosis without myelopathy or radiculopathy, cervical region: Secondary | ICD-10-CM | POA: Diagnosis not present

## 2021-11-12 DIAGNOSIS — M80851D Other osteoporosis with current pathological fracture, right femur, subsequent encounter for fracture with routine healing: Secondary | ICD-10-CM | POA: Diagnosis not present

## 2021-11-12 DIAGNOSIS — N183 Chronic kidney disease, stage 3 unspecified: Secondary | ICD-10-CM | POA: Diagnosis not present

## 2021-11-15 DIAGNOSIS — Z96641 Presence of right artificial hip joint: Secondary | ICD-10-CM | POA: Diagnosis not present

## 2021-11-15 DIAGNOSIS — J439 Emphysema, unspecified: Secondary | ICD-10-CM | POA: Diagnosis not present

## 2021-11-15 DIAGNOSIS — Z9181 History of falling: Secondary | ICD-10-CM | POA: Diagnosis not present

## 2021-11-15 DIAGNOSIS — E039 Hypothyroidism, unspecified: Secondary | ICD-10-CM | POA: Diagnosis not present

## 2021-11-15 DIAGNOSIS — M47812 Spondylosis without myelopathy or radiculopathy, cervical region: Secondary | ICD-10-CM | POA: Diagnosis not present

## 2021-11-15 DIAGNOSIS — E7800 Pure hypercholesterolemia, unspecified: Secondary | ICD-10-CM | POA: Diagnosis not present

## 2021-11-15 DIAGNOSIS — I700 Atherosclerosis of aorta: Secondary | ICD-10-CM | POA: Diagnosis not present

## 2021-11-15 DIAGNOSIS — N183 Chronic kidney disease, stage 3 unspecified: Secondary | ICD-10-CM | POA: Diagnosis not present

## 2021-11-15 DIAGNOSIS — Z7982 Long term (current) use of aspirin: Secondary | ICD-10-CM | POA: Diagnosis not present

## 2021-11-15 DIAGNOSIS — Z87891 Personal history of nicotine dependence: Secondary | ICD-10-CM | POA: Diagnosis not present

## 2021-11-15 DIAGNOSIS — I129 Hypertensive chronic kidney disease with stage 1 through stage 4 chronic kidney disease, or unspecified chronic kidney disease: Secondary | ICD-10-CM | POA: Diagnosis not present

## 2021-11-15 DIAGNOSIS — I69393 Ataxia following cerebral infarction: Secondary | ICD-10-CM | POA: Diagnosis not present

## 2021-11-15 DIAGNOSIS — R7303 Prediabetes: Secondary | ICD-10-CM | POA: Diagnosis not present

## 2021-11-15 DIAGNOSIS — M80851D Other osteoporosis with current pathological fracture, right femur, subsequent encounter for fracture with routine healing: Secondary | ICD-10-CM | POA: Diagnosis not present

## 2021-11-15 DIAGNOSIS — M8580 Other specified disorders of bone density and structure, unspecified site: Secondary | ICD-10-CM | POA: Diagnosis not present

## 2021-11-16 DIAGNOSIS — E039 Hypothyroidism, unspecified: Secondary | ICD-10-CM | POA: Diagnosis not present

## 2021-11-16 DIAGNOSIS — I700 Atherosclerosis of aorta: Secondary | ICD-10-CM | POA: Diagnosis not present

## 2021-11-16 DIAGNOSIS — Z9181 History of falling: Secondary | ICD-10-CM | POA: Diagnosis not present

## 2021-11-16 DIAGNOSIS — M8580 Other specified disorders of bone density and structure, unspecified site: Secondary | ICD-10-CM | POA: Diagnosis not present

## 2021-11-16 DIAGNOSIS — R7303 Prediabetes: Secondary | ICD-10-CM | POA: Diagnosis not present

## 2021-11-16 DIAGNOSIS — J439 Emphysema, unspecified: Secondary | ICD-10-CM | POA: Diagnosis not present

## 2021-11-16 DIAGNOSIS — I69393 Ataxia following cerebral infarction: Secondary | ICD-10-CM | POA: Diagnosis not present

## 2021-11-16 DIAGNOSIS — E7800 Pure hypercholesterolemia, unspecified: Secondary | ICD-10-CM | POA: Diagnosis not present

## 2021-11-16 DIAGNOSIS — Z87891 Personal history of nicotine dependence: Secondary | ICD-10-CM | POA: Diagnosis not present

## 2021-11-16 DIAGNOSIS — N183 Chronic kidney disease, stage 3 unspecified: Secondary | ICD-10-CM | POA: Diagnosis not present

## 2021-11-16 DIAGNOSIS — Z96641 Presence of right artificial hip joint: Secondary | ICD-10-CM | POA: Diagnosis not present

## 2021-11-16 DIAGNOSIS — Z7982 Long term (current) use of aspirin: Secondary | ICD-10-CM | POA: Diagnosis not present

## 2021-11-16 DIAGNOSIS — M80851D Other osteoporosis with current pathological fracture, right femur, subsequent encounter for fracture with routine healing: Secondary | ICD-10-CM | POA: Diagnosis not present

## 2021-11-16 DIAGNOSIS — M47812 Spondylosis without myelopathy or radiculopathy, cervical region: Secondary | ICD-10-CM | POA: Diagnosis not present

## 2021-11-16 DIAGNOSIS — I129 Hypertensive chronic kidney disease with stage 1 through stage 4 chronic kidney disease, or unspecified chronic kidney disease: Secondary | ICD-10-CM | POA: Diagnosis not present

## 2021-11-19 DIAGNOSIS — I69393 Ataxia following cerebral infarction: Secondary | ICD-10-CM | POA: Diagnosis not present

## 2021-11-19 DIAGNOSIS — R7303 Prediabetes: Secondary | ICD-10-CM | POA: Diagnosis not present

## 2021-11-19 DIAGNOSIS — E039 Hypothyroidism, unspecified: Secondary | ICD-10-CM | POA: Diagnosis not present

## 2021-11-19 DIAGNOSIS — E7800 Pure hypercholesterolemia, unspecified: Secondary | ICD-10-CM | POA: Diagnosis not present

## 2021-11-19 DIAGNOSIS — I700 Atherosclerosis of aorta: Secondary | ICD-10-CM | POA: Diagnosis not present

## 2021-11-19 DIAGNOSIS — Z9181 History of falling: Secondary | ICD-10-CM | POA: Diagnosis not present

## 2021-11-19 DIAGNOSIS — Z7982 Long term (current) use of aspirin: Secondary | ICD-10-CM | POA: Diagnosis not present

## 2021-11-19 DIAGNOSIS — M47812 Spondylosis without myelopathy or radiculopathy, cervical region: Secondary | ICD-10-CM | POA: Diagnosis not present

## 2021-11-19 DIAGNOSIS — N183 Chronic kidney disease, stage 3 unspecified: Secondary | ICD-10-CM | POA: Diagnosis not present

## 2021-11-19 DIAGNOSIS — Z96641 Presence of right artificial hip joint: Secondary | ICD-10-CM | POA: Diagnosis not present

## 2021-11-19 DIAGNOSIS — Z87891 Personal history of nicotine dependence: Secondary | ICD-10-CM | POA: Diagnosis not present

## 2021-11-19 DIAGNOSIS — M80851D Other osteoporosis with current pathological fracture, right femur, subsequent encounter for fracture with routine healing: Secondary | ICD-10-CM | POA: Diagnosis not present

## 2021-11-19 DIAGNOSIS — J439 Emphysema, unspecified: Secondary | ICD-10-CM | POA: Diagnosis not present

## 2021-11-19 DIAGNOSIS — M8580 Other specified disorders of bone density and structure, unspecified site: Secondary | ICD-10-CM | POA: Diagnosis not present

## 2021-11-19 DIAGNOSIS — I129 Hypertensive chronic kidney disease with stage 1 through stage 4 chronic kidney disease, or unspecified chronic kidney disease: Secondary | ICD-10-CM | POA: Diagnosis not present

## 2021-11-24 DIAGNOSIS — Z96641 Presence of right artificial hip joint: Secondary | ICD-10-CM | POA: Diagnosis not present

## 2021-11-24 DIAGNOSIS — Z9181 History of falling: Secondary | ICD-10-CM | POA: Diagnosis not present

## 2021-11-24 DIAGNOSIS — M80851D Other osteoporosis with current pathological fracture, right femur, subsequent encounter for fracture with routine healing: Secondary | ICD-10-CM | POA: Diagnosis not present

## 2021-11-24 DIAGNOSIS — N183 Chronic kidney disease, stage 3 unspecified: Secondary | ICD-10-CM | POA: Diagnosis not present

## 2021-11-24 DIAGNOSIS — I69393 Ataxia following cerebral infarction: Secondary | ICD-10-CM | POA: Diagnosis not present

## 2021-11-24 DIAGNOSIS — I700 Atherosclerosis of aorta: Secondary | ICD-10-CM | POA: Diagnosis not present

## 2021-11-24 DIAGNOSIS — E039 Hypothyroidism, unspecified: Secondary | ICD-10-CM | POA: Diagnosis not present

## 2021-11-24 DIAGNOSIS — M47812 Spondylosis without myelopathy or radiculopathy, cervical region: Secondary | ICD-10-CM | POA: Diagnosis not present

## 2021-11-24 DIAGNOSIS — J439 Emphysema, unspecified: Secondary | ICD-10-CM | POA: Diagnosis not present

## 2021-11-24 DIAGNOSIS — Z7982 Long term (current) use of aspirin: Secondary | ICD-10-CM | POA: Diagnosis not present

## 2021-11-24 DIAGNOSIS — Z87891 Personal history of nicotine dependence: Secondary | ICD-10-CM | POA: Diagnosis not present

## 2021-11-24 DIAGNOSIS — E7800 Pure hypercholesterolemia, unspecified: Secondary | ICD-10-CM | POA: Diagnosis not present

## 2021-11-24 DIAGNOSIS — I129 Hypertensive chronic kidney disease with stage 1 through stage 4 chronic kidney disease, or unspecified chronic kidney disease: Secondary | ICD-10-CM | POA: Diagnosis not present

## 2021-11-24 DIAGNOSIS — R7303 Prediabetes: Secondary | ICD-10-CM | POA: Diagnosis not present

## 2021-11-24 DIAGNOSIS — M8580 Other specified disorders of bone density and structure, unspecified site: Secondary | ICD-10-CM | POA: Diagnosis not present

## 2021-11-25 DIAGNOSIS — S72001D Fracture of unspecified part of neck of right femur, subsequent encounter for closed fracture with routine healing: Secondary | ICD-10-CM | POA: Diagnosis not present

## 2021-11-26 DIAGNOSIS — Z9181 History of falling: Secondary | ICD-10-CM | POA: Diagnosis not present

## 2021-11-26 DIAGNOSIS — J439 Emphysema, unspecified: Secondary | ICD-10-CM | POA: Diagnosis not present

## 2021-11-26 DIAGNOSIS — Z96641 Presence of right artificial hip joint: Secondary | ICD-10-CM | POA: Diagnosis not present

## 2021-11-26 DIAGNOSIS — M80851D Other osteoporosis with current pathological fracture, right femur, subsequent encounter for fracture with routine healing: Secondary | ICD-10-CM | POA: Diagnosis not present

## 2021-11-26 DIAGNOSIS — M47812 Spondylosis without myelopathy or radiculopathy, cervical region: Secondary | ICD-10-CM | POA: Diagnosis not present

## 2021-11-26 DIAGNOSIS — Z7982 Long term (current) use of aspirin: Secondary | ICD-10-CM | POA: Diagnosis not present

## 2021-11-26 DIAGNOSIS — R7303 Prediabetes: Secondary | ICD-10-CM | POA: Diagnosis not present

## 2021-11-26 DIAGNOSIS — I69393 Ataxia following cerebral infarction: Secondary | ICD-10-CM | POA: Diagnosis not present

## 2021-11-26 DIAGNOSIS — I129 Hypertensive chronic kidney disease with stage 1 through stage 4 chronic kidney disease, or unspecified chronic kidney disease: Secondary | ICD-10-CM | POA: Diagnosis not present

## 2021-11-26 DIAGNOSIS — I700 Atherosclerosis of aorta: Secondary | ICD-10-CM | POA: Diagnosis not present

## 2021-11-26 DIAGNOSIS — N183 Chronic kidney disease, stage 3 unspecified: Secondary | ICD-10-CM | POA: Diagnosis not present

## 2021-11-26 DIAGNOSIS — E039 Hypothyroidism, unspecified: Secondary | ICD-10-CM | POA: Diagnosis not present

## 2021-11-26 DIAGNOSIS — Z87891 Personal history of nicotine dependence: Secondary | ICD-10-CM | POA: Diagnosis not present

## 2021-11-26 DIAGNOSIS — M8580 Other specified disorders of bone density and structure, unspecified site: Secondary | ICD-10-CM | POA: Diagnosis not present

## 2021-11-26 DIAGNOSIS — E7800 Pure hypercholesterolemia, unspecified: Secondary | ICD-10-CM | POA: Diagnosis not present

## 2021-11-28 ENCOUNTER — Other Ambulatory Visit: Payer: Self-pay | Admitting: Adult Health

## 2021-11-28 DIAGNOSIS — E039 Hypothyroidism, unspecified: Secondary | ICD-10-CM

## 2021-11-30 DIAGNOSIS — Z96641 Presence of right artificial hip joint: Secondary | ICD-10-CM | POA: Diagnosis not present

## 2021-11-30 DIAGNOSIS — M8580 Other specified disorders of bone density and structure, unspecified site: Secondary | ICD-10-CM | POA: Diagnosis not present

## 2021-11-30 DIAGNOSIS — Z9181 History of falling: Secondary | ICD-10-CM | POA: Diagnosis not present

## 2021-11-30 DIAGNOSIS — J439 Emphysema, unspecified: Secondary | ICD-10-CM | POA: Diagnosis not present

## 2021-11-30 DIAGNOSIS — Z87891 Personal history of nicotine dependence: Secondary | ICD-10-CM | POA: Diagnosis not present

## 2021-11-30 DIAGNOSIS — Z7982 Long term (current) use of aspirin: Secondary | ICD-10-CM | POA: Diagnosis not present

## 2021-11-30 DIAGNOSIS — I700 Atherosclerosis of aorta: Secondary | ICD-10-CM | POA: Diagnosis not present

## 2021-11-30 DIAGNOSIS — N183 Chronic kidney disease, stage 3 unspecified: Secondary | ICD-10-CM | POA: Diagnosis not present

## 2021-11-30 DIAGNOSIS — M80851D Other osteoporosis with current pathological fracture, right femur, subsequent encounter for fracture with routine healing: Secondary | ICD-10-CM | POA: Diagnosis not present

## 2021-11-30 DIAGNOSIS — M47812 Spondylosis without myelopathy or radiculopathy, cervical region: Secondary | ICD-10-CM | POA: Diagnosis not present

## 2021-11-30 DIAGNOSIS — I129 Hypertensive chronic kidney disease with stage 1 through stage 4 chronic kidney disease, or unspecified chronic kidney disease: Secondary | ICD-10-CM | POA: Diagnosis not present

## 2021-11-30 DIAGNOSIS — E039 Hypothyroidism, unspecified: Secondary | ICD-10-CM | POA: Diagnosis not present

## 2021-11-30 DIAGNOSIS — I69393 Ataxia following cerebral infarction: Secondary | ICD-10-CM | POA: Diagnosis not present

## 2021-11-30 DIAGNOSIS — E7800 Pure hypercholesterolemia, unspecified: Secondary | ICD-10-CM | POA: Diagnosis not present

## 2021-11-30 DIAGNOSIS — R7303 Prediabetes: Secondary | ICD-10-CM | POA: Diagnosis not present

## 2021-12-01 ENCOUNTER — Other Ambulatory Visit: Payer: Self-pay | Admitting: Adult Health

## 2021-12-01 DIAGNOSIS — Z9181 History of falling: Secondary | ICD-10-CM | POA: Diagnosis not present

## 2021-12-01 DIAGNOSIS — E039 Hypothyroidism, unspecified: Secondary | ICD-10-CM | POA: Diagnosis not present

## 2021-12-01 DIAGNOSIS — I700 Atherosclerosis of aorta: Secondary | ICD-10-CM | POA: Diagnosis not present

## 2021-12-01 DIAGNOSIS — M8580 Other specified disorders of bone density and structure, unspecified site: Secondary | ICD-10-CM | POA: Diagnosis not present

## 2021-12-01 DIAGNOSIS — N183 Chronic kidney disease, stage 3 unspecified: Secondary | ICD-10-CM | POA: Diagnosis not present

## 2021-12-01 DIAGNOSIS — Z96641 Presence of right artificial hip joint: Secondary | ICD-10-CM | POA: Diagnosis not present

## 2021-12-01 DIAGNOSIS — R7303 Prediabetes: Secondary | ICD-10-CM | POA: Diagnosis not present

## 2021-12-01 DIAGNOSIS — E785 Hyperlipidemia, unspecified: Secondary | ICD-10-CM

## 2021-12-01 DIAGNOSIS — M80851D Other osteoporosis with current pathological fracture, right femur, subsequent encounter for fracture with routine healing: Secondary | ICD-10-CM | POA: Diagnosis not present

## 2021-12-01 DIAGNOSIS — M47812 Spondylosis without myelopathy or radiculopathy, cervical region: Secondary | ICD-10-CM | POA: Diagnosis not present

## 2021-12-01 DIAGNOSIS — Z7982 Long term (current) use of aspirin: Secondary | ICD-10-CM | POA: Diagnosis not present

## 2021-12-01 DIAGNOSIS — Z87891 Personal history of nicotine dependence: Secondary | ICD-10-CM | POA: Diagnosis not present

## 2021-12-01 DIAGNOSIS — I129 Hypertensive chronic kidney disease with stage 1 through stage 4 chronic kidney disease, or unspecified chronic kidney disease: Secondary | ICD-10-CM | POA: Diagnosis not present

## 2021-12-01 DIAGNOSIS — I69393 Ataxia following cerebral infarction: Secondary | ICD-10-CM | POA: Diagnosis not present

## 2021-12-01 DIAGNOSIS — E7800 Pure hypercholesterolemia, unspecified: Secondary | ICD-10-CM | POA: Diagnosis not present

## 2021-12-01 DIAGNOSIS — J439 Emphysema, unspecified: Secondary | ICD-10-CM | POA: Diagnosis not present

## 2021-12-07 DIAGNOSIS — E039 Hypothyroidism, unspecified: Secondary | ICD-10-CM | POA: Diagnosis not present

## 2021-12-07 DIAGNOSIS — M8580 Other specified disorders of bone density and structure, unspecified site: Secondary | ICD-10-CM | POA: Diagnosis not present

## 2021-12-07 DIAGNOSIS — N183 Chronic kidney disease, stage 3 unspecified: Secondary | ICD-10-CM | POA: Diagnosis not present

## 2021-12-07 DIAGNOSIS — R7303 Prediabetes: Secondary | ICD-10-CM | POA: Diagnosis not present

## 2021-12-07 DIAGNOSIS — Z9181 History of falling: Secondary | ICD-10-CM | POA: Diagnosis not present

## 2021-12-07 DIAGNOSIS — Z87891 Personal history of nicotine dependence: Secondary | ICD-10-CM | POA: Diagnosis not present

## 2021-12-07 DIAGNOSIS — M47812 Spondylosis without myelopathy or radiculopathy, cervical region: Secondary | ICD-10-CM | POA: Diagnosis not present

## 2021-12-07 DIAGNOSIS — M80851D Other osteoporosis with current pathological fracture, right femur, subsequent encounter for fracture with routine healing: Secondary | ICD-10-CM | POA: Diagnosis not present

## 2021-12-07 DIAGNOSIS — Z96641 Presence of right artificial hip joint: Secondary | ICD-10-CM | POA: Diagnosis not present

## 2021-12-07 DIAGNOSIS — I69393 Ataxia following cerebral infarction: Secondary | ICD-10-CM | POA: Diagnosis not present

## 2021-12-07 DIAGNOSIS — J439 Emphysema, unspecified: Secondary | ICD-10-CM | POA: Diagnosis not present

## 2021-12-07 DIAGNOSIS — I129 Hypertensive chronic kidney disease with stage 1 through stage 4 chronic kidney disease, or unspecified chronic kidney disease: Secondary | ICD-10-CM | POA: Diagnosis not present

## 2021-12-07 DIAGNOSIS — E7800 Pure hypercholesterolemia, unspecified: Secondary | ICD-10-CM | POA: Diagnosis not present

## 2021-12-07 DIAGNOSIS — Z7982 Long term (current) use of aspirin: Secondary | ICD-10-CM | POA: Diagnosis not present

## 2021-12-07 DIAGNOSIS — I700 Atherosclerosis of aorta: Secondary | ICD-10-CM | POA: Diagnosis not present

## 2021-12-08 DIAGNOSIS — R269 Unspecified abnormalities of gait and mobility: Secondary | ICD-10-CM | POA: Diagnosis not present

## 2021-12-08 DIAGNOSIS — N183 Chronic kidney disease, stage 3 unspecified: Secondary | ICD-10-CM | POA: Diagnosis not present

## 2021-12-08 DIAGNOSIS — E039 Hypothyroidism, unspecified: Secondary | ICD-10-CM | POA: Diagnosis not present

## 2021-12-08 DIAGNOSIS — R251 Tremor, unspecified: Secondary | ICD-10-CM | POA: Diagnosis not present

## 2021-12-08 DIAGNOSIS — Z0000 Encounter for general adult medical examination without abnormal findings: Secondary | ICD-10-CM | POA: Diagnosis not present

## 2021-12-08 DIAGNOSIS — E7800 Pure hypercholesterolemia, unspecified: Secondary | ICD-10-CM | POA: Diagnosis not present

## 2021-12-08 DIAGNOSIS — I10 Essential (primary) hypertension: Secondary | ICD-10-CM | POA: Diagnosis not present

## 2021-12-08 DIAGNOSIS — Z1389 Encounter for screening for other disorder: Secondary | ICD-10-CM | POA: Diagnosis not present

## 2021-12-08 DIAGNOSIS — R7303 Prediabetes: Secondary | ICD-10-CM | POA: Diagnosis not present

## 2021-12-08 DIAGNOSIS — I6930 Unspecified sequelae of cerebral infarction: Secondary | ICD-10-CM | POA: Diagnosis not present

## 2021-12-08 DIAGNOSIS — E871 Hypo-osmolality and hyponatremia: Secondary | ICD-10-CM | POA: Diagnosis not present

## 2021-12-08 DIAGNOSIS — R609 Edema, unspecified: Secondary | ICD-10-CM | POA: Diagnosis not present

## 2021-12-09 DIAGNOSIS — R7303 Prediabetes: Secondary | ICD-10-CM | POA: Diagnosis not present

## 2021-12-09 DIAGNOSIS — I69393 Ataxia following cerebral infarction: Secondary | ICD-10-CM | POA: Diagnosis not present

## 2021-12-09 DIAGNOSIS — Z7982 Long term (current) use of aspirin: Secondary | ICD-10-CM | POA: Diagnosis not present

## 2021-12-09 DIAGNOSIS — N183 Chronic kidney disease, stage 3 unspecified: Secondary | ICD-10-CM | POA: Diagnosis not present

## 2021-12-09 DIAGNOSIS — M8580 Other specified disorders of bone density and structure, unspecified site: Secondary | ICD-10-CM | POA: Diagnosis not present

## 2021-12-09 DIAGNOSIS — Z87891 Personal history of nicotine dependence: Secondary | ICD-10-CM | POA: Diagnosis not present

## 2021-12-09 DIAGNOSIS — E039 Hypothyroidism, unspecified: Secondary | ICD-10-CM | POA: Diagnosis not present

## 2021-12-09 DIAGNOSIS — Z9181 History of falling: Secondary | ICD-10-CM | POA: Diagnosis not present

## 2021-12-09 DIAGNOSIS — I700 Atherosclerosis of aorta: Secondary | ICD-10-CM | POA: Diagnosis not present

## 2021-12-09 DIAGNOSIS — I129 Hypertensive chronic kidney disease with stage 1 through stage 4 chronic kidney disease, or unspecified chronic kidney disease: Secondary | ICD-10-CM | POA: Diagnosis not present

## 2021-12-09 DIAGNOSIS — E7800 Pure hypercholesterolemia, unspecified: Secondary | ICD-10-CM | POA: Diagnosis not present

## 2021-12-09 DIAGNOSIS — M80851D Other osteoporosis with current pathological fracture, right femur, subsequent encounter for fracture with routine healing: Secondary | ICD-10-CM | POA: Diagnosis not present

## 2021-12-09 DIAGNOSIS — Z96641 Presence of right artificial hip joint: Secondary | ICD-10-CM | POA: Diagnosis not present

## 2021-12-09 DIAGNOSIS — M47812 Spondylosis without myelopathy or radiculopathy, cervical region: Secondary | ICD-10-CM | POA: Diagnosis not present

## 2021-12-09 DIAGNOSIS — J439 Emphysema, unspecified: Secondary | ICD-10-CM | POA: Diagnosis not present

## 2021-12-14 DIAGNOSIS — Z9181 History of falling: Secondary | ICD-10-CM | POA: Diagnosis not present

## 2021-12-14 DIAGNOSIS — Z87891 Personal history of nicotine dependence: Secondary | ICD-10-CM | POA: Diagnosis not present

## 2021-12-14 DIAGNOSIS — J439 Emphysema, unspecified: Secondary | ICD-10-CM | POA: Diagnosis not present

## 2021-12-14 DIAGNOSIS — I700 Atherosclerosis of aorta: Secondary | ICD-10-CM | POA: Diagnosis not present

## 2021-12-14 DIAGNOSIS — Z96641 Presence of right artificial hip joint: Secondary | ICD-10-CM | POA: Diagnosis not present

## 2021-12-14 DIAGNOSIS — R7303 Prediabetes: Secondary | ICD-10-CM | POA: Diagnosis not present

## 2021-12-14 DIAGNOSIS — I69393 Ataxia following cerebral infarction: Secondary | ICD-10-CM | POA: Diagnosis not present

## 2021-12-14 DIAGNOSIS — M8580 Other specified disorders of bone density and structure, unspecified site: Secondary | ICD-10-CM | POA: Diagnosis not present

## 2021-12-14 DIAGNOSIS — N183 Chronic kidney disease, stage 3 unspecified: Secondary | ICD-10-CM | POA: Diagnosis not present

## 2021-12-14 DIAGNOSIS — I129 Hypertensive chronic kidney disease with stage 1 through stage 4 chronic kidney disease, or unspecified chronic kidney disease: Secondary | ICD-10-CM | POA: Diagnosis not present

## 2021-12-14 DIAGNOSIS — M80851D Other osteoporosis with current pathological fracture, right femur, subsequent encounter for fracture with routine healing: Secondary | ICD-10-CM | POA: Diagnosis not present

## 2021-12-14 DIAGNOSIS — E7800 Pure hypercholesterolemia, unspecified: Secondary | ICD-10-CM | POA: Diagnosis not present

## 2021-12-14 DIAGNOSIS — E039 Hypothyroidism, unspecified: Secondary | ICD-10-CM | POA: Diagnosis not present

## 2021-12-14 DIAGNOSIS — Z7982 Long term (current) use of aspirin: Secondary | ICD-10-CM | POA: Diagnosis not present

## 2021-12-14 DIAGNOSIS — M47812 Spondylosis without myelopathy or radiculopathy, cervical region: Secondary | ICD-10-CM | POA: Diagnosis not present

## 2021-12-15 DIAGNOSIS — Z96641 Presence of right artificial hip joint: Secondary | ICD-10-CM | POA: Diagnosis not present

## 2021-12-15 DIAGNOSIS — E7800 Pure hypercholesterolemia, unspecified: Secondary | ICD-10-CM | POA: Diagnosis not present

## 2021-12-15 DIAGNOSIS — I700 Atherosclerosis of aorta: Secondary | ICD-10-CM | POA: Diagnosis not present

## 2021-12-15 DIAGNOSIS — I129 Hypertensive chronic kidney disease with stage 1 through stage 4 chronic kidney disease, or unspecified chronic kidney disease: Secondary | ICD-10-CM | POA: Diagnosis not present

## 2021-12-15 DIAGNOSIS — J439 Emphysema, unspecified: Secondary | ICD-10-CM | POA: Diagnosis not present

## 2021-12-15 DIAGNOSIS — M80851D Other osteoporosis with current pathological fracture, right femur, subsequent encounter for fracture with routine healing: Secondary | ICD-10-CM | POA: Diagnosis not present

## 2021-12-15 DIAGNOSIS — R7303 Prediabetes: Secondary | ICD-10-CM | POA: Diagnosis not present

## 2021-12-15 DIAGNOSIS — M47812 Spondylosis without myelopathy or radiculopathy, cervical region: Secondary | ICD-10-CM | POA: Diagnosis not present

## 2021-12-15 DIAGNOSIS — Z9181 History of falling: Secondary | ICD-10-CM | POA: Diagnosis not present

## 2021-12-15 DIAGNOSIS — Z7982 Long term (current) use of aspirin: Secondary | ICD-10-CM | POA: Diagnosis not present

## 2021-12-15 DIAGNOSIS — E039 Hypothyroidism, unspecified: Secondary | ICD-10-CM | POA: Diagnosis not present

## 2021-12-15 DIAGNOSIS — N183 Chronic kidney disease, stage 3 unspecified: Secondary | ICD-10-CM | POA: Diagnosis not present

## 2021-12-15 DIAGNOSIS — I69393 Ataxia following cerebral infarction: Secondary | ICD-10-CM | POA: Diagnosis not present

## 2021-12-15 DIAGNOSIS — Z87891 Personal history of nicotine dependence: Secondary | ICD-10-CM | POA: Diagnosis not present

## 2021-12-15 DIAGNOSIS — M8580 Other specified disorders of bone density and structure, unspecified site: Secondary | ICD-10-CM | POA: Diagnosis not present

## 2021-12-22 DIAGNOSIS — I69393 Ataxia following cerebral infarction: Secondary | ICD-10-CM | POA: Diagnosis not present

## 2021-12-22 DIAGNOSIS — Z96641 Presence of right artificial hip joint: Secondary | ICD-10-CM | POA: Diagnosis not present

## 2021-12-22 DIAGNOSIS — M47812 Spondylosis without myelopathy or radiculopathy, cervical region: Secondary | ICD-10-CM | POA: Diagnosis not present

## 2021-12-22 DIAGNOSIS — Z87891 Personal history of nicotine dependence: Secondary | ICD-10-CM | POA: Diagnosis not present

## 2021-12-22 DIAGNOSIS — I700 Atherosclerosis of aorta: Secondary | ICD-10-CM | POA: Diagnosis not present

## 2021-12-22 DIAGNOSIS — M8580 Other specified disorders of bone density and structure, unspecified site: Secondary | ICD-10-CM | POA: Diagnosis not present

## 2021-12-22 DIAGNOSIS — N183 Chronic kidney disease, stage 3 unspecified: Secondary | ICD-10-CM | POA: Diagnosis not present

## 2021-12-22 DIAGNOSIS — R7303 Prediabetes: Secondary | ICD-10-CM | POA: Diagnosis not present

## 2021-12-22 DIAGNOSIS — M80851D Other osteoporosis with current pathological fracture, right femur, subsequent encounter for fracture with routine healing: Secondary | ICD-10-CM | POA: Diagnosis not present

## 2021-12-22 DIAGNOSIS — I129 Hypertensive chronic kidney disease with stage 1 through stage 4 chronic kidney disease, or unspecified chronic kidney disease: Secondary | ICD-10-CM | POA: Diagnosis not present

## 2021-12-22 DIAGNOSIS — E7800 Pure hypercholesterolemia, unspecified: Secondary | ICD-10-CM | POA: Diagnosis not present

## 2021-12-22 DIAGNOSIS — Z7982 Long term (current) use of aspirin: Secondary | ICD-10-CM | POA: Diagnosis not present

## 2021-12-22 DIAGNOSIS — J439 Emphysema, unspecified: Secondary | ICD-10-CM | POA: Diagnosis not present

## 2021-12-22 DIAGNOSIS — E039 Hypothyroidism, unspecified: Secondary | ICD-10-CM | POA: Diagnosis not present

## 2021-12-22 DIAGNOSIS — Z9181 History of falling: Secondary | ICD-10-CM | POA: Diagnosis not present

## 2021-12-29 DIAGNOSIS — Z9181 History of falling: Secondary | ICD-10-CM | POA: Diagnosis not present

## 2021-12-29 DIAGNOSIS — N183 Chronic kidney disease, stage 3 unspecified: Secondary | ICD-10-CM | POA: Diagnosis not present

## 2021-12-29 DIAGNOSIS — E7800 Pure hypercholesterolemia, unspecified: Secondary | ICD-10-CM | POA: Diagnosis not present

## 2021-12-29 DIAGNOSIS — I129 Hypertensive chronic kidney disease with stage 1 through stage 4 chronic kidney disease, or unspecified chronic kidney disease: Secondary | ICD-10-CM | POA: Diagnosis not present

## 2021-12-29 DIAGNOSIS — Z87891 Personal history of nicotine dependence: Secondary | ICD-10-CM | POA: Diagnosis not present

## 2021-12-29 DIAGNOSIS — Z96641 Presence of right artificial hip joint: Secondary | ICD-10-CM | POA: Diagnosis not present

## 2021-12-29 DIAGNOSIS — E039 Hypothyroidism, unspecified: Secondary | ICD-10-CM | POA: Diagnosis not present

## 2021-12-29 DIAGNOSIS — J439 Emphysema, unspecified: Secondary | ICD-10-CM | POA: Diagnosis not present

## 2021-12-29 DIAGNOSIS — I69393 Ataxia following cerebral infarction: Secondary | ICD-10-CM | POA: Diagnosis not present

## 2021-12-29 DIAGNOSIS — R7303 Prediabetes: Secondary | ICD-10-CM | POA: Diagnosis not present

## 2021-12-29 DIAGNOSIS — M47812 Spondylosis without myelopathy or radiculopathy, cervical region: Secondary | ICD-10-CM | POA: Diagnosis not present

## 2021-12-29 DIAGNOSIS — M8580 Other specified disorders of bone density and structure, unspecified site: Secondary | ICD-10-CM | POA: Diagnosis not present

## 2021-12-29 DIAGNOSIS — M80851D Other osteoporosis with current pathological fracture, right femur, subsequent encounter for fracture with routine healing: Secondary | ICD-10-CM | POA: Diagnosis not present

## 2021-12-29 DIAGNOSIS — I700 Atherosclerosis of aorta: Secondary | ICD-10-CM | POA: Diagnosis not present

## 2021-12-29 DIAGNOSIS — Z7982 Long term (current) use of aspirin: Secondary | ICD-10-CM | POA: Diagnosis not present

## 2022-01-04 DIAGNOSIS — Z87891 Personal history of nicotine dependence: Secondary | ICD-10-CM | POA: Diagnosis not present

## 2022-01-04 DIAGNOSIS — Z7982 Long term (current) use of aspirin: Secondary | ICD-10-CM | POA: Diagnosis not present

## 2022-01-04 DIAGNOSIS — M8580 Other specified disorders of bone density and structure, unspecified site: Secondary | ICD-10-CM | POA: Diagnosis not present

## 2022-01-04 DIAGNOSIS — I129 Hypertensive chronic kidney disease with stage 1 through stage 4 chronic kidney disease, or unspecified chronic kidney disease: Secondary | ICD-10-CM | POA: Diagnosis not present

## 2022-01-04 DIAGNOSIS — M47812 Spondylosis without myelopathy or radiculopathy, cervical region: Secondary | ICD-10-CM | POA: Diagnosis not present

## 2022-01-04 DIAGNOSIS — I700 Atherosclerosis of aorta: Secondary | ICD-10-CM | POA: Diagnosis not present

## 2022-01-04 DIAGNOSIS — Z96641 Presence of right artificial hip joint: Secondary | ICD-10-CM | POA: Diagnosis not present

## 2022-01-04 DIAGNOSIS — I69393 Ataxia following cerebral infarction: Secondary | ICD-10-CM | POA: Diagnosis not present

## 2022-01-04 DIAGNOSIS — Z9181 History of falling: Secondary | ICD-10-CM | POA: Diagnosis not present

## 2022-01-04 DIAGNOSIS — N183 Chronic kidney disease, stage 3 unspecified: Secondary | ICD-10-CM | POA: Diagnosis not present

## 2022-01-04 DIAGNOSIS — R7303 Prediabetes: Secondary | ICD-10-CM | POA: Diagnosis not present

## 2022-01-04 DIAGNOSIS — E039 Hypothyroidism, unspecified: Secondary | ICD-10-CM | POA: Diagnosis not present

## 2022-01-04 DIAGNOSIS — J439 Emphysema, unspecified: Secondary | ICD-10-CM | POA: Diagnosis not present

## 2022-01-04 DIAGNOSIS — E7800 Pure hypercholesterolemia, unspecified: Secondary | ICD-10-CM | POA: Diagnosis not present

## 2022-01-04 DIAGNOSIS — M80851D Other osteoporosis with current pathological fracture, right femur, subsequent encounter for fracture with routine healing: Secondary | ICD-10-CM | POA: Diagnosis not present

## 2022-01-25 DIAGNOSIS — S72001D Fracture of unspecified part of neck of right femur, subsequent encounter for closed fracture with routine healing: Secondary | ICD-10-CM | POA: Diagnosis not present

## 2022-02-04 DIAGNOSIS — E039 Hypothyroidism, unspecified: Secondary | ICD-10-CM | POA: Diagnosis not present

## 2022-02-28 ENCOUNTER — Other Ambulatory Visit: Payer: Self-pay

## 2022-02-28 ENCOUNTER — Encounter (HOSPITAL_COMMUNITY): Payer: Self-pay

## 2022-02-28 ENCOUNTER — Inpatient Hospital Stay (HOSPITAL_COMMUNITY): Payer: Medicare Other

## 2022-02-28 ENCOUNTER — Inpatient Hospital Stay (HOSPITAL_COMMUNITY)
Admission: EM | Admit: 2022-02-28 | Discharge: 2022-03-03 | DRG: 521 | Disposition: A | Discharge status: Skilled Nursing Facility | Payer: Medicare Other | Attending: Internal Medicine | Admitting: Internal Medicine

## 2022-02-28 ENCOUNTER — Emergency Department (HOSPITAL_COMMUNITY): Payer: Medicare Other

## 2022-02-28 DIAGNOSIS — W19XXXA Unspecified fall, initial encounter: Secondary | ICD-10-CM | POA: Diagnosis not present

## 2022-02-28 DIAGNOSIS — R6889 Other general symptoms and signs: Secondary | ICD-10-CM | POA: Diagnosis not present

## 2022-02-28 DIAGNOSIS — R9431 Abnormal electrocardiogram [ECG] [EKG]: Secondary | ICD-10-CM | POA: Diagnosis not present

## 2022-02-28 DIAGNOSIS — W19XXXD Unspecified fall, subsequent encounter: Secondary | ICD-10-CM | POA: Diagnosis not present

## 2022-02-28 DIAGNOSIS — Z681 Body mass index (BMI) 19 or less, adult: Secondary | ICD-10-CM | POA: Diagnosis not present

## 2022-02-28 DIAGNOSIS — Y92009 Unspecified place in unspecified non-institutional (private) residence as the place of occurrence of the external cause: Secondary | ICD-10-CM

## 2022-02-28 DIAGNOSIS — Z888 Allergy status to other drugs, medicaments and biological substances status: Secondary | ICD-10-CM

## 2022-02-28 DIAGNOSIS — Z4789 Encounter for other orthopedic aftercare: Secondary | ICD-10-CM | POA: Diagnosis not present

## 2022-02-28 DIAGNOSIS — R404 Transient alteration of awareness: Secondary | ICD-10-CM | POA: Diagnosis not present

## 2022-02-28 DIAGNOSIS — Z79890 Hormone replacement therapy (postmenopausal): Secondary | ICD-10-CM

## 2022-02-28 DIAGNOSIS — K5900 Constipation, unspecified: Secondary | ICD-10-CM | POA: Diagnosis not present

## 2022-02-28 DIAGNOSIS — Z90710 Acquired absence of both cervix and uterus: Secondary | ICD-10-CM | POA: Diagnosis not present

## 2022-02-28 DIAGNOSIS — W010XXA Fall on same level from slipping, tripping and stumbling without subsequent striking against object, initial encounter: Secondary | ICD-10-CM | POA: Diagnosis present

## 2022-02-28 DIAGNOSIS — Z8673 Personal history of transient ischemic attack (TIA), and cerebral infarction without residual deficits: Secondary | ICD-10-CM | POA: Diagnosis not present

## 2022-02-28 DIAGNOSIS — Z4889 Encounter for other specified surgical aftercare: Secondary | ICD-10-CM | POA: Diagnosis not present

## 2022-02-28 DIAGNOSIS — Z96641 Presence of right artificial hip joint: Secondary | ICD-10-CM | POA: Diagnosis not present

## 2022-02-28 DIAGNOSIS — I959 Hypotension, unspecified: Secondary | ICD-10-CM | POA: Diagnosis not present

## 2022-02-28 DIAGNOSIS — Z79899 Other long term (current) drug therapy: Secondary | ICD-10-CM | POA: Diagnosis not present

## 2022-02-28 DIAGNOSIS — E785 Hyperlipidemia, unspecified: Secondary | ICD-10-CM | POA: Diagnosis not present

## 2022-02-28 DIAGNOSIS — E871 Hypo-osmolality and hyponatremia: Secondary | ICD-10-CM | POA: Diagnosis not present

## 2022-02-28 DIAGNOSIS — R519 Headache, unspecified: Secondary | ICD-10-CM | POA: Diagnosis not present

## 2022-02-28 DIAGNOSIS — Z96642 Presence of left artificial hip joint: Secondary | ICD-10-CM | POA: Diagnosis not present

## 2022-02-28 DIAGNOSIS — Z7401 Bed confinement status: Secondary | ICD-10-CM | POA: Diagnosis not present

## 2022-02-28 DIAGNOSIS — S72002D Fracture of unspecified part of neck of left femur, subsequent encounter for closed fracture with routine healing: Secondary | ICD-10-CM | POA: Diagnosis not present

## 2022-02-28 DIAGNOSIS — Z01818 Encounter for other preprocedural examination: Secondary | ICD-10-CM | POA: Diagnosis not present

## 2022-02-28 DIAGNOSIS — S72032A Displaced midcervical fracture of left femur, initial encounter for closed fracture: Secondary | ICD-10-CM | POA: Diagnosis not present

## 2022-02-28 DIAGNOSIS — Z7982 Long term (current) use of aspirin: Secondary | ICD-10-CM

## 2022-02-28 DIAGNOSIS — Z66 Do not resuscitate: Secondary | ICD-10-CM | POA: Diagnosis present

## 2022-02-28 DIAGNOSIS — F419 Anxiety disorder, unspecified: Secondary | ICD-10-CM

## 2022-02-28 DIAGNOSIS — R58 Hemorrhage, not elsewhere classified: Secondary | ICD-10-CM | POA: Diagnosis not present

## 2022-02-28 DIAGNOSIS — I739 Peripheral vascular disease, unspecified: Secondary | ICD-10-CM | POA: Diagnosis not present

## 2022-02-28 DIAGNOSIS — E039 Hypothyroidism, unspecified: Secondary | ICD-10-CM | POA: Diagnosis not present

## 2022-02-28 DIAGNOSIS — E43 Unspecified severe protein-calorie malnutrition: Secondary | ICD-10-CM | POA: Diagnosis not present

## 2022-02-28 DIAGNOSIS — S72002A Fracture of unspecified part of neck of left femur, initial encounter for closed fracture: Secondary | ICD-10-CM | POA: Diagnosis not present

## 2022-02-28 DIAGNOSIS — Z91040 Latex allergy status: Secondary | ICD-10-CM | POA: Diagnosis not present

## 2022-02-28 DIAGNOSIS — I10 Essential (primary) hypertension: Secondary | ICD-10-CM | POA: Diagnosis present

## 2022-02-28 DIAGNOSIS — S7292XA Unspecified fracture of left femur, initial encounter for closed fracture: Secondary | ICD-10-CM | POA: Diagnosis not present

## 2022-02-28 DIAGNOSIS — F17210 Nicotine dependence, cigarettes, uncomplicated: Secondary | ICD-10-CM | POA: Diagnosis not present

## 2022-02-28 DIAGNOSIS — Z043 Encounter for examination and observation following other accident: Secondary | ICD-10-CM | POA: Diagnosis not present

## 2022-02-28 DIAGNOSIS — K08109 Complete loss of teeth, unspecified cause, unspecified class: Secondary | ICD-10-CM | POA: Diagnosis present

## 2022-02-28 DIAGNOSIS — Z743 Need for continuous supervision: Secondary | ICD-10-CM | POA: Diagnosis not present

## 2022-02-28 DIAGNOSIS — I6200 Nontraumatic subdural hemorrhage, unspecified: Secondary | ICD-10-CM | POA: Diagnosis not present

## 2022-02-28 DIAGNOSIS — M25552 Pain in left hip: Secondary | ICD-10-CM | POA: Diagnosis not present

## 2022-02-28 DIAGNOSIS — S7292XD Unspecified fracture of left femur, subsequent encounter for closed fracture with routine healing: Secondary | ICD-10-CM | POA: Diagnosis not present

## 2022-02-28 DIAGNOSIS — M25551 Pain in right hip: Secondary | ICD-10-CM | POA: Diagnosis not present

## 2022-02-28 DIAGNOSIS — R079 Chest pain, unspecified: Secondary | ICD-10-CM | POA: Diagnosis not present

## 2022-02-28 LAB — CBC WITH DIFFERENTIAL/PLATELET
Abs Immature Granulocytes: 0.04 10*3/uL (ref 0.00–0.07)
Basophils Absolute: 0 10*3/uL (ref 0.0–0.1)
Basophils Relative: 1 %
Eosinophils Absolute: 0.1 10*3/uL (ref 0.0–0.5)
Eosinophils Relative: 2 %
HCT: 42 % (ref 36.0–46.0)
Hemoglobin: 13.8 g/dL (ref 12.0–15.0)
Immature Granulocytes: 1 %
Lymphocytes Relative: 20 %
Lymphs Abs: 1.6 10*3/uL (ref 0.7–4.0)
MCH: 30.2 pg (ref 26.0–34.0)
MCHC: 32.9 g/dL (ref 30.0–36.0)
MCV: 91.9 fL (ref 80.0–100.0)
Monocytes Absolute: 1 10*3/uL (ref 0.1–1.0)
Monocytes Relative: 13 %
Neutro Abs: 5 10*3/uL (ref 1.7–7.7)
Neutrophils Relative %: 63 %
Platelets: 293 10*3/uL (ref 150–400)
RBC: 4.57 MIL/uL (ref 3.87–5.11)
RDW: 14.4 % (ref 11.5–15.5)
WBC: 7.8 10*3/uL (ref 4.0–10.5)
nRBC: 0 % (ref 0.0–0.2)

## 2022-02-28 LAB — BASIC METABOLIC PANEL
Anion gap: 7 (ref 5–15)
BUN: 16 mg/dL (ref 8–23)
CO2: 28 mmol/L (ref 22–32)
Calcium: 9.5 mg/dL (ref 8.9–10.3)
Chloride: 102 mmol/L (ref 98–111)
Creatinine, Ser: 0.84 mg/dL (ref 0.44–1.00)
GFR, Estimated: >60 mL/min (ref >60)
Glucose, Bld: 119 mg/dL — ABNORMAL HIGH (ref 70–99)
Potassium: 4 mmol/L (ref 3.5–5.1)
Sodium: 137 mmol/L (ref 135–145)

## 2022-02-28 LAB — PROTIME-INR
INR: 0.9 (ref 0.8–1.2)
Prothrombin Time: 12.6 seconds (ref 11.4–15.2)

## 2022-02-28 LAB — ABO/RH: ABO/RH(D): O POS

## 2022-02-28 LAB — TYPE AND SCREEN
ABO/RH(D): O POS
Antibody Screen: NEGATIVE

## 2022-02-28 MED ORDER — ROSUVASTATIN CALCIUM 5 MG PO TABS
5.0000 mg | ORAL_TABLET | Freq: Every day | ORAL | Status: DC
  Administered 2022-02-28 – 2022-03-02 (×3): 5 mg via ORAL
  Filled 2022-02-28 (×3): qty 1

## 2022-02-28 MED ORDER — METOPROLOL TARTRATE 25 MG PO TABS
25.0000 mg | ORAL_TABLET | Freq: Two times a day (BID) | ORAL | Status: DC
Start: 2022-02-28 — End: 2022-03-03
  Administered 2022-02-28 – 2022-03-03 (×6): 25 mg via ORAL
  Filled 2022-02-28 (×6): qty 1

## 2022-02-28 MED ORDER — AMLODIPINE BESYLATE 5 MG PO TABS
5.0000 mg | ORAL_TABLET | Freq: Every morning | ORAL | Status: DC
  Administered 2022-03-01 – 2022-03-03 (×3): 5 mg via ORAL
  Filled 2022-02-28 (×3): qty 1

## 2022-02-28 MED ORDER — ONDANSETRON HCL 4 MG/2ML IJ SOLN
4.0000 mg | Freq: Once | INTRAMUSCULAR | Status: AC
  Administered 2022-02-28: 4 mg via INTRAVENOUS
  Filled 2022-02-28: qty 2

## 2022-02-28 MED ORDER — HYDROCODONE-ACETAMINOPHEN 5-325 MG PO TABS
1.0000 | ORAL_TABLET | Freq: Four times a day (QID) | ORAL | Status: DC | PRN
  Administered 2022-02-28 – 2022-03-01 (×2): 1 via ORAL
  Administered 2022-03-02: 2 via ORAL
  Administered 2022-03-02: 1 via ORAL
  Filled 2022-02-28 (×3): qty 1
  Filled 2022-02-28: qty 2

## 2022-02-28 MED ORDER — HYDRALAZINE HCL 25 MG PO TABS
25.0000 mg | ORAL_TABLET | Freq: Three times a day (TID) | ORAL | Status: DC
  Administered 2022-02-28 – 2022-03-03 (×7): 25 mg via ORAL
  Filled 2022-02-28 (×8): qty 1

## 2022-02-28 MED ORDER — LEVOTHYROXINE SODIUM 100 MCG PO TABS
100.0000 ug | ORAL_TABLET | Freq: Every day | ORAL | Status: DC
  Administered 2022-03-01 – 2022-03-03 (×3): 100 ug via ORAL
  Filled 2022-02-28 (×3): qty 1

## 2022-02-28 MED ORDER — ADULT MULTIVITAMIN W/MINERALS CH
1.0000 | ORAL_TABLET | Freq: Every day | ORAL | Status: DC
  Administered 2022-02-28 – 2022-03-02 (×2): 1 via ORAL
  Filled 2022-02-28 (×2): qty 1

## 2022-02-28 MED ORDER — ACETAMINOPHEN 325 MG PO TABS
650.0000 mg | ORAL_TABLET | Freq: Four times a day (QID) | ORAL | Status: DC | PRN
Start: 2022-02-28 — End: 2022-03-03
  Administered 2022-02-28 (×2): 650 mg via ORAL
  Filled 2022-02-28 (×3): qty 2

## 2022-02-28 MED ORDER — ASPIRIN EC 81 MG PO TBEC
81.0000 mg | DELAYED_RELEASE_TABLET | ORAL | Status: DC
  Filled 2022-02-28: qty 1

## 2022-02-28 MED ORDER — MORPHINE SULFATE (PF) 2 MG/ML IV SOLN: 0.5000 mg | INTRAVENOUS | Status: DC | PRN

## 2022-02-28 MED ORDER — LACTATED RINGERS IV SOLN: INTRAVENOUS | Status: DC

## 2022-02-28 MED ORDER — FENTANYL CITRATE PF 50 MCG/ML IJ SOSY
50.0000 ug | PREFILLED_SYRINGE | INTRAMUSCULAR | Status: AC | PRN
  Administered 2022-02-28 (×2): 50 ug via INTRAVENOUS
  Filled 2022-02-28 (×2): qty 1

## 2022-02-28 NOTE — H&P (View-Only) (Signed)
ORTHOPAEDIC CONSULTATION ? ?REQUESTING PHYSICIAN: Kyle, Tyrone A, DO ? ?Chief Complaint: left hip fracture ? ?HPI: ?Tiffany Thornton is a 88 y.o. female who had a right hip hemiarthroplasty for femoral neck fracture done in November of last year.   She had done well after surgery.  Unfortunately earlier this morning she was getting up from a chair, and was reaching for a rollator, but given the baseline tremor, she reports that she lost her balance and fell.  After the fall she had significant pain in the left groin area and inability to weight-bear.  She was brought to the emergency room and found to have a left displaced femoral neck fracture.  She denies pain in the joints or extremities.  She denies any pain in the right hip.  She denies any distal numbness and tingling.  She is not on anticoagulation. ? ? ? ?Past Medical History:  ?Diagnosis Date  ? Hyperlipidemia   ? Hypothyroid   ? Stroke (HCC)   ? ?Past Surgical History:  ?Procedure Laterality Date  ? ABDOMINAL HYSTERECTOMY    ? APPENDECTOMY    ? CHOLECYSTECTOMY    ? CRANIOTOMY N/A 04/30/2018  ? Procedure: CRANIOTOMY HEMATOMA EVACUATION SUBDURAL;  Surgeon: Pool, Henry, MD;  Location: [facility] OR;  Service: Neurosurgery;  Laterality: N/A;  suboccipital craniectomy for hematoma  ? HIP ARTHROPLASTY Right 10/06/2021  ? Procedure: ARTHROPLASTY BIPOLAR HIP (HEMIARTHROPLASTY);  Surgeon: Veronica Guerrant A, MD;  Location: [facility] OR;  Service: Orthopedics;  Laterality: Right;  ? ?Social History  ? ?Socioeconomic History  ? Marital status: Widowed  ?  Spouse name: Not on file  ? Number of children: Not on file  ? Years of education: Not on file  ? Highest education level: Not on file  ?Occupational History  ? Not on file  ?Tobacco Use  ? Smoking status: Some Days  ?  Packs/day: 0.00  ?  Years: 40.00  ?  Pack years: 0.00  ?  Types: Cigarettes  ?  Start date: 05/07/1978  ? Smokeless tobacco: Never  ? Tobacco comments:  ?  1 to 2 every other day  ?Vaping Use  ? Vaping Use: Never used   ?Substance and Sexual Activity  ? Alcohol use: Never  ? Drug use: Never  ? Sexual activity: Not Currently  ?  Partners: Male  ?Other Topics Concern  ? Not on file  ?Social History Narrative  ? Pt lives alone   ? ?Social Determinants of Health  ? ?Financial Resource Strain: Not on file  ?Food Insecurity: Not on file  ?Transportation Needs: Not on file  ?Physical Activity: Not on file  ?Stress: Not on file  ?Social Connections: Not on file  ? ?Family History  ?Problem Relation Age of Onset  ? Diabetes Mother   ? Lung cancer Father   ? Diabetes Sister   ? ?Allergies  ?Allergen Reactions  ? Hydrochlorothiazide Other (See Comments)  ?  Hyponatremia  ? Latex Itching  ? ? ? ?Positive ROS: All other systems have been reviewed and were otherwise negative with the exception of those mentioned in the HPI and as above. ? ?Physical Exam: ?General: Alert, no acute distress ?Cardiovascular: No pedal edema ?Respiratory: No cyanosis, no use of accessory musculature ?Skin: No lesions in the area of chief complaint ?Neurologic: Sensation intact distally ?Psychiatric: Patient is competent for consent with normal mood and affect ? ?MUSCULOSKELETAL:  ?LLE No traumatic wounds, ecchymosis, or rash ? Nontender ? groin pain with log roll ? No knee   or ankle effusion ? Sens DPN, SPN, TN intact ? Motor EHL, ext, flex 5/5 ? DP 2+, PT 2+, No significant edema ?RLE No traumatic wounds, ecchymosis, or rash ? Nontender ? No groin pain with log roll ? No knee or ankle effusion ? Sens DPN, SPN, TN intact ? Motor EHL, ext, flex 5/5 ? DP 2+, PT 2+, No significant edema ? ? ?IMAGING: X-rays of the pelvis left hip and left femur reviewed demonstrate a displaced left femoral neck fracture. ? ?Assessment: ?Principal Problem: ?  Closed left hip fracture (HCC) ?Active Problems: ?  HLD (hyperlipidemia) ?  Essential hypertension ?  Hypothyroidism ?  Anxiety ?  Fall at home, initial encounter ? ? ?Displaced left femoral neck fracture. ? ?Plan: ?Discussed with  the patient that unfortunately she is sustained a similar fracture to her left hip as the one she had with her right hip 5 months ago.  Given the displacement not amenable to ORIF.  Given the patient's baseline mobility, activity level, and age feel she is a good candidate for a cemented left hip hemiarthroplasty. ? ?The risks benefits and alternatives were discussed with the patient including but not limited to the risks of nonoperative treatment, versus surgical intervention including infection, bleeding, nerve injury, periprosthetic fracture, the need for revision surgery, dislocation, leg length discrepancy, blood clots, cardiopulmonary complications, morbidity, mortality, among others, and they were willing to proceed.    ? ?Plan for OR tomorrow pending OR availability, currently no availability until late afternoon/early evening. ?Patient to be n.p.o. after midnight in anticipation for surgery tomorrow. ?Plan for DVT chemoprophylaxis and early mobilization postoperatively. ? ? ? ?Joen Laura, MD ? ?Contact information:   ?Weekdays 7am-5pm epic message Dr. Blanchie Dessert, or call office for patient follow up: [phone] ?After hours and holidays please check [email or web address] for group call information for [facility]  ?

## 2022-02-28 NOTE — H&P (Signed)
?History and Physical  ? ? ?Patient: Tiffany Thornton DOB: February 11, 1933 ?DOA: 02/28/2022 ?DOS: the patient was seen and examined on 02/28/2022 ?PCP: Blair Heys, MD  ?Patient coming from: ALF/ILF ? ?Chief Complaint: Left hip pain ? ?HPI: Tiffany Thornton is a 88 y.o. female with medical history significant of hypothyroidism, HTN, HLD, anxiety, Hx of ICH. Presenting left hip pain after a fall. She reports that she was sitting in her chair this morning and attempted to move to her wheelchair. She reached for the [facility] w/ her right hand and it moved. This caused her to fall on her left side. She felt pain in her left hip with the fall. She did not hit her head. She didn't have any LOC. She was unable to get up on her own. So she called for EMS. She denies any other aggravating or alleviating factors.  ? ?Review of Systems: As mentioned in the history of present illness. All other systems reviewed and are negative. ?Past Medical History:  ?Diagnosis Date  ? Hyperlipidemia   ? Hypothyroid   ? Stroke [facility]   ? ?Past Surgical History:  ?Procedure Laterality Date  ? ABDOMINAL HYSTERECTOMY    ? APPENDECTOMY    ? CHOLECYSTECTOMY    ? CRANIOTOMY N/A 04/30/2018  ? Procedure: CRANIOTOMY HEMATOMA EVACUATION SUBDURAL;  Surgeon: Julio Sicks, MD;  Location: [facility] OR;  Service: Neurosurgery;  Laterality: N/A;  suboccipital craniectomy for hematoma  ? HIP ARTHROPLASTY Right 10/06/2021  ? Procedure: ARTHROPLASTY BIPOLAR HIP (HEMIARTHROPLASTY);  Surgeon: Joen Laura, MD;  Location: [facility] OR;  Service: Orthopedics;  Laterality: Right;  ? ?Social History:  reports that she has been smoking cigarettes. She started smoking about 43 years ago. She has never used smokeless tobacco. She reports that she does not drink alcohol and does not use drugs. ? ?Allergies  ?Allergen Reactions  ? Hydrochlorothiazide   ?  Hyponatremia  ? ? ?Family History  ?Problem Relation Age of Onset  ? Diabetes Mother   ? Lung cancer Father   ? Diabetes Sister    ? ? ?Prior to Admission medications   ?Medication Sig Start Date End Date Taking? Authorizing Provider  ?acetaminophen (TYLENOL) 500 MG tablet Take 2 tablets (1,000 mg total) by mouth every 8 (eight) hours as needed. 10/12/21   Meredeth Ide, MD  ?amLODipine (NORVASC) 5 MG tablet Take 1 tablet (5 mg total) by mouth daily. 10/29/21   Medina-Vargas, Monina C, NP  ?aspirin 81 MG tablet Take 1 tablet (81 mg total) by mouth daily. 06/19/18   Ihor Austin, NP  ?bisacodyl (DULCOLAX) 10 MG suppository Place 10 mg rectally as needed for moderate constipation.    [provider]  ?enoxaparin (LOVENOX) 40 MG/0.4ML injection Inject 0.4 mLs (40 mg total) into the skin daily for 25 days. 10/12/21 11/06/21  Meredeth Ide, MD  ?hydrALAZINE (APRESOLINE) 25 MG tablet Take 1 tablet (25 mg total) by mouth every 8 (eight) hours. 10/29/21   Medina-Vargas, Monina C, NP  ?levothyroxine (SYNTHROID) 75 MCG tablet Take 1 tablet (75 mcg total) by mouth daily before breakfast. 10/29/21   Medina-Vargas, Monina C, NP  ?Magnesium Hydroxide (MILK OF MAGNESIA PO) Take 30 mLs by mouth as needed.    [provider]  ?metoprolol tartrate (LOPRESSOR) 25 MG tablet Take 1 tablet (25 mg total) by mouth 2 (two) times daily. 10/29/21   Medina-Vargas, Margit Banda, NP  ?Multiple Vitamin (MULTIVITAMIN WITH MINERALS) TABS tablet Take 1 tablet by mouth daily.  [provider]  ?rosuvastatin (CRESTOR) 10 MG tablet Take 1 tablet (10 mg total) by mouth daily. 10/29/21   Medina-Vargas, Monina C, NP  ?senna-docusate (SENOKOT-S) 8.6-50 MG tablet Take 1 tablet by mouth 2 (two) times daily as needed for mild constipation. 10/12/21   Meredeth Ide, MD  ? ? ?Physical Exam: ?Vitals:  ? 02/28/22 1030 02/28/22 1100 02/28/22 1219 02/28/22 1300  ?BP: (!) 173/100 (!) 200/88 (!) 166/73 (!) 161/69  ?Pulse: 80 77 71 74  ?Resp: 20  (!) 22 19  ?Temp: 97.7 ?F (36.5 ?C)     ?TempSrc: Oral     ?SpO2: 98% 97% 94% 94%  ?Weight:      ?Height:      ? ?General: 88 y.o.  female resting in bed in NAD ?Eyes: PERRL, normal sclera ?ENMT: Nares patent w/o discharge, orophaynx clear, dentition normal, ears w/o discharge/lesions/ulcers ?Neck: Supple, trachea midline ?Cardiovascular: RRR, +S1, S2, no m/g/r, equal pulses throughout ?Respiratory: CTABL, no w/r/r, normal WOB ?GI: BS+, NDNT, no masses noted, no organomegaly noted ?MSK: No e/c/c; limited ROM left hip d/t pain ?Neuro: A&O x 3, no focal deficits ?Psyc: Appropriate interaction and affect, calm/cooperative ? ?Data Reviewed: ? ?Na+  137 ?Glucose 119 ?WBC  7.8 ? ?XR Left Hip: Impacted transcervical fracture of the left femoral neck. ?CXR: Hyperinflation of the lungs is noted. No other acute abnormality seen. Aortic Atherosclerosis (ICD10-I70.0). ?CT C-spine: No acute fracture or traumatic malalignment of the cervical spine. ?CTH: 1. No acute intracranial pathology. 2. Stable postsurgical changes reflecting suboccipital craniectomy with unchanged encephalomalacia and gliosis in the underlying right cerebellar hemisphere. ? ?EKG: sinus, no st elevations ? ?Assessment and Plan: ?No notes have been filed under this hospital service. ?Service: Hospitalist ?Left hip fracture ?Fall ?    - admit to inpt, med-surg ?    - Ortho consulted ([facility]); OR likely tomorrow ?    - can have diet now, make NPOpMN ?    - pain control ?    - PT/OT after surgery ?    - TOC consult ?    - Chales Abrahams MI risk is 0.5% 30-day mortality ? ?HTN ?    - resume home regimen when confirmed ? ?HLD ?    - resume home regimen when confirmed ? ?Hypothyroidism ?    - resume home regimen when confirmed ? ?Anxiety ?    - resume home regimen when confirmed ? ? Advance Care Planning:   Code Status: DNR ? ?Consults: Ortho ([facility]) ? ?Family Communication: None at bedside ? ?Severity of Illness: ?The appropriate patient status for this patient is INPATIENT. Inpatient status is judged to be reasonable and necessary in order to provide the required intensity of  service to ensure the patient's safety. The patient's presenting symptoms, physical exam findings, and initial radiographic and laboratory data in the context of their chronic comorbidities is felt to place them at high risk for further clinical deterioration. Furthermore, it is not anticipated that the patient will be medically stable for discharge from the hospital within 2 midnights of admission.  ? ?* I certify that at the point of admission it is my clinical judgment that the patient will require inpatient hospital care spanning beyond 2 midnights from the point of admission due to high intensity of service, high risk for further deterioration and high frequency of surveillance required.* ? ?Author: ?Teddy Spike, DO ?02/28/2022 1:28 PM ? ?For on call review [email or web address].  ?

## 2022-02-28 NOTE — Progress Notes (Signed)
Patient with left displaced femoral neck fracture indicated for left hip hemiarthroplasty. Plan for OR likely tomorrow pending medical clearance. Full consult note to follow. ?

## 2022-02-28 NOTE — ED Provider Notes (Signed)
?[facility] DEPT ?Provider Note ? ? ?CSN: [id number] ?Arrival date & time: 02/28/22  1018 ? ?  ? ?History ? ?No chief complaint on file. ? ? ?Tiffany Thornton is a 88 y.o. female. ? ?HPI ? ?  ? ?Pt comes in with cc of fall. ?Pt has history of right-sided hip fracture requiring surgery sometime last year.  Patient is not on any blood thinners. ? ?She indicates that she had a mechanical fall while at home.  She is having pain primarily over her left hip, is unable to get up and walk.  Denies any severe chest pain, headache, neck pain. ? ?Home Medications ?Prior to Admission medications   ?Medication Sig Start Date End Date Taking? Authorizing Provider  ?acetaminophen (TYLENOL) 500 MG tablet Take 2 tablets (1,000 mg total) by mouth every 8 (eight) hours as needed. 10/12/21   Oswald Hillock, MD  ?amLODipine (NORVASC) 5 MG tablet Take 1 tablet (5 mg total) by mouth daily. 10/29/21   Medina-Vargas, Monina C, NP  ?aspirin 81 MG tablet Take 1 tablet (81 mg total) by mouth daily. 06/19/18   Frann Rider, NP  ?bisacodyl (DULCOLAX) 10 MG suppository Place 10 mg rectally as needed for moderate constipation.    [provider]  ?enoxaparin (LOVENOX) 40 MG/0.4ML injection Inject 0.4 mLs (40 mg total) into the skin daily for 25 days. 10/12/21 11/06/21  Oswald Hillock, MD  ?hydrALAZINE (APRESOLINE) 25 MG tablet Take 1 tablet (25 mg total) by mouth every 8 (eight) hours. 10/29/21   Medina-Vargas, Monina C, NP  ?levothyroxine (SYNTHROID) 75 MCG tablet Take 1 tablet (75 mcg total) by mouth daily before breakfast. 10/29/21   Medina-Vargas, Monina C, NP  ?Magnesium Hydroxide (MILK OF MAGNESIA PO) Take 30 mLs by mouth as needed.    [provider]  ?metoprolol tartrate (LOPRESSOR) 25 MG tablet Take 1 tablet (25 mg total) by mouth 2 (two) times daily. 10/29/21   Medina-Vargas, Senaida Lange, NP  ?Multiple Vitamin (MULTIVITAMIN WITH MINERALS) TABS tablet Take 1 tablet by mouth daily.    [provider]  ?rosuvastatin (CRESTOR) 10 MG tablet Take 1 tablet (10 mg total) by mouth daily. 10/29/21   Medina-Vargas, Monina C, NP  ?senna-docusate (SENOKOT-S) 8.6-50 MG tablet Take 1 tablet by mouth 2 (two) times daily as needed for mild constipation. 10/12/21   Oswald Hillock, MD  ?   ? ?Allergies    ?Hydrochlorothiazide   ? ?Review of Systems   ?Review of Systems  ?All other systems reviewed and are negative. ? ?Physical Exam ?Updated Vital Signs ?BP (!) 166/73   Pulse 71   Temp 97.7 ?F (36.5 ?C) (Oral)   Resp (!) 22   Ht 5\' 6"  (1.676 m)   Wt 51 kg   SpO2 94%   BMI 18.15 kg/m?  ?Physical Exam ?Vitals and nursing note reviewed.  ?Constitutional:   ?   Appearance: She is well-developed.  ?HENT:  ?   Head: Atraumatic.  ?Cardiovascular:  ?   Rate and Rhythm: Normal rate.  ?Pulmonary:  ?   Effort: Pulmonary effort is normal.  ?Musculoskeletal:     ?   General: Tenderness present.  ?   Cervical back: Normal range of motion and neck supple.  ?   Comments: L hip tenderness, 2 + DP  ?Skin: ?   General: Skin is warm and dry.  ?Neurological:  ?   Mental Status: She is alert and oriented to person, place, and time.  ? ? ?  ED Results / Procedures / Treatments   ?Labs ?(all labs ordered are listed, but only abnormal results are displayed) ?Labs Reviewed  ?BASIC METABOLIC PANEL - Abnormal; Notable for the following components:  ?    Result Value  ? Glucose, Bld 119 (*)   ? All other components within normal limits  ?CBC WITH DIFFERENTIAL/PLATELET  ?PROTIME-INR  ?TYPE AND SCREEN  ?ABO/RH  ? ? ?EKG ?EKG Interpretation ? ?Date/Time:  Monday February 28 2022 11:15:14 EDT ?Ventricular Rate:  76 ?PR Interval:  206 ?QRS Duration: 110 ?QT Interval:  418 ?QTC Calculation: 470 ?R Axis:   42 ?Text Interpretation: Sinus rhythm No acute changes No significant change since last tracing Confirmed by Varney Biles [phone]) on 02/28/2022 11:54:32 AM ? ?Radiology ?CT Head Wo Contrast ? ?Result Date: 02/28/2022 ?CLINICAL DATA:  Fall left leg pain EXAM: CT  HEAD WITHOUT CONTRAST TECHNIQUE: Contiguous axial images were obtained from the base of the skull through the vertex without intravenous contrast. RADIATION DOSE REDUCTION: This exam was performed according to the departmental dose-optimization program which includes automated exposure control, adjustment of the mA and/or kV according to patient size and/or use of iterative reconstruction technique. COMPARISON:  CT head 10/06/2021 FINDINGS: Brain: There is no evidence of acute intracranial hemorrhage, extra-axial fluid collection, or acute infarct. Postsurgical changes reflecting suboccipital craniectomy are again seen. Encephalomalacia and gliosis in the right cerebellar hemisphere is unchanged. A small focus of calcification is also unchanged. Background parenchymal volume is stable. The ventricles are stable in size. Patchy and confluent hypodensity in the subcortical and periventricular white matter likely reflects sequela of chronic white matter microangiopathy, not significantly changed. There is no solid mass lesion. There is no mass effect or midline shift. Vascular: There is calcification of the bilateral cavernous ICAs and vertebral arteries. Skull: Normal. Negative for fracture or focal lesion. Sinuses/Orbits: The imaged paranasal sinuses are clear. Bilateral lens implants are in place. The globes and orbits are otherwise unremarkable. Other: None. IMPRESSION: 1. No acute intracranial pathology. 2. Stable postsurgical changes reflecting suboccipital craniectomy with unchanged encephalomalacia and gliosis in the underlying right cerebellar hemisphere. Electronically Signed   By: Valetta Mole M.D.   On: 02/28/2022 11:47  ? ?CT Cervical Spine Wo Contrast ? ?Result Date: 02/28/2022 ?CLINICAL DATA:  Fall EXAM: CT CERVICAL SPINE WITHOUT CONTRAST TECHNIQUE: Multidetector CT imaging of the cervical spine was performed without intravenous contrast. Multiplanar CT image reconstructions were also generated. RADIATION  DOSE REDUCTION: This exam was performed according to the departmental dose-optimization program which includes automated exposure control, adjustment of the mA and/or kV according to patient size and/or use of iterative reconstruction technique. COMPARISON:  Cervical spine CT 10/06/2021 FINDINGS: Alignment: There is grade 1 anterolisthesis of C4 on C5, unchanged and likely degenerative in nature. There is no jumped or perched facets or other evidence of traumatic malalignment. Skull base and vertebrae: Skull base alignment is maintained. Vertebral body heights are preserved. There is no evidence of acute fracture. There is no suspicious osseous lesion. Soft tissues and spinal canal: No prevertebral fluid or swelling. No visible canal hematoma. Disc levels: There is mild multilevel degenerative change throughout the cervical spine, most advanced at C5-C6, not significantly changed. There is no high-grade spinal canal stenosis. Upper chest: The imaged lung apices are clear. Other: None. IMPRESSION: No acute fracture or traumatic malalignment of the cervical spine. Electronically Signed   By: Valetta Mole M.D.   On: 02/28/2022 11:51  ? ?DG Chest Port 1 View ? ?Result Date:  02/28/2022 ?CLINICAL DATA:  Chest pain. EXAM: PORTABLE CHEST 1 VIEW COMPARISON:  October 06, 2021. FINDINGS: The heart size and mediastinal contours are within normal limits. Both lungs are clear. Hyperinflation of the lungs is noted. The visualized skeletal structures are unremarkable. IMPRESSION: Hyperinflation of the lungs is noted. No other acute abnormality seen. Aortic Atherosclerosis (ICD10-I70.0). Electronically Signed   By: Marijo Conception M.D.   On: 02/28/2022 12:05  ? ?DG Hip Unilat With Pelvis 2-3 Views Left ? ?Result Date: 02/28/2022 ?CLINICAL DATA:  Fall, hip pain EXAM: DG HIP (WITH OR WITHOUT PELVIS) 2-3V LEFT COMPARISON:  10/06/2021 right hip radiographs FINDINGS: Impacted, transcervical fracture of the left femoral neck. No additional  fracture is seen in the pelvis. Status post right hip arthroplasty. Osteopenia. Degenerative changes in the imaged lumbar spine and sacroiliac joints. IMPRESSION: Impacted transcervical fracture of the left

## 2022-02-28 NOTE — Consult Note (Signed)
ORTHOPAEDIC CONSULTATION ? ?REQUESTING PHYSICIAN: Margie Ege A, DO ? ?Chief Complaint: left hip fracture ? ?HPI: ?Tiffany Thornton is a 88 y.o. female who had a right hip hemiarthroplasty for femoral neck fracture done in November of last year.   She had done well after surgery.  Unfortunately earlier this morning she was getting up from a chair, and was reaching for a rollator, but given the baseline tremor, she reports that she lost her balance and fell.  After the fall she had significant pain in the left groin area and inability to weight-bear.  She was brought to the emergency room and found to have a left displaced femoral neck fracture.  She denies pain in the joints or extremities.  She denies any pain in the right hip.  She denies any distal numbness and tingling.  She is not on anticoagulation. ? ? ? ?Past Medical History:  ?Diagnosis Date  ? Hyperlipidemia   ? Hypothyroid   ? Stroke [facility])   ? ?Past Surgical History:  ?Procedure Laterality Date  ? ABDOMINAL HYSTERECTOMY    ? APPENDECTOMY    ? CHOLECYSTECTOMY    ? CRANIOTOMY N/A 04/30/2018  ? Procedure: CRANIOTOMY HEMATOMA EVACUATION SUBDURAL;  Surgeon: Julio Sicks, MD;  Location: [facility] OR;  Service: Neurosurgery;  Laterality: N/A;  suboccipital craniectomy for hematoma  ? HIP ARTHROPLASTY Right 10/06/2021  ? Procedure: ARTHROPLASTY BIPOLAR HIP (HEMIARTHROPLASTY);  Surgeon: Joen Laura, MD;  Location: [facility] OR;  Service: Orthopedics;  Laterality: Right;  ? ?Social History  ? ?Socioeconomic History  ? Marital status: Widowed  ?  Spouse name: Not on file  ? Number of children: Not on file  ? Years of education: Not on file  ? Highest education level: Not on file  ?Occupational History  ? Not on file  ?Tobacco Use  ? Smoking status: Some Days  ?  Packs/day: 0.00  ?  Years: 40.00  ?  Pack years: 0.00  ?  Types: Cigarettes  ?  Start date: 05/07/1978  ? Smokeless tobacco: Never  ? Tobacco comments:  ?  1 to 2 every other day  ?Vaping Use  ? Vaping Use: Never used   ?Substance and Sexual Activity  ? Alcohol use: Never  ? Drug use: Never  ? Sexual activity: Not Currently  ?  Partners: Male  ?Other Topics Concern  ? Not on file  ?Social History Narrative  ? Pt lives alone   ? ?Social Determinants of Health  ? ?Financial Resource Strain: Not on file  ?Food Insecurity: Not on file  ?Transportation Needs: Not on file  ?Physical Activity: Not on file  ?Stress: Not on file  ?Social Connections: Not on file  ? ?Family History  ?Problem Relation Age of Onset  ? Diabetes Mother   ? Lung cancer Father   ? Diabetes Sister   ? ?Allergies  ?Allergen Reactions  ? Hydrochlorothiazide Other (See Comments)  ?  Hyponatremia  ? Latex Itching  ? ? ? ?Positive ROS: All other systems have been reviewed and were otherwise negative with the exception of those mentioned in the HPI and as above. ? ?Physical Exam: ?General: Alert, no acute distress ?Cardiovascular: No pedal edema ?Respiratory: No cyanosis, no use of accessory musculature ?Skin: No lesions in the area of chief complaint ?Neurologic: Sensation intact distally ?Psychiatric: Patient is competent for consent with normal mood and affect ? ?MUSCULOSKELETAL:  ?LLE No traumatic wounds, ecchymosis, or rash ? Nontender ? groin pain with log roll ? No knee  or ankle effusion ? Sens DPN, SPN, TN intact ? Motor EHL, ext, flex 5/5 ? DP 2+, PT 2+, No significant edema ?RLE No traumatic wounds, ecchymosis, or rash ? Nontender ? No groin pain with log roll ? No knee or ankle effusion ? Sens DPN, SPN, TN intact ? Motor EHL, ext, flex 5/5 ? DP 2+, PT 2+, No significant edema ? ? ?IMAGING: X-rays of the pelvis left hip and left femur reviewed demonstrate a displaced left femoral neck fracture. ? ?Assessment: ?Principal Problem: ?  Closed left hip fracture (HCC) ?Active Problems: ?  HLD (hyperlipidemia) ?  Essential hypertension ?  Hypothyroidism ?  Anxiety ?  Fall at home, initial encounter ? ? ?Displaced left femoral neck fracture. ? ?Plan: ?Discussed with  the patient that unfortunately she is sustained a similar fracture to her left hip as the one she had with her right hip 5 months ago.  Given the displacement not amenable to ORIF.  Given the patient's baseline mobility, activity level, and age feel she is a good candidate for a cemented left hip hemiarthroplasty. ? ?The risks benefits and alternatives were discussed with the patient including but not limited to the risks of nonoperative treatment, versus surgical intervention including infection, bleeding, nerve injury, periprosthetic fracture, the need for revision surgery, dislocation, leg length discrepancy, blood clots, cardiopulmonary complications, morbidity, mortality, among others, and they were willing to proceed.    ? ?Plan for OR tomorrow pending OR availability, currently no availability until late afternoon/early evening. ?Patient to be n.p.o. after midnight in anticipation for surgery tomorrow. ?Plan for DVT chemoprophylaxis and early mobilization postoperatively. ? ? ? ?Joen Laura, MD ? ?Contact information:   ?Weekdays 7am-5pm epic message Dr. Blanchie Dessert, or call office for patient follow up: [phone] ?After hours and holidays please check [email or web address] for group call information for [facility]  ?

## 2022-02-28 NOTE — Progress Notes (Signed)
DNR verified with pt, armband applied  ?

## 2022-02-28 NOTE — ED Triage Notes (Signed)
Pt BIB [facility] from [facility]. Patient states she was getting up from chair when she fell on her left side. Patient c/o left leg pain. Denies hitting head. No LOC. Patient not on blood thinners. Hx of right side hip replacement.  ?

## 2022-03-01 ENCOUNTER — Inpatient Hospital Stay (HOSPITAL_COMMUNITY): Payer: Medicare Other | Admitting: Certified Registered Nurse Anesthetist

## 2022-03-01 ENCOUNTER — Inpatient Hospital Stay (HOSPITAL_COMMUNITY): Payer: Medicare Other

## 2022-03-01 ENCOUNTER — Other Ambulatory Visit: Payer: Self-pay

## 2022-03-01 ENCOUNTER — Encounter (HOSPITAL_COMMUNITY)
Admission: EM | Disposition: A | Discharge status: Skilled Nursing Facility | Payer: Self-pay | Source: Home / Self Care | Attending: Internal Medicine

## 2022-03-01 ENCOUNTER — Encounter (HOSPITAL_COMMUNITY): Payer: Self-pay | Admitting: Internal Medicine

## 2022-03-01 DIAGNOSIS — S72002A Fracture of unspecified part of neck of left femur, initial encounter for closed fracture: Secondary | ICD-10-CM

## 2022-03-01 DIAGNOSIS — F419 Anxiety disorder, unspecified: Secondary | ICD-10-CM

## 2022-03-01 DIAGNOSIS — E43 Unspecified severe protein-calorie malnutrition: Secondary | ICD-10-CM | POA: Insufficient documentation

## 2022-03-01 DIAGNOSIS — I10 Essential (primary) hypertension: Secondary | ICD-10-CM

## 2022-03-01 DIAGNOSIS — E039 Hypothyroidism, unspecified: Secondary | ICD-10-CM

## 2022-03-01 HISTORY — PX: HIP ARTHROPLASTY: SHX981

## 2022-03-01 LAB — CBC
HCT: 39.8 % (ref 36.0–46.0)
Hemoglobin: 13.2 g/dL (ref 12.0–15.0)
MCH: 30.1 pg (ref 26.0–34.0)
MCHC: 33.2 g/dL (ref 30.0–36.0)
MCV: 90.9 fL (ref 80.0–100.0)
Platelets: 265 10*3/uL (ref 150–400)
RBC: 4.38 MIL/uL (ref 3.87–5.11)
RDW: 14 % (ref 11.5–15.5)
WBC: 9.6 10*3/uL (ref 4.0–10.5)
nRBC: 0 % (ref 0.0–0.2)

## 2022-03-01 LAB — BASIC METABOLIC PANEL
Anion gap: 6 (ref 5–15)
BUN: 14 mg/dL (ref 8–23)
CO2: 30 mmol/L (ref 22–32)
Calcium: 9.7 mg/dL (ref 8.9–10.3)
Chloride: 102 mmol/L (ref 98–111)
Creatinine, Ser: 0.83 mg/dL (ref 0.44–1.00)
GFR, Estimated: >60 mL/min (ref >60)
Glucose, Bld: 128 mg/dL — ABNORMAL HIGH (ref 70–99)
Potassium: 4.3 mmol/L (ref 3.5–5.1)
Sodium: 138 mmol/L (ref 135–145)

## 2022-03-01 LAB — SURGICAL PCR SCREEN
MRSA, PCR: NEGATIVE
Staphylococcus aureus: NEGATIVE

## 2022-03-01 SURGERY — HEMIARTHROPLASTY, HIP, DIRECT ANTERIOR APPROACH, FOR FRACTURE
Anesthesia: Spinal | Site: Hip | Laterality: Left

## 2022-03-01 MED ORDER — DEXAMETHASONE SODIUM PHOSPHATE 10 MG/ML IJ SOLN
INTRAMUSCULAR | Status: DC | PRN
  Administered 2022-03-01: 4 mg via INTRAVENOUS

## 2022-03-01 MED ORDER — CEFAZOLIN SODIUM 1 G IJ SOLR
INTRAMUSCULAR | Status: AC
  Filled 2022-03-01: qty 20

## 2022-03-01 MED ORDER — EPHEDRINE 5 MG/ML INJ
INTRAVENOUS | Status: AC
  Filled 2022-03-01: qty 5

## 2022-03-01 MED ORDER — CHLORHEXIDINE GLUCONATE CLOTH 2 % EX PADS
6.0000 | MEDICATED_PAD | Freq: Every day | CUTANEOUS | Status: DC
  Administered 2022-03-02 – 2022-03-03 (×2): 6 via TOPICAL

## 2022-03-01 MED ORDER — ACETAMINOPHEN 500 MG PO TABS
1000.0000 mg | ORAL_TABLET | Freq: Once | ORAL | Status: DC
  Filled 2022-03-01: qty 2

## 2022-03-01 MED ORDER — CEFAZOLIN SODIUM-DEXTROSE 2-4 GM/100ML-% IV SOLN: 2.0000 g | INTRAVENOUS | Status: DC

## 2022-03-01 MED ORDER — PHENYLEPHRINE HCL-NACL 20-0.9 MG/250ML-% IV SOLN
INTRAVENOUS | Status: DC | PRN
  Administered 2022-03-01: 40 ug/min via INTRAVENOUS

## 2022-03-01 MED ORDER — BUPIVACAINE HCL (PF) 0.5 % IJ SOLN
INTRAMUSCULAR | Status: DC | PRN
  Administered 2022-03-01: 20 mL

## 2022-03-01 MED ORDER — CHLORHEXIDINE GLUCONATE 4 % EX LIQD: 60.0000 mL | Freq: Once | CUTANEOUS | Status: DC

## 2022-03-01 MED ORDER — TRANEXAMIC ACID-NACL 1000-0.7 MG/100ML-% IV SOLN: 1000.0000 mg | INTRAVENOUS | Status: DC

## 2022-03-01 MED ORDER — FENTANYL CITRATE (PF) 100 MCG/2ML IJ SOLN: 25.0000 ug | INTRAMUSCULAR | Status: DC | PRN

## 2022-03-01 MED ORDER — PROPOFOL 10 MG/ML IV BOLUS
INTRAVENOUS | Status: AC
  Filled 2022-03-01: qty 20

## 2022-03-01 MED ORDER — EPHEDRINE SULFATE-NACL 50-0.9 MG/10ML-% IV SOSY
PREFILLED_SYRINGE | INTRAVENOUS | Status: DC | PRN
  Administered 2022-03-01: 10 mg via INTRAVENOUS

## 2022-03-01 MED ORDER — POVIDONE-IODINE 10 % EX SWAB: 2.0000 "application " | Freq: Once | CUTANEOUS | Status: DC

## 2022-03-01 MED ORDER — LACTATED RINGERS IV SOLN: INTRAVENOUS | Status: DC | PRN

## 2022-03-01 MED ORDER — PROPOFOL 10 MG/ML IV BOLUS
INTRAVENOUS | Status: DC | PRN
  Administered 2022-03-01 (×5): 20 mg via INTRAVENOUS

## 2022-03-01 MED ORDER — BUPIVACAINE IN DEXTROSE 0.75-8.25 % IT SOLN
INTRATHECAL | Status: DC | PRN
  Administered 2022-03-01: 1.4 mL via INTRATHECAL

## 2022-03-01 MED ORDER — CHLORHEXIDINE GLUCONATE 0.12 % MT SOLN
OROMUCOSAL | Status: AC
Start: 2022-03-01 — End: 2022-03-02
  Filled 2022-03-01: qty 15

## 2022-03-01 MED ORDER — BUPIVACAINE HCL (PF) 0.5 % IJ SOLN
INTRAMUSCULAR | Status: AC
  Filled 2022-03-01: qty 30

## 2022-03-01 MED ORDER — PROPOFOL 500 MG/50ML IV EMUL
INTRAVENOUS | Status: DC | PRN
  Administered 2022-03-01: 50 ug/kg/min via INTRAVENOUS

## 2022-03-01 MED ORDER — CEFAZOLIN SODIUM-DEXTROSE 2-4 GM/100ML-% IV SOLN
2.0000 g | Freq: Three times a day (TID) | INTRAVENOUS | Status: AC
  Administered 2022-03-01 – 2022-03-02 (×2): 2 g via INTRAVENOUS
  Filled 2022-03-01 (×2): qty 100

## 2022-03-01 MED ORDER — VITAMIN D 25 MCG (1000 UNIT) PO TABS
1000.0000 [IU] | ORAL_TABLET | Freq: Every day | ORAL | Status: DC
  Administered 2022-03-02 – 2022-03-03 (×2): 1000 [IU] via ORAL
  Filled 2022-03-01 (×3): qty 1

## 2022-03-01 MED ORDER — 0.9 % SODIUM CHLORIDE (POUR BTL) OPTIME
TOPICAL | Status: DC | PRN
  Administered 2022-03-01: 1000 mL

## 2022-03-01 MED ORDER — ENOXAPARIN SODIUM 40 MG/0.4ML IJ SOSY
40.0000 mg | PREFILLED_SYRINGE | INTRAMUSCULAR | Status: DC
Start: 2022-03-02 — End: 2022-03-03
  Administered 2022-03-02 – 2022-03-03 (×2): 40 mg via SUBCUTANEOUS
  Filled 2022-03-01 (×2): qty 0.4

## 2022-03-01 MED ORDER — CEFAZOLIN SODIUM-DEXTROSE 2-3 GM-%(50ML) IV SOLR
INTRAVENOUS | Status: DC | PRN
  Administered 2022-03-01: 2 g via INTRAVENOUS

## 2022-03-01 MED ORDER — AMISULPRIDE (ANTIEMETIC) 5 MG/2ML IV SOLN: 10.0000 mg | Freq: Once | INTRAVENOUS | Status: DC | PRN

## 2022-03-01 MED ORDER — SODIUM CHLORIDE 0.9 % IR SOLN
Status: DC | PRN
  Administered 2022-03-01: 3000 mL

## 2022-03-01 MED ORDER — SODIUM CHLORIDE 0.9 % IV SOLN: INTRAVENOUS | Status: DC

## 2022-03-01 MED ORDER — ONDANSETRON HCL 4 MG/2ML IJ SOLN
INTRAMUSCULAR | Status: DC | PRN
  Administered 2022-03-01: 4 mg via INTRAVENOUS

## 2022-03-01 SURGICAL SUPPLY — 65 items
ADH SKN CLS APL DERMABOND .7 (GAUZE/BANDAGES/DRESSINGS) ×1
APL PRP STRL LF DISP 70% ISPRP (MISCELLANEOUS) ×1
BLADE SAGITTAL 25.0X1.27X90 (BLADE) ×2 IMPLANT
BRUSH FEMORAL CANAL (MISCELLANEOUS) ×1 IMPLANT
CEMENT BONE SIMPLEX SPEEDSET (Cement) ×2 IMPLANT
CEMENT SCULP F/BN BIOPREP DIS (MISCELLANEOUS) ×1 IMPLANT
CHLORAPREP W/TINT 26 (MISCELLANEOUS) ×3 IMPLANT
COVER SURGICAL LIGHT HANDLE (MISCELLANEOUS) ×2 IMPLANT
DERMABOND ADVANCED (GAUZE/BANDAGES/DRESSINGS) ×1
DERMABOND ADVANCED .7 DNX12 (GAUZE/BANDAGES/DRESSINGS) ×1 IMPLANT
DRAPE HALF SHEET 40X57 (DRAPES) ×2 IMPLANT
DRAPE HIP W/POCKET STRL (MISCELLANEOUS) ×2 IMPLANT
DRAPE IMP U-DRAPE 54X76 (DRAPES) ×2 IMPLANT
DRAPE INCISE IOBAN 66X45 STRL (DRAPES) ×2 IMPLANT
DRAPE INCISE IOBAN 85X60 (DRAPES) ×2 IMPLANT
DRAPE POUCH INSTRU U-SHP 10X18 (DRAPES) ×1 IMPLANT
DRAPE U-SHAPE 47X51 STRL (DRAPES) ×4 IMPLANT
DRESSING AQUACEL AG SP 3.5X10 (GAUZE/BANDAGES/DRESSINGS) IMPLANT
DRESSING MEPILEX FLEX 4X4 (GAUZE/BANDAGES/DRESSINGS) IMPLANT
DRSG AQUACEL AG ADV 3.5X 6 (GAUZE/BANDAGES/DRESSINGS) IMPLANT
DRSG AQUACEL AG ADV 3.5X10 (GAUZE/BANDAGES/DRESSINGS) ×1 IMPLANT
DRSG AQUACEL AG SP 3.5X10 (GAUZE/BANDAGES/DRESSINGS)
DRSG MEPILEX FLEX 4X4 (GAUZE/BANDAGES/DRESSINGS) ×4
ELECT BLADE 4.0 EZ CLEAN MEGAD (MISCELLANEOUS) ×2
ELECT REM PT RETURN 15FT ADLT (MISCELLANEOUS) ×2 IMPLANT
ELECTRODE BLDE 4.0 EZ CLN MEGD (MISCELLANEOUS) ×1 IMPLANT
FEMORAL HEAD LFIT V40 28MM PL0 (Orthopedic Implant) ×1 IMPLANT
GLOVE SRG 8 PF TXTR STRL LF DI (GLOVE) ×1 IMPLANT
GLOVE SURG ORTHO LTX SZ8 (GLOVE) ×4 IMPLANT
GLOVE SURG UNDER POLY LF SZ8 (GLOVE) ×2
GOWN STRL REUS W/ TWL XL LVL3 (GOWN DISPOSABLE) ×1 IMPLANT
GOWN STRL REUS W/TWL XL LVL3 (GOWN DISPOSABLE) ×2
HANDPIECE INTERPULSE COAX TIP (DISPOSABLE) ×2
HEAD COMP BIPOLAR 28X45 (Head) ×1 IMPLANT
HOOD PEEL AWAY FLYTE STAYCOOL (MISCELLANEOUS) ×6 IMPLANT
IRRIGATION SURGIPHOR STRL (IV SOLUTION) IMPLANT
KIT BASIN OR (CUSTOM PROCEDURE TRAY) ×2 IMPLANT
KIT TURNOVER KIT A (KITS) ×2 IMPLANT
MANIFOLD NEPTUNE II (INSTRUMENTS) ×2 IMPLANT
MARKER SKIN DUAL TIP RULER LAB (MISCELLANEOUS) ×2 IMPLANT
NDL 18GX1X1/2 (RX/OR ONLY) (NEEDLE) IMPLANT
NEEDLE 18GX1X1/2 (RX/OR ONLY) (NEEDLE) IMPLANT
NS IRRIG 1000ML POUR BTL (IV SOLUTION) ×2 IMPLANT
PACK TOTAL JOINT (CUSTOM PROCEDURE TRAY) ×2 IMPLANT
RETRIEVER SUT HEWSON (MISCELLANEOUS) ×2 IMPLANT
SEALER BIPOLAR AQUA 6.0 (INSTRUMENTS) ×2 IMPLANT
SET HNDPC FAN SPRY TIP SCT (DISPOSABLE) IMPLANT
SET INTERPULSE LAVAGE W/TIP (ORTHOPEDIC DISPOSABLE SUPPLIES) ×2 IMPLANT
SPONGE T-LAP 18X18 ~~LOC~~+RFID (SPONGE) ×4 IMPLANT
STEM HIP ACCOLADE SZ5 37X145 (Stem) ×1 IMPLANT
STOCKINETTE IMPERVIOUS LG (DRAPES) ×2 IMPLANT
SUCTION FRAZIER HANDLE 10FR (MISCELLANEOUS)
SUCTION TUBE FRAZIER 10FR DISP (MISCELLANEOUS) IMPLANT
SUT ETHIBOND 2 V 37 (SUTURE) ×2 IMPLANT
SUT MNCRL AB 3-0 PS2 18 (SUTURE) ×2 IMPLANT
SUT STRATAFIX 1PDS 45CM VIOLET (SUTURE) ×4 IMPLANT
SUT VIC AB 0 CT1 27 (SUTURE) ×2
SUT VIC AB 0 CT1 27XBRD ANBCTR (SUTURE) ×1 IMPLANT
SUT VIC AB 2-0 CT2 27 (SUTURE) ×4 IMPLANT
SYR 20ML LL LF (SYRINGE) ×1 IMPLANT
TOWEL GREEN STERILE (TOWEL DISPOSABLE) ×2 IMPLANT
TOWER CARTRIDGE SMART MIX (DISPOSABLE) ×1 IMPLANT
TRAY FOLEY MTR SLVR 16FR STAT (SET/KITS/TRAYS/PACK) IMPLANT
TUBE SUCT ARGYLE STRL (TUBING) ×2 IMPLANT
WATER STERILE IRR 1000ML POUR (IV SOLUTION) ×1 IMPLANT

## 2022-03-01 NOTE — Op Note (Addendum)
03/01/2022 ? ?5:08 PM ? ?PATIENT:  Tiffany Thornton  ? ?MRN: [id number] ? ?PRE-OPERATIVE DIAGNOSIS: Left displaced femoral neck fracture ? ?POST-OPERATIVE DIAGNOSIS: Left displaced femoral neck fracture ? ?PROCEDURE:  Procedure(s): ?Left cemented bipolar hemiarthroplasty ? ?PREOPERATIVE INDICATIONS:  Tiffany Thornton is an 88 y.o. female who was admitted 02/28/2022 with a diagnosis of Closed left hip fracture [facility]) and elected for surgical management.  The risks benefits and alternatives were discussed with the patient including but not limited to the risks of nonoperative treatment, versus surgical intervention including infection, bleeding, nerve injury, periprosthetic fracture, the need for revision surgery, dislocation, leg length discrepancy, blood clots, cardiopulmonary complications, morbidity, mortality, among others, and they were willing to proceed.  Predicted outcome is good, although there will be at least a six to nine month expected recovery.  ? ?OPERATIVE REPORT  ?   ?SURGEON:  Charlies Constable, MD ?   ?ASSISTANT: Izola Price, RNFA (Present throughout the entire procedure,  necessary for completion of procedure in a timely manner, assisting with retraction, instrumentation, and closure)  ?   ?ANESTHESIA: Spinal ? ?ESTIMATED BLOOD LOSS: 150 cc ?   ?COMPLICATIONS:  None.  ? ?UNIQUE ASPECTS OF THE CASE: None ?   ? ?COMPONENTS:  ?[employer]ker Accolade C size 5, 28+0 cobalt chrome inner ball, 28/45 bipolar outer ball ? ?   ?PROCEDURE IN DETAIL: The patient was met in the holding area and identified.  The appropriate hip  was marked at the operative site. The patient was then transported to the OR and  placed under anesthesia.  At that point, the patient was  placed in the lateral decubitus position with the operative side up and  secured to the operating room table and all bony prominences padded. A subaxillary role was placed. ?   ?The operative lower extremity was prepped from the iliac crest to the ankle.  Sterile  draping was performed.  2g of ancef and 1g TXA were given prior to incision. Time out was performed prior to incision.   ?   ?A routine posterolateral approach was utilized via sharp dissection  carried down to the subcutaneous tissue.  Gross bleeders were Bovie  coagulated.  The iliotibial band was identified and incised  along the length of the skin incision.  A Charnley retractor was inserted with care to protect the sciatic nerve.  With the hip internally rotated, the short external rotators  were identified. The piriformis was tagged with #5 Ethibond, and the hip capsule released in a T-type fashion, and posterior sleeve of the capsule was also tagged. ? ?The femoral neck was exposed, and I resected the femoral neck using the appropriate jig. This was performed at approximately 42m above the lesser trochanter. ?   ?I then exposed the deep acetabulum, cleared out any tissue including the ligamentum teres. ?   ?I then prepared the proximal femur using the box cutter, Charnley awl, and then sequentially broached. ? ?A trial utilized, and I reduced the hip, leg lengths were assessed clinically and felt to be equal. The hip was then taken through a full range of motion, the hip was stable at full extension and 90 degrees external rotation without anterior subluxation. The hip was also stable in the position of sleep, and in neutral abduction up to 90 degrees flexion, and 90 degrees IR. The trial components were then removed.  ? ?We then prepared canal for cementation.  The cement restrictor was measured and inserted distally.  The canal was then irrigated  with the pulse lavage and 3 L of normal saline.  2 bags of Simplex cement were prepared.  Using the cement gun the cement was inserted distally and the canal was filled.  We then pressurized the canal. The real implant was then inserted matching the patient's native anteversion of approximately 30 degrees.  We then waited for 15 minutes for the cement to be fully  set.  Excess cement was removed.  A lap was placed in the acetabulum prior to cementing was also removed and the acetabulum was assessed to make sure there was no cement or bone fragments. ? ?The hip was then reduced with the trial head again and taken through functional range of motion and found to have excellent stability. Leg lengths were restored. The real head was then impacted onto the stem and the hip was again reduced. ? ?The capsule was then repaired with #2 Ethibond., and the piriformis was repaired to the abductor tendon.  His. Excellent posterior capsular repair was achieved.  ? ?I then irrigated the hip copiously again with pulse lavage. The wounds were injected with 20cc of 0.5% marcaine. The fascia and IT band as repaired with #1 stratafix, followed by 0 vicryl for the fat layer followed by 2-0 Vicryl and running 3-0 Monocryl for the skin, Dermabond was applied and an Aquacel dressing was placed.  The patient was then awakened and returned to PACU in stable and satisfactory condition. There were no complications. ? ?Post op recs: ?WB: WBAT with posterior hip precautions x6 weeks ?Abx: ancef x23 hours post op ?Imaging: PACU xrays ?Dressing: Aquacel dressing to be kept intact until follow-up ?DVT prophylaxis: lovenox starting POD1 x4 weeks ?Follow up: 2 weeks after surgery for a wound check with Dr. Zachery Dakins at [facility].  ?Address: [address] [place], [place], [place] [address]  ?Office Phone: [phone] ? ? ?Charlies Constable, MD ?Orthopedic Surgeon ? ?03/01/2022 5:08 PM ? ?  ?

## 2022-03-01 NOTE — Progress Notes (Signed)
Initial Nutrition Assessment ? ?DOCUMENTATION CODES:  ? ?Severe malnutrition in context of chronic illness, Underweight ? ?INTERVENTION:  ? ?-Recommend Regular diet once diet advanced, given severe malnutrition and advanced age. ? ?-Magic cups or Mighty shakes (depending on availability) on meal trays ? ?NUTRITION DIAGNOSIS:  ? ?Severe Malnutrition related to chronic illness (History of ICH due to aneurysm rupture) as evidenced by severe fat depletion, severe muscle depletion. ? ?GOAL:  ? ?Patient will meet greater than or equal to 90% of their needs ? ?MONITOR:  ? ?Diet advancement, Labs, Weight trends, I & O's ? ?REASON FOR ASSESSMENT:  ? ?Consult ?Hip fracture protocol ? ?ASSESSMENT:  ? ?88 y.o. female with PMH significant for HTN, HLD, hypothyroidism, anxiety, history of brain aneurysm leading to [place] s/p craniotomy with hematoma evacuation in 2019. Admitted for left hip fracture. ? ?Patient currently NPO, awaiting transfer to [facility] for hip surgery. Pt reports this is her second hip fracture requiring surgery. She was trying to grab ahold her rollator and did not grip it well enough and fell causing her fracture. Pt is unsteady since ICH in 2019.  ? ?Pt states she eats well with good appetite.  ?Given her age she doesn't eat as much but knows to eat protein foods and snacks on peanut butter. She mainly drinks juice and water. She is not interested in protein supplements. Once diet advanced, will add Magic cups or an alternative supplement based on availability.  ? ?Per weight records, no weight loss noted. ? ?Medications: Multivitamin with minerals daily ? ?Labs reviewed. ? ?NUTRITION - FOCUSED PHYSICAL EXAM: ? ?Flowsheet Row Most Recent Value  ?Orbital Region Severe depletion  ?Upper Arm Region Severe depletion  ?Thoracic and Lumbar Region Severe depletion  ?Temple Region Severe depletion  ?Clavicle Bone Region Severe depletion  ?Clavicle and Acromion Bone Region Severe depletion  ?Scapular Bone Region Severe  depletion  ?Dorsal Hand Severe depletion  ?Patellar Region Unable to assess  [did not want to be moved, having hip pain]  ?Anterior Thigh Region Unable to assess  ?Posterior Calf Region Unable to assess  ?Edema (RD Assessment) None  ?Hair Reviewed  ?Eyes Reviewed  ?Mouth Reviewed  [dentures]  ?Skin Reviewed  ? ?  ? ? ?Diet Order:   ?Diet Order   ? ?       ?  Diet NPO time specified Except for: [employer], Sips with Meds  Diet effective midnight       ?  ? ?  ?  ? ?  ? ? ?EDUCATION NEEDS:  ? ?Education needs have been addressed ? ?Skin:  Skin Assessment: Reviewed RN Assessment ? ?Last BM:  PTA ? ?Height:  ? ?Ht Readings from Last 1 Encounters:  ?02/28/22 5\' 6"  (1.676 m)  ? ? ?Weight:  ? ?Wt Readings from Last 1 Encounters:  ?02/28/22 51 kg  ? ? ?BMI:  Body mass index is 18.15 kg/m?. ? ?Estimated Nutritional Needs:  ? ?Kcal:  1500-1700 ? ?Protein:  75-85g ? ?Fluid:  1.7L/day ? ?Clayton Bibles, MS, RD, LDN ?Inpatient Clinical Dietitian ?Contact information available via Amion ? ?

## 2022-03-01 NOTE — Anesthesia Preprocedure Evaluation (Addendum)
Anesthesia Evaluation  ?Patient identified by MRN, date of birth, ID band ?Patient awake ? ? ? ?Reviewed: ?Allergy & Precautions, NPO status , Patient's Chart, lab work & pertinent test results ? ?History of Anesthesia Complications ?Negative for: history of anesthetic complications ? ?Airway ?Mallampati: I ? ?TM Distance: >3 FB ?Neck ROM: Full ? ? ? Dental ? ?(+) Edentulous Upper, Dental Advisory Given ?  ?Pulmonary ?Current Smoker,  ?  ?Pulmonary exam normal ? ? ? ? ? ? ? Cardiovascular ?hypertension, Pt. on medications and Pt. on home beta blockers ?Normal cardiovascular exam ? ? ?  ?Neuro/Psych ?PSYCHIATRIC DISORDERS Anxiety CVA   ? GI/Hepatic ?negative GI ROS, Neg liver ROS,   ?Endo/Other  ?Hypothyroidism  ? Renal/GU ?negative Renal ROS  ? ?  ?Musculoskeletal ? ? Abdominal ?  ?Peds ? Hematology ?negative hematology ROS ?(+)   ?Anesthesia Other Findings ? ? Reproductive/Obstetrics ? ?  ? ? ? ? ? ? ? ? ? ? ? ? ? ?  ?  ? ? ? ? ? ? ? ?Lab Results  ?Component Value Date  ? WBC 9.6 03/01/2022  ? HGB 13.2 03/01/2022  ? HCT 39.8 03/01/2022  ? MCV 90.9 03/01/2022  ? PLT 265 03/01/2022  ? ?Lab Results  ?Component Value Date  ? CREATININE 0.83 03/01/2022  ? BUN 14 03/01/2022  ? NA 138 03/01/2022  ? K 4.3 03/01/2022  ? CL 102 03/01/2022  ? CO2 30 03/01/2022  ? ? ?Anesthesia Physical ? ?Anesthesia Plan ? ?ASA: 3 ? ?Anesthesia Plan: Spinal  ? ?Post-op Pain Management: Tylenol PO (pre-op)*  ? ?Induction:  ? ?PONV Risk Score and Plan: 2 and Ondansetron, Treatment may vary due to age or medical condition and Propofol infusion ? ?Airway Management Planned: Simple Face Mask ? ?Additional Equipment: None ? ?Intra-op Plan:  ? ?Post-operative Plan:  ? ?Informed Consent: I have reviewed the patients History and Physical, chart, labs and discussed the procedure including the risks, benefits and alternatives for the proposed anesthesia with the patient or authorized representative who has indicated  his/her understanding and acceptance.  ? ?Patient has DNR.  ?Discussed DNR with patient and Suspend DNR. ?  ?Dental advisory given ? ?Plan Discussed with: Anesthesiologist ? ?Anesthesia Plan Comments:   ? ? ? ? ? ?Anesthesia Quick Evaluation ? ?

## 2022-03-01 NOTE — Progress Notes (Signed)
?PROGRESS NOTE ? ?Jamoni Broadfoot Culmer  ?DOB: 1933/02/19  ?PCP: Blair Heys, MD ?NWG:[id number]  ?DOA: 02/28/2022 ? LOS: 1 day  ?Hospital Day: 2 ? ?Brief narrative: ?JANNIS ATKINS is a 88 y.o. female with PMH significant for HTN, HLD, hypothyroidism, anxiety, history of brain aneurysm leading to ICH s/p craniotomy with hematoma evacuation in 2019. ?After her brain bleed in 2019, patient has some tremors and unsteadiness for which she uses a walker. ?4/3, while trying to reach her walker, she lost her balance and fell to the floor and started to hurt in her left hip.  She did not hit her head or pass out ?In the ED, patient was hemodynamically stable ?X-ray of the left hip showed impacted transcervical fracture of the left femoral neck ?Skeletal survey negative otherwise ? ?Admitted to hospital service ?Orthopedics consulted ? ?Subjective: ?Patient was seen and examined this morning.  Pleasant elderly Caucasian female.Marland Kitchen  No pain at rest. ?Son at bedside. ?Tentative plan of surgery this afternoon ? ?Principal Problem: ?  Closed left hip fracture (HCC) ?Active Problems: ?  HLD (hyperlipidemia) ?  Essential hypertension ?  Hypothyroidism ?  Anxiety ?  Fall at home, initial encounter ?  ? ? ?Assessment and Plan: ?Impacted transcervical fracture of the left femoral neck ?-Secondary to fall by losing her balance ?-Tentative plan of ORIF this afternoon ?-DVT prophylaxis and pain management per orthopedics ?-Patient tolerated right hip surgery November last year.  No active cardiac symptoms.  No history of CAD.  No history of blood clots. ?-Acceptable risk for contemplated surgery ? ?Essential hypertension ?-PTA metoprolol 25 mg twice daily, amlodipine 5 mg daily, hydralazine 25 mg 3 times daily, ?-Continue all.  Continue to monitor blood pressure ?  ?History of ICH due to aneurysm rupture  ?HLD ?-PTA aspirin 81 mg every 48 hours, Crestor 5 mg daily ?-Continue both. ?  ?Hypothyroidism ?-Continue Synthroid ?  ?Goals of care ?  Code  Status: DNR  ? ? ?Mobility: Encourage ambulation postsurgery ? ?Nutritional status:  ?Body mass index is 18.15 kg/m?.  ?  ?  ? ? ? ? ?Diet:  ?Diet Order   ? ?       ?  Diet NPO time specified Except for: [employer], Sips with Meds  Diet effective midnight       ?  ? ?  ?  ? ?  ? ? ?DVT prophylaxis:  ?SCDs Start: 02/28/22 1417 ?  ?Antimicrobials: None ?Fluid: None currently.  I will start on NS at 50 ml/hr perioperatively ?Consultants: Orthopedics ?Family Communication: Son at bedside ? ?Status is: Inpatient ? ?Continue in-hospital care because: Pending ORIF today ?Level of care: Telemetry Medical  ? ?Dispo: The patient is from: Home ?             Anticipated d/c is to: Home versus rehab.  Needs PT eval postprocedure ?             Patient currently is not medically stable to d/c. ?  Difficult to place patient No ? ? ? ? ?Infusions:  ? ? ?Scheduled Meds: ? amLODipine  5 mg Oral q morning  ? aspirin EC  81 mg Oral Q48H  ? hydrALAZINE  25 mg Oral Q8H  ? levothyroxine  100 mcg Oral QAC breakfast  ? metoprolol tartrate  25 mg Oral BID  ? multivitamin with minerals  1 tablet Oral Q1400  ? rosuvastatin  5 mg Oral QHS  ? ? ?PRN meds: ?acetaminophen, HYDROcodone-acetaminophen, morphine injection  ? ?  Antimicrobials: ?Anti-infectives (From admission, onward)  ? ? None  ? ?  ? ? ?Objective: ?Vitals:  ? 03/01/22 0504 03/01/22 0948  ?BP: (!) 147/56 (!) 166/81  ?Pulse: 71 97  ?Resp: 16 18  ?Temp: 98.3 ?F (36.8 ?C) 98.1 ?F (36.7 ?C)  ?SpO2: 96% 95%  ? ? ?Intake/Output Summary (Last 24 hours) at 03/01/2022 1046 ?Last data filed at 03/01/2022 1000 ?Gross per 24 hour  ?Intake 420 ml  ?Output 1800 ml  ?Net -1380 ml  ? ?Filed Weights  ? 02/28/22 1026  ?Weight: 51 kg  ? ?Weight change:  ?Body mass index is 18.15 kg/m?.  ? ?Physical Exam: ?General exam: Pleasant, elderly Caucasian female.  Not in physical distress at rest ?Skin: No rashes, lesions or ulcers. ?HEENT: Atraumatic, normocephalic, no obvious bleeding ?Lungs: Clear to auscultation  bilaterally ?CVS: Regular rate and rhythm, no murmur ?GI/Abd soft, nontender, nondistended, bowel sound present ?CNS: Alert, awake, oriented x3 ?Psychiatry: Mood appropriate ?Extremities: No pedal edema, no calf tenderness ? ?Data Review: I have personally reviewed the laboratory data and studies available. ? ?F/u labs ordered ?Unresulted Labs (From admission, onward)  ? ? None  ? ?  ? ? ?Signed, ?Lorin Glass, MD ?[facility] ?03/01/2022 ? ? ? ? ? ? ? ? ? ? ? ? ?

## 2022-03-01 NOTE — Interval H&P Note (Signed)
The patient has been re-examined, and the chart reviewed, and there have been no interval changes to the documented history and physical.   ? ?Plan for left hip cemented hemiarthroplasty ? ?The operative side was examined and the patient was confirmed to have. Sens DPN, SPN, TN intact, Motor EHL, ext, flex 5/5, and DP 2+, PT 2+, No significant edema. ? ? ?The risks, benefits, and alternatives have been discussed at length with patient, and the patient is willing to proceed.  Left hip marked. Consent has been signed. ? ?

## 2022-03-01 NOTE — Anesthesia Postprocedure Evaluation (Signed)
Anesthesia Post Note ? ?Patient: ANTRICE PAL ? ?Procedure(s) Performed: ARTHROPLASTY BIPOLAR HIP (HEMIARTHROPLASTY) (Left: Hip) ? ?  ? ?Patient location during evaluation: PACU ?Anesthesia Type: Spinal ?Level of consciousness: awake and alert ?Pain management: pain level controlled ?Vital Signs Assessment: post-procedure vital signs reviewed and stable ?Respiratory status: spontaneous breathing and respiratory function stable ?Cardiovascular status: blood pressure returned to baseline and stable ?Postop Assessment: spinal receding ?Anesthetic complications: no ? ? ?No notable events documented. ? ?Last Vitals:  ?Vitals:  ? 03/01/22 1800 03/01/22 1815  ?BP: 123/60 (!) 98/51  ?Pulse: 75 77  ?Resp: 14 17  ?Temp:  36.6 ?C  ?SpO2: 94% 96%  ?  ?Last Pain:  ?Vitals:  ? 03/01/22 1815  ?TempSrc:   ?PainSc: 0-No pain  ? ?Pain Goal: Patients Stated Pain Goal: 2 (03/01/22 4193) ? ?  ?  ?  ?  ?  ?  ?  ? ?Dacota Ruben DANIEL ? ? ? ? ?

## 2022-03-01 NOTE — Discharge Instructions (Addendum)
Diet: As you were doing prior to hospitalization  ? ?Shower:  May shower but keep the wounds dry, use an occlusive plastic wrap, NO SOAKING IN TUB.  If the bandage gets wet, change with a clean dry gauze.  If you have a splint on, leave the splint in place and keep the splint dry with a plastic bag. ? ?Dressing:  Keep your dressing on until follow up 2 weeks after surgery ? ? ?Activity:  Increase activity slowly as tolerated, but follow the weight bearing instructions below.  The rules on driving is that you can not be taking narcotics while you drive, and you must feel in control of the vehicle.   ? ?Weight Bearing:   Weight bearing as tolerated left lower extremity.  Posterior hip precautions x6 weeks ? ?Blood clot prevention (DVT Prophylaxis): After surgery you are at an increased risk for a blood clot. you were prescribed a blood thinner, lovenox 40mg , to be taken once daily for a total of 4 weeks from surgery to help reduce your risk of getting a blood clot. This will help prevent a blood clot. Signs of a pulmonary embolus (blood clot in the lungs) include sudden short of breath, feeling lightheaded or dizzy, chest pain with a deep breath, rapid pulse rapid breathing. Signs of a blood clot in your arms or legs include new unexplained swelling and cramping, warm, red or darkened skin around the painful area. Please call the office or 911 right away if these signs or symptoms develop. ?To prevent constipation: you may use a stool softener such as - ? ?Colace (over the counter) 100 mg by mouth twice a day  ?Drink plenty of fluids (prune juice may be helpful) and high fiber foods ?Miralax (over the counter) for constipation as needed.   ? ?Itching:  If you experience itching with your medications, try taking only a single pain pill, or even half a pain pill at a time.  You may take up to 10 pain pills per day, and you can also use benadryl over the counter for itching or also to help with sleep.  ? ?Precautions:   If you experience chest pain or shortness of breath - call 911 immediately for transfer to the hospital emergency department!! ? ? ?Call office [phone]) for the following: ?Temperature greater than 101F ?Persistent nausea and vomiting ?Severe uncontrolled pain ?Redness, tenderness, or signs of infection (pain, swelling, redness, odor or green/yellow discharge around the site) ?Difficulty breathing, headache or visual disturbances ?Hives ?Persistent dizziness or light-headedness ?Extreme fatigue ?Any other questions or concerns you may have after discharge ? ?In an emergency, call 911 or go to an Emergency Department at a nearby hospital ? ?Follow- Up Appointment:  Please call for an appointment to be seen approximately 2-3 week after surgery in [facility] with your surgeon [facility] - [phone] ?Address: [address], [place], [place] ? ? ?  ?

## 2022-03-01 NOTE — Transfer of Care (Signed)
Immediate Anesthesia Transfer of Care Note ? ?Patient: Tiffany Thornton ? ?Procedure(s) Performed: ARTHROPLASTY BIPOLAR HIP (HEMIARTHROPLASTY) (Left: Hip) ? ?Patient Location: PACU ? ?Anesthesia Type:Spinal ? ?Level of Consciousness: drowsy and patient cooperative ? ?Airway & Oxygen Therapy: Patient Spontanous Breathing ? ?Post-op Assessment: Report given to RN and Post -op Vital signs reviewed and stable ? ?Post vital signs: Reviewed and stable ? ?Last Vitals:  ?Vitals Value Taken Time  ?BP 122/62 03/01/22 1744  ?Temp    ?Pulse 87 03/01/22 1744  ?Resp 15 03/01/22 1745  ?SpO2 98 % 03/01/22 1744  ?Vitals shown include unvalidated device data. ? ?Last Pain:  ?Vitals:  ? 03/01/22 1320  ?TempSrc: Oral  ?PainSc:   ?   ? ?Patients Stated Pain Goal: 2 (03/01/22 9798) ? ?Complications: No notable events documented. ?

## 2022-03-01 NOTE — Anesthesia Procedure Notes (Signed)
Spinal ? ?Patient location during procedure: OR ?Start time: 03/01/2022 3:32 PM ?End time: 03/01/2022 3:37 PM ?Reason for block: surgical anesthesia ?Staffing ?Performed: anesthesiologist  ?Anesthesiologist: Beryle Lathe, MD ?Preanesthetic Checklist ?Completed: patient identified, IV checked, risks and benefits discussed, surgical consent, monitors and equipment checked, pre-op evaluation and timeout performed ?Spinal Block ?Patient position: left lateral decubitus ?Prep: DuraPrep ?Patient monitoring: heart rate, cardiac monitor, continuous pulse ox and blood pressure ?Approach: midline ?Location: L2-3 ?Injection technique: single-shot ?Needle ?Needle type: Quincke  ?Needle gauge: 22 G ?Additional Notes ?Consent was obtained prior to the procedure with all questions answered and concerns addressed. Risks including, but not limited to, bleeding, infection, nerve damage, paralysis, failed block, inadequate analgesia, allergic reaction, high spinal, itching, and headache were discussed and the patient wished to proceed. Functioning IV was confirmed and monitors were applied. Sterile prep and drape, including hand hygiene, mask, and sterile gloves were used. The patient was positioned and the spine was prepped. The skin was anesthetized with lidocaine. Free flow of clear CSF was obtained prior to injecting local anesthetic into the CSF. The spinal needle aspirated freely following injection. The needle was carefully withdrawn. The patient was kept in left lateral decubitus for 3 minutes after the injection to encourage unilateral density on the operative side.The patient tolerated the procedure well.  ? ?Leslye Peer, MD ? ? ? ?

## 2022-03-01 NOTE — Progress Notes (Signed)
Report given to Geoffery Spruce, RN at [facility] short stay. I gathered pt belongings. Pt HCPOA signed consents. Updated [facility] SS of [facility] arrival and loss of PIV access and unsuccessful PIV placement. ?

## 2022-03-02 LAB — BASIC METABOLIC PANEL
Anion gap: 8 (ref 5–15)
BUN: 19 mg/dL (ref 8–23)
CO2: 25 mmol/L (ref 22–32)
Calcium: 8.9 mg/dL (ref 8.9–10.3)
Chloride: 100 mmol/L (ref 98–111)
Creatinine, Ser: 0.98 mg/dL (ref 0.44–1.00)
GFR, Estimated: 56 mL/min — ABNORMAL LOW (ref >60)
Glucose, Bld: 143 mg/dL — ABNORMAL HIGH (ref 70–99)
Potassium: 3.9 mmol/L (ref 3.5–5.1)
Sodium: 133 mmol/L — ABNORMAL LOW (ref 135–145)

## 2022-03-02 LAB — CBC WITH DIFFERENTIAL/PLATELET
Abs Immature Granulocytes: 0.04 10*3/uL (ref 0.00–0.07)
Basophils Absolute: 0 10*3/uL (ref 0.0–0.1)
Basophils Relative: 0 %
Eosinophils Absolute: 0 10*3/uL (ref 0.0–0.5)
Eosinophils Relative: 0 %
HCT: 33.8 % — ABNORMAL LOW (ref 36.0–46.0)
Hemoglobin: 11.6 g/dL — ABNORMAL LOW (ref 12.0–15.0)
Immature Granulocytes: 0 %
Lymphocytes Relative: 7 %
Lymphs Abs: 0.7 10*3/uL (ref 0.7–4.0)
MCH: 30.7 pg (ref 26.0–34.0)
MCHC: 34.3 g/dL (ref 30.0–36.0)
MCV: 89.4 fL (ref 80.0–100.0)
Monocytes Absolute: 1.6 10*3/uL — ABNORMAL HIGH (ref 0.1–1.0)
Monocytes Relative: 16 %
Neutro Abs: 8 10*3/uL — ABNORMAL HIGH (ref 1.7–7.7)
Neutrophils Relative %: 77 %
Platelets: 230 10*3/uL (ref 150–400)
RBC: 3.78 MIL/uL — ABNORMAL LOW (ref 3.87–5.11)
RDW: 13.9 % (ref 11.5–15.5)
WBC: 10.3 10*3/uL (ref 4.0–10.5)
nRBC: 0 % (ref 0.0–0.2)

## 2022-03-02 MED ORDER — DOCUSATE SODIUM 100 MG PO CAPS
100.0000 mg | ORAL_CAPSULE | Freq: Once | ORAL | Status: AC | PRN
Start: 2022-03-02 — End: 2022-03-03
  Administered 2022-03-03: 100 mg via ORAL
  Filled 2022-03-02: qty 1

## 2022-03-02 NOTE — Evaluation (Signed)
Physical Therapy Evaluation ?Patient Details ?Name: Tiffany Thornton ?MRN: [id number] ?DOB: 1933/04/04 ?Today's Date: 03/02/2022 ? ?History of Present Illness ? Pt is an 88 y/o female who presents from ALF s/p mechanical fall, sustaining a L femoral neck fracture. She is now s/p L hip hemiarthroplasty, posterior precautions, and is WBAT. 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 ?  ?Clinical Impression ? This patient presents with acute pain and decreased functional independence following the above mentioned procedure. At the time of PT eval, pt was able to perform transfers with up to +1 max assist. Increased time and effort required for all aspects of functional mobility at this time. This patient is appropriate for skilled PT interventions to address functional limitations, improve safety and independence with functional mobility, and return to PLOF.    ? ?Of note, HR did not exceed 110 bpm during mobility, and BP 90's/40's at end of session. Pt reports mild lighteheadedness and nausea. RN present at end of session as well.  ?   ? ?Recommendations for follow up therapy are one component of a multi-disciplinary discharge planning process, led by the attending physician.  Recommendations may be updated based on patient status, additional functional criteria and insurance authorization. ? ?Follow Up Recommendations Skilled nursing-short term rehab (<3 hours/day) ? ?  ?Assistance Recommended at Discharge Frequent or constant Supervision/Assistance  ?Patient can return home with the following ? Two people to help with walking and/or transfers;A lot of help with bathing/dressing/bathroom;Assistance with cooking/housework;Assist for transportation ? ?  ?Equipment Recommendations Rolling walker (2 wheels)  ?Recommendations for Other Services ?    ?  ?Functional Status Assessment Patient has had a recent decline in their  functional status and demonstrates the ability to make significant improvements in function in a reasonable and predictable amount of time.  ? ?  ?Precautions / Restrictions Precautions ?Precautions: Fall;Posterior Hip ?Precaution Booklet Issued: Yes (comment) ?Precaution Comments: Reviewed posterior hip precautions sheet with pt and reinforced at end of session however pt will need review. ?Restrictions ?Weight Bearing Restrictions: Yes ?LLE Weight Bearing: Weight bearing as tolerated  ? ?  ? ?Mobility ? Bed Mobility ?Overal bed mobility: Needs Assistance ?Bed Mobility: Supine to Sit, Sit to Supine ?  ?  ?Supine to sit: Max assist ?Sit to supine: Max assist ?  ?General bed mobility comments: Significantly increased time and effort to transition to EOB. Bed pad utilized to scoot around to EOB and position at end of session. ?  ? ?Transfers ?Overall transfer level: Needs assistance ?Equipment used: Rolling walker (2 wheels) ?Transfers: Sit to/from Stand, Bed to chair/wheelchair/BSC ?Sit to Stand: Mod assist ?Stand pivot transfers: Max assist ?  ?  ?  ?  ?General transfer comment: Mod-max assist for transition to standing and for separate transfer bed<>BSC. Pt with difficulty bracing herself due to R intentional tremor of RUE. Noted B IR and ADD of hips with standing and attempts at taking steps ?  ? ?Ambulation/Gait ?  ?  ?  ?  ?  ?  ?  ?General Gait Details: Grossly unable. Pt took 1 sidestep at EOB but unable to continue due to pain and weakness. ? ?Stairs ?  ?  ?  ?  ?  ? ?Wheelchair Mobility ?  ? ?Modified Rankin (Stroke Patients Only) ?  ? ?  ? ?Balance Overall balance assessment: Needs assistance ?Sitting-balance support: Feet supported, No upper extremity supported ?Sitting balance-Leahy Scale: Fair ?  ?  ?  Standing balance support: Bilateral upper extremity supported, During functional activity ?Standing balance-Leahy Scale: Zero ?Standing balance comment: Max assist required. ?  ?  ?  ?  ?  ?  ?  ?  ?  ?  ?   ?   ? ? ? ?Pertinent Vitals/Pain Pain Assessment ?Pain Assessment: Faces ?Faces Pain Scale: Hurts even more ?Pain Location: L hip, headache ?Pain Descriptors / Indicators: Operative site guarding, Sore, Aching, Headache ?Pain Intervention(s): Limited activity within patient's tolerance, Monitored during session, Repositioned  ? ? ?Home Living Family/patient expects to be discharged to:: Assisted living ?Living Arrangements: Alone ?  ?  ?  ?  ?  ?  ?  ?Home Equipment: Rollator (4 wheels) ?   ?  ?Prior Function Prior Level of Function : Independent/Modified Independent ?  ?  ?  ?  ?  ?  ?Mobility Comments: uses rollator at baseline ?ADLs Comments: Pt reports independence with ADL's ?  ? ? ?Hand Dominance  ? Dominant Hand: Right ? ?  ?Extremity/Trunk Assessment  ? Upper Extremity Assessment ?Upper Extremity Assessment: RUE deficits/detail ?RUE Deficits / Details: Significant intentional tremor ?  ? ?Lower Extremity Assessment ?Lower Extremity Assessment: LLE deficits/detail ?LLE Deficits / Details: Acute pain, decreased strength and AROM consistent with above mentioned injury and surgery ?  ? ?Cervical / Trunk Assessment ?Cervical / Trunk Assessment: Kyphotic  ?Communication  ? Communication: No difficulties  ?Cognition Arousal/Alertness: Awake/alert ?Behavior During Therapy: [facility] for tasks assessed/performed ?Overall Cognitive Status: Within Functional Limits for tasks assessed ?  ?  ?  ?  ?  ?  ?  ?  ?  ?  ?  ?  ?  ?  ?  ?  ?  ?  ?  ? ?  ?General Comments   ? ?  ?Exercises General Exercises - Lower Extremity ?Ankle Circles/Pumps: 10 reps ?Quad Sets: 10 reps  ? ?Assessment/Plan  ?  ?PT Assessment Patient needs continued PT services  ?PT Problem List Decreased strength;Decreased activity tolerance;Decreased range of motion;Decreased balance;Decreased mobility;Decreased knowledge of use of DME;Decreased safety awareness;Decreased knowledge of precautions;Pain ? ?   ?  ?PT Treatment Interventions DME instruction;Gait  training;Functional mobility training;Therapeutic activities;Therapeutic exercise;Balance training;Patient/family education   ? ?PT Goals (Current goals can be found in the Care Plan section)  ?Acute Rehab PT Goals ?Patient Stated Goal: Get back to her assisted living ?PT Goal Formulation: With patient ?Time For Goal Achievement: 03/16/22 ?Potential to Achieve Goals: Good ? ?  ?Frequency Min 3X/week ?  ? ? ?Co-evaluation   ?  ?  ?  ?  ? ? ?  ?AM-PAC PT "6 Clicks" Mobility  ?Outcome Measure Help needed turning from your back to your side while in a flat bed without using bedrails?: A Lot ?Help needed moving from lying on your back to sitting on the side of a flat bed without using bedrails?: A Lot ?Help needed moving to and from a bed to a chair (including a wheelchair)?: A Lot ?Help needed standing up from a chair using your arms (e.g., wheelchair or bedside chair)?: A Lot ?Help needed to walk in hospital room?: Total ?Help needed climbing 3-5 steps with a railing? : Total ?6 Click Score: 10 ? ?  ?End of Session Equipment Utilized During Treatment: Gait belt ?Activity Tolerance: Patient limited by pain;Patient limited by fatigue ?Patient left: in bed;with call bell/phone within reach;with nursing/sitter in room ?Nurse Communication: Mobility status ?PT Visit Diagnosis: Unsteadiness on feet (R26.81);Pain;History of falling (Z91.81) ?Pain -  Right/Left: Left ?Pain - part of body: Hip ?  ? ?Time: [id number] ?PT Time Calculation (min) (ACUTE ONLY): 47 min ? ? ?Charges:   PT Evaluation ?$PT Eval Moderate Complexity: 1 Mod ?PT Treatments ?$Gait Training: 23-37 mins ?  ?   ? ? ?Conni SlipperLaura Giulia Hickey, PT, DPT ?Acute Rehabilitation Services ?Secure Chat Preferred ?Office: [phone]  ? ?Marylynn PearsonLaura D Jan Walters ?03/02/2022, 3:26 PM ? ?

## 2022-03-02 NOTE — Plan of Care (Signed)

## 2022-03-02 NOTE — Progress Notes (Signed)
? ? ? ?  Subjective: ?Patient reports no pain this morning. Doing very well since surgery yesterday.  She is eager to get up and work with physical therapy.  She denies any distal numbness and tingling. ? ?Objective:  ? ?VITALS:   ?Vitals:  ? 03/01/22 2030 03/02/22 0336 03/02/22 0430 03/02/22 0742  ?BP: 115/60 115/65 (!) 164/86 129/64  ?Pulse: 91 (!) 106 98 100  ?Resp:  16  15  ?Temp: 97.6 ?F (36.4 ?C) (!) 100.4 ?F (38 ?C)  98.7 ?F (37.1 ?C)  ?TempSrc: Oral Oral  Oral  ?SpO2: 95% 92% 95% 96%  ?Weight:      ?Height:      ? ? ?Sensation intact distally ?Intact pulses distally ?Dorsiflexion/Plantar flexion intact ?Incision: dressing C/D/I ?No cellulitis present ?Compartment soft ? ? ?Lab Results  ?Component Value Date  ? WBC 10.3 03/02/2022  ? HGB 11.6 (L) 03/02/2022  ? HCT 33.8 (L) 03/02/2022  ? MCV 89.4 03/02/2022  ? PLT 230 03/02/2022  ? ?BMET ?   ?Component Value Date/Time  ? NA 133 (L) 03/02/2022 0315  ? K 3.9 03/02/2022 0315  ? CL 100 03/02/2022 0315  ? CO2 25 03/02/2022 0315  ? GLUCOSE 143 (H) 03/02/2022 0315  ? BUN 19 03/02/2022 0315  ? CREATININE 0.98 03/02/2022 0315  ? CALCIUM 8.9 03/02/2022 0315  ? GFRNONAA 56 (L) 03/02/2022 0315  ? ? ?Xray: post op xrays show cemented left hip hemi in good position without adverse features ? ?Assessment/Plan: ?1 Day Post-Op  ? ?Principal Problem: ?  Closed left hip fracture (HCC) ?Active Problems: ?  HLD (hyperlipidemia) ?  Essential hypertension ?  Hypothyroidism ?  Anxiety ?  Fall at home, initial encounter ?  Protein-calorie malnutrition, severe ? ?Left hip hemiarthroplasty for femoral neck fracture 4/5 ? ?Post op recs: ?WB: WBAT with posterior hip precautions x6 weeks ?Abx: ancef x23 hours post op ?Imaging: PACU xrays ?Dressing: Aquacel dressing to be kept intact until follow-up ?DVT prophylaxis: lovenox starting POD1 x4 weeks ?Follow up: 2 weeks after surgery for a wound check with Tiffany Thornton at [facility].  ?Address: [address],  [place], [place] [address]  ?Office Phone: [phone] ? ? ? ?Tiffany Thornton ?03/02/2022, 8:40 AM ? ? ?Weber Cooks, MD ? ?Contact information:   ?Weekdays 7am-5pm epic message Tiffany Thornton, or call office for patient follow up: [phone] ?After hours and holidays please check [email or web address] for group call information for [facility] ? ?  ?

## 2022-03-02 NOTE — [place] FL2 (Signed)
?[place] MEDICAID FL2 LEVEL OF CARE SCREENING TOOL  ?  ? ?IDENTIFICATION  ?Patient Name: ?Tiffany Thornton Birthdate: 01-26-1933 Sex: female Admission Date (Current Location): ?02/28/2022  ?[place] and [place] Number: ? [place] ?  Facility and Address:  ?[facility], [place] [address] [place], [place] [address] ?     Provider Number: ?[id number]  ?Attending Physician Name and Address:  ?Terrilee Croak, MD ? Relative Name and Phone Number:  ?Eslick,[name] Son [phone] ?   ?Current Level of Care: ?Hospital Recommended Level of Care: ?[place] Prior Approval Number: ?  ? ?Date Approved/Denied: ?  PASRR Number: ?[id number] ? ?Discharge Plan: ?SNF ?  ? ?Current Diagnoses: ?Patient Active Problem List  ? Diagnosis Date Noted  ? Protein-calorie malnutrition, severe 03/01/2022  ? Closed left hip fracture ([place]) 02/28/2022  ? Hypothyroidism 02/28/2022  ? Anxiety 02/28/2022  ? Fall at home, initial encounter 02/28/2022  ? Hyponatremia 10/14/2021  ? Postoperative anemia due to acute blood loss 10/14/2021  ? Intention tremor 10/14/2021  ? Closed right hip fracture ([place]) 10/06/2021  ? Cerebellar hemorrhage, nontraumatic (HCC) 05/07/2018  ? Ataxia, post-stroke   ? Essential hypertension   ? Prediabetes   ? Hypokalemia   ? Dizziness and giddiness   ? Cognitive deficit, post-stroke   ? Hypertensive emergency   ? HLD (hyperlipidemia)   ? Cerebellar edema (HCC)   ? Leukocytosis   ? S/P craniotomy 04/30/2018  ? 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 ? ?Orientation RESPIRATION BLADDER Height & Weight   ?  ?Self, Time, Situation, Place ? Normal Continent Weight: 111 lb (50.3 kg) ?Height:  5\' 6"  (167.6 cm)  ?BEHAVIORAL SYMPTOMS/MOOD NEUROLOGICAL BOWEL NUTRITION STATUS  ?    Continent Diet (Refer to d/c summary)  ?AMBULATORY STATUS COMMUNICATION OF NEEDS Skin   ?Extensive Assist Verbally Surgical wounds (s/p L  hip hemiarthroplasty, 4/4) ?  ?  ?  ?    ?     ?      ? ? ?Personal Care Assistance Level of Assistance  ?Bathing, Feeding, Dressing Bathing Assistance: Maximum assistance ?Feeding assistance: Independent ?Dressing Assistance: Maximum assistance ?   ? ?Functional Limitations Info  ?Sight, Hearing, Speech Sight Info: Adequate ?Hearing Info: Adequate ?Speech Info: Adequate  ? ? ?SPECIAL CARE FACTORS FREQUENCY  ?PT (By licensed PT), OT (By licensed OT)   ?  ?PT Frequency: 5x/week, evaluate and treat ?OT Frequency: 5x/week, evaluate and treat ?  ?  ?  ?   ? ? ?Contractures Contractures Info: Not present  ? ? ?Additional Factors Info  ?Code Status, Allergies Code Status Info: DNR ?Allergies Info: Hydrochlorothiazide, Latex ?  ?  ?  ?   ? ?Current Medications (03/02/2022):  This is the current hospital active medication list ?Current Facility-Administered Medications  ?Medication Dose Route Frequency Provider Last Rate Last Admin  ? 0.9 %  sodium chloride infusion   Intravenous Continuous Willaim Sheng, MD      ? acetaminophen (TYLENOL) tablet 650 mg  650 mg Oral Q6H PRN Willaim Sheng, MD   650 mg at 02/28/22 2117  ? amLODipine (NORVASC) tablet 5 mg  5 mg Oral q morning Willaim Sheng, MD   5 mg at 03/02/22 [id number]  ? aspirin EC tablet 81 mg  81 mg Oral Q48H Marchwiany, Virgina Norfolk, MD      ? Chlorhexidine Gluconate Cloth 2 % PADS 6 each  6 each Topical Daily Dahal, Marlowe Aschoff, MD  6 each at 03/02/22 0818  ? cholecalciferol (VITAMIN D3) tablet 1,000 Units  1,000 Units Oral Daily Willaim Sheng, MD   1,000 Units at 03/02/22 0912  ? docusate sodium (COLACE) capsule 100 mg  100 mg Oral Once PRN Kathryne Eriksson, NP      ? enoxaparin (LOVENOX) injection 40 mg  40 mg Subcutaneous Q24H Willaim Sheng, MD   40 mg at 03/02/22 [id number]  ? hydrALAZINE (APRESOLINE) tablet 25 mg  25 mg Oral Q8H Willaim Sheng, MD   25 mg at 03/02/22 0601  ? HYDROcodone-acetaminophen (NORCO/VICODIN) 5-325 MG per tablet 1-2 tablet  1-2 tablet Oral Q6H PRN Willaim Sheng, MD    1 tablet at 03/02/22 0950  ? levothyroxine (SYNTHROID) tablet 100 mcg  100 mcg Oral QAC breakfast Willaim Sheng, MD   100 mcg at 03/02/22 0601  ? metoprolol tartrate (LOPRESSOR) tablet 25 mg  25 mg Oral BID Willaim Sheng, MD   25 mg at 03/02/22 [id number]  ? morphine (PF) 2 MG/ML injection 0.5 mg  0.5 mg Intravenous Q2H PRN Willaim Sheng, MD      ? multivitamin with minerals tablet 1 tablet  1 tablet Oral Q1400 Willaim Sheng, MD   1 tablet at 03/02/22 1420  ? rosuvastatin (CRESTOR) tablet 5 mg  5 mg Oral QHS Willaim Sheng, MD   5 mg at 03/01/22 2228  ? ? ? ?Discharge Medications: ?Please see discharge summary for a list of discharge medications. ? ?Relevant Imaging Results: ? ?Relevant Lab Results: ? ? ?Additional Information ?SS# [id number] ? ?Joanne Chars, LCSW ? ? ? ? ?

## 2022-03-02 NOTE — Progress Notes (Signed)
?PROGRESS NOTE ? ?Tiffany Thornton  ?DOB: December 16, 1932  ?PCP: Blair Heys, MD ?GGE:[id number]  ?DOA: 02/28/2022 ? LOS: 2 days  ?Hospital Day: 3 ? ?Brief narrative: ?Tiffany Thornton is a 88 y.o. female with PMH significant for HTN, HLD, hypothyroidism, anxiety, history of brain aneurysm leading to ICH s/p craniotomy with hematoma evacuation in 2019. ?After her brain bleed in 2019, patient has some tremors and unsteadiness for which she uses a walker. ?4/3, while trying to reach her walker, she lost her balance and fell to the floor and started to hurt in her left hip.  She did not hit her head or pass out ?In the ED, patient was hemodynamically stable ?X-ray of the left hip showed impacted transcervical fracture of the left femoral neck ?Skeletal survey negative otherwise ? ?Admitted to hospital service ?Orthopedics consulted ? ?Subjective: ?Patient was seen and examined this morning.  Pleasant elderly Caucasian female. ?Underwent left cemented bipolar hemiarthroplasty yesterday. ?No pain overnight.  Tachycardic this morning.  Regular rhythm. ? ?Principal Problem: ?  Closed left hip fracture (HCC) ?Active Problems: ?  HLD (hyperlipidemia) ?  Essential hypertension ?  Hypothyroidism ?  Anxiety ?  Fall at home, initial encounter ?  Protein-calorie malnutrition, severe ?  ? ?Assessment and Plan: ?Impacted transcervical fracture of the left femoral neck ?-Secondary to fall by losing her balance ?-4/4, underwent left cemented bipolar hemiarthroplasty ?-Currently on DVT prophylaxis with Lovenox.  Pain control with Norco as needed ?-Pending PT eval ? ?Essential hypertension ?-PTA metoprolol 25 mg twice daily, amlodipine 5 mg daily, hydralazine 25 mg 3 times daily, ?-Patient was given an extra dose of metoprolol this morning because of tachycardia.  ?Continue to monitor blood pressure and heart rate. ?  ?History of ICH due to aneurysm rupture  ?HLD ?-PTA aspirin 81 mg every 48 hours, Crestor 5 mg daily ?-Continue both. ?   ?Hypothyroidism ?-Continue Synthroid ?  ?Goals of care ?  Code Status: DNR  ? ? ?Mobility: Encourage ambulation postsurgery ? ?Nutritional status:  ?Body mass index is 17.92 kg/m?Marland Kitchen  ?Nutrition Problem: Severe Malnutrition ?Etiology: chronic illness (History of ICH due to aneurysm rupture) ?Signs/Symptoms: severe fat depletion, severe muscle depletion ? ? ? ? ?Diet:  ?Diet Order   ? ?       ?  Diet regular Room service appropriate? Yes; Fluid consistency: Thin  Diet effective now       ?  ? ?  ?  ? ?  ? ? ?DVT prophylaxis:  ?enoxaparin (LOVENOX) injection 40 mg Start: 03/02/22 0900 ?SCDs Start: 02/28/22 1417 ?  ?Antimicrobials: None ?Fluid: Currently on IV fluid tomorrow morning because of hypotension this afternoon ?Consultants: Orthopedics ?Family Communication: Son not at bedside today ? ?Status is: Inpatient ? ?Continue in-hospital care because: POD1 ?Level of care: Telemetry Medical  ? ?Dispo: The patient is from: Home ?             Anticipated d/c is to: Home versus rehab.  Needs PT eval postprocedure ?             Patient currently is not medically stable to d/c. ?  Difficult to place patient No ? ? ? ? ?Infusions:  ? sodium chloride    ? ? ?Scheduled Meds: ? amLODipine  5 mg Oral q morning  ? aspirin EC  81 mg Oral Q48H  ? Chlorhexidine Gluconate Cloth  6 each Topical Daily  ? cholecalciferol  1,000 Units Oral Daily  ? enoxaparin  40 mg Subcutaneous Q24H  ?  hydrALAZINE  25 mg Oral Q8H  ? levothyroxine  100 mcg Oral QAC breakfast  ? metoprolol tartrate  25 mg Oral BID  ? multivitamin with minerals  1 tablet Oral Q1400  ? rosuvastatin  5 mg Oral QHS  ? ? ?PRN meds: ?acetaminophen, docusate sodium, HYDROcodone-acetaminophen, morphine injection  ? ?Antimicrobials: ?Anti-infectives (From admission, onward)  ? ? Start     Dose/Rate Route Frequency Ordered Stop  ? 03/02/22 0600  ceFAZolin (ANCEF) IVPB 2g/100 mL premix  Status:  Discontinued       ? 2 g ?200 mL/hr over 30 Minutes Intravenous On call to O.R. 03/01/22  1845 03/01/22 1858  ? 03/01/22 2330  ceFAZolin (ANCEF) IVPB 2g/100 mL premix       ? 2 g ?200 mL/hr over 30 Minutes Intravenous Every 8 hours 03/01/22 1846 03/02/22 1610  ? ?  ? ? ?Objective: ?Vitals:  ? 03/02/22 0430 03/02/22 0742  ?BP: (!) 164/86 129/64  ?Pulse: 98 100  ?Resp:  15  ?Temp:  98.7 ?F (37.1 ?C)  ?SpO2: 95% 96%  ? ? ?Intake/Output Summary (Last 24 hours) at 03/02/2022 1430 ?Last data filed at 03/01/2022 1740 ?Gross per 24 hour  ?Intake 800 ml  ?Output 250 ml  ?Net 550 ml  ? ? ?Filed Weights  ? 02/28/22 1026 03/01/22 1314 03/01/22 1320  ?Weight: 51 kg 51 kg 50.3 kg  ? ?Weight change: 0 kg ?Body mass index is 17.92 kg/m?.  ? ?Physical Exam: ?General exam: Pleasant, elderly Caucasian female.  Not in physical distress at rest ?Skin: No rashes, lesions or ulcers. ?HEENT: Atraumatic, normocephalic, no obvious bleeding ?Lungs: Clear to auscultation bilaterally ?CVS: Regular rhythm, mildly tachycardic, no murmur  ?GI/Abd soft, nontender, nondistended, bowel sound present ?CNS: Alert, awake, oriented x3 ?Psychiatry: Mood appropriate ?Extremities: No pedal edema, no calf tenderness ? ?Data Review: I have personally reviewed the laboratory data and studies available. ? ?F/u labs ordered ?Unresulted Labs (From admission, onward)  ? ? None  ? ?  ? ? ?Signed, ?Lorin Glass, MD ?[facility] ?03/02/2022 ? ? ? ? ? ? ? ? ? ? ? ? ?

## 2022-03-02 NOTE — TOC Progression Note (Addendum)
Transition of Care (TOC) - Progression Note  ? ? ?Patient Details  ?Name: JAIRY ANGULO ?MRN: [id number] ?Date of Birth: [birth year] ? ?Transition of Care (TOC) CM/SW Contact  ?Lorri Frederick, LCSW ?Phone Number: ?03/02/2022, 4:14 PM ? ?Clinical Narrative:   PT recommends SNF, CSW spoke with pt who confirms desire for SNF, she was at [facility] recently, would like to return.  Pt daughter Eunice Blase called while CSW in room and confirms this as well.  Permission given to speak with all of pt four children listed on face sheet.  From [place], independent living. ? ?CSW spoke with [facility] who can accept pt but may have staffing issues if pt cannot admit to [facility] tomorrow.  Auth request submitted in [place].  ? ? ? ?Expected Discharge Plan: Skilled Nursing Facility ?Barriers to Discharge: Continued Medical Work up, Other (must enter comment) (insurance auth) ? ?Expected Discharge Plan and Services ?Expected Discharge Plan: Skilled Nursing Facility ?In-house Referral: Clinical Social Work ?Discharge Planning Services: CM Consult ?Post Acute Care Choice: Skilled Nursing Facility ?Living arrangements for the past 2 months: Independent Living Facility Building surveyor) ?                ?  ?  ?  ?  ?  ?  ?  ?  ?  ?  ? ? ?Social Determinants of Health (SDOH) Interventions ?  ? ?Readmission Risk Interventions ?   ? View : No data to display.  ?  ?  ?  ? ? ?

## 2022-03-02 NOTE — TOC Initial Note (Signed)
Transition of Care (TOC) - Initial/Assessment Note  ? ? ?Patient Details  ?Name: Tiffany Thornton ?MRN: [id number] ?Date of Birth: 1933/05/25 ? ?Transition of Care [facility]) CM/SW Contact:    ?Tiffany Mons, RN ?Phone Number: ?03/02/2022, 1:56 PM ? ?Clinical Narrative:      ?Admitted s/p a fall and suffered a L hip fx.  Pt resides @ the [facility].    ?  s/p L cemented bipolar hemiarthroplasty, 4/4 ?PT pending... ?NCM spoke pt regarding discharge planning. Pt states lives alone. Supportive family. Tiffany Thornton (son) , [place]. PTA pt states independent with ADL's . DME: RW, shower seat.  ?Pt  states she will need SNF placement @ d/c. Preference : [facility]. ?[id number]. ? ?TOC team following and will assist with needs. ? ?Expected Discharge Plan: [place] ?Barriers to Discharge: Continued Medical Work up ? ? ?Patient Goals and CMS Choice ?  ?  ?  ? ?Expected Discharge Plan and Services ?Expected Discharge Plan: [place] ?  ?Discharge Planning Services: CM Consult ?  ?Living arrangements for the past 2 months: [place] ?                ?  ?  ?  ?  ?  ?  ?  ?  ?  ?  ? ?Prior Living Arrangements/Services ?Living arrangements for the past 2 months: [place] ?Lives with:: Self ?Patient language and need for interpreter reviewed:: Yes ?Do you feel safe going back to the place where you live?: Yes      ?Need for Family Participation in Patient Care: Yes (Comment) ?Care giver support system in place?: Yes (comment) ?  ?Criminal Activity/Legal Involvement Pertinent to Current Situation/Hospitalization: No - Comment as needed ? ?Activities of Daily Living ?Home Assistive Devices/Equipment: Gilford Rile (specify type) ?ADL Screening (condition at time of admission) ?Patient's cognitive ability adequate to safely complete daily activities?: Yes ?Is the patient deaf or have difficulty hearing?: No ?Does the patient have difficulty seeing, even when wearing  glasses/contacts?: No ?Does the patient have difficulty concentrating, remembering, or making decisions?: No ?Patient able to express need for assistance with ADLs?: Yes ?Does the patient have difficulty dressing or bathing?: No ?Independently performs ADLs?: No ?Communication: Independent ?Dressing (OT): Needs assistance ?Is this a change from baseline?: Change from baseline, expected to last <3days ?Grooming: Independent ?Feeding: Independent ?Bathing: Needs assistance ?Is this a change from baseline?: Change from baseline, expected to last <3 days ?Toileting: Needs assistance ?Is this a change from baseline?: Change from baseline, expected to last <3 days ?In/Out Bed: Needs assistance ?Is this a change from baseline?: Change from baseline, expected to last <3 days ?Walks in Home: Needs assistance ?Is this a change from baseline?: Change from baseline, expected to last <3 days ?Does the patient have difficulty walking or climbing stairs?: Yes ?Weakness of Legs: Both ?Weakness of Arms/Hands: Both ? ?Permission Sought/Granted ?  ?Permission granted to share information with : Yes, Verbal Permission Granted ? Share Information with NAME: Tiffany Thornton (Son) [phone] ?   ?   ?   ? ?Emotional Assessment ?Appearance:: Appears stated age ?Attitude/Demeanor/Rapport: Gracious ?Affect (typically observed): Accepting ?Orientation: : Oriented to Self, Oriented to Place, Oriented to  Time, Oriented to Situation ?Alcohol / Substance Use: Not Applicable ?Psych Involvement: No (comment) ? ?Admission diagnosis:  Closed left hip fracture ([place]) [S72.002A] ?Closed fracture of left hip, initial encounter ([place]) [S72.002A] ?Closed fracture of neck of left femur, initial encounter ([place]) [S72.002A] ?Patient Active  Problem List  ? Diagnosis Date Noted  ? Protein-calorie malnutrition, severe 03/01/2022  ? Closed left hip fracture ([place]) 02/28/2022  ? Hypothyroidism 02/28/2022  ? Anxiety 02/28/2022  ? Fall at home, initial encounter 02/28/2022   ? Hyponatremia 10/14/2021  ? Postoperative anemia due to acute blood loss 10/14/2021  ? Intention tremor 10/14/2021  ? Closed right hip fracture ([place]) 10/06/2021  ? Cerebellar hemorrhage, nontraumatic (HCC) 05/07/2018  ? Ataxia, post-stroke   ? Essential hypertension   ? Prediabetes   ? Hypokalemia   ? Dizziness and giddiness   ? Cognitive deficit, post-stroke   ? Hypertensive emergency   ? HLD (hyperlipidemia)   ? Cerebellar edema (HCC)   ? Leukocytosis   ? S/P craniotomy 04/30/2018  ? 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 ?PCP:  Tiffany Arabian, MD ?Pharmacy:   ?[id number] - [place], [place] - [address] ?[place] [address] ?Phone: [phone] Fax: [phone] ? ? ? ? ?Social Determinants of Health (SDOH) Interventions ?  ? ?Readmission Risk Interventions ?   ? View : No data to display.  ?  ?  ?  ? ? ? ?

## 2022-03-02 NOTE — TOC CAGE-AID Note (Signed)
Transition of Care (TOC) - CAGE-AID Screening ? ? ?Patient Details  ?Name: Tiffany Thornton ?MRN: [id number] ?Date of Birth: Jul 27, 1933 ? ?Transition of Care (TOC) CM/SW Contact:    ?Marguarite Markov C Tarpley-Carter, LCSWA ?Phone Number: ?03/02/2022, 3:20 PM ? ? ?Clinical Narrative: ?Pt participated in Cage-Aid.  Pt stated she does not use substance or ETOH.  Pt was not offered resources, due to no usage of substance or ETOH.    ? ?Insurance underwriter, MSW, LCSW-A ?Pronouns:  She/Her/Hers ?[facility] ?Clinical Social Worker ?Direct Number:  [phone] ?[email or web address] ? ?CAGE-AID Screening: ?  ? ?Have You Ever Felt You Ought to Cut Down on Your Drinking or Drug Use?: No ?Have People Annoyed You By Critizing Your Drinking Or Drug Use?: No ?Have You Felt Bad Or Guilty About Your Drinking Or Drug Use?: No ?Have You Ever Had a Drink or Used Drugs First Thing In The Morning to Steady Your Nerves or to Get Rid of a Hangover?: No ?CAGE-AID Score: 0 ? ?Substance Abuse Education Offered: No ? ?  ? ? ? ? ? ? ?

## 2022-03-03 ENCOUNTER — Encounter (HOSPITAL_COMMUNITY): Payer: Self-pay | Admitting: Orthopedic Surgery

## 2022-03-03 DIAGNOSIS — S7292XD Unspecified fracture of left femur, subsequent encounter for closed fracture with routine healing: Secondary | ICD-10-CM | POA: Diagnosis not present

## 2022-03-03 DIAGNOSIS — Z79899 Other long term (current) drug therapy: Secondary | ICD-10-CM | POA: Diagnosis not present

## 2022-03-03 DIAGNOSIS — Z8673 Personal history of transient ischemic attack (TIA), and cerebral infarction without residual deficits: Secondary | ICD-10-CM | POA: Diagnosis not present

## 2022-03-03 DIAGNOSIS — E039 Hypothyroidism, unspecified: Secondary | ICD-10-CM | POA: Diagnosis not present

## 2022-03-03 DIAGNOSIS — Z4789 Encounter for other orthopedic aftercare: Secondary | ICD-10-CM | POA: Diagnosis not present

## 2022-03-03 DIAGNOSIS — R404 Transient alteration of awareness: Secondary | ICD-10-CM | POA: Diagnosis not present

## 2022-03-03 DIAGNOSIS — I739 Peripheral vascular disease, unspecified: Secondary | ICD-10-CM | POA: Diagnosis not present

## 2022-03-03 DIAGNOSIS — Z7401 Bed confinement status: Secondary | ICD-10-CM | POA: Diagnosis not present

## 2022-03-03 DIAGNOSIS — Z4889 Encounter for other specified surgical aftercare: Secondary | ICD-10-CM | POA: Diagnosis not present

## 2022-03-03 DIAGNOSIS — W19XXXD Unspecified fall, subsequent encounter: Secondary | ICD-10-CM | POA: Diagnosis not present

## 2022-03-03 DIAGNOSIS — Z96642 Presence of left artificial hip joint: Secondary | ICD-10-CM | POA: Diagnosis not present

## 2022-03-03 DIAGNOSIS — Z743 Need for continuous supervision: Secondary | ICD-10-CM | POA: Diagnosis not present

## 2022-03-03 DIAGNOSIS — R6889 Other general symptoms and signs: Secondary | ICD-10-CM | POA: Diagnosis not present

## 2022-03-03 DIAGNOSIS — E785 Hyperlipidemia, unspecified: Secondary | ICD-10-CM | POA: Diagnosis not present

## 2022-03-03 DIAGNOSIS — M25551 Pain in right hip: Secondary | ICD-10-CM | POA: Diagnosis not present

## 2022-03-03 DIAGNOSIS — I10 Essential (primary) hypertension: Secondary | ICD-10-CM | POA: Diagnosis not present

## 2022-03-03 DIAGNOSIS — S72002D Fracture of unspecified part of neck of left femur, subsequent encounter for closed fracture with routine healing: Secondary | ICD-10-CM | POA: Diagnosis not present

## 2022-03-03 DIAGNOSIS — I959 Hypotension, unspecified: Secondary | ICD-10-CM | POA: Diagnosis not present

## 2022-03-03 DIAGNOSIS — M25552 Pain in left hip: Secondary | ICD-10-CM | POA: Diagnosis not present

## 2022-03-03 DIAGNOSIS — I6200 Nontraumatic subdural hemorrhage, unspecified: Secondary | ICD-10-CM | POA: Diagnosis not present

## 2022-03-03 DIAGNOSIS — S72002A Fracture of unspecified part of neck of left femur, initial encounter for closed fracture: Secondary | ICD-10-CM | POA: Diagnosis not present

## 2022-03-03 DIAGNOSIS — E43 Unspecified severe protein-calorie malnutrition: Secondary | ICD-10-CM | POA: Diagnosis not present

## 2022-03-03 DIAGNOSIS — K5900 Constipation, unspecified: Secondary | ICD-10-CM | POA: Diagnosis not present

## 2022-03-03 DIAGNOSIS — E034 Atrophy of thyroid (acquired): Secondary | ICD-10-CM | POA: Diagnosis not present

## 2022-03-03 MED ORDER — ENOXAPARIN SODIUM 40 MG/0.4ML IJ SOSY: 40.0000 mg | PREFILLED_SYRINGE | INTRAMUSCULAR | Status: DC

## 2022-03-03 MED ORDER — HYDROCODONE-ACETAMINOPHEN 5-325 MG PO TABS
1.0000 | ORAL_TABLET | Freq: Four times a day (QID) | ORAL | 0 refills | Status: AC | PRN
Start: 2022-03-03 — End: 2022-03-08

## 2022-03-03 MED ORDER — ACETAMINOPHEN 325 MG PO TABS: 650.0000 mg | ORAL_TABLET | Freq: Four times a day (QID) | ORAL | Status: AC | PRN

## 2022-03-03 NOTE — TOC Progression Note (Signed)
Transition of Care (TOC) - Progression Note  ? ? ?Patient Details  ?Name: Tiffany Thornton ?MRN: [id number] ?Date of Birth: February 27, 1933 ? ?Transition of Care (TOC) CM/SW Contact  ?Joanne Chars, LCSW ?Phone Number: ?03/03/2022, 8:41 AM ? ?Clinical Narrative:   Josem Kaufmann [id number] [id number], 5 days: 4/6-4/10 ? ? ? ?Expected Discharge Plan: [place] ?Barriers to Discharge: Continued Medical Work up, Other (must enter comment) (insurance auth) ? ?Expected Discharge Plan and Services ?Expected Discharge Plan: [place] ?In-house Referral: Clinical Social Work ?Discharge Planning Services: CM Consult ?Post Acute Care Choice: [place] ?Living arrangements for the past 2 months: [place] Management consultant) ?                ?  ?  ?  ?  ?  ?  ?  ?  ?  ?  ? ? ?Social Determinants of Health (SDOH) Interventions ?  ? ?Readmission Risk Interventions ?   ? View : No data to display.  ?  ?  ?  ? ? ?

## 2022-03-03 NOTE — Progress Notes (Signed)
Physical Therapy Treatment ?Patient Details ?Name: Tiffany Thornton ?MRN: [id number] ?DOB: 1933-03-28 ?Today's Date: 03/03/2022 ? ? ?History of Present Illness Pt is an 88 y/o female who presents from ALF s/p mechanical fall, sustaining a L femoral neck fracture. She is now s/p L hip hemiarthroplasty, posterior precautions, and is WBAT. PMH significant for HTN, HLD, hypothyroidism, anxiety, history of brain aneurysm leading to [place] s/p craniotomy with hematoma evacuation in 2019. ? ?  ?PT Comments  ? ? Patient is progressing towards goals with skilled PT interventions performed today including gait training and increased activity tolerance with mobility tasks. Patient required max A for bed mobility with significant increased time for LE management and scooting to EOB. Pt required mod assist with cues to maintain precautions for ambulation using RW. Pt would benefit from continued cues for maintaining posterior hip precautions during mobility tasks for increased carryover. Pt will benefit from continued skilled PT to maximize pt's safety and independence with functional mobility.  ?     ?Recommendations for follow up therapy are one component of a multi-disciplinary discharge planning process, led by the attending physician.  Recommendations may be updated based on patient status, additional functional criteria and insurance authorization. ? ?Follow Up Recommendations ? Skilled nursing-short term rehab (<3 hours/day) ?  ?  ?Assistance Recommended at Discharge Frequent or constant Supervision/Assistance  ?Patient can return home with the following Two people to help with walking and/or transfers;A lot of help with bathing/dressing/bathroom;Assistance with cooking/housework;Assist for transportation ?  ?Equipment Recommendations ? Rolling walker (2 wheels)  ?  ?Recommendations for Other Services   ? ? ?  ?Precautions / Restrictions Precautions ?Precautions: Fall;Posterior Hip ?Precaution Booklet Issued: Yes  (comment) ?Precaution Comments: Pt able to verbalize precautions ?Restrictions ?Weight Bearing Restrictions: Yes ?LLE Weight Bearing: Weight bearing as tolerated  ?  ? ?Mobility ? Bed Mobility ?Overal bed mobility: Needs Assistance ?Bed Mobility: Supine to Sit, Sit to Supine ?  ?  ?Supine to sit: Max assist ?Sit to supine: Max assist ?  ?General bed mobility comments: pt requires significant increased time and effort to transition to EOB. Pt required max A with use of bed rails and SPT for UE support and trunk elevation. Pt able to assist with LE management, however, limited carryover of precautions with max cues to limit IR. Bed pad utilized to scoot hips to EOB and position in supine. ?  ? ?Transfers ?Overall transfer level: Needs assistance ?Equipment used: Rolling walker (2 wheels) ?Transfers: Sit to/from Stand, Bed to chair/wheelchair/BSC ?Sit to Stand: Max assist ?Stand pivot transfers: Max assist ?  ?  ?  ?  ?General transfer comment: max A for power up to standing and for separate stand pivot transfer from recliner to bed at end of session. Pt required max cues for stand pivot transfer to maintain precautions and RW management. ?  ? ?Ambulation/Gait ?Ambulation/Gait assistance: Mod assist ?Gait Distance (Feet): 6 Feet ?Assistive device: Rolling walker (2 wheels) ?Gait Pattern/deviations: Step-to pattern, Decreased stride length, Decreased step length - left, Decreased step length - right, Trunk flexed, Narrow base of support, Decreased weight shift to left ?Gait velocity: decreased ?Gait velocity interpretation: <1.31 ft/sec, indicative of household ambulator ?  ?General Gait Details: pt required mod A for steadying and supporting during ambulation while pt used RW for BUE support. Pt educated on gait training and required VC's for limiting IR. She required 3 standing breaks between steps ? ? ?Stairs ?  ?  ?  ?  ?  ? ? ?  Wheelchair Mobility ?  ? ?Modified Rankin (Stroke Patients Only) ?  ? ? ?  ?Balance  Overall balance assessment: Needs assistance ?Sitting-balance support: Feet supported, No upper extremity supported ?Sitting balance-Leahy Scale: Fair ?  ?  ?Standing balance support: Bilateral upper extremity supported, During functional activity ?Standing balance-Leahy Scale: Zero ?Standing balance comment: mod A required in standing while using RW for BUE support. ?  ?  ?  ?  ?  ?  ?  ?  ?  ?  ?  ?  ? ?  ?Cognition Arousal/Alertness: Awake/alert ?Behavior During Therapy: [facility] for tasks assessed/performed ?Overall Cognitive Status: Within Functional Limits for tasks assessed ?  ?  ?  ?  ?  ?  ?  ?  ?  ?  ?  ?  ?  ?  ?  ?  ?General Comments: limited problem solving and carryover of precautions during mobility ?  ?  ? ?  ?Exercises   ? ?  ?General Comments   ?  ?  ? ?Pertinent Vitals/Pain Pain Assessment ?Pain Assessment: Faces ?Faces Pain Scale: Hurts even more ?Pain Location: L hip, headache ?Pain Descriptors / Indicators: Operative site guarding, Sore, Aching ?Pain Intervention(s): Limited activity within patient's tolerance  ? ? ?Home Living   ?  ?  ?  ?  ?  ?  ?  ?  ?  ?   ?  ?Prior Function    ?  ?  ?   ? ?PT Goals (current goals can now be found in the care plan section) Acute Rehab PT Goals ?Patient Stated Goal: get back to her ALF ?PT Goal Formulation: With patient ?Time For Goal Achievement: 03/16/22 ?Potential to Achieve Goals: Good ?Progress towards PT goals: Progressing toward goals ? ?  ?Frequency ? ? ? Min 3X/week ? ? ? ?  ?PT Plan Current plan remains appropriate  ? ? ?Co-evaluation   ?  ?  ?  ?  ? ?  ?AM-PAC PT "6 Clicks" Mobility   ?Outcome Measure ? Help needed turning from your back to your side while in a flat bed without using bedrails?: A Lot ?Help needed moving from lying on your back to sitting on the side of a flat bed without using bedrails?: A Lot ?Help needed moving to and from a bed to a chair (including a wheelchair)?: A Lot ?Help needed standing up from a chair using your arms (e.g.,  wheelchair or bedside chair)?: A Lot ?Help needed to walk in hospital room?: A Lot ?Help needed climbing 3-5 steps with a railing? : Total ?6 Click Score: 11 ? ?  ?End of Session Equipment Utilized During Treatment: Gait belt ?Activity Tolerance: Patient limited by pain;Patient limited by fatigue ?Patient left: in bed;with call bell/phone within reach;with bed alarm set;with nursing/sitter in room ?Nurse Communication: Mobility status ?PT Visit Diagnosis: Unsteadiness on feet (R26.81);Pain;History of falling (Z91.81) ?Pain - Right/Left: Left ?Pain - part of body: Hip ?  ? ? ?Time: [id number] ?PT Time Calculation (min) (ACUTE ONLY): 27 min ? ?Charges:  $Gait Training: 23-37 mins          ?          ? ?Jonne Ply, SPT ? ? ?Jonne Ply ?03/03/2022, 1:55 PM ? ?

## 2022-03-03 NOTE — Discharge Summary (Signed)
? ?Physician Discharge Summary  ?Tiffany Thornton [id number] DOB: 1933/06/28 DOA: 02/28/2022 ? ?PCP: Blair HeysEhinger, Robert, MD ? ?Admit date: 02/28/2022 ?Discharge date: 03/03/2022 ? ?Admitted From: Home ?Discharge disposition: SNF ? ?Recommendations at discharge:  ?Lovenox for 4 weeks for DVT prophylaxis.   ?Follow-up with orthopedic as an outpatient ? ?Brief narrative: ?Tiffany Thornton is a 88 y.o. female with PMH significant for HTN, HLD, hypothyroidism, anxiety, history of brain aneurysm leading to ICH s/p craniotomy with hematoma evacuation in 2019. ?After her brain bleed in 2019, patient has some tremors and unsteadiness for which she uses a walker. ?4/3, while trying to reach her walker, she lost her balance and fell to the floor and started to hurt in her left hip.  She did not hit her head or pass out ?In the ED, patient was hemodynamically stable ?X-ray of the left hip showed impacted transcervical fracture of the left femoral neck ?Skeletal survey negative otherwise ? ?Admitted to hospital service ?Orthopedics consulted ? ?Subjective: ?Patient was seen and examined this morning.  Pleasant elderly Caucasian female. ?Lying on bed.  Not in distress. ? ?Principal Problem: ?  Closed left hip fracture (HCC) ?Active Problems: ?  HLD (hyperlipidemia) ?  Essential hypertension ?  Hypothyroidism ?  Anxiety ?  Fall at home, initial encounter ?  Protein-calorie malnutrition, severe ?  ? ?Hospital course: ?Impacted transcervical fracture of the left femoral neck ?-Secondary to fall by losing her balance ?-4/4, underwent left cemented bipolar hemiarthroplasty ?-Currently on DVT prophylaxis with Lovenox.  Pain control with Norco as needed ?-PT eval appreciated.  SNF recommended. ? ?Essential hypertension ?-PTA metoprolol 25 mg twice daily, amlodipine 5 mg daily, hydralazine 25 mg 3 times daily, ?-Continue the same. ?  ?History of ICH due to aneurysm rupture  ?HLD ?-PTA aspirin 81 mg every 48 hours, Crestor 5 mg daily ?-Continue both. ?   ?Hypothyroidism ?-Continue Synthroid ? ?Hyponatremia ?-Because of compromised oral intake due to surgery ?-Expect improvement once back to normal diet.  Not on diuretics ?Recent Labs  ?Lab 02/28/22 ?1039 03/01/22 ?0407 03/02/22 ?0315  ?NA 137 138 133*  ? ?  ?Goals of care ?  Code Status: DNR  ? ? ?Mobility: Encourage ambulation postsurgery ? ?Nutritional status:  ?Body mass index is 17.92 kg/m?Marland Kitchen.  ?Nutrition Problem: Severe Malnutrition ?Etiology: chronic illness (History of ICH due to aneurysm rupture) ?Signs/Symptoms: severe fat depletion, severe muscle depletion ? ? ?Wounds:  ?- ?Incision (Closed) 04/30/18 Head Other (Comment) (Active)  ?Date First Assessed/Time First Assessed: 04/30/18 1019   Location: Head  Location Orientation: Other (Comment)  ?  ?Assessments 04/30/2018 11:00 AM 05/22/2018  8:15 AM  ?Dressing Type Honeycomb None  ?Dressing Intact;Old drainage (marked) --  ?Dressing Change Frequency Other (Comment) --  ?Site / Wound Assessment Dressing in place / Unable to assess Clean;Dry  ?Margins -- Attached edges (approximated)  ?Drainage Description -- Other (Comment)  ?   ?No Linked orders to display  ?   ?Incision (Closed) 10/06/21 Hip Right (Active)  ?Date First Assessed/Time First Assessed: 10/06/21 1729   Location: Hip  Location Orientation: Right  ?  ?Assessments 10/06/2021  6:15 PM 10/12/2021  8:28 AM  ?Dressing Type Hydrocolloid Hydrocolloid  ?Dressing Clean;Dry;Intact Clean;Dry  ?Site / Wound Assessment Dressing in place / Unable to assess Clean;Dry  ?Margins Attached edges (approximated) Attached edges (approximated)  ?Drainage Amount None None  ?   ?No Linked orders to display  ?   ?Incision (Closed) 03/01/22 Hip Left (Active)  ?Date First Assessed/Time  First Assessed: 03/01/22 1706   Location: Hip  Location Orientation: Left  ?  ?Assessments 03/01/2022  5:45 PM 03/03/2022  8:53 AM  ?Dressing Type Silver hydrofiber Silver dressings  ?Dressing Clean, Dry, Intact --  ?Site / Wound Assessment Dressing in  place / Unable to assess --  ?Drainage Amount None --  ?   ?No Linked orders to display  ? ? ?Discharge Exam:  ? ?Vitals:  ? 03/02/22 2300 03/03/22 0000 03/03/22 0541 03/03/22 0816  ?BP: (!) 111/36 (!) 122/35 (!) 178/54 (!) 143/50  ?Pulse: 81 81 84 81  ?Resp: 19 18 18 14   ?Temp: 97.9 ?F (36.6 ?C)  98.2 ?F (36.8 ?C) 99.1 ?F (37.3 ?C)  ?TempSrc: Oral  Oral Oral  ?SpO2: 92% 91% 97% 93%  ?Weight:      ?Height:      ? ? ?Body mass index is 17.92 kg/m?.  ?General exam: Pleasant, elderly Caucasian female.  Not in physical distress at rest ?Skin: No rashes, lesions or ulcers. ?HEENT: Atraumatic, normocephalic, no obvious bleeding ?Lungs: Clear to auscultation bilaterally ?CVS: Regular rhythm, mildly tachycardic, no murmur  ?GI/Abd soft, nontender, nondistended, bowel sound present ?CNS: Alert, awake, oriented x3 ?Psychiatry: Mood appropriate ?Extremities: No pedal edema, no calf tenderness ? ?Follow ups:  ? ? Contact information for follow-up providers   ? ? , MD. Schedule an appointment as soon as possible for a visit in 2 week(s).   ?Specialty: Orthopedic Surgery ?Contact information: ?[address] ?[address] ?[place] ?[phone] ? ? ?  ?  ? ? [phone], MD Follow up.   ?Specialty: Family Medicine ?Contact information: ?[address] ?[address] ?[place] ?[phone] ? ? ?  ?  ? ?  ?  ? ? Contact information for after-discharge care   ? ? Destination   ? ? [facility]erred [facility] .   ?Service: Skilled Nursing ?Contact information: ?[address] ?[place] ?[phone] ? ?  ?  ? ?  ?  ? ?  ?  ? ?  ? ? ?Discharge Instructions:  ? ?Discharge Instructions   ? ? Call MD for:  difficulty breathing, headache or visual disturbances   Complete by: As directed ?  ? Call MD for:  extreme fatigue   Complete by: As directed ?  ? Call MD for:  hives   Complete by: As directed ?  ? Call MD for:  persistant dizziness or  light-headedness   Complete by: As directed ?  ? Call MD for:  persistant nausea and vomiting   Complete by: As directed ?  ? Call MD for:  severe uncontrolled pain   Complete by: As directed ?  ? Call MD for:  temperature >100.4   Complete by: As directed ?  ? Diet general   Complete by: As directed ?  ? Discharge instructions   Complete by: As directed ?  ? General discharge instructions: ?Follow with Primary MD [phone], MD in 7 days  ?Please request your PCP  to go over your hospital tests, procedures, radiology results at the follow up. Please get your medicines reviewed and adjusted.  Your PCP may decide to repeat certain labs or tests as needed. ?Do not drive, operate heavy machinery, perform activities at heights, swimming or participation in water activities or provide baby sitting services if your were admitted for syncope or siezures until you have seen by Primary MD or a Neurologist and  advised to do so again. ?[place] Controlled Substance Reporting System database was reviewed. Do not drive, operate heavy machinery, perform activities at heights, swim, participate in water activities or provide baby-sitting services while on medications for pain, sleep and mood until your outpatient physician has reevaluated you and advised to do so again.  You are strongly recommended to comply with the dose, frequency and duration of prescribed medications. ?Activity: As tolerated with Full fall precautions use walker/cane & assistance as needed ?Avoid using any recreational substances like cigarette, tobacco, alcohol, or non-prescribed drug. ?If you experience worsening of your admission symptoms, develop shortness of breath, life threatening emergency, suicidal or homicidal thoughts you must seek medical attention immediately by calling 911 or calling your MD immediately  if symptoms less severe. ?You must read complete instructions/literature along with all the possible adverse reactions/side effects  for all the medicines you take and that have been prescribed to you. Take any new medicine only after you have completely understood and accepted all the possible adverse reactions/side effects.  ?[place]

## 2022-03-03 NOTE — Progress Notes (Signed)
? ? ? ?  Subjective: ?Patient reports having a good night, sleeping well and no concerns this morning.  She did work with physical therapy yesterday which she felt was challenging limited by pain.  She denies distal numbness and tingling.  No new issues.  Plan for discharge to SNF. ? ?Objective:  ? ?VITALS:   ?Vitals:  ? 03/02/22 2000 03/02/22 2300 03/03/22 0000 03/03/22 0541  ?BP: (!) 147/43 (!) 111/36 (!) 122/35 (!) 178/54  ?Pulse: 90 81 81 84  ?Resp: 15 19 18 18   ?Temp:  97.9 ?F (36.6 ?C)  98.2 ?F (36.8 ?C)  ?TempSrc:  Oral  Oral  ?SpO2: 97% 92% 91% 97%  ?Weight:      ?Height:      ? ? ?Sensation intact distally ?Intact pulses distally ?Dorsiflexion/Plantar flexion intact ?Incision: dressing C/D/I ?No cellulitis present ?Compartment soft ? ? ?Lab Results  ?Component Value Date  ? WBC 10.3 03/02/2022  ? HGB 11.6 (L) 03/02/2022  ? HCT 33.8 (L) 03/02/2022  ? MCV 89.4 03/02/2022  ? PLT 230 03/02/2022  ? ?BMET ?   ?Component Value Date/Time  ? NA 133 (L) 03/02/2022 0315  ? K 3.9 03/02/2022 0315  ? CL 100 03/02/2022 0315  ? CO2 25 03/02/2022 0315  ? GLUCOSE 143 (H) 03/02/2022 0315  ? BUN 19 03/02/2022 0315  ? CREATININE 0.98 03/02/2022 0315  ? CALCIUM 8.9 03/02/2022 0315  ? GFRNONAA 56 (L) 03/02/2022 0315  ? ? ?Xray: post op xrays show cemented left hip hemi in good position without adverse features ? ?Assessment/Plan: ?2 Days Post-Op  ? ?Principal Problem: ?  Closed left hip fracture (HCC) ?Active Problems: ?  HLD (hyperlipidemia) ?  Essential hypertension ?  Hypothyroidism ?  Anxiety ?  Fall at home, initial encounter ?  Protein-calorie malnutrition, severe ? ?Left hip hemiarthroplasty for femoral neck fracture 4/4 ? ?Post op recs: ?WB: WBAT with posterior hip precautions x6 weeks ?Abx: ancef x23 hours post op ?Imaging: PACU xrays ?Dressing: Aquacel dressing to be kept intact until follow-up ?DVT prophylaxis: lovenox starting POD1 x4 weeks ?Follow up: 2 weeks after surgery for a wound check with Dr. 6/4 at  [facility].  ?Address: [address], [place], [place]  ?Office Phone: [phone] ? ? ? ?Deondrick Searls A Shalik Sanfilippo ?03/03/2022, 8:15 AM ? ? ?05/03/2022, MD ? ?Contact information:   ?Weekdays 7am-5pm epic message Dr. 08-01-2006, or call office for patient follow up: [phone] ?After hours and holidays please check [email or web address] for group call information for [facility] ? ?  ?

## 2022-03-03 NOTE — TOC Transition Note (Signed)
Transition of Care (TOC) - CM/SW Discharge Note ? ? ?Patient Details  ?Name: Tiffany Thornton ?MRN: [id number] ?Date of Birth: 12-Feb-1933 ? ?Transition of Care [facility]) CM/SW Contact:  ?Lorri Frederick, LCSW ?Phone Number: ?03/03/2022, 11:35 AM ? ? ?Clinical Narrative:   Pt discharging to [facility].  RN call report to [phone].  ? ? ? ?Final next level of care: Skilled Nursing Facility ?Barriers to Discharge: Barriers Resolved ? ? ?Patient Goals and CMS Choice ?Patient states their goals for this hospitalization and ongoing recovery are:: walk again ?CMS Medicare.gov Compare Post Acute Care list provided to::  (pt requesting [facility]) ?  ? ?Discharge Placement ?  ?           ?Patient chooses bed at: [facility] [place] ?Patient to be transferred to facility by: [facility] ?Name of family member notified: son [name] ?Patient and family notified of of transfer: 03/03/22 ? ?Discharge Plan and Services ?In-house Referral: Clinical Social Work ?Discharge Planning Services: CM Consult ?Post Acute Care Choice: Skilled Nursing Facility          ?  ?  ?  ?  ?  ?  ?  ?  ?  ?  ? ?Social Determinants of Health (SDOH) Interventions ?  ? ? ?Readmission Risk Interventions ?   ? View : No data to display.  ?  ?  ?  ? ? ? ? ? ?

## 2022-03-03 NOTE — Care Management Important Message (Signed)
Important Message ? ?Patient Details  ?Name: Tiffany Thornton ?MRN: [id number] ?Date of Birth: [birth year] ? ? ?Medicare Important Message Given:  Yes ? ? ? ? ?Levada Dy  Yony Roulston-Martin ?03/03/2022, 1:09 PM ?

## 2022-03-05 DIAGNOSIS — S72002D Fracture of unspecified part of neck of left femur, subsequent encounter for closed fracture with routine healing: Secondary | ICD-10-CM | POA: Diagnosis not present

## 2022-03-05 DIAGNOSIS — Z79899 Other long term (current) drug therapy: Secondary | ICD-10-CM | POA: Diagnosis not present

## 2022-03-05 DIAGNOSIS — E034 Atrophy of thyroid (acquired): Secondary | ICD-10-CM | POA: Diagnosis not present

## 2022-03-10 ENCOUNTER — Other Ambulatory Visit: Payer: Self-pay | Admitting: *Deleted

## 2022-03-10 NOTE — Patient Outreach (Signed)
Per [facility] eligible member currently resides in [facility] SN[facility].  Screening for potential [facility] Care Management services as a benefit of member's insurance plan. ? ?Mrs. Rawson admitted to SNF on 03/03/22 after hospitalization. ? ?Update received from [place], [place] SW indicating care plan meeting is scheduled tomorrow with the family.  ? ?Will continue to follow while member resides in SNF.  ? ? ? ?Raiford Noble, MSN, RN,BSN ?[facility] Post Acute Care Coordinator ?[phone] [facility]) ?[phone]  (Toll free office)   ?

## 2022-03-14 ENCOUNTER — Other Ambulatory Visit: Payer: Self-pay | Admitting: *Deleted

## 2022-03-14 NOTE — Patient Outreach (Addendum)
Per [place] eligible member currently resides in [facility].  Screened for potential [facility] Care Management services as a benefit of member's insurance plan. ? ?Tiffany Thornton admitted to SNF on [facility] after hospitalization. ? ?Facility site visit to [facility] skilled nursing facility. Met with Tiffany Thornton, SNF SW concerning transition plan and potential [facility] needs. Anticipated transition plan is return to [place] on this Friday or Saturday. Will have [place] home health. Tiffany Thornton was independent prior to admission.  ? ?Spoke with Tiffany Thornton  in room at [facility] SN[facility] to discuss transition plans and [facility] follow up. Explained [place] Management services. Tiffany Thornton is agreeable to [facility] follow up. Tiffany Thornton confirms she will return to ILF. States she has supportive family that assists with transportation to MD appointments and meals. Writer offered to contact son Tiffany Thornton) to discuss [facility] follow up as well. Tiffany Thornton declined, stating "Tiffany is in [place], no need to call him." ? ?Confirmed PCP is Tiffany Thornton with [facility] at [facility]. ?Confirmed best contact number for Tiffany Thornton is [phone]. ? ?Provided [facility] Care Management brochure, 24-hr nurse advice line magnet, and writer's contact information.  ? ?Will plan to make [place] Management referral post SNF for care coordination.  ? ? ?Tiffany Rolling, MSN, RN,BSN ?[place] Coordinator ?[phone] [facility]) ?[phone]  (Toll free office)  ? ? ? ? ?  ?

## 2022-03-15 DIAGNOSIS — S72002D Fracture of unspecified part of neck of left femur, subsequent encounter for closed fracture with routine healing: Secondary | ICD-10-CM | POA: Diagnosis not present

## 2022-03-19 DIAGNOSIS — R2681 Unsteadiness on feet: Secondary | ICD-10-CM | POA: Diagnosis not present

## 2022-03-19 DIAGNOSIS — R278 Other lack of coordination: Secondary | ICD-10-CM | POA: Diagnosis not present

## 2022-03-19 DIAGNOSIS — M6281 Muscle weakness (generalized): Secondary | ICD-10-CM | POA: Diagnosis not present

## 2022-03-19 DIAGNOSIS — S72002D Fracture of unspecified part of neck of left femur, subsequent encounter for closed fracture with routine healing: Secondary | ICD-10-CM | POA: Diagnosis not present

## 2022-03-19 DIAGNOSIS — Z471 Aftercare following joint replacement surgery: Secondary | ICD-10-CM | POA: Diagnosis not present

## 2022-03-21 DIAGNOSIS — R278 Other lack of coordination: Secondary | ICD-10-CM | POA: Diagnosis not present

## 2022-03-21 DIAGNOSIS — S72002D Fracture of unspecified part of neck of left femur, subsequent encounter for closed fracture with routine healing: Secondary | ICD-10-CM | POA: Diagnosis not present

## 2022-03-21 DIAGNOSIS — Z471 Aftercare following joint replacement surgery: Secondary | ICD-10-CM | POA: Diagnosis not present

## 2022-03-21 DIAGNOSIS — M6281 Muscle weakness (generalized): Secondary | ICD-10-CM | POA: Diagnosis not present

## 2022-03-21 DIAGNOSIS — R2681 Unsteadiness on feet: Secondary | ICD-10-CM | POA: Diagnosis not present

## 2022-03-22 ENCOUNTER — Other Ambulatory Visit: Payer: Self-pay | Admitting: *Deleted

## 2022-03-22 DIAGNOSIS — R2681 Unsteadiness on feet: Secondary | ICD-10-CM | POA: Diagnosis not present

## 2022-03-22 DIAGNOSIS — Z471 Aftercare following joint replacement surgery: Secondary | ICD-10-CM | POA: Diagnosis not present

## 2022-03-22 DIAGNOSIS — M6281 Muscle weakness (generalized): Secondary | ICD-10-CM | POA: Diagnosis not present

## 2022-03-22 DIAGNOSIS — S72002D Fracture of unspecified part of neck of left femur, subsequent encounter for closed fracture with routine healing: Secondary | ICD-10-CM | POA: Diagnosis not present

## 2022-03-22 DIAGNOSIS — R278 Other lack of coordination: Secondary | ICD-10-CM | POA: Diagnosis not present

## 2022-03-22 NOTE — Patient Outreach (Signed)
[facility] Post- Acute Care Coordinator follow up. Per [facility] eligible member currently resides in  [facility].   ? ?Writer made aware member's PCP at [place] [facility] has [facility] care management services available. Therefore, [facility] Care Management will not follow after all.  ? ?Update received from [facility] SN[facility] indicating family as opted for private pay for another week or 2 while member receives Part B benefits. Transition plan remains to return to [place]. ? ?Will send notification to [facility] team when member transitions home from SNF.  ? ? ?Raiford Noble, MSN, RN,BSN ?[facility] Post Acute Care Coordinator ?[phone] [facility]) ?[phone]  (Toll free office)  ?

## 2022-03-23 DIAGNOSIS — M6281 Muscle weakness (generalized): Secondary | ICD-10-CM | POA: Diagnosis not present

## 2022-03-23 DIAGNOSIS — R278 Other lack of coordination: Secondary | ICD-10-CM | POA: Diagnosis not present

## 2022-03-23 DIAGNOSIS — R2681 Unsteadiness on feet: Secondary | ICD-10-CM | POA: Diagnosis not present

## 2022-03-23 DIAGNOSIS — S72002D Fracture of unspecified part of neck of left femur, subsequent encounter for closed fracture with routine healing: Secondary | ICD-10-CM | POA: Diagnosis not present

## 2022-03-23 DIAGNOSIS — Z471 Aftercare following joint replacement surgery: Secondary | ICD-10-CM | POA: Diagnosis not present

## 2022-03-24 DIAGNOSIS — R2681 Unsteadiness on feet: Secondary | ICD-10-CM | POA: Diagnosis not present

## 2022-03-24 DIAGNOSIS — R278 Other lack of coordination: Secondary | ICD-10-CM | POA: Diagnosis not present

## 2022-03-24 DIAGNOSIS — Z471 Aftercare following joint replacement surgery: Secondary | ICD-10-CM | POA: Diagnosis not present

## 2022-03-24 DIAGNOSIS — M6281 Muscle weakness (generalized): Secondary | ICD-10-CM | POA: Diagnosis not present

## 2022-03-24 DIAGNOSIS — S72002D Fracture of unspecified part of neck of left femur, subsequent encounter for closed fracture with routine healing: Secondary | ICD-10-CM | POA: Diagnosis not present

## 2022-03-25 DIAGNOSIS — S72002D Fracture of unspecified part of neck of left femur, subsequent encounter for closed fracture with routine healing: Secondary | ICD-10-CM | POA: Diagnosis not present

## 2022-03-25 DIAGNOSIS — M6281 Muscle weakness (generalized): Secondary | ICD-10-CM | POA: Diagnosis not present

## 2022-03-25 DIAGNOSIS — R278 Other lack of coordination: Secondary | ICD-10-CM | POA: Diagnosis not present

## 2022-03-25 DIAGNOSIS — Z471 Aftercare following joint replacement surgery: Secondary | ICD-10-CM | POA: Diagnosis not present

## 2022-03-25 DIAGNOSIS — R2681 Unsteadiness on feet: Secondary | ICD-10-CM | POA: Diagnosis not present

## 2022-03-26 DIAGNOSIS — M6281 Muscle weakness (generalized): Secondary | ICD-10-CM | POA: Diagnosis not present

## 2022-03-26 DIAGNOSIS — S72002D Fracture of unspecified part of neck of left femur, subsequent encounter for closed fracture with routine healing: Secondary | ICD-10-CM | POA: Diagnosis not present

## 2022-03-26 DIAGNOSIS — Z471 Aftercare following joint replacement surgery: Secondary | ICD-10-CM | POA: Diagnosis not present

## 2022-03-26 DIAGNOSIS — R2681 Unsteadiness on feet: Secondary | ICD-10-CM | POA: Diagnosis not present

## 2022-03-26 DIAGNOSIS — R278 Other lack of coordination: Secondary | ICD-10-CM | POA: Diagnosis not present

## 2022-03-28 DIAGNOSIS — R278 Other lack of coordination: Secondary | ICD-10-CM | POA: Diagnosis not present

## 2022-03-28 DIAGNOSIS — S72002D Fracture of unspecified part of neck of left femur, subsequent encounter for closed fracture with routine healing: Secondary | ICD-10-CM | POA: Diagnosis not present

## 2022-03-28 DIAGNOSIS — R2681 Unsteadiness on feet: Secondary | ICD-10-CM | POA: Diagnosis not present

## 2022-03-28 DIAGNOSIS — Z471 Aftercare following joint replacement surgery: Secondary | ICD-10-CM | POA: Diagnosis not present

## 2022-03-28 DIAGNOSIS — M6281 Muscle weakness (generalized): Secondary | ICD-10-CM | POA: Diagnosis not present

## 2022-03-29 DIAGNOSIS — R2681 Unsteadiness on feet: Secondary | ICD-10-CM | POA: Diagnosis not present

## 2022-03-29 DIAGNOSIS — M6281 Muscle weakness (generalized): Secondary | ICD-10-CM | POA: Diagnosis not present

## 2022-03-29 DIAGNOSIS — Z471 Aftercare following joint replacement surgery: Secondary | ICD-10-CM | POA: Diagnosis not present

## 2022-03-29 DIAGNOSIS — S72002D Fracture of unspecified part of neck of left femur, subsequent encounter for closed fracture with routine healing: Secondary | ICD-10-CM | POA: Diagnosis not present

## 2022-03-29 DIAGNOSIS — R278 Other lack of coordination: Secondary | ICD-10-CM | POA: Diagnosis not present

## 2022-03-30 DIAGNOSIS — Z471 Aftercare following joint replacement surgery: Secondary | ICD-10-CM | POA: Diagnosis not present

## 2022-03-30 DIAGNOSIS — S72002D Fracture of unspecified part of neck of left femur, subsequent encounter for closed fracture with routine healing: Secondary | ICD-10-CM | POA: Diagnosis not present

## 2022-03-30 DIAGNOSIS — M6281 Muscle weakness (generalized): Secondary | ICD-10-CM | POA: Diagnosis not present

## 2022-03-30 DIAGNOSIS — R2681 Unsteadiness on feet: Secondary | ICD-10-CM | POA: Diagnosis not present

## 2022-03-30 DIAGNOSIS — R278 Other lack of coordination: Secondary | ICD-10-CM | POA: Diagnosis not present

## 2022-04-01 ENCOUNTER — Other Ambulatory Visit: Payer: Self-pay | Admitting: *Deleted

## 2022-04-01 DIAGNOSIS — E871 Hypo-osmolality and hyponatremia: Secondary | ICD-10-CM | POA: Diagnosis not present

## 2022-04-01 DIAGNOSIS — R1310 Dysphagia, unspecified: Secondary | ICD-10-CM | POA: Diagnosis not present

## 2022-04-01 DIAGNOSIS — G9389 Other specified disorders of brain: Secondary | ICD-10-CM | POA: Diagnosis not present

## 2022-04-01 DIAGNOSIS — I69398 Other sequelae of cerebral infarction: Secondary | ICD-10-CM | POA: Diagnosis not present

## 2022-04-01 DIAGNOSIS — I739 Peripheral vascular disease, unspecified: Secondary | ICD-10-CM | POA: Diagnosis not present

## 2022-04-01 DIAGNOSIS — R2689 Other abnormalities of gait and mobility: Secondary | ICD-10-CM | POA: Diagnosis not present

## 2022-04-01 DIAGNOSIS — E43 Unspecified severe protein-calorie malnutrition: Secondary | ICD-10-CM | POA: Diagnosis not present

## 2022-04-01 DIAGNOSIS — R278 Other lack of coordination: Secondary | ICD-10-CM | POA: Diagnosis not present

## 2022-04-01 DIAGNOSIS — I10 Essential (primary) hypertension: Secondary | ICD-10-CM | POA: Diagnosis not present

## 2022-04-01 DIAGNOSIS — S72002D Fracture of unspecified part of neck of left femur, subsequent encounter for closed fracture with routine healing: Secondary | ICD-10-CM | POA: Diagnosis not present

## 2022-04-01 DIAGNOSIS — Z96642 Presence of left artificial hip joint: Secondary | ICD-10-CM | POA: Diagnosis not present

## 2022-04-01 NOTE — Patient Outreach (Signed)
[facility] Post- Acute Care Coordinator follow up.  ? ?Verified in [facility] Mrs. Codrington transitioned from [facility] SN[facility] to ALF.  ? ?No identifiable post SNF care coordination needs.  ? ? ?Raiford Noble, MSN, RN,BSN ?[facility] Post Acute Care Coordinator ?[phone] [facility]) ?[phone]  (Toll free office)   ?

## 2022-04-05 DIAGNOSIS — I10 Essential (primary) hypertension: Secondary | ICD-10-CM | POA: Diagnosis not present

## 2022-04-05 DIAGNOSIS — I69398 Other sequelae of cerebral infarction: Secondary | ICD-10-CM | POA: Diagnosis not present

## 2022-04-05 DIAGNOSIS — I739 Peripheral vascular disease, unspecified: Secondary | ICD-10-CM | POA: Diagnosis not present

## 2022-04-05 DIAGNOSIS — S72002D Fracture of unspecified part of neck of left femur, subsequent encounter for closed fracture with routine healing: Secondary | ICD-10-CM | POA: Diagnosis not present

## 2022-04-05 DIAGNOSIS — Z96642 Presence of left artificial hip joint: Secondary | ICD-10-CM | POA: Diagnosis not present

## 2022-04-06 DIAGNOSIS — I10 Essential (primary) hypertension: Secondary | ICD-10-CM | POA: Diagnosis not present

## 2022-04-06 DIAGNOSIS — I739 Peripheral vascular disease, unspecified: Secondary | ICD-10-CM | POA: Diagnosis not present

## 2022-04-06 DIAGNOSIS — I69398 Other sequelae of cerebral infarction: Secondary | ICD-10-CM | POA: Diagnosis not present

## 2022-04-06 DIAGNOSIS — Z96642 Presence of left artificial hip joint: Secondary | ICD-10-CM | POA: Diagnosis not present

## 2022-04-06 DIAGNOSIS — S72002D Fracture of unspecified part of neck of left femur, subsequent encounter for closed fracture with routine healing: Secondary | ICD-10-CM | POA: Diagnosis not present

## 2022-04-08 DIAGNOSIS — Z96642 Presence of left artificial hip joint: Secondary | ICD-10-CM | POA: Diagnosis not present

## 2022-04-08 DIAGNOSIS — S72002D Fracture of unspecified part of neck of left femur, subsequent encounter for closed fracture with routine healing: Secondary | ICD-10-CM | POA: Diagnosis not present

## 2022-04-08 DIAGNOSIS — I10 Essential (primary) hypertension: Secondary | ICD-10-CM | POA: Diagnosis not present

## 2022-04-08 DIAGNOSIS — I739 Peripheral vascular disease, unspecified: Secondary | ICD-10-CM | POA: Diagnosis not present

## 2022-04-08 DIAGNOSIS — I69398 Other sequelae of cerebral infarction: Secondary | ICD-10-CM | POA: Diagnosis not present

## 2022-04-11 DIAGNOSIS — I10 Essential (primary) hypertension: Secondary | ICD-10-CM | POA: Diagnosis not present

## 2022-04-11 DIAGNOSIS — S72002D Fracture of unspecified part of neck of left femur, subsequent encounter for closed fracture with routine healing: Secondary | ICD-10-CM | POA: Diagnosis not present

## 2022-04-11 DIAGNOSIS — Z96642 Presence of left artificial hip joint: Secondary | ICD-10-CM | POA: Diagnosis not present

## 2022-04-11 DIAGNOSIS — I739 Peripheral vascular disease, unspecified: Secondary | ICD-10-CM | POA: Diagnosis not present

## 2022-04-11 DIAGNOSIS — I69398 Other sequelae of cerebral infarction: Secondary | ICD-10-CM | POA: Diagnosis not present

## 2022-04-12 DIAGNOSIS — I69398 Other sequelae of cerebral infarction: Secondary | ICD-10-CM | POA: Diagnosis not present

## 2022-04-12 DIAGNOSIS — Z96642 Presence of left artificial hip joint: Secondary | ICD-10-CM | POA: Diagnosis not present

## 2022-04-12 DIAGNOSIS — S72002D Fracture of unspecified part of neck of left femur, subsequent encounter for closed fracture with routine healing: Secondary | ICD-10-CM | POA: Diagnosis not present

## 2022-04-12 DIAGNOSIS — I739 Peripheral vascular disease, unspecified: Secondary | ICD-10-CM | POA: Diagnosis not present

## 2022-04-12 DIAGNOSIS — I10 Essential (primary) hypertension: Secondary | ICD-10-CM | POA: Diagnosis not present

## 2022-04-13 DIAGNOSIS — Z96642 Presence of left artificial hip joint: Secondary | ICD-10-CM | POA: Diagnosis not present

## 2022-04-13 DIAGNOSIS — S72002D Fracture of unspecified part of neck of left femur, subsequent encounter for closed fracture with routine healing: Secondary | ICD-10-CM | POA: Diagnosis not present

## 2022-04-13 DIAGNOSIS — I69398 Other sequelae of cerebral infarction: Secondary | ICD-10-CM | POA: Diagnosis not present

## 2022-04-13 DIAGNOSIS — I10 Essential (primary) hypertension: Secondary | ICD-10-CM | POA: Diagnosis not present

## 2022-04-13 DIAGNOSIS — I739 Peripheral vascular disease, unspecified: Secondary | ICD-10-CM | POA: Diagnosis not present

## 2022-04-15 DIAGNOSIS — R7303 Prediabetes: Secondary | ICD-10-CM | POA: Diagnosis not present

## 2022-04-15 DIAGNOSIS — Z9889 Other specified postprocedural states: Secondary | ICD-10-CM | POA: Diagnosis not present

## 2022-04-15 DIAGNOSIS — E039 Hypothyroidism, unspecified: Secondary | ICD-10-CM | POA: Diagnosis not present

## 2022-04-15 DIAGNOSIS — E7800 Pure hypercholesterolemia, unspecified: Secondary | ICD-10-CM | POA: Diagnosis not present

## 2022-04-15 DIAGNOSIS — I10 Essential (primary) hypertension: Secondary | ICD-10-CM | POA: Diagnosis not present

## 2022-04-15 DIAGNOSIS — I700 Atherosclerosis of aorta: Secondary | ICD-10-CM | POA: Diagnosis not present

## 2022-04-15 DIAGNOSIS — N183 Chronic kidney disease, stage 3 unspecified: Secondary | ICD-10-CM | POA: Diagnosis not present

## 2022-04-15 DIAGNOSIS — S72002A Fracture of unspecified part of neck of left femur, initial encounter for closed fracture: Secondary | ICD-10-CM | POA: Diagnosis not present

## 2022-04-15 DIAGNOSIS — R269 Unspecified abnormalities of gait and mobility: Secondary | ICD-10-CM | POA: Diagnosis not present

## 2022-04-15 DIAGNOSIS — Z8673 Personal history of transient ischemic attack (TIA), and cerebral infarction without residual deficits: Secondary | ICD-10-CM | POA: Diagnosis not present

## 2022-04-15 DIAGNOSIS — I6930 Unspecified sequelae of cerebral infarction: Secondary | ICD-10-CM | POA: Diagnosis not present

## 2022-04-18 DIAGNOSIS — I69398 Other sequelae of cerebral infarction: Secondary | ICD-10-CM | POA: Diagnosis not present

## 2022-04-18 DIAGNOSIS — I10 Essential (primary) hypertension: Secondary | ICD-10-CM | POA: Diagnosis not present

## 2022-04-18 DIAGNOSIS — Z96642 Presence of left artificial hip joint: Secondary | ICD-10-CM | POA: Diagnosis not present

## 2022-04-18 DIAGNOSIS — S72002D Fracture of unspecified part of neck of left femur, subsequent encounter for closed fracture with routine healing: Secondary | ICD-10-CM | POA: Diagnosis not present

## 2022-04-18 DIAGNOSIS — I739 Peripheral vascular disease, unspecified: Secondary | ICD-10-CM | POA: Diagnosis not present

## 2022-04-19 DIAGNOSIS — Z96642 Presence of left artificial hip joint: Secondary | ICD-10-CM | POA: Diagnosis not present

## 2022-04-19 DIAGNOSIS — I739 Peripheral vascular disease, unspecified: Secondary | ICD-10-CM | POA: Diagnosis not present

## 2022-04-19 DIAGNOSIS — I69398 Other sequelae of cerebral infarction: Secondary | ICD-10-CM | POA: Diagnosis not present

## 2022-04-19 DIAGNOSIS — S72002D Fracture of unspecified part of neck of left femur, subsequent encounter for closed fracture with routine healing: Secondary | ICD-10-CM | POA: Diagnosis not present

## 2022-04-19 DIAGNOSIS — I10 Essential (primary) hypertension: Secondary | ICD-10-CM | POA: Diagnosis not present

## 2022-04-20 DIAGNOSIS — S72002D Fracture of unspecified part of neck of left femur, subsequent encounter for closed fracture with routine healing: Secondary | ICD-10-CM | POA: Diagnosis not present

## 2022-04-20 DIAGNOSIS — I10 Essential (primary) hypertension: Secondary | ICD-10-CM | POA: Diagnosis not present

## 2022-04-20 DIAGNOSIS — Z96642 Presence of left artificial hip joint: Secondary | ICD-10-CM | POA: Diagnosis not present

## 2022-04-20 DIAGNOSIS — I69398 Other sequelae of cerebral infarction: Secondary | ICD-10-CM | POA: Diagnosis not present

## 2022-04-20 DIAGNOSIS — I739 Peripheral vascular disease, unspecified: Secondary | ICD-10-CM | POA: Diagnosis not present

## 2022-04-27 DIAGNOSIS — I739 Peripheral vascular disease, unspecified: Secondary | ICD-10-CM | POA: Diagnosis not present

## 2022-04-27 DIAGNOSIS — I10 Essential (primary) hypertension: Secondary | ICD-10-CM | POA: Diagnosis not present

## 2022-04-27 DIAGNOSIS — Z96642 Presence of left artificial hip joint: Secondary | ICD-10-CM | POA: Diagnosis not present

## 2022-04-27 DIAGNOSIS — I69398 Other sequelae of cerebral infarction: Secondary | ICD-10-CM | POA: Diagnosis not present

## 2022-04-27 DIAGNOSIS — S72002D Fracture of unspecified part of neck of left femur, subsequent encounter for closed fracture with routine healing: Secondary | ICD-10-CM | POA: Diagnosis not present

## 2022-05-05 DIAGNOSIS — S72002D Fracture of unspecified part of neck of left femur, subsequent encounter for closed fracture with routine healing: Secondary | ICD-10-CM | POA: Diagnosis not present

## 2022-05-05 DIAGNOSIS — I739 Peripheral vascular disease, unspecified: Secondary | ICD-10-CM | POA: Diagnosis not present

## 2022-05-05 DIAGNOSIS — I10 Essential (primary) hypertension: Secondary | ICD-10-CM | POA: Diagnosis not present

## 2022-05-05 DIAGNOSIS — I69398 Other sequelae of cerebral infarction: Secondary | ICD-10-CM | POA: Diagnosis not present

## 2022-05-05 DIAGNOSIS — Z96642 Presence of left artificial hip joint: Secondary | ICD-10-CM | POA: Diagnosis not present

## 2022-05-06 DIAGNOSIS — S72002D Fracture of unspecified part of neck of left femur, subsequent encounter for closed fracture with routine healing: Secondary | ICD-10-CM | POA: Diagnosis not present

## 2022-05-06 DIAGNOSIS — I10 Essential (primary) hypertension: Secondary | ICD-10-CM | POA: Diagnosis not present

## 2022-05-06 DIAGNOSIS — I739 Peripheral vascular disease, unspecified: Secondary | ICD-10-CM | POA: Diagnosis not present

## 2022-05-06 DIAGNOSIS — Z96642 Presence of left artificial hip joint: Secondary | ICD-10-CM | POA: Diagnosis not present

## 2022-05-06 DIAGNOSIS — I69398 Other sequelae of cerebral infarction: Secondary | ICD-10-CM | POA: Diagnosis not present

## 2022-05-12 DIAGNOSIS — Z96642 Presence of left artificial hip joint: Secondary | ICD-10-CM | POA: Diagnosis not present

## 2022-05-12 DIAGNOSIS — S72002D Fracture of unspecified part of neck of left femur, subsequent encounter for closed fracture with routine healing: Secondary | ICD-10-CM | POA: Diagnosis not present

## 2022-05-12 DIAGNOSIS — I739 Peripheral vascular disease, unspecified: Secondary | ICD-10-CM | POA: Diagnosis not present

## 2022-05-12 DIAGNOSIS — I10 Essential (primary) hypertension: Secondary | ICD-10-CM | POA: Diagnosis not present

## 2022-05-12 DIAGNOSIS — I69398 Other sequelae of cerebral infarction: Secondary | ICD-10-CM | POA: Diagnosis not present

## 2022-05-19 DIAGNOSIS — I10 Essential (primary) hypertension: Secondary | ICD-10-CM | POA: Diagnosis not present

## 2022-05-19 DIAGNOSIS — I69398 Other sequelae of cerebral infarction: Secondary | ICD-10-CM | POA: Diagnosis not present

## 2022-05-19 DIAGNOSIS — I739 Peripheral vascular disease, unspecified: Secondary | ICD-10-CM | POA: Diagnosis not present

## 2022-05-19 DIAGNOSIS — Z96642 Presence of left artificial hip joint: Secondary | ICD-10-CM | POA: Diagnosis not present

## 2022-05-19 DIAGNOSIS — S72002D Fracture of unspecified part of neck of left femur, subsequent encounter for closed fracture with routine healing: Secondary | ICD-10-CM | POA: Diagnosis not present

## 2022-05-26 DIAGNOSIS — I739 Peripheral vascular disease, unspecified: Secondary | ICD-10-CM | POA: Diagnosis not present

## 2022-05-26 DIAGNOSIS — I10 Essential (primary) hypertension: Secondary | ICD-10-CM | POA: Diagnosis not present

## 2022-05-26 DIAGNOSIS — S72002D Fracture of unspecified part of neck of left femur, subsequent encounter for closed fracture with routine healing: Secondary | ICD-10-CM | POA: Diagnosis not present

## 2022-05-26 DIAGNOSIS — I69398 Other sequelae of cerebral infarction: Secondary | ICD-10-CM | POA: Diagnosis not present

## 2022-05-26 DIAGNOSIS — Z96642 Presence of left artificial hip joint: Secondary | ICD-10-CM | POA: Diagnosis not present

## 2022-12-16 DIAGNOSIS — I700 Atherosclerosis of aorta: Secondary | ICD-10-CM | POA: Diagnosis not present

## 2022-12-16 DIAGNOSIS — R269 Unspecified abnormalities of gait and mobility: Secondary | ICD-10-CM | POA: Diagnosis not present

## 2022-12-16 DIAGNOSIS — Z0000 Encounter for general adult medical examination without abnormal findings: Secondary | ICD-10-CM | POA: Diagnosis not present

## 2022-12-16 DIAGNOSIS — I6930 Unspecified sequelae of cerebral infarction: Secondary | ICD-10-CM | POA: Diagnosis not present

## 2022-12-16 DIAGNOSIS — E039 Hypothyroidism, unspecified: Secondary | ICD-10-CM | POA: Diagnosis not present

## 2022-12-16 DIAGNOSIS — R251 Tremor, unspecified: Secondary | ICD-10-CM | POA: Diagnosis not present

## 2022-12-16 DIAGNOSIS — Z8673 Personal history of transient ischemic attack (TIA), and cerebral infarction without residual deficits: Secondary | ICD-10-CM | POA: Diagnosis not present

## 2022-12-16 DIAGNOSIS — N183 Chronic kidney disease, stage 3 unspecified: Secondary | ICD-10-CM | POA: Diagnosis not present

## 2022-12-16 DIAGNOSIS — I10 Essential (primary) hypertension: Secondary | ICD-10-CM | POA: Diagnosis not present

## 2022-12-16 DIAGNOSIS — R7303 Prediabetes: Secondary | ICD-10-CM | POA: Diagnosis not present

## 2022-12-16 DIAGNOSIS — E7800 Pure hypercholesterolemia, unspecified: Secondary | ICD-10-CM | POA: Diagnosis not present

## 2023-06-23 DIAGNOSIS — E039 Hypothyroidism, unspecified: Secondary | ICD-10-CM | POA: Diagnosis not present

## 2023-06-23 DIAGNOSIS — I10 Essential (primary) hypertension: Secondary | ICD-10-CM | POA: Diagnosis not present

## 2023-06-23 DIAGNOSIS — R7303 Prediabetes: Secondary | ICD-10-CM | POA: Diagnosis not present

## 2023-06-23 DIAGNOSIS — I700 Atherosclerosis of aorta: Secondary | ICD-10-CM | POA: Diagnosis not present

## 2023-06-23 DIAGNOSIS — Z8673 Personal history of transient ischemic attack (TIA), and cerebral infarction without residual deficits: Secondary | ICD-10-CM | POA: Diagnosis not present

## 2023-06-23 DIAGNOSIS — Z9989 Dependence on other enabling machines and devices: Secondary | ICD-10-CM | POA: Diagnosis not present

## 2023-06-23 DIAGNOSIS — R251 Tremor, unspecified: Secondary | ICD-10-CM | POA: Diagnosis not present

## 2023-06-23 DIAGNOSIS — I6930 Unspecified sequelae of cerebral infarction: Secondary | ICD-10-CM | POA: Diagnosis not present

## 2023-06-23 DIAGNOSIS — D692 Other nonthrombocytopenic purpura: Secondary | ICD-10-CM | POA: Diagnosis not present

## 2023-06-23 DIAGNOSIS — N183 Chronic kidney disease, stage 3 unspecified: Secondary | ICD-10-CM | POA: Diagnosis not present

## 2023-06-23 DIAGNOSIS — E7800 Pure hypercholesterolemia, unspecified: Secondary | ICD-10-CM | POA: Diagnosis not present

## 2023-06-26 DIAGNOSIS — R269 Unspecified abnormalities of gait and mobility: Secondary | ICD-10-CM | POA: Diagnosis not present

## 2023-06-30 DIAGNOSIS — R269 Unspecified abnormalities of gait and mobility: Secondary | ICD-10-CM | POA: Diagnosis not present

## 2023-07-05 DIAGNOSIS — R269 Unspecified abnormalities of gait and mobility: Secondary | ICD-10-CM | POA: Diagnosis not present

## 2023-08-02 DIAGNOSIS — R269 Unspecified abnormalities of gait and mobility: Secondary | ICD-10-CM | POA: Diagnosis not present

## 2024-05-21 DIAGNOSIS — L570 Actinic keratosis: Secondary | ICD-10-CM | POA: Diagnosis not present

## 2024-05-21 DIAGNOSIS — L821 Other seborrheic keratosis: Secondary | ICD-10-CM | POA: Diagnosis not present

## 2024-05-21 DIAGNOSIS — D0472 Carcinoma in situ of skin of left lower limb, including hip: Secondary | ICD-10-CM | POA: Diagnosis not present

## 2024-07-19 DIAGNOSIS — N1831 Chronic kidney disease, stage 3a: Secondary | ICD-10-CM | POA: Diagnosis not present

## 2024-07-19 DIAGNOSIS — I872 Venous insufficiency (chronic) (peripheral): Secondary | ICD-10-CM | POA: Diagnosis not present

## 2024-07-19 DIAGNOSIS — E782 Mixed hyperlipidemia: Secondary | ICD-10-CM | POA: Diagnosis not present

## 2024-07-19 DIAGNOSIS — I10 Essential (primary) hypertension: Secondary | ICD-10-CM | POA: Diagnosis not present

## 2024-07-19 DIAGNOSIS — I2510 Atherosclerotic heart disease of native coronary artery without angina pectoris: Secondary | ICD-10-CM | POA: Diagnosis not present

## 2024-07-19 DIAGNOSIS — R7303 Prediabetes: Secondary | ICD-10-CM | POA: Diagnosis not present

## 2024-07-19 DIAGNOSIS — E039 Hypothyroidism, unspecified: Secondary | ICD-10-CM | POA: Diagnosis not present

## 2024-11-07 ENCOUNTER — Ambulatory Visit
Admission: RE | Admit: 2024-11-07 | Discharge: 2024-11-07 | Disposition: A | Discharge status: Home / Self Care | Source: Ambulatory Visit | Attending: Family Medicine | Admitting: Family Medicine

## 2024-11-07 ENCOUNTER — Other Ambulatory Visit: Payer: Self-pay | Admitting: Family Medicine

## 2024-11-07 DIAGNOSIS — R0609 Other forms of dyspnea: Secondary | ICD-10-CM

## 2024-11-08 ENCOUNTER — Observation Stay (HOSPITAL_COMMUNITY): Admission: EM | Admit: 2024-11-08 | Discharge: 2024-11-09 | Disposition: A | Source: Ambulatory Visit

## 2024-11-08 ENCOUNTER — Other Ambulatory Visit: Payer: Self-pay

## 2024-11-08 ENCOUNTER — Encounter (HOSPITAL_COMMUNITY): Payer: Self-pay | Admitting: Student

## 2024-11-08 DIAGNOSIS — Z8679 Personal history of other diseases of the circulatory system: Secondary | ICD-10-CM | POA: Insufficient documentation

## 2024-11-08 DIAGNOSIS — E039 Hypothyroidism, unspecified: Secondary | ICD-10-CM | POA: Insufficient documentation

## 2024-11-08 DIAGNOSIS — F419 Anxiety disorder, unspecified: Secondary | ICD-10-CM | POA: Diagnosis not present

## 2024-11-08 DIAGNOSIS — E785 Hyperlipidemia, unspecified: Secondary | ICD-10-CM | POA: Insufficient documentation

## 2024-11-08 DIAGNOSIS — Z91040 Latex allergy status: Secondary | ICD-10-CM | POA: Diagnosis not present

## 2024-11-08 DIAGNOSIS — Z7982 Long term (current) use of aspirin: Secondary | ICD-10-CM | POA: Diagnosis not present

## 2024-11-08 DIAGNOSIS — D508 Other iron deficiency anemias: Principal | ICD-10-CM | POA: Insufficient documentation

## 2024-11-08 DIAGNOSIS — F17210 Nicotine dependence, cigarettes, uncomplicated: Secondary | ICD-10-CM | POA: Insufficient documentation

## 2024-11-08 DIAGNOSIS — I10 Essential (primary) hypertension: Secondary | ICD-10-CM | POA: Insufficient documentation

## 2024-11-08 DIAGNOSIS — Z79890 Hormone replacement therapy (postmenopausal): Secondary | ICD-10-CM | POA: Insufficient documentation

## 2024-11-08 DIAGNOSIS — Z79899 Other long term (current) drug therapy: Secondary | ICD-10-CM | POA: Insufficient documentation

## 2024-11-08 DIAGNOSIS — R0602 Shortness of breath: Secondary | ICD-10-CM | POA: Diagnosis present

## 2024-11-08 DIAGNOSIS — Z8673 Personal history of transient ischemic attack (TIA), and cerebral infarction without residual deficits: Secondary | ICD-10-CM | POA: Insufficient documentation

## 2024-11-08 DIAGNOSIS — Z96643 Presence of artificial hip joint, bilateral: Secondary | ICD-10-CM | POA: Insufficient documentation

## 2024-11-08 DIAGNOSIS — D649 Anemia, unspecified: Secondary | ICD-10-CM | POA: Diagnosis present

## 2024-11-08 LAB — CBC
HCT: 20 % — ABNORMAL LOW (ref 36.0–46.0)
Hemoglobin: 5.5 g/dL — CL (ref 12.0–15.0)
MCH: 19.2 pg — ABNORMAL LOW (ref 26.0–34.0)
MCHC: 27.5 g/dL — ABNORMAL LOW (ref 30.0–36.0)
MCV: 69.9 fL — ABNORMAL LOW (ref 80.0–100.0)
Platelets: 449 K/uL — ABNORMAL HIGH (ref 150–400)
RBC: 2.86 MIL/uL — ABNORMAL LOW (ref 3.87–5.11)
RDW: 19.1 % — ABNORMAL HIGH (ref 11.5–15.5)
WBC: 8.4 K/uL (ref 4.0–10.5)
nRBC: 0 % (ref 0.0–0.2)

## 2024-11-08 LAB — COMPREHENSIVE METABOLIC PANEL WITH GFR
ALT: 8 U/L (ref 0–44)
AST: 25 U/L (ref 15–41)
Albumin: 4.2 g/dL (ref 3.5–5.0)
Alkaline Phosphatase: 84 U/L (ref 38–126)
Anion gap: 13 (ref 5–15)
BUN: 16 mg/dL (ref 8–23)
CO2: 23 mmol/L (ref 22–32)
Calcium: 9.1 mg/dL (ref 8.9–10.3)
Chloride: 104 mmol/L (ref 98–111)
Creatinine, Ser: 0.87 mg/dL (ref 0.44–1.00)
GFR, Estimated: >60 mL/min (ref >60)
Glucose, Bld: 111 mg/dL — ABNORMAL HIGH (ref 70–99)
Potassium: 4.5 mmol/L (ref 3.5–5.1)
Sodium: 139 mmol/L (ref 135–145)
Total Bilirubin: 0.2 mg/dL (ref 0.0–1.2)
Total Protein: 7.1 g/dL (ref 6.5–8.1)

## 2024-11-08 LAB — IRON AND TIBC
Iron: <10 ug/dL — ABNORMAL LOW (ref 28–170)
TIBC: 423 ug/dL (ref 250–450)

## 2024-11-08 LAB — PROTIME-INR
INR: 1.1 (ref 0.8–1.2)
Prothrombin Time: 14.4 s (ref 11.4–15.2)

## 2024-11-08 LAB — APTT: aPTT: 32 s (ref 24–36)

## 2024-11-08 LAB — PREPARE RBC (CROSSMATCH)

## 2024-11-08 MED ORDER — METOPROLOL TARTRATE 25 MG PO TABS
25.0000 mg | ORAL_TABLET | Freq: Two times a day (BID) | ORAL | Status: DC
  Administered 2024-11-08: 25 mg via ORAL
  Filled 2024-11-08 (×2): qty 1

## 2024-11-08 MED ORDER — AMLODIPINE BESYLATE 5 MG PO TABS
5.0000 mg | ORAL_TABLET | Freq: Every day | ORAL | Status: DC
  Administered 2024-11-09: 5 mg via ORAL
  Filled 2024-11-08: qty 1

## 2024-11-08 MED ORDER — SODIUM CHLORIDE 0.9% IV SOLUTION: Freq: Once | INTRAVENOUS | Status: AC

## 2024-11-08 MED ORDER — PANTOPRAZOLE SODIUM 40 MG IV SOLR
40.0000 mg | Freq: Two times a day (BID) | INTRAVENOUS | Status: DC
  Administered 2024-11-08 – 2024-11-09 (×2): 40 mg via INTRAVENOUS
  Filled 2024-11-08 (×2): qty 10

## 2024-11-08 MED ORDER — LEVOTHYROXINE SODIUM 100 MCG PO TABS
100.0000 ug | ORAL_TABLET | Freq: Every day | ORAL | Status: DC
  Administered 2024-11-09: 100 ug via ORAL
  Filled 2024-11-08: qty 1

## 2024-11-08 MED ORDER — ACETAMINOPHEN 325 MG PO TABS
650.0000 mg | ORAL_TABLET | Freq: Four times a day (QID) | ORAL | Status: DC | PRN
  Administered 2024-11-09: 650 mg via ORAL
  Filled 2024-11-08: qty 2

## 2024-11-08 MED ADMIN — Rosuvastatin Calcium Tab 5 MG: 5 mg | ORAL | NDC 65862029390

## 2024-11-08 MED FILL — Rosuvastatin Calcium Tab 5 MG: 5.0000 mg | ORAL | Qty: 1 | Status: AC

## 2024-11-08 NOTE — Plan of Care (Signed)

## 2024-11-08 NOTE — ED Provider Notes (Signed)
 [place] EMERGENCY DEPARTMENT AT [facility] Provider Note   CSN: [id number] Arrival date & time: 11/08/24  0945     Patient presents with: Abnormal Lab   Tiffany Thornton is a 89 y.o. female.    Abnormal Lab  States that she was told to come to the ED by her primary care physician because of anemia.  Patient states that for the past month or month and a half she has noticed that she has been short of breath with exertion.  Weak.  Feels fatigued.  She does not have any kind of chest pain.  Just feels very fatigued during her daily chores.  She has not noticed any kind of bleeding episodes.  Takes aspirin  but otherwise no anticoagulation use.  No melena or hematochezia.  No history of GI bleed in the past.  Patient states that she had received blood back in 1970 but she is not sure why.  No history of GI bleeds.  Has not been have any kind abdominal pain.  No skin color changes.  Otherwise denies all complaints.    Previous medical history reviewed : Patient last admitted in April 2023.  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  Patient was admitted in the setting because of transcervical fracture of the left femoral neck.  Hemiarthroplasty on the left side.   Prior to Admission medications  Medication Sig Start Date End Date Taking? Authorizing Provider  acetaminophen  (TYLENOL ) 325 MG tablet Take 2 tablets (650 mg total) by mouth every 6 (six) hours as needed for mild pain, fever or headache. 03/03/22  Yes Dahal, Chapman, MD  amLODipine  (NORVASC ) 5 MG tablet Take 1 tablet (5 mg total) by mouth daily. Patient taking differently: Take 5 mg by mouth every morning. 10/29/21  Yes Medina-Vargas, Monina C, NP  aspirin  EC 81 MG tablet Take 81 mg by mouth 2 (two) times a week.   Yes [provider]  hydrALAZINE  (APRESOLINE ) 25 MG tablet Take 1 tablet (25 mg total) by mouth every 8 (eight) hours. Patient taking differently: Take 25 mg by mouth See  admin instructions. Take 25 mg by mouth at 8 AM, 12:30 PM, and 8 PM 10/29/21  Yes Medina-Vargas, Monina C, NP  levothyroxine  (SYNTHROID ) 100 MCG tablet Take 100 mcg by mouth daily before breakfast. 02/07/22  Yes [provider]  metoprolol  tartrate (LOPRESSOR ) 25 MG tablet Take 1 tablet (25 mg total) by mouth 2 (two) times daily. Patient taking differently: Take 25 mg by mouth See admin instructions. Take 25 mg by mouth at 4 AM and 4 PM 10/29/21  Yes Medina-Vargas, Monina C, NP  Multiple Vitamin (MULTIVITAMIN WITH MINERALS) TABS tablet Take 1 tablet by mouth daily at 2 PM.   Yes [provider]  rosuvastatin  (CRESTOR ) 5 MG tablet Take 5 mg by mouth at bedtime. 02/04/22  Yes [provider]  enoxaparin  (LOVENOX ) 40 MG/0.4ML injection Inject 0.4 mLs (40 mg total) into the skin daily for 28 days. Patient not taking: Reported on 11/08/2024 03/04/22 11/08/24  Arlice Chapman, MD  senna-docusate (SENOKOT-S) 8.6-50 MG tablet Take 1 tablet by mouth 2 (two) times daily as needed for mild constipation. Patient not taking: Reported on 11/08/2024 10/12/21   Drusilla Sabas GORMAN, MD    Allergies: Atorvastatin, Citalopram hydrobromide, Codeine, Fish oil, Hydrochlorothiazide , Latex, Lovastatin, Rosuvastatin , and Scopolamine     Review of Systems  Constitutional:  Negative for chills and fever.  HENT:  Negative for ear pain and sore  throat.   Eyes:  Negative for pain and visual disturbance.  Respiratory:  Negative for cough and shortness of breath.   Cardiovascular:  Negative for chest pain and palpitations.  Gastrointestinal:  Negative for abdominal pain and vomiting.  Genitourinary:  Negative for dysuria and hematuria.  Musculoskeletal:  Negative for arthralgias and back pain.  Skin:  Negative for color change and rash.  Neurological:  Negative for seizures and syncope.  All other systems reviewed and are negative.   Updated Vital Signs BP (!) 173/81 (BP Location: Left Arm)   Pulse 81    Temp 97.8 F (36.6 C) (Oral)   Resp 16   SpO2 98%   Physical Exam Vitals and nursing note reviewed.  Constitutional:      General: She is not in acute distress.    Appearance: She is well-developed.  HENT:     Head: Normocephalic and atraumatic.  Eyes:     Conjunctiva/sclera: Conjunctivae normal.  Cardiovascular:     Rate and Rhythm: Normal rate and regular rhythm.     Heart sounds: No murmur heard. Pulmonary:     Effort: Pulmonary effort is normal. No respiratory distress.     Breath sounds: Normal breath sounds.  Abdominal:     Palpations: Abdomen is soft.     Tenderness: There is no abdominal tenderness.  Musculoskeletal:        General: No swelling.     Cervical back: Neck supple.  Skin:    General: Skin is warm and dry.     Capillary Refill: Capillary refill takes less than 2 seconds.  Neurological:     Mental Status: She is alert.  Psychiatric:        Mood and Affect: Mood normal.     (all labs ordered are listed, but only abnormal results are displayed) Labs Reviewed  COMPREHENSIVE METABOLIC PANEL WITH GFR - Abnormal; Notable for the following components:      Result Value   Glucose, Bld 111 (*)    All other components within normal limits  CBC - Abnormal; Notable for the following components:   RBC 2.86 (*)    Hemoglobin 5.5 (*)    HCT 20.0 (*)    MCV 69.9 (*)    MCH 19.2 (*)    MCHC 27.5 (*)    RDW 19.1 (*)    Platelets 449 (*)    All other components within normal limits  IRON AND TIBC - Abnormal; Notable for the following components:   Iron <10 (*)    All other components within normal limits  PROTIME-INR  APTT  POC OCCULT BLOOD, ED  TYPE AND SCREEN  PREPARE RBC (CROSSMATCH)    EKG: EKG Interpretation Date/Time:  Friday November 08 2024 10:37:57 EST Ventricular Rate:  78 PR Interval:  205 QRS Duration:  81 QT Interval:  382 QTC Calculation: 436 R Axis:   82  Text Interpretation: Sinus rhythm Supraventricular bigeminy Borderline right  axis deviation Confirmed by Simon Rea [phone]) on 11/08/2024 4:53:13 PM  Radiology: [facility] Chest 2 View Result Date: 11/07/2024 CLINICAL DATA:  Dyspnea on exertion EXAM: DG CHEST 2V COMPARISON:  02/28/2022 FINDINGS: Suspicion of small left-sided pleural effusion. No focal opacity. Normal cardiac size. Aortic atherosclerosis IMPRESSION: Suspicion of small left-sided pleural effusion. Electronically Signed   By: Luke Bun M.D.   On: 11/07/2024 17:03     Procedures   Medications Ordered in the ED  acetaminophen  (TYLENOL ) tablet 650 mg (has no administration in time range)  amLODipine  (  NORVASC ) tablet 5 mg (has no administration in time range)  metoprolol  tartrate (LOPRESSOR ) tablet 25 mg (25 mg Oral Given 11/08/24 1636)  levothyroxine  (SYNTHROID ) tablet 100 mcg (has no administration in time range)  pantoprazole (PROTONIX) injection 40 mg (40 mg Intravenous Given 11/08/24 1636)  0.9 %  sodium chloride  infusion (Manually program via Guardrails IV Fluids) ( Intravenous New Bag/Given 11/08/24 1330)                                    Medical Decision Making Amount and/or Complexity of Data Reviewed Labs: ordered.  Risk Prescription drug management. Decision regarding hospitalization.     HPI:  States that she was told to come to the ED by her primary care physician because of anemia.  Patient states that for the past month or month and a half she has noticed that she has been short of breath with exertion.  Weak.  Feels fatigued.  She does not have any kind of chest pain.  Just feels very fatigued during her daily chores.  She has not noticed any kind of bleeding episodes.  Takes aspirin  but otherwise no anticoagulation use.  No melena or hematochezia.  No history of GI bleed in the past.  Patient states that she had received blood back in 1970 but she is not sure why.  No history of GI bleeds.  Has not been have any kind abdominal pain.  No skin color changes.  Otherwise denies all  complaints.    Previous medical history reviewed : Patient last admitted in April 2023.  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  Patient was admitted in the setting because of transcervical fracture of the left femoral neck.  Hemiarthroplasty on the left side.  MDM:   Upon exam, patient ANO x 3 with GCS 15.  Will obtain hemoglobin to assess for any kind of anemia.  Reports possible history of iron deficiency.  Will obtain total iron binding capacity panel to assess for iron deficiency.  No reports of bright red blood per rectum or dark stools.  Soft and benign abdomen.  Will check patient's hemoglobin levels as well as iron levels before doing a rectal exam.  Only on aspirin .  No other anticoagulation use.  Will check INR today.  LFTs.  Reevaluation:   Upon reexamination, patient hemodynamically stable.  Remains A&O x 3 with GCS 15.  Check labs.  Hemoglobin 5.5.  Microcytic anemia.  Likely iron deficiency.  Total iron binding capacity shows iron levels less than 10.  Ordered 2 units packed red blood cells to be transfused.  Very little concern for GI bleed this point time.  Medicine will need to speak to gastroenterology given that patient is on aspirin .  Gastroenterology will see tomorrow.  No intervention needed at this time.  Patient be admitted for further evaluation.     Interventions: 2 u PRBC  EKG Interpreted by Me: sinus    Cardiac Tele Interpreted by Me: sinus     Social Determinant of Health: denies alcohol    Disposition and Follow Up: admit   CRITICAL CARE Performed by: Lavonia LOISE Pat   Total critical care time: 45 minutes  Critical care time was exclusive of separately billable procedures and treating other patients.  Critical care was necessary to treat or prevent imminent or life-threatening deterioration.  Critical care was time spent personally by me on the  following activities: development of treatment plan with patient  and/or surrogate as well as nursing, discussions with consultants, evaluation of patient's response to treatment, examination of patient, obtaining history from patient or surrogate, ordering and performing treatments and interventions, ordering and review of laboratory studies, ordering and review of radiographic studies, pulse oximetry and re-evaluation of patient's condition.       Final diagnoses:  Symptomatic anemia  Other iron deficiency anemia    ED Discharge Orders     None          Simon Lavonia SAILOR, MD 11/08/24 [phone]

## 2024-11-08 NOTE — Progress Notes (Signed)
 PT Cancellation Note  Patient Details Name: Tiffany Thornton MRN: [id number] DOB: [birth year]   Cancelled Treatment:    Reason Eval/Treat Not Completed: Medical issues which prohibited therapy Pt just admitted (7hr) with anemia, hgb is 5.5, will f/u when medically stable.  Benjiman, PT Acute Rehab [facility] [phone]   Benjiman VEAR [place] 11/08/2024, 5:03 PM

## 2024-11-08 NOTE — ED Triage Notes (Signed)
 Patient in today reporting anemia. Reports received call from pcp about low hemoglobin. Hx of same. Reports feeling tired and lethargic daily when walking. Denies blood in stool.

## 2024-11-08 NOTE — H&P (Signed)
 History and Physical    Patient: Tiffany Thornton FMW:[id number] DOB: [birth year] DOA: 11/08/2024 DOS: the patient was seen and examined on 11/08/2024 PCP: [name], DO  Patient coming from: Home.  [employer].  Uses rollator.  Chief Complaint:  Chief Complaint  Patient presents with   Abnormal Lab   HPI: Tiffany Thornton is a 89 y.o. 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  History provided by patient.  Patient son at bedside.  Patient reports generalized weakness, fatigue and dyspnea with exertion for about 5 weeks.  Went to see PCP and had a blood work.  She received a call from PCP stating that her hemoglobin was low.  She was advised to come to ED.  Patient denies bleeding anywhere.  She denies melena or hematochezia.  She reports taking low-dose aspirin  intermittently for headache.  She denies any other NSAID use.  She is not on anticoagulation.  Denies chest pain, palpitation, dizziness, nausea, vomiting, abdominal pain, diarrhea or UTI symptoms.  Patient lives alone at [facility].  Has rollator for mobility.  Denies smoking cigarettes, drinking alcohol or recreational drug use.  She confirms DNR.   In ED, stable vitals.  Hgb 5.5 (unknown baseline but hemoglobin 11.6 in 2023).  Per chart review, she has had blood work in 2024 and 2025 but no CBC done.  Anemia panel with severe iron deficiency.  CMP without significant finding.  Coag labs normal.  CXR with small left pleural effusion.  Hemoccult ordered and pending.  2 units of blood ordered.  Admission requested for symptomatic anemia.  [facility] consulted.   Review of Systems: As mentioned in the history of present illness. All other systems reviewed and are negative. Past Medical History:  Diagnosis Date   Hyperlipidemia    Hypothyroid    Stroke [facility])    Past Surgical  History:  Procedure Laterality Date   ABDOMINAL HYSTERECTOMY     APPENDECTOMY     CHOLECYSTECTOMY     CRANIOTOMY N/A 04/30/2018   Procedure: CRANIOTOMY HEMATOMA EVACUATION SUBDURAL;  Surgeon: Louis Shove, MD;  Location: [facility] OR;  Service: Neurosurgery;  Laterality: N/A;  suboccipital craniectomy for hematoma   HIP ARTHROPLASTY Right 10/06/2021   Procedure: ARTHROPLASTY BIPOLAR HIP (HEMIARTHROPLASTY);  Surgeon: Edna Toribio LABOR, MD;  Location: [facility] OR;  Service: Orthopedics;  Laterality: Right;   HIP ARTHROPLASTY Left 03/01/2022   Procedure: ARTHROPLASTY BIPOLAR HIP (HEMIARTHROPLASTY);  Surgeon: Edna Toribio LABOR, MD;  Location: [facility] OR;  Service: Orthopedics;  Laterality: Left;   Social History:  reports that she has been smoking cigarettes. She started smoking about 46 years ago. She has never used smokeless tobacco. She reports that she does not drink alcohol and does not use drugs.  Allergies[1]  Family History  Problem Relation Age of Onset   Diabetes Mother    Lung cancer Father    Diabetes Sister     Prior to Admission medications  Medication Sig Start Date End Date Taking? Authorizing Provider  acetaminophen  (TYLENOL ) 325 MG tablet Take 2 tablets (650 mg total) by mouth every 6 (six) hours as needed for mild pain, fever or headache. 03/03/22   Arlice Reichert, MD  amLODipine  (NORVASC ) 5 MG tablet Take 1 tablet (5 mg total) by mouth daily. Patient taking differently: Take 5 mg by mouth every morning. 10/29/21   Medina-Vargas, Monina C, NP  aspirin  EC 81 MG tablet Take 81 mg by mouth See admin instructions. Take one tablet (81 mg) by mouth every other day at 2pm. Swallow whole.    [provider]  enoxaparin  (LOVENOX ) 40 MG/0.4ML injection Inject 0.4 mLs (40 mg total) into the skin daily for 28 days. 03/04/22 04/01/22  Arlice Reichert, MD  hydrALAZINE  (APRESOLINE ) 25 MG tablet Take 1 tablet (25 mg total) by mouth every 8 (eight) hours. Patient taking differently: Take 25 mg by mouth  every 8 (eight) hours. 6am, 2pm, 10pm 10/29/21   Medina-Vargas, Monina C, NP  levothyroxine  (SYNTHROID ) 100 MCG tablet Take 100 mcg by mouth daily before breakfast. 02/07/22   [provider]  metoprolol  tartrate (LOPRESSOR ) 25 MG tablet Take 1 tablet (25 mg total) by mouth 2 (two) times daily. Patient taking differently: Take 25 mg by mouth 2 (two) times daily. 6am and 6pm 10/29/21   Medina-Vargas, Monina C, NP  Multiple Vitamin (MULTIVITAMIN WITH MINERALS) TABS tablet Take 1 tablet by mouth daily at 2 PM.    [provider]  rosuvastatin  (CRESTOR ) 5 MG tablet Take 5 mg by mouth at bedtime. 02/04/22   [provider]  senna-docusate (SENOKOT-S) 8.6-50 MG tablet Take 1 tablet by mouth 2 (two) times daily as needed for mild constipation. 10/12/21   Drusilla Sabas RAMAN, MD    Physical Exam: Vitals:   11/08/24 1332 11/08/24 1410 11/08/24 1515 11/08/24 1614  BP: (!) 178/62 (!) 165/61 (!) 147/61 (!) 173/81  Pulse: 95 85 86 81  Resp: 15 15 15 16   Temp: 98.5 F (36.9 C) 98.1 F (36.7 C)  97.8 F (36.6 C)  TempSrc: Oral Oral  Oral  SpO2: 98% 97% 98% 98%   GENERAL: No apparent distress.  Nontoxic. HEENT: MMM.  Vision and hearing grossly intact.  NECK: Supple.  No apparent JVD.  RESP:  No IWOB.  Fair aeration bilaterally. CVS:  RRR. Heart sounds normal.  ABD/GI/GU: BS+. Abd soft, NTND.  MSK/EXT:   No apparent deformity. Moves extremities. No edema.  SKIN: Scar risk skin bruises NEURO: Awake and alert. Oriented appropriately.  No apparent focal neuro deficit other than chronic ataxia. PSYCH: Calm. Normal affect.  Data Reviewed: See HPI  Assessment and Plan: Symptomatic iron deficiency anemia: Hgb 5.5.  Hgb was 11.6 in 2023.  No recent value per Care Everywhere.  Anemia panel with severe iron deficiency.  Patient denies melena or hematochezia.  Intermittently takes low-dose aspirin  as needed for headache.  Not on anticoagulation or other NSAID.  Coag labs normal.  2 units of  blood ordered in ED.  Receiving first unit. - Continue blood transfusion - Start IV Protonix 40 mg twice daily - Advised patient to stop taking aspirin . -Will give IV iron after blood transfusion. -Follow Hemoccult. - Clear liquid diet until evaluation by GI  Essential hypertension: Seems to be on amlodipine , hydralazine  and metoprolol  - Continue home metoprolol  and amlodipine . - Hold hydralazine .  History of hemorrhagic cerebral CVA Cerebellar ataxia -PT/OT eval  Hypothyroidism - Continue home Synthroid .  Hyperlipidemia - Continue home Crestor .  Anxiety: Stable.  Does not seem to be on medication.     Advance Care Planning:   Code Status: Limited: Do not attempt resuscitation (DNR) -DNR-LIMITED -Do Not Intubate/DNI  -confirmed with patient and son at bedside  Consults: [facility]  Family Communication: Updated patient's son at bedside  Severity of Illness: The appropriate patient status for this patient is OBSERVATION. Observation status is judged to be reasonable and  necessary in order to provide the required intensity of service to ensure the patient's safety. The patient's presenting symptoms, physical exam findings, and initial radiographic and laboratory data in the context of their medical condition is felt to place them at decreased risk for further clinical deterioration. Furthermore, it is anticipated that the patient will be medically stable for discharge from the hospital within 2 midnights of admission.   Author: Mignon ONEIDA Bump, MD 11/08/2024 5:13 PM  For on call review [email or web address].      [1]  Allergies Allergen Reactions   Atorvastatin Other (See Comments)    Myalgias and pruitis   Citalopram Hydrobromide Other (See Comments)    Bad dreams, confusion   Codeine Nausea Only   Fish Oil Other (See Comments)    Leg aches   Hydrochlorothiazide  Other (See Comments)    Hyponatremia   Latex Itching   Lovastatin Other (See Comments)    Muscle pain    Rosuvastatin  Other (See Comments)    Aches, but tolerating 5 mg/day in 2025   Scopolamine  Other (See Comments)    Neuro symptoms

## 2024-11-09 DIAGNOSIS — D509 Iron deficiency anemia, unspecified: Secondary | ICD-10-CM

## 2024-11-09 DIAGNOSIS — D649 Anemia, unspecified: Secondary | ICD-10-CM | POA: Diagnosis not present

## 2024-11-09 LAB — CBC
HCT: 26.3 % — ABNORMAL LOW (ref 36.0–46.0)
HCT: 32.5 % — ABNORMAL LOW (ref 36.0–46.0)
Hemoglobin: 8.1 g/dL — ABNORMAL LOW (ref 12.0–15.0)
Hemoglobin: 9.7 g/dL — ABNORMAL LOW (ref 12.0–15.0)
MCH: 22.8 pg — ABNORMAL LOW (ref 26.0–34.0)
MCH: 22.2 pg — ABNORMAL LOW (ref 26.0–34.0)
MCHC: 30.8 g/dL (ref 30.0–36.0)
MCHC: 29.8 g/dL — ABNORMAL LOW (ref 30.0–36.0)
MCV: 73.9 fL — ABNORMAL LOW (ref 80.0–100.0)
MCV: 74.5 fL — ABNORMAL LOW (ref 80.0–100.0)
Platelets: 362 K/uL (ref 150–400)
Platelets: 430 K/uL — ABNORMAL HIGH (ref 150–400)
RBC: 3.56 MIL/uL — ABNORMAL LOW (ref 3.87–5.11)
RBC: 4.36 MIL/uL (ref 3.87–5.11)
RDW: 22.5 % — ABNORMAL HIGH (ref 11.5–15.5)
RDW: 22.5 % — ABNORMAL HIGH (ref 11.5–15.5)
WBC: 5 K/uL (ref 4.0–10.5)
WBC: 6.9 K/uL (ref 4.0–10.5)
nRBC: 0 % (ref 0.0–0.2)
nRBC: 0.3 % — ABNORMAL HIGH (ref 0.0–0.2)

## 2024-11-09 LAB — RENAL FUNCTION PANEL
Albumin: 3.7 g/dL (ref 3.5–5.0)
Anion gap: 11 (ref 5–15)
BUN: 12 mg/dL (ref 8–23)
CO2: 23 mmol/L (ref 22–32)
Calcium: 8.8 mg/dL — ABNORMAL LOW (ref 8.9–10.3)
Chloride: 104 mmol/L (ref 98–111)
Creatinine, Ser: 0.76 mg/dL (ref 0.44–1.00)
GFR, Estimated: >60 mL/min (ref >60)
Glucose, Bld: 99 mg/dL (ref 70–99)
Phosphorus: 3 mg/dL (ref 2.5–4.6)
Potassium: 3.7 mmol/L (ref 3.5–5.1)
Sodium: 138 mmol/L (ref 135–145)

## 2024-11-09 LAB — OCCULT BLOOD X 1 CARD TO LAB, STOOL: Fecal Occult Bld: NEGATIVE

## 2024-11-09 LAB — MAGNESIUM: Magnesium: 2.4 mg/dL (ref 1.7–2.4)

## 2024-11-09 MED ORDER — HYDRALAZINE HCL 10 MG PO TABS
10.0000 mg | ORAL_TABLET | Freq: Once | ORAL | Status: AC
  Administered 2024-11-09: 10 mg via ORAL
  Filled 2024-11-09: qty 1

## 2024-11-09 MED ORDER — IRON SUCROSE 300 MG IVPB - SIMPLE MED
300.0000 mg | Freq: Once | Status: AC
  Administered 2024-11-09: 300 mg via INTRAVENOUS
  Filled 2024-11-09: qty 300

## 2024-11-09 MED ORDER — HYDROXYZINE HCL 25 MG PO TABS: 25.0000 mg | ORAL_TABLET | Freq: Once | ORAL | Status: DC

## 2024-11-09 NOTE — Progress Notes (Addendum)
 PT Cancellation Note  Patient Details Name: Tiffany Thornton MRN: [id number] DOB: Aug 22, 1933   Cancelled Treatment:    Reason Eval/Treat Not Completed: PT screened, no needs identified, will sign off. Pt denies need for PT services-politely declines. Will sign off.    Dannial SQUIBB, PT Acute Rehabilitation  Office: [phone]

## 2024-11-09 NOTE — TOC Initial Note (Signed)
 Transition of Care [facility]) - Initial/Assessment Note    Patient Details  Name: Tiffany Thornton MRN: [id number] Date of Birth: [birth year]  Transition of Care [facility]) CM/SW Contact:    Sonda Manuella Quill, RN Phone Number: 11/09/2024, 4:29 PM  Clinical Narrative:                 Tiffany Thornton w/ pt in room and son Tiffany Thornton [phone]) on speaker phone; pt lives at [place]; they plan for her to return at d/c; family will provide transportation; insurance/PCP verified; they denied pt experiencing SDOH risks; pt has Rollator; she does not have HH services or home oxygen; awaiting PT/OT evals; IP CM is following.  Expected Discharge Plan: Home/Self Care Barriers to Discharge: Continued Medical Work up   Patient Goals and CMS Choice Patient states their goals for this hospitalization and ongoing recovery are:: home     [place] ownership interest in [facility].provided to:: Patient    Expected Discharge Plan and Services   Discharge Planning Services: CM Consult   Living arrangements for the past 2 months: Single Family Home Expected Discharge Date: 11/09/24               DME Arranged: N/A DME Agency: NA       HH Arranged: NA HH Agency: NA        Prior Living Arrangements/Services Living arrangements for the past 2 months: Single Family Home Lives with:: Self Patient language and need for interpreter reviewed:: Yes Do you feel safe going back to the place where you live?: Yes      Need for Family Participation in Patient Care: Yes (Comment) Care giver support system in place?: Yes (comment) Current home services: DME (Rollator) Criminal Activity/Legal Involvement Pertinent to Current Situation/Hospitalization: No - Comment as needed  Activities of Daily Living   ADL Screening (condition at time of admission) Independently performs ADLs?: No Does the patient have a NEW difficulty with bathing/dressing/toileting/self-feeding that is expected to last >3 days?:  No Does the patient have a NEW difficulty with getting in/out of bed, walking, or climbing stairs that is expected to last >3 days?: No Does the patient have a NEW difficulty with communication that is expected to last >3 days?: No Is the patient deaf or have difficulty hearing?: No Does the patient have difficulty seeing, even when wearing glasses/contacts?: No Does the patient have difficulty concentrating, remembering, or making decisions?: No  Permission Sought/Granted Permission sought to share information with : Case Manager Permission granted to share information with : Yes, Verbal Permission Granted  Share Information with NAME: Case Manager     Permission granted to share info w Relationship: Tiffany Thornton (son) [phone]     Emotional Assessment Appearance:: Appears stated age Attitude/Demeanor/Rapport: Gracious Affect (typically observed): Accepting Orientation: : Oriented to Self, Oriented to Place, Oriented to  Time, Oriented to Situation Alcohol / Substance Use: Not Applicable Psych Involvement: No (comment)  Admission diagnosis:  Essential hypertension [I10] Symptomatic anemia [D64.9] Patient Active Problem List   Diagnosis Date Noted   Symptomatic anemia 11/08/2024   Protein-calorie malnutrition, severe 03/01/2022   Closed left hip fracture (HCC) 02/28/2022   Hypothyroidism 02/28/2022   Anxiety 02/28/2022   Fall at home, initial encounter 02/28/2022   Hyponatremia 10/14/2021   Postoperative anemia due to acute blood loss 10/14/2021   Intention tremor 10/14/2021   Closed right hip fracture (HCC) 10/06/2021   Cerebellar hemorrhage, nontraumatic (HCC) 05/07/2018   Ataxia, post-stroke    Essential  hypertension    Prediabetes    Hypokalemia    Dizziness and giddiness    Cognitive deficit, post-stroke    Hypertensive emergency    HLD (hyperlipidemia)    Cerebellar edema (HCC)    Leukocytosis    S/P craniotomy 04/30/2018   Left-sided nontraumatic intracerebral  hemorrhage of cerebellum (HCC) 04/30/2018   PCP:  [name], DO Pharmacy:   [facility] [phone] - [place], [place] - [address] AT [address]NER OF [facility] [address] [place] [address] Phone: [phone] Fax: [phone]     Social Drivers of Health (SDOH) Social History: SDOH Screenings   Food Insecurity: No Food Insecurity (11/09/2024)  Housing: Low Risk (11/09/2024)  Transportation Needs: No Transportation Needs (11/09/2024)  Utilities: Not At Risk (11/09/2024)  Social Connections: Moderately Integrated (11/08/2024)  Tobacco Use: High Risk (11/08/2024)   SDOH Interventions: Food Insecurity Interventions: Intervention Not Indicated, Inpatient TOC Housing Interventions: Intervention Not Indicated, Inpatient TOC Transportation Interventions: Intervention Not Indicated, Inpatient TOC Utilities Interventions: Intervention Not Indicated, Inpatient TOC   Readmission Risk Interventions     No data to display

## 2024-11-09 NOTE — Care Management Obs Status (Signed)
 MEDICARE OBSERVATION STATUS NOTIFICATION   Patient Details  Name: TAISHA PENNEBAKER MRN: [id number] Date of Birth: 1933-08-28   Medicare Observation Status Notification Given:  Yes    Sonda Manuella Quill, RN 11/09/2024, 1:35 PM

## 2024-11-09 NOTE — Discharge Summary (Signed)
 Physician Discharge Summary  Tiffany Thornton FMW:[id number] DOB: July 08, 1933 DOA: 11/08/2024  PCP: Tiffany Opal, DO  Admit date: 11/08/2024 Discharge date: 11/09/2024  Admitted From: Home Disposition: Home Recommendations for Outpatient Follow-up:  Outpatient follow-up with PCP in 1 to 2 weeks Check CMP and CBC at follow-up Please follow up on the following pending results: None  Home Health: No need identified Equipment/Devices: No new need identified  Discharge Condition: Stable CODE STATUS: DNR   Follow-up Information     Tiffany Opal, DO. Schedule an appointment as soon as possible for a visit in 1 week(s).   Specialty: Family Medicine Contact information: [address] [place] [address] [phone]                 Hospital course 89 y.o. 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  Patient had generalized weakness and dyspnea with exertion for about 5 weeks.  In ED, stable vitals.  Hgb 5.5 (was 11.6 in 2023).  Coag labs negative.  Two units of blood ordered.  Anemia panel with severe iron  deficiency.  Admission requested for symptomatic iron  deficiency anemia.  GI consulted.  The next day, hemoglobin improved to 8.1 after 2 units.  Repeat hemoglobin 9.7.  Received IV Venofer  300 mg x 1.  Hemoccult negative.  GI recommended conservative management.  Patient felt well and wanted to go home.  She is discharged home to follow-up with PCP.  Ordered outpatient iron  infusion  See individual problem list below for more.   Problems addressed during this hospitalization Symptomatic iron  deficiency anemia: Hgb 5.5.  Hgb was 11.6 in 2023.  Hemoccult negative.  Anemia panel with severe iron  deficiency.  Patient denies melena or hematochezia.  Intermittently takes low-dose aspirin  as needed for headache.  Not on  anticoagulation or other NSAID.  Coag labs normal.  Acute BV improved after 2 units and remained stable.  Received IV Venofer  300 mg x 1.  GI recommended conservative management. -Advised to stop low-dose aspirin . -IV iron  infusion outpatient - Outpatient follow-up with PCP in 1 to 2 weeks   Essential hypertension: Seems to be on amlodipine , hydralazine  and metoprolol  - Continue home meds.  History of hemorrhagic cerebral CVA Cerebellar ataxia   Hypothyroidism - Continue home Synthroid .   Hyperlipidemia - Continue home Crestor .   Anxiety: Stable.  Not on meds.  Body mass index is 18.75 kg/m.           Consultations: Gastroenterology  Time spent 35  minutes  Vital signs Vitals:   11/09/24 0055 11/09/24 0212 11/09/24 0611 11/09/24 1328  BP: (!) 157/63 (!) 144/59 (!) 154/44 (!) 159/57  Pulse:  86 (!) 54 75  Temp:  98.3 F (36.8 C)  98 F (36.7 C)  Resp:  18  18  Height:      Weight:      SpO2:  98%  96%  TempSrc:      BMI (Calculated):         Discharge exam  GENERAL: No apparent distress.  Nontoxic. HEENT: MMM.  Vision and hearing grossly intact.  NECK: Supple.  No apparent JVD.  RESP:  No IWOB.  Fair aeration bilaterally. CVS:  RRR. Heart sounds normal.  ABD/GI/GU: BS+. Abd soft, NTND.  MSK/EXT:  Moves extremities. No apparent deformity. No edema.  SKIN: no apparent skin lesion or  wound NEURO: Awake and alert. Oriented appropriately.  No apparent focal neuro deficit other than chronic cerebral ataxia. PSYCH: Calm. Normal affect.   Discharge Instructions Discharge Instructions     Amb Referral to Intravenous Iron  Therapy   Complete by: As directed    You have been referred to [facility] Infusion team for IV Iron  Infusions. The infusion pharmacy team will reach out to you with appointment information.    Primary Diagnosis Code for IV Iron : D50.9 - Iron  deficiency Anemia   Secondary diagnosis code for IV iron : Other   Comment: symptomatic anemia    Discharge instructions   Complete by: As directed    It has been a pleasure taking care of you!  You were hospitalized due to anemia/low hemoglobin for which you have been treated with blood transfusion and IV iron  infusion.  Your hemoglobin improved.  We have ordered additional iron  infusion outpatient.  Someone will contact you to arrange this infusion outpatient.  Follow-up with your primary care doctor in 1 to 2 weeks to have your hemoglobin rechecked.  Will recommend you stop taking aspirin .  Also recommend avoiding any over-the-counter pain medication other than plain Tylenol .   Take care,   Increase activity slowly   Complete by: As directed       Allergies as of 11/09/2024       Reactions   Atorvastatin Other (See Comments)   Myalgias and pruitis   Citalopram Hydrobromide Other (See Comments)   Bad dreams, confusion   Codeine Nausea Only   Fish Oil Other (See Comments)   Leg aches   Hydrochlorothiazide  Other (See Comments)   Hyponatremia   Latex Itching   Lovastatin Other (See Comments)   Muscle pain   Rosuvastatin  Other (See Comments)   Aches, but tolerating 5 mg/day in 2025   Scopolamine  Other (See Comments)   Neuro symptoms        Medication List     STOP taking these medications    aspirin  EC 81 MG tablet   enoxaparin  40 MG/0.4ML injection Commonly known as: LOVENOX        TAKE these medications    acetaminophen  325 MG tablet Commonly known as: TYLENOL  Take 2 tablets (650 mg total) by mouth every 6 (six) hours as needed for mild pain, fever or headache.   amLODipine  5 MG tablet Commonly known as: NORVASC  Take 1 tablet (5 mg total) by mouth daily. What changed: when to take this   hydrALAZINE  25 MG tablet Commonly known as: APRESOLINE  Take 1 tablet (25 mg total) by mouth every 8 (eight) hours. What changed:  when to take this additional instructions   levothyroxine  100 MCG tablet Commonly known as: SYNTHROID  Take 100 mcg by mouth  daily before breakfast.   metoprolol  tartrate 25 MG tablet Commonly known as: LOPRESSOR  Take 1 tablet (25 mg total) by mouth 2 (two) times daily. What changed:  when to take this additional instructions   multivitamin with minerals Tabs tablet Take 1 tablet by mouth daily at 2 PM.   rosuvastatin  5 MG tablet Commonly known as: CRESTOR  Take 5 mg by mouth at bedtime.   senna-docusate 8.6-50 MG tablet Commonly known as: Senokot-S Take 1 tablet by mouth 2 (two) times daily as needed for mild constipation.         Procedures/Studies:   DG Chest 2 View Result Date: 11/07/2024 CLINICAL DATA:  Dyspnea on exertion EXAM: DG CHEST 2V COMPARISON:  02/28/2022 FINDINGS: Suspicion of small left-sided pleural effusion. No focal  opacity. Normal cardiac size. Aortic atherosclerosis IMPRESSION: Suspicion of small left-sided pleural effusion. Electronically Signed   By: Luke Bun M.D.   On: 11/07/2024 17:03       The results of significant diagnostics from this hospitalization (including imaging, microbiology, ancillary and laboratory) are listed below for reference.     Microbiology: No results found for this or any previous visit (from the past 240 hours).   Labs:  CBC: Recent Labs  Lab 11/08/24 1023 11/09/24 0642 11/09/24 1450  WBC 8.4 5.0 6.9  HGB 5.5* 8.1* 9.7*  HCT 20.0* 26.3* 32.5*  MCV 69.9* 73.9* 74.5*  PLT 449* 362 430*   BMP &GFR Recent Labs  Lab 11/08/24 1023 11/09/24 0642  NA 139 138  K 4.5 3.7  CL 104 104  CO2 23 23  GLUCOSE 111* 99  BUN 16 12  CREATININE 0.87 0.76  CALCIUM  9.1 8.8*  MG  --  2.4  PHOS  --  3.0   Estimated Creatinine Clearance: 39.7 mL/min (by C-G formula based on SCr of 0.76 mg/dL). Liver & Pancreas: Recent Labs  Lab 11/08/24 1023 11/09/24 0642  AST 25  --   ALT 8  --   ALKPHOS 84  --   BILITOT 0.2  --   PROT 7.1  --   ALBUMIN  4.2 3.7   No results for input(s): LIPASE, AMYLASE in the last 168 hours. No results for  input(s): AMMONIA in the last 168 hours. Diabetic: No results for input(s): HGBA1C in the last 72 hours. No results for input(s): GLUCAP in the last 168 hours. Cardiac Enzymes: No results for input(s): CKTOTAL, CKMB, CKMBINDEX, TROPONINI in the last 168 hours. No results for input(s): PROBNP in the last 8760 hours. Coagulation Profile: Recent Labs  Lab 11/08/24 1034  INR 1.1   Thyroid  Function Tests: No results for input(s): TSH, T4TOTAL, FREET4, T3FREE, THYROIDAB in the last 72 hours. Lipid Profile: No results for input(s): CHOL, HDL, LDLCALC, TRIG, CHOLHDL, LDLDIRECT in the last 72 hours. Anemia Panel: Recent Labs    11/08/24 1040  TIBC 423  IRON  <10*   Urine analysis:    Component Value Date/Time   COLORURINE STRAW (A) 05/09/2018 2013   APPEARANCEUR CLEAR 05/09/2018 2013   LABSPEC 1.006 05/09/2018 2013   PHURINE 8.0 05/09/2018 2013   GLUCOSEU NEGATIVE 05/09/2018 2013   HGBUR NEGATIVE 05/09/2018 2013   BILIRUBINUR NEGATIVE 05/09/2018 2013   KETONESUR NEGATIVE 05/09/2018 2013   PROTEINUR NEGATIVE 05/09/2018 2013   NITRITE NEGATIVE 05/09/2018 2013   LEUKOCYTESUR NEGATIVE 05/09/2018 2013   Sepsis Labs: Invalid input(s): PROCALCITONIN, LACTICIDVEN   SIGNED:  Stela Iwasaki T Azra Abrell, MD  [facility] 11/09/2024, 3:58 PM

## 2024-11-09 NOTE — TOC Transition Note (Signed)
 Transition of Care [facility]) - Discharge Note   Patient Details  Name: Tiffany Thornton MRN: [id number] Date of Birth: 12-05-1932  Transition of Care [facility]) CM/SW Contact:  Sonda Manuella Quill, RN Phone Number: 11/09/2024, 4:35 PM   Clinical Narrative:    D/C orders received; no IP CM needs.   Final next level of care: Home/Self Care Barriers to Discharge: No Barriers Identified   Patient Goals and CMS Choice Patient states their goals for this hospitalization and ongoing recovery are:: home     [place] ownership interest in [facility].provided to:: Patient    Discharge Placement                       Discharge Plan and Services Additional resources added to the After Visit Summary for     Discharge Planning Services: CM Consult            DME Arranged: N/A DME Agency: NA       HH Arranged: NA HH Agency: NA        Social Drivers of Health (SDOH) Interventions SDOH Screenings   Food Insecurity: No Food Insecurity (11/09/2024)  Housing: Low Risk (11/09/2024)  Transportation Needs: No Transportation Needs (11/09/2024)  Utilities: Not At Risk (11/09/2024)  Social Connections: Moderately Integrated (11/08/2024)  Tobacco Use: High Risk (11/08/2024)     Readmission Risk Interventions     No data to display

## 2024-11-09 NOTE — Consult Note (Signed)
 Referring Provider: Dr. Kathrin Primary Care Physician:  [name], DO Primary Gastroenterologist:  Sampson  Reason for Consultation:  Anemia  HPI: Tiffany Thornton is a 89 y.o. female with progressive weakness and shortness of breath over the last 5 weeks.  Denies melena or hematochezia.  Denies abdominal pain.  Hemoglobin 5.5 (11.6 in 2023).  Transfused 2 units to 8.1.  Heme-negative.  Reports a colonoscopy years ago records not available at time of this dictation.  Patient states that she wants to go home today.  Past Medical History:  Diagnosis Date   Hyperlipidemia    Hypothyroid    Stroke [facility])     Past Surgical History:  Procedure Laterality Date   ABDOMINAL HYSTERECTOMY     APPENDECTOMY     CHOLECYSTECTOMY     CRANIOTOMY N/A 04/30/2018   Procedure: CRANIOTOMY HEMATOMA EVACUATION SUBDURAL;  Surgeon: Louis Shove, MD;  Location: [facility] OR;  Service: Neurosurgery;  Laterality: N/A;  suboccipital craniectomy for hematoma   HIP ARTHROPLASTY Right 10/06/2021   Procedure: ARTHROPLASTY BIPOLAR HIP (HEMIARTHROPLASTY);  Surgeon: Edna Toribio LABOR, MD;  Location: [facility] OR;  Service: Orthopedics;  Laterality: Right;   HIP ARTHROPLASTY Left 03/01/2022   Procedure: ARTHROPLASTY BIPOLAR HIP (HEMIARTHROPLASTY);  Surgeon: Edna Toribio LABOR, MD;  Location: [facility] OR;  Service: Orthopedics;  Laterality: Left;    Prior to Admission medications  Medication Sig Start Date End Date Taking? Authorizing Provider  acetaminophen  (TYLENOL ) 325 MG tablet Take 2 tablets (650 mg total) by mouth every 6 (six) hours as needed for mild pain, fever or headache. 03/03/22  Yes Dahal, Chapman, MD  amLODipine  (NORVASC ) 5 MG tablet Take 1 tablet (5 mg total) by mouth daily. Patient taking differently: Take 5 mg by mouth every morning. 10/29/21  Yes Medina-Vargas, Monina C, NP  aspirin  EC 81 MG tablet Take 81 mg by mouth 2 (two) times a week.   Yes [provider]  hydrALAZINE  (APRESOLINE ) 25 MG tablet Take 1 tablet (25  mg total) by mouth every 8 (eight) hours. Patient taking differently: Take 25 mg by mouth See admin instructions. Take 25 mg by mouth at 8 AM, 12:30 PM, and 8 PM 10/29/21  Yes Medina-Vargas, Monina C, NP  levothyroxine  (SYNTHROID ) 100 MCG tablet Take 100 mcg by mouth daily before breakfast. 02/07/22  Yes [provider]  metoprolol  tartrate (LOPRESSOR ) 25 MG tablet Take 1 tablet (25 mg total) by mouth 2 (two) times daily. Patient taking differently: Take 25 mg by mouth See admin instructions. Take 25 mg by mouth at 4 AM and 4 PM 10/29/21  Yes Medina-Vargas, Monina C, NP  Multiple Vitamin (MULTIVITAMIN WITH MINERALS) TABS tablet Take 1 tablet by mouth daily at 2 PM.   Yes [provider]  rosuvastatin  (CRESTOR ) 5 MG tablet Take 5 mg by mouth at bedtime. 02/04/22  Yes [provider]  enoxaparin  (LOVENOX ) 40 MG/0.4ML injection Inject 0.4 mLs (40 mg total) into the skin daily for 28 days. Patient not taking: Reported on 11/08/2024 03/04/22 11/08/24  Arlice Chapman, MD  senna-docusate (SENOKOT-S) 8.6-50 MG tablet Take 1 tablet by mouth 2 (two) times daily as needed for mild constipation. Patient not taking: Reported on 11/08/2024 10/12/21   Drusilla Sabas GORMAN, MD    Scheduled Meds:  amLODipine   5 mg Oral Daily   levothyroxine   100 mcg Oral QAC breakfast   metoprolol  tartrate  25 mg Oral BID   pantoprazole  (PROTONIX ) IV  40 mg Intravenous Q12H   rosuvastatin   5 mg Oral  QHS   Continuous Infusions: PRN Meds:.acetaminophen   Allergies as of 11/08/2024 - Review Complete 11/08/2024  Allergen Reaction Noted   Atorvastatin Other (See Comments) 11/08/2024   Citalopram hydrobromide Other (See Comments) 11/08/2024   Codeine Nausea Only 11/08/2024   Fish oil Other (See Comments) 11/08/2024   Hydrochlorothiazide  Other (See Comments) 10/14/2021   Latex Itching 02/28/2022   Lovastatin Other (See Comments) 11/08/2024   Rosuvastatin  Other (See Comments) 11/08/2024   Scopolamine  Other (See  Comments) 11/08/2024    Family History  Problem Relation Age of Onset   Diabetes Mother    Lung cancer Father    Diabetes Sister     Social History   Socioeconomic History   Marital status: Widowed    Spouse name: Not on file   Number of children: Not on file   Years of education: Not on file   Highest education level: Not on file  Occupational History   Not on file  Tobacco Use   Smoking status: Some Days    Types: Cigarettes    Start date: 05/07/1978   Smokeless tobacco: Never   Tobacco comments:    1 to 2 every other day  Vaping Use   Vaping status: Never Used  Substance and Sexual Activity   Alcohol use: Never   Drug use: Never   Sexual activity: Not Currently    Partners: Male  Other Topics Concern   Not on file  Social History Narrative   Pt lives alone    Social Drivers of Health   Tobacco Use: High Risk (11/08/2024)   Patient History    Smoking Tobacco Use: Some Days    Smokeless Tobacco Use: Never    Passive Exposure: Not on file  Financial Resource Strain: Not on file  Food Insecurity: No Food Insecurity (11/08/2024)   Epic    Worried About Programme Researcher, Broadcasting/film/video in the Last Year: Never true    Ran Out of Food in the Last Year: Never true  Transportation Needs: No Transportation Needs (11/08/2024)   Epic    Lack of Transportation (Medical): No    Lack of Transportation (Non-Medical): No  Physical Activity: Not on file  Stress: Not on file  Social Connections: Moderately Integrated (11/08/2024)   Social Connection and Isolation Panel    Frequency of Communication with Friends and Family: More than three times a week    Frequency of Social Gatherings with Friends and Family: Three times a week    Attends Religious Services: More than 4 times per year    Active Member of Clubs or Organizations: Yes    Attends Banker Meetings: More than 4 times per year    Marital Status: Widowed  Intimate Partner Violence: Not At Risk (11/08/2024)    Epic    Fear of Current or Ex-Partner: No    Emotionally Abused: No    Physically Abused: No    Sexually Abused: No  Depression (PHQ2-9): Not on file  Alcohol Screen: Not on file  Housing: Low Risk (11/08/2024)   Epic    Unable to Pay for Housing in the Last Year: No    Number of Times Moved in the Last Year: 0    Homeless in the Last Year: No  Utilities: Not At Risk (11/08/2024)   Epic    Threatened with loss of utilities: No  Health Literacy: Not on file    Review of Systems: All negative except as stated above in HPI.  Physical Exam: Vital signs:  Vitals:   11/09/24 0611 11/09/24 1328  BP: (!) 154/44 (!) 159/57  Pulse: (!) 54 75  Resp:  18  Temp:  98 F (36.7 C)  SpO2:  96%   Last BM Date : 11/08/24 General:   Lethargic, thin, elderly, no acute distress, pleasant  Head: normocephalic, atraumatic Eyes: anicteric sclera ENT: oropharynx clear Neck: supple, nontender Lungs:  Clear throughout to auscultation.   No wheezes, crackles, or rhonchi. No acute distress. Heart:  Regular rate and rhythm; no murmurs, clicks, rubs,  or gallops. Abdomen: Soft nontender nondistended positive bowel sounds Rectal:  Deferred Ext: no edema  GI:  Lab Results: Recent Labs    11/08/24 1023 11/09/24 0642  WBC 8.4 5.0  HGB 5.5* 8.1*  HCT 20.0* 26.3*  PLT 449* 362   BMET Recent Labs    11/08/24 1023 11/09/24 0642  NA 139 138  K 4.5 3.7  CL 104 104  CO2 23 23  GLUCOSE 111* 99  BUN 16 12  CREATININE 0.87 0.76  CALCIUM  9.1 8.8*   LFT Recent Labs    11/08/24 1023 11/09/24 0642  PROT 7.1  --   ALBUMIN  4.2 3.7  AST 25  --   ALT 8  --   ALKPHOS 84  --   BILITOT 0.2  --    PT/INR Recent Labs    11/08/24 1034  LABPROT 14.4  INR 1.1     Studies/Results: DG Chest 2 View Result Date: 11/07/2024 CLINICAL DATA:  Dyspnea on exertion EXAM: DG CHEST 2V COMPARISON:  02/28/2022 FINDINGS: Suspicion of small left-sided pleural effusion. No focal opacity. Normal cardiac  size. Aortic atherosclerosis IMPRESSION: Suspicion of small left-sided pleural effusion. Electronically Signed   By: Luke Bun M.D.   On: 11/07/2024 17:03    Impression/Plan: Symptomatic anemia without overt or occult bleeding.  Conservative management.  Hemoglobin has improved to 8.1 following transfusion.  Patient being discharged today per hospitalist and I am agreeable to that plan.  Follow-up with GI as needed.    LOS: 0 days   Jerrell JAYSON Sol  11/09/2024, 2:27 PM  Questions please call [phone]

## 2024-11-09 NOTE — Plan of Care (Signed)

## 2024-11-09 NOTE — Progress Notes (Signed)
 OT Cancellation Note  Patient Details Name: LATICIA VANNOSTRAND MRN: [id number] DOB: Jan 10, 1933   Cancelled Treatment:    Reason Eval/Treat Not Completed: OT screened, no needs identified, will sign off. Patient denies the need for therapy services.   Delanna JINNY Lesches, OTR/L 11/09/2024, 1:28 PM

## 2024-11-11 ENCOUNTER — Telehealth (HOSPITAL_COMMUNITY): Payer: Self-pay

## 2024-11-11 ENCOUNTER — Telehealth (HOSPITAL_COMMUNITY): Payer: Self-pay | Admitting: Student

## 2024-11-11 DIAGNOSIS — D508 Other iron deficiency anemias: Secondary | ICD-10-CM | POA: Insufficient documentation

## 2024-11-11 LAB — BPAM RBC
Blood Product Expiration Date: 202601042359
Blood Product Expiration Date: 202601102359
ISSUE DATE / TIME: 202512121344
ISSUE DATE / TIME: 202512121743
Unit Type and Rh: 5100
Unit Type and Rh: 5100

## 2024-11-11 LAB — TYPE AND SCREEN
ABO/RH(D): O POS
Antibody Screen: NEGATIVE
Unit division: 0
Unit division: 0

## 2024-11-11 NOTE — Telephone Encounter (Signed)
 Auth Submission: NO AUTH NEEDED Site of care: Site of care: [facility] Payer: [employer] Medicare Medication & CPT/J Code(s) submitted: Feraheme (ferumoxytol) [id number] Diagnosis Code: D50.8 Route of submission (phone, fax, portal): portal Phone # Fax # Auth type: Buy/Bill HB Units/visits requested: 510mg  x 1 dose Reference number: [id number] Approval from: 11/11/24 to 02/09/25

## 2024-11-11 NOTE — Telephone Encounter (Signed)
 Patient referred to infusion pharmacy team for ambulatory infusion of IV iron .  Insurance - [employer] Medicare  Site of care - Site of care: [facility] Dx code - D50.8  IV Iron  Therapy - Feraheme 510 mg IV x 1 Infusion appointments - Scheduling team will schedule patient as soon as possible.   Misael Mcgaha D. Jericka Kadar, PharmD

## 2024-11-29 ENCOUNTER — Ambulatory Visit (HOSPITAL_COMMUNITY)
Admission: RE | Admit: 2024-11-29 | Discharge: 2024-11-29 | Disposition: A | Discharge status: Home / Self Care | Source: Ambulatory Visit | Attending: Internal Medicine | Admitting: Internal Medicine

## 2024-11-29 VITALS — BP 153/66 | HR 70 | Temp 97.4°F | Resp 16

## 2024-11-29 DIAGNOSIS — D508 Other iron deficiency anemias: Secondary | ICD-10-CM | POA: Diagnosis present

## 2024-11-29 MED ORDER — SODIUM CHLORIDE 0.9 % IV SOLN
510.0000 mg | Freq: Once | INTRAVENOUS | Status: AC
  Administered 2024-11-29: 510 mg via INTRAVENOUS
  Filled 2024-11-29: qty 17

## 2024-11-29 NOTE — Progress Notes (Signed)
 Diagnosis:Iron  Deficiency Anemia     Provider: Tanye, Gonfa MD    Procedure: Feraheme 510 mg ( 1 OF 1)    Note: Patient received Feraheme 510 mg via PIV ( 1 of 1) , no pre meds given.  Patient tolerated well,  AVS accepted and instructions went over with patient's son whom was at chair side this visit per patient request.  Patient alert, oriented and ambulatory with walker upon discharge.
